# Patient Record
Sex: Female | Born: 1935 | Race: White | Hispanic: No | Marital: Married | Smoking: Former smoker
Health system: Southern US, Community
[De-identification: ages and names within clinical notes are randomized; demographics above are authoritative.]

## PROBLEM LIST (undated history)

## (undated) DIAGNOSIS — J449 Chronic obstructive pulmonary disease, unspecified: Secondary | ICD-10-CM

## (undated) DIAGNOSIS — R079 Chest pain, unspecified: Secondary | ICD-10-CM

## (undated) DIAGNOSIS — Z9889 Other specified postprocedural states: Secondary | ICD-10-CM

## (undated) DIAGNOSIS — I10 Essential (primary) hypertension: Secondary | ICD-10-CM

## (undated) DIAGNOSIS — I4892 Unspecified atrial flutter: Secondary | ICD-10-CM

## (undated) DIAGNOSIS — G250 Essential tremor: Secondary | ICD-10-CM

## (undated) DIAGNOSIS — J189 Pneumonia, unspecified organism: Secondary | ICD-10-CM

## (undated) DIAGNOSIS — E785 Hyperlipidemia, unspecified: Secondary | ICD-10-CM

## (undated) DIAGNOSIS — R9389 Abnormal findings on diagnostic imaging of other specified body structures: Secondary | ICD-10-CM

## (undated) DIAGNOSIS — R109 Unspecified abdominal pain: Secondary | ICD-10-CM

## (undated) DIAGNOSIS — R112 Nausea with vomiting, unspecified: Secondary | ICD-10-CM

## (undated) HISTORY — DX: Hyperlipidemia, unspecified: E78.5

## (undated) HISTORY — DX: Chronic obstructive pulmonary disease, unspecified: J44.9

## (undated) HISTORY — DX: Unspecified abdominal pain: R10.9

## (undated) HISTORY — PX: TUBAL LIGATION: SHX77

## (undated) HISTORY — DX: Abnormal findings on diagnostic imaging of other specified body structures: R93.89

## (undated) HISTORY — DX: Essential tremor: G25.0

## (undated) HISTORY — PX: EYE SURGERY: SHX253

## (undated) HISTORY — PX: TONSILLECTOMY: SUR1361

## (undated) HISTORY — PX: CATARACT EXTRACTION: SUR2

## (undated) HISTORY — DX: Chest pain, unspecified: R07.9

## (undated) HISTORY — PX: TONSILLECTOMY: SHX5217

## (undated) HISTORY — DX: Unspecified atrial flutter: I48.92

## (undated) HISTORY — DX: Essential (primary) hypertension: I10

---

## 1998-01-18 ENCOUNTER — Ambulatory Visit (HOSPITAL_COMMUNITY): Admission: RE | Admit: 1998-01-18 | Discharge: 1998-01-18 | Payer: Self-pay | Admitting: Cardiology

## 2006-05-17 ENCOUNTER — Ambulatory Visit: Payer: Self-pay | Admitting: Internal Medicine

## 2006-05-21 ENCOUNTER — Ambulatory Visit: Payer: Self-pay | Admitting: Internal Medicine

## 2006-05-23 ENCOUNTER — Ambulatory Visit: Payer: Self-pay | Admitting: Cardiology

## 2006-06-13 ENCOUNTER — Ambulatory Visit: Payer: Self-pay | Admitting: Internal Medicine

## 2006-07-27 ENCOUNTER — Other Ambulatory Visit: Admission: RE | Admit: 2006-07-27 | Discharge: 2006-07-27 | Payer: Self-pay | Admitting: Diagnostic Radiology

## 2006-10-11 ENCOUNTER — Ambulatory Visit: Payer: Self-pay | Admitting: Internal Medicine

## 2007-02-08 ENCOUNTER — Ambulatory Visit: Payer: Self-pay | Admitting: Internal Medicine

## 2007-05-24 DIAGNOSIS — I4892 Unspecified atrial flutter: Secondary | ICD-10-CM

## 2007-05-24 DIAGNOSIS — R930 Abnormal findings on diagnostic imaging of skull and head, not elsewhere classified: Secondary | ICD-10-CM | POA: Insufficient documentation

## 2007-05-24 DIAGNOSIS — J45909 Unspecified asthma, uncomplicated: Secondary | ICD-10-CM | POA: Insufficient documentation

## 2007-05-24 DIAGNOSIS — J449 Chronic obstructive pulmonary disease, unspecified: Secondary | ICD-10-CM

## 2007-07-26 ENCOUNTER — Ambulatory Visit: Payer: Self-pay | Admitting: Internal Medicine

## 2008-01-24 ENCOUNTER — Ambulatory Visit: Payer: Self-pay | Admitting: Internal Medicine

## 2008-07-22 ENCOUNTER — Ambulatory Visit: Payer: Self-pay | Admitting: Internal Medicine

## 2008-10-16 ENCOUNTER — Encounter: Payer: Self-pay | Admitting: Internal Medicine

## 2009-01-08 ENCOUNTER — Emergency Department (HOSPITAL_COMMUNITY): Admission: EM | Admit: 2009-01-08 | Discharge: 2009-01-08 | Payer: Self-pay | Admitting: Emergency Medicine

## 2009-01-17 ENCOUNTER — Ambulatory Visit: Payer: Self-pay | Admitting: Diagnostic Radiology

## 2009-01-17 ENCOUNTER — Encounter: Payer: Self-pay | Admitting: Internal Medicine

## 2009-01-18 ENCOUNTER — Inpatient Hospital Stay (HOSPITAL_COMMUNITY): Admission: EM | Admit: 2009-01-18 | Discharge: 2009-01-21 | Payer: Self-pay | Admitting: Cardiovascular Disease

## 2009-01-19 ENCOUNTER — Encounter (INDEPENDENT_AMBULATORY_CARE_PROVIDER_SITE_OTHER): Payer: Self-pay | Admitting: Cardiology

## 2009-01-19 DIAGNOSIS — I4892 Unspecified atrial flutter: Secondary | ICD-10-CM

## 2009-01-19 DIAGNOSIS — R079 Chest pain, unspecified: Secondary | ICD-10-CM

## 2009-01-19 HISTORY — DX: Chest pain, unspecified: R07.9

## 2009-01-19 HISTORY — DX: Unspecified atrial flutter: I48.92

## 2009-01-26 ENCOUNTER — Ambulatory Visit: Payer: Self-pay | Admitting: Internal Medicine

## 2009-05-12 ENCOUNTER — Telehealth: Payer: Self-pay | Admitting: Internal Medicine

## 2009-05-12 ENCOUNTER — Ambulatory Visit: Payer: Self-pay | Admitting: Internal Medicine

## 2009-05-26 ENCOUNTER — Ambulatory Visit: Payer: Self-pay | Admitting: Internal Medicine

## 2009-09-02 ENCOUNTER — Encounter: Payer: Self-pay | Admitting: Internal Medicine

## 2009-11-22 ENCOUNTER — Ambulatory Visit: Payer: Self-pay | Admitting: Internal Medicine

## 2010-04-18 ENCOUNTER — Emergency Department (HOSPITAL_COMMUNITY): Admission: EM | Admit: 2010-04-18 | Discharge: 2010-04-18 | Payer: Self-pay | Admitting: Emergency Medicine

## 2010-05-20 ENCOUNTER — Encounter: Payer: Self-pay | Admitting: Internal Medicine

## 2010-05-23 ENCOUNTER — Ambulatory Visit: Payer: Self-pay | Admitting: Internal Medicine

## 2010-05-23 DIAGNOSIS — J310 Chronic rhinitis: Secondary | ICD-10-CM

## 2010-05-30 ENCOUNTER — Ambulatory Visit: Payer: Self-pay | Admitting: Internal Medicine

## 2010-05-30 DIAGNOSIS — R0602 Shortness of breath: Secondary | ICD-10-CM

## 2010-07-04 ENCOUNTER — Ambulatory Visit: Payer: Self-pay | Admitting: Internal Medicine

## 2010-09-13 NOTE — Letter (Signed)
Summary: [facility]  [facility]   Imported By: Sherian Rein 05/30/2010 14:56:09  _____________________________________________________________________  External Attachment:    Type:   Image     Comment:   External Document

## 2010-09-13 NOTE — Assessment & Plan Note (Signed)
Summary: 6 months/apc   Primary Provider/Referring Provider:  [facility]  CC:  6 month follow up visit -COPD;SOB with activity..  History of Present Illness:  May 26, 2009- Asthma/ COPD, Hx AF Seeing Dr Caprice Kluver for AF, seeing Dr Thad Ranger neuro for tremor. We discussed lung meds- only her Proair might cause tremor, but she uses it infrequently. Denies cough, wheeze, wheeze, chest pain or sinus congestion. Little palpitation on meds. Dyspnea with housework. It helps her to use a buggy at grocery store. Had flu vax.  November 22, 2009- Asthma/ COPD, Hx AF Got through the winter wel with no Flu this year. She couldn't tolerate amiodarone, diltiazem or Multaq for PAF. She has lopressor to take as needed- not recently needed. Goes weeks without needing rescue albuterol, then may need it a couple of days in a row without relation to weather. Dyspnea isn't limiting as she paces herself. Little dry cough. Handicapped parking permanent.  May 23, 2010- Asthma/ COPD, Hx AF cc:6 month follow up visit -COPD;SOB with activity. Nose runs more with cooler weather. Denies change in persistent dyspnea with exertion , cleaning house. No different over past 6 months. Discussed pneumovax- had once 3 years ago. I suggested one booster at 5 years. Had to go to ER once Sept 1, 2011 for palpitations w/ her AF. They let her go w/out change and she has followed up with Dr little since. Had CXR at ER "fine". Had flu vax.   Asthma History    Initial Asthma Severity Rating:    Age range: 12+ years    Symptoms: 0-2 days/week    Nighttime Awakenings: 0-2/month    Interferes w/ normal activity: some limitations    SABA use (not for EIB): 0-2 days/week    Asthma Severity Assessment: Moderate Persistent   Preventive Screening-Counseling & Management  Alcohol-Tobacco     Smoking Status: quit     Year Quit: 1989     Pack years: 30  Current Medications (verified): 1)  Lopressor 50 Mg  Tabs  (Metoprolol Tartrate) .... Take 1/2  By Mouth Once Daily 2)  Adult Aspirin Low Strength 81 Mg  Tbdp (Aspirin) .... Take 1 Tablet By Mouth Once A Day 3)  Cozaar 100 Mg Tabs (Losartan Potassium) .... Take 1/2  Tablet By Mouth Once A Day 4)  Spiriva Handihaler 18 Mcg  Caps (Tiotropium Bromide Monohydrate) .... Inhale Contents of 1 Capsule Once A Day 5)  Proair Hfa 108 (90 Base) Mcg/act  Aers (Albuterol Sulfate) .Marland Kitchen.. 1-2 Puffs Every 4-6 Hours As Needed 6)  Warfarin Sodium 1 Mg Tabs (Warfarin Sodium) .... Take As Directed 7)  Clonazepam 0.5 Mg Tabs (Clonazepam) .... Take 1/2 To 1 Two Times A Day As Needed 8)  Metoprolol Tartrate 50 Mg Tabs (Metoprolol Tartrate) .... Take 1/2 By Mouth  Every Morning 9)  Alprazolam 0.25 Mg Tabs (Alprazolam) .... Take 1 By Mouth Two Times A Day For Tremors  Allergies (verified): 1)  ! Augmentin 2)  ! Codeine 3)  ! Biaxin 4)  ! Macrobid 5)  ! Crestor (Rosuvastatin Calcium) 6)  ! Amiodarone Hcl 7)  ! * Multaq 8)  ! Diltiazem Hcl Er Beads 9)  ! * Clonazepam  Past History:  Past Medical History: Last updated: 01/26/2009  ATRIAL Fib  FLUTTER, PAROXYSMAL (ICD-427.32) ABNORMAL CHEST XRAY (ICD-793.1) ASTHMA (ICD-493.90) COPD (ICD-496)  Past Surgical History: Last updated: 25-Jan-2008 Tonsillectomy cataracts tubal ligation  Family History: Last updated: 01-25-2008 Mother-deceased age 70; bone cancer Father-deceased  age 32; bone and lung caner Sister-living age 27; hypertension; high chol Sister- living age 90; breast cancer  Social History: Last updated: 01/24/2008 Retired Patient states former smoker.  Quit in 1989 Married with 3 children.  Risk Factors: Smoking Status: quit (05/23/2010)  Review of Systems      See HPI       The patient complains of shortness of breath with activity, irregular heartbeats, nasal congestion/difficulty breathing through nose, and sneezing.  The patient denies shortness of breath at rest, productive cough,  non-productive cough, coughing up blood, chest pain, acid heartburn, indigestion, loss of appetite, weight change, abdominal pain, difficulty swallowing, sore throat, tooth/dental problems, headaches, itching, ear ache, rash, and change in color of mucus.    Vital Signs:  Patient profile:   77 year old female Height:      62 inches Weight:      155.25 pounds BMI:     28.50 O2 Sat:      97 % on Room air Pulse rate:   76 / minute BP sitting:   126 / 80  (left arm) Cuff size:   regular  Vitals Entered By: Reynaldo Minium CMA (May 23, 2010 9:25 AM)  O2 Flow:  Room air CC: 6 month follow up visit -COPD;SOB with activity.   Physical Exam  Additional Exam:  General: A/Ox3; pleasant and cooperative, NAD, SKIN: no rash, lesions NODES: no lymphadenopathy HEENT: /AT, EOM- WNL, Conjuctivae- clear, PERRLA, TM-WNL, Nose- clear- external deviation to R, since childhood, Throat- clear and wnl, dentures, Mallampati II NECK: Supple w/ fair ROM, JVD- none, normal carotid impulses w/o bruits Thyroid- normal to palpation CHEST: Clear to P&A, decreased but quiet, no accessory muscles or increased effort. HEART: RRR, no m/g/r heard, pulse is regular again today ABDOMEN: Soft and nl; ZOX:WRUE, nl pulses, no edema  NEURO: little tremor today       Impression & Recommendations:  Problem # 1:  RHINITIS (ICD-472.0)  Mild rhinorhea may be seasonal allergy or vasomotor from temprature change. Will use antihistamines. External deviation from childhood injury- noted for observational comment only.  Problem # 2:  COPD (ICD-496) COPD with asthma under good control. O2 sat is 97% at rest. Dyspnea is from COPD and she is encouraged to maintain stamina through regular exercise. We will order PFT. Contrasted pulmonary and cardiac dyspnea.   Problem # 3:  ATRIAL FLUTTER, PAROXYSMAL (ICD-427.32) Sinus rhythm by exam today . We discussed cardiac vs pulmonary dyspnea clues. Her updated medication list for  this problem includes:    Lopressor 50 Mg Tabs (Metoprolol tartrate) .Marland Kitchen... Take 1/2  by mouth once daily    Adult Aspirin Low Strength 81 Mg Tbdp (Aspirin) .Marland Kitchen... Take 1 tablet by mouth once a day    Warfarin Sodium 1 Mg Tabs (Warfarin sodium) .Marland Kitchen... Take as directed    Metoprolol Tartrate 50 Mg Tabs (Metoprolol tartrate) .Marland Kitchen... Take 1/2 by mouth  every morning  Medications Added to Medication List This Visit: 1)  Warfarin Sodium 1 Mg Tabs (Warfarin sodium) .... Take as directed 2)  Metoprolol Tartrate 50 Mg Tabs (Metoprolol tartrate) .... Take 1/2 by mouth  every morning 3)  Alprazolam 0.25 Mg Tabs (Alprazolam) .... Take 1 by mouth two times a day for tremors  Other Orders: Est. Patient Level IV (45409) Misc. Referral (Misc. Ref)  Patient Instructions: 1)  Please schedule a follow-up appointment in 1 month. 2)  See [facility] to schedule PFT and 6 MWT 3)  For runny nose, try  otc antihistamine Claritin/ loratadine 4)  Med refill 5)  cc Dr Clarene Duke Prescriptions: PROAIR HFA 108 (90 BASE) MCG/ACT  AERS (ALBUTEROL SULFATE) 1-2 puffs every 4-6 hours as needed  #8.5 Gram x prn   Entered and Authorized by:   Waymon Budge MD   Signed by:   Waymon Budge MD on 05/23/2010   Method used:   Print then Give to Patient   RxID:   [id number] SPIRIVA HANDIHALER 18 MCG  CAPS (TIOTROPIUM BROMIDE MONOHYDRATE) Inhale contents of 1 capsule once a day  #30 Capsule x prn   Entered and Authorized by:   Waymon Budge MD   Signed by:   Waymon Budge MD on 05/23/2010   Method used:   Print then Give to Patient   RxID:   [id number]

## 2010-09-13 NOTE — Assessment & Plan Note (Signed)
Summary: SIX MIN WALK-PULM STRESS TEST  Nurse Visit   Vital Signs:  Patient profile:   77 year old female Pulse rate:   79 / minute BP sitting:   134 / 80  Medications Prior to Update: 1)  Lopressor 50 Mg  Tabs (Metoprolol Tartrate) .... Take 1/2  By Mouth Once Daily 2)  Adult Aspirin Low Strength 81 Mg  Tbdp (Aspirin) .... Take 1 Tablet By Mouth Once A Day 3)  Cozaar 100 Mg Tabs (Losartan Potassium) .... Take 1/2  Tablet By Mouth Once A Day 4)  Spiriva Handihaler 18 Mcg  Caps (Tiotropium Bromide Monohydrate) .... Inhale Contents of 1 Capsule Once A Day 5)  Proair Hfa 108 (90 Base) Mcg/act  Aers (Albuterol Sulfate) .Marland Kitchen.. 1-2 Puffs Every 4-6 Hours As Needed 6)  Warfarin Sodium 1 Mg Tabs (Warfarin Sodium) .... Take As Directed 7)  Clonazepam 0.5 Mg Tabs (Clonazepam) .... Take 1/2 To 1 Two Times A Day As Needed 8)  Metoprolol Tartrate 50 Mg Tabs (Metoprolol Tartrate) .... Take 1/2 By Mouth  Every Morning 9)  Alprazolam 0.25 Mg Tabs (Alprazolam) .... Take 1 By Mouth Two Times A Day For Tremors  Allergies: 1)  ! Augmentin 2)  ! Codeine 3)  ! Biaxin 4)  ! Macrobid 5)  ! Crestor (Rosuvastatin Calcium) 6)  ! Amiodarone Hcl 7)  ! * Multaq 8)  ! Diltiazem Hcl Er Beads 9)  ! * Clonazepam  Orders Added: 1)  Pulmonary Stress (6 min walk) [94620]   Six Minute Walk Test Medications taken before test(dose and time): 1)  ! Augmentin 2)  ! Codeine 3)  ! Biaxin 4)  ! Macrobid 5)  ! Crestor (Rosuvastatin Calcium) 6)  ! Amiodarone Hcl 7)  ! * Multaq 8)  ! Diltiazem Hcl Er Beads 9)  ! * Clonazepam This med list doesn't look complete- However, pt states that she took all am meds at 7:30 today- nurse will update list later.  Supplemental oxygen during the test: No  Lap counter(place a tick mark inside a square for each lap completed) lap 1 complete  lap 2 complete   lap 3 complete   lap 4 complete  lap 5 complete  lap 6 complete  lap 7 complete    Baseline  BP sitting: 134/  80 Heart rate: 79 Dyspnea ( Borg scale) 2 Fatigue (Borg scale) 0 SPO2 98  End Of Test  BP sitting: 140/ 84 Heart rate: 118 Dyspnea ( Borg scale) 4 Fatigue (Borg scale) 0 SPO2 90  2 Minutes post  BP sitting: 136/ 82 Heart rate: 85 SPO2 98  Stopped or paused before six minutes? No Other symptoms at end of exercise: Hip pain  Interpretation: Number of laps  7 X 48 meters =   336 meters =    336 meters   Total distance walked in six minutes: 336 meters  Tech ID: Tivis Ringer, CNA (May 30, 2010 12:36 PM) Tech Comments Pt completed test w/ 0 rest breaks and 1 complain: hip pain that pt states is "normal" when walking.     Appended Document: SIX MIN WALK-PULM STRESS TEST MED LIST IS COMPLETE. {USER.REALNAME}  {DATETIMESTAMP()}

## 2010-09-13 NOTE — Assessment & Plan Note (Signed)
Summary: 6 months/apc   Primary Provider/Referring Provider:  [facility]  CC:  6 month follow up visit-doing good. Marland Kitchen  History of Present Illness: 01/26/09- Asthma/ COPD Went to [facility] ER 01/08/09  for dyspnea with sat of 80%, responding to nebs. She blames Macrobid for that and off the med she has been fine. She went to [facility] ER in [place] 6/7 with "indigestion" dx'd as new onset atrial fibrillation now converted and covered with Warfarin, also Multaq and Crestor. Prior hx AFlutter without hx MI or CHF.  May 26, 2009- Asthma/ COPD, Hx AF Seeing Dr Caprice Kluver for AF, seeing Dr Thad Ranger neuro for tremor. We discussed lung meds- only her Proair might cause tremor, but she uses it infrequently. Denies cough, wheeze, wheeze, chest pain or sinus congestion. Little palpitation on meds. Dyspnea with housework. It helps her to use a buggy at grocery store. Had flu vax.  November 22, 2009- Asthma/ COPD, Hx AF Got through the winter wel with no Flu this year. She couldn't tolerate amiodarone, diltiazem or Multaq for PAF. She has lopressor to take as needed- not recently needed. Goes weeks without needing rescue albuterol, then may need it a couple of days in a row without relation to weather. Dyspnea isn't limiting as she paces herself. Little dry cough. Handicapped parking permanent.   Current Medications (verified): 1)  Paxil 20 Mg  Tabs (Paroxetine Hcl) .... Take 1 Tablet By Mouth Once A Day 2)  Lopressor 50 Mg  Tabs (Metoprolol Tartrate) .... Take 1/2  By Mouth Once Daily 3)  Adult Aspirin Low Strength 81 Mg  Tbdp (Aspirin) .... Take 1 Tablet By Mouth Once A Day 4)  Cozaar 100 Mg Tabs (Losartan Potassium) .... Take 1/2  Tablet By Mouth Once A Day 5)  Spiriva Handihaler 18 Mcg  Caps (Tiotropium Bromide Monohydrate) .... Inhale Contents of 1 Capsule Once A Day 6)  Proair Hfa 108 (90 Base) Mcg/act  Aers (Albuterol Sulfate) .Marland Kitchen.. 1-2 Puffs Every 4-6 Hours As Needed 7)  Prilosec 20  Mg Cpdr (Omeprazole) .... Take 1 By Mouth Once Daily 8)  Warfarin Sodium 1 Mg Tabs (Warfarin Sodium) .... Take 1 By Mouth Once Daily Except On Mon and Fri Take 2 9)  Clonazepam 0.5 Mg Tabs (Clonazepam) .... Take 1/2 To 1 Two Times A Day As Needed  Allergies: 1)  ! Augmentin 2)  ! Codeine 3)  ! Biaxin 4)  ! Macrobid 5)  ! Crestor (Rosuvastatin Calcium) 6)  ! Amiodarone Hcl 7)  ! * Multaq 8)  ! Diltiazem Hcl Er Beads  Past History:  Past Medical History: Last updated: 01/26/2009  ATRIAL Fib  FLUTTER, PAROXYSMAL (ICD-427.32) ABNORMAL CHEST XRAY (ICD-793.1) ASTHMA (ICD-493.90) COPD (ICD-496)  Past Surgical History: Last updated: 02-02-2008 Tonsillectomy cataracts tubal ligation  Family History: Last updated: 2008/02/02 Mother-deceased age 35; bone cancer Father-deceased age 35; bone and lung caner Sister-living age 62; hypertension; high chol Sister- living age 32; breast cancer  Social History: Last updated: February 02, 2008 Retired Patient states former smoker.  Quit in 1989 Married with 3 children.  Risk Factors: Smoking Status: quit (02-02-08)  Review of Systems      See HPI       The patient complains of dyspnea on exertion.  The patient denies anorexia, fever, weight loss, weight gain, vision loss, decreased hearing, hoarseness, chest pain, syncope, peripheral edema, prolonged cough, headaches, hemoptysis, abdominal pain, and severe indigestion/heartburn.    Vital Signs:  Patient profile:   77  year old female Height:      62 inches Weight:      160 pounds BMI:     29.37 O2 Sat:      96 % on Room air Pulse rate:   70 / minute BP sitting:   146 / 84  (left arm) Cuff size:   regular  Vitals Entered By: Reynaldo Minium CMA (November 22, 2009 9:24 AM)  O2 Flow:  Room air  Physical Exam  Additional Exam:  General: A/Ox3; pleasant and cooperative, NAD, SKIN: no rash, lesions NODES: no lymphadenopathy HEENT: [place]/AT, EOM- WNL, Conjuctivae- clear, PERRLA, TM-WNL,  Nose- clear, Throat- clear and wnl, dentures, Mallampati II NECK: Supple w/ fair ROM, JVD- none, normal carotid impulses w/o bruits Thyroid- normal to palpation CHEST: Clear to P&A, decreased but quiet, no accessory muscles or increased effort. HEART: RRR, no m/g/r heard, pulse is regular today ABDOMEN: Soft and nl; ZOX:WRUE, nl pulses, no edema  NEURO: little tremor today      Impression & Recommendations:  Problem # 1:  COPD (ICD-496) Good control with no acute issues this spring so far. Pollen isn't bothering her.  Problem # 2:  ABNORMAL CHEST XRAY (ICD-793.1)  CXR 01/17/09 had shown probable scaring in RUL, NAD. We agreed to wait and repeat at next visit.  Orders: Est. Patient Level III (45409)  Medications Added to Medication List This Visit: 1)  Lopressor 50 Mg Tabs (Metoprolol tartrate) .... Take 1/2  by mouth once daily  Patient Instructions: 1)  Please schedule a follow-up appointment in 6 months. 2)  Please call earlier if needed. 3)  Handicapped parking

## 2010-09-13 NOTE — Miscellaneous (Signed)
Summary: Orders Update pft charges  Clinical Lists Changes  Orders: Added new Service order of Carbon Monoxide diffusing w/capacity (94720) - Signed Added new Service order of Lung Volumes (94240) - Signed Added new Service order of Spirometry (Pre & Post) (94060) - Signed 

## 2010-09-13 NOTE — Letter (Signed)
Summary: [facility]  [facility]   Imported By: Sherian Rein 09/13/2009 08:17:42  _____________________________________________________________________  External Attachment:    Type:   Image     Comment:   External Document

## 2010-09-13 NOTE — Assessment & Plan Note (Signed)
Summary: 1 MONTH/ MBW   Primary Provider/Referring Provider:  [facility]  CC:  1 month follow up visit-COPD; review PFT and results..  History of Present Illness:  November 22, 2009- Asthma/ COPD, Hx AF Got through the winter wel with no Flu this year. She couldn't tolerate amiodarone, diltiazem or Multaq for PAF. She has lopressor to take as needed- not recently needed. Goes weeks without needing rescue albuterol, then may need it a couple of days in a row without relation to weather. Dyspnea isn't limiting as she paces herself. Little dry cough. Handicapped parking permanent.  May 23, 2010- Asthma/ COPD, Hx AF cc:6 month follow up visit -COPD;SOB with activity. Nose runs more with cooler weather. Denies change in persistent dyspnea with exertion , cleaning house. No different over past 6 months. Discussed pneumovax- had once 3 years ago. I suggested one booster at 5 years. Had to go to ER once Sept 1, 2011 for palpitations w/ her AF. They let her go w/out change and she has followed up with Dr little since. Had CXR at ER "fine". Had flu vax.  July 04, 2010-  Asthma/ COPD, Hx AF Nurse-CC: 1 month follow up visit-COPD; review PFT and results. Had flu vax. Deneies acute events or changes since last here.  PFT- Moderate obstruction, air trapping, severley reduced DLCO, slight response to dilator. FEV1 1.00/ 0.57; FVC 1.89/ 0.76; RV 135%; DLCO 42% 6 MWT- 98%, 90%, 98% 336 m  Last CXR showed scarring but no interstitial disease to reduce diffusion.    Preventive Screening-Counseling & Management  Alcohol-Tobacco     Smoking Status: quit     Year Quit: 1989     Pack years: 30  Current Medications (verified): 1)  Lopressor 50 Mg  Tabs (Metoprolol Tartrate) .... Take 1/2  By Mouth Once Daily 2)  Adult Aspirin Low Strength 81 Mg  Tbdp (Aspirin) .... Take 1 Tablet By Mouth Once A Day 3)  Cozaar 100 Mg Tabs (Losartan Potassium) .... Take 1/2  Tablet By Mouth Once  A Day 4)  Spiriva Handihaler 18 Mcg  Caps (Tiotropium Bromide Monohydrate) .... Inhale Contents of 1 Capsule Once A Day 5)  Proair Hfa 108 (90 Base) Mcg/act  Aers (Albuterol Sulfate) .Marland Kitchen.. 1-2 Puffs Every 4-6 Hours As Needed 6)  Warfarin Sodium 1 Mg Tabs (Warfarin Sodium) .... Take As Directed 7)  Metoprolol Tartrate 50 Mg Tabs (Metoprolol Tartrate) .... Take 1/2 By Mouth  Every Morning 8)  Alprazolam 0.25 Mg Tabs (Alprazolam) .... Take 1 By Mouth Two Times A Day For Tremors  Allergies (verified): 1)  ! Augmentin 2)  ! Codeine 3)  ! Biaxin 4)  ! Macrobid 5)  ! Crestor (Rosuvastatin Calcium) 6)  ! Amiodarone Hcl 7)  ! * Multaq 8)  ! Diltiazem Hcl Er Beads 9)  ! * Clonazepam  Past History:  Past Surgical History: Last updated: January 28, 2008 Tonsillectomy cataracts tubal ligation  Family History: Last updated: 2008/01/28 Mother-deceased age 70; bone cancer Father-deceased age 35; bone and lung caner Sister-living age 55; hypertension; high chol Sister- living age 60; breast cancer  Social History: Last updated: 01/28/08 Retired Patient states former smoker.  Quit in 1989 Married with 3 children.  Risk Factors: Smoking Status: quit (07/04/2010)  Past Medical History:  ATRIAL Fib  FLUTTER, PAROXYSMAL (ICD-427.32) ABNORMAL CHEST XRAY (ICD-793.1) COPD (ICD-496) PFT 05/30/10- FEV1 1.00/ 0.57; FVC 1.89/ 0.76; R 0.53; RV 135%; DLCO 42%  Review of Systems  See HPI       The patient complains of shortness of breath at rest.  The patient denies shortness of breath with activity, productive cough, non-productive cough, coughing up blood, chest pain, irregular heartbeats, acid heartburn, indigestion, loss of appetite, weight change, abdominal pain, difficulty swallowing, sore throat, tooth/dental problems, headaches, nasal congestion/difficulty breathing through nose, sneezing, itching, ear ache, hand/feet swelling, rash, and change in color of mucus.    Vital  Signs:  Patient profile:   77 year old female Height:      62 inches Weight:      158.38 pounds BMI:     29.07 O2 Sat:      93 % on Room air Pulse rate:   104 / minute BP sitting:   180 / 88  (left arm) Cuff size:   regular  Vitals Entered By: Reynaldo Minium CMA (July 04, 2010 9:33 AM)  O2 Flow:  Room air CC: 1 month follow up visit-COPD; review PFT and results.   Physical Exam  Additional Exam:  General: A/Ox3; pleasant and cooperative, NAD, SKIN: no rash, lesions NODES: no lymphadenopathy HEENT: [place]/AT, EOM- WNL, Conjuctivae- clear, PERRLA, TM-WNL, Nose- clear- external deviation to R, since childhood, Throat- clear and wnl, dentures, Mallampati II NECK: Supple w/ fair ROM, JVD- none, normal carotid impulses w/o bruits Thyroid- normal to palpation CHEST: Clear to P&A, decreased but quiet, no accessory muscles or increased effort. HEART: RRR, no m/g/r heard, pulse is regular again today ABDOMEN: Soft and nl; YWV:PXTG, nl pulses, no edema  NEURO: little tremor today       Impression & Recommendations:  Problem # 1:  COPD (ICD-496) Moderately severe COPD, mostly emphysema pattern. Minimal reversibility . Already on Spiriva. We can encourage walking. She has had flu vax. Has had the pneumovax in past. She expects to pace hersef. Eventually she may want to consider portable oxygen, but not needed yet.  I've encouraged regular walking as tolerated.  We went over the PFT numbers together.   Problem # 2:  ATRIAL FLUTTER, PAROXYSMAL (ICD-427.32)  She has been intolerant to some meds tried by Dr Clarene Duke. We discussed potential effect of her cardiac meds on breathing, but I don't see problems now.  Her updated medication list for this problem includes:    Lopressor 50 Mg Tabs (Metoprolol tartrate) .Marland Kitchen... Take 1/2  by mouth once daily    Adult Aspirin Low Strength 81 Mg Tbdp (Aspirin) .Marland Kitchen... Take 1 tablet by mouth once a day    Warfarin Sodium 1 Mg Tabs (Warfarin sodium) .Marland Kitchen...  Take as directed    Metoprolol Tartrate 50 Mg Tabs (Metoprolol tartrate) .Marland Kitchen... Take 1/2 by mouth  every morning  Orders: Est. Patient Level IV (62694)  Patient Instructions: 1)  Please schedule a follow-up appointment in 6 months. 2)  Continue Spiriva

## 2010-09-18 ENCOUNTER — Emergency Department (HOSPITAL_COMMUNITY)
Admission: EM | Admit: 2010-09-18 | Discharge: 2010-09-18 | Disposition: A | Discharge status: Home / Self Care | Payer: Medicare Other | Attending: Emergency Medicine | Admitting: Emergency Medicine

## 2010-09-18 ENCOUNTER — Encounter (HOSPITAL_COMMUNITY): Payer: Self-pay | Admitting: Radiology

## 2010-09-18 ENCOUNTER — Emergency Department (HOSPITAL_COMMUNITY): Payer: Medicare Other

## 2010-09-18 DIAGNOSIS — R000 Tachycardia, unspecified: Secondary | ICD-10-CM | POA: Insufficient documentation

## 2010-09-18 DIAGNOSIS — E7800 Pure hypercholesterolemia, unspecified: Secondary | ICD-10-CM | POA: Insufficient documentation

## 2010-09-18 DIAGNOSIS — I10 Essential (primary) hypertension: Secondary | ICD-10-CM | POA: Insufficient documentation

## 2010-09-18 DIAGNOSIS — J449 Chronic obstructive pulmonary disease, unspecified: Secondary | ICD-10-CM | POA: Insufficient documentation

## 2010-09-18 DIAGNOSIS — Z79899 Other long term (current) drug therapy: Secondary | ICD-10-CM | POA: Insufficient documentation

## 2010-09-18 DIAGNOSIS — I4891 Unspecified atrial fibrillation: Secondary | ICD-10-CM | POA: Insufficient documentation

## 2010-09-18 DIAGNOSIS — Z7982 Long term (current) use of aspirin: Secondary | ICD-10-CM | POA: Insufficient documentation

## 2010-09-18 DIAGNOSIS — J4489 Other specified chronic obstructive pulmonary disease: Secondary | ICD-10-CM | POA: Insufficient documentation

## 2010-09-18 DIAGNOSIS — R002 Palpitations: Secondary | ICD-10-CM | POA: Insufficient documentation

## 2010-09-18 LAB — DIFFERENTIAL
Basophils Absolute: 0.1 10*3/uL (ref 0.0–0.1)
Basophils Relative: 1 % (ref 0–1)
Eosinophils Absolute: 0.1 10*3/uL (ref 0.0–0.7)
Monocytes Relative: 8 % (ref 3–12)
Neutro Abs: 4.3 10*3/uL (ref 1.7–7.7)
Neutrophils Relative %: 52 % (ref 43–77)

## 2010-09-18 LAB — BASIC METABOLIC PANEL
CO2: 25 mEq/L (ref 19–32)
Chloride: 104 mEq/L (ref 96–112)
GFR calc Af Amer: 51 mL/min — ABNORMAL LOW (ref >60)
Potassium: 3.7 mEq/L (ref 3.5–5.1)
Sodium: 140 mEq/L (ref 135–145)

## 2010-09-18 LAB — CBC
Hemoglobin: 13.9 g/dL (ref 12.0–15.0)
Platelets: 196 10*3/uL (ref 150–400)
RBC: 5.04 MIL/uL (ref 3.87–5.11)
WBC: 8.3 10*3/uL (ref 4.0–10.5)

## 2010-09-18 LAB — MAGNESIUM: Magnesium: 2.3 mg/dL (ref 1.5–2.5)

## 2010-09-18 LAB — POCT CARDIAC MARKERS
Myoglobin, poc: 80.8 ng/mL (ref 12–200)
Troponin i, poc: <0.05 ng/mL (ref 0.00–0.09)
Troponin i, poc: <0.05 ng/mL (ref 0.00–0.09)

## 2010-09-18 LAB — CK TOTAL AND CKMB (NOT AT ARMC)
Relative Index: INVALID (ref 0.0–2.5)
Total CK: 82 U/L (ref 7–177)

## 2010-09-18 LAB — TROPONIN I: Troponin I: 0.03 ng/mL (ref 0.00–0.06)

## 2010-09-18 LAB — PROTIME-INR: Prothrombin Time: 22.5 seconds — ABNORMAL HIGH (ref 11.6–15.2)

## 2010-10-27 LAB — POCT CARDIAC MARKERS
CKMB, poc: <1 ng/mL — ABNORMAL LOW (ref 1.0–8.0)
CKMB, poc: <1 ng/mL — ABNORMAL LOW (ref 1.0–8.0)
Myoglobin, poc: 58.8 ng/mL (ref 12–200)
Myoglobin, poc: 70.3 ng/mL (ref 12–200)
Troponin i, poc: <0.05 ng/mL (ref 0.00–0.09)
Troponin i, poc: <0.05 ng/mL (ref 0.00–0.09)

## 2010-10-27 LAB — CBC
HCT: 41.7 % (ref 36.0–46.0)
Hemoglobin: 13.8 g/dL (ref 12.0–15.0)
MCH: 28 pg (ref 26.0–34.0)
MCHC: 33.1 g/dL (ref 30.0–36.0)
MCV: 84.6 fL (ref 78.0–100.0)
Platelets: 161 10*3/uL (ref 150–400)
RBC: 4.93 MIL/uL (ref 3.87–5.11)
RDW: 14.1 % (ref 11.5–15.5)
WBC: 5.8 10*3/uL (ref 4.0–10.5)

## 2010-10-27 LAB — BASIC METABOLIC PANEL
CO2: 30 mEq/L (ref 19–32)
Chloride: 104 mEq/L (ref 96–112)
GFR calc Af Amer: 51 mL/min — ABNORMAL LOW (ref >60)
Glucose, Bld: 114 mg/dL — ABNORMAL HIGH (ref 70–99)
Sodium: 141 mEq/L (ref 135–145)

## 2010-10-27 LAB — BASIC METABOLIC PANEL WITH GFR
BUN: 11 mg/dL (ref 6–23)
Calcium: 9.4 mg/dL (ref 8.4–10.5)
Creatinine, Ser: 1.24 mg/dL — ABNORMAL HIGH (ref 0.4–1.2)
GFR calc non Af Amer: 42 mL/min — ABNORMAL LOW (ref >60)
Potassium: 4.1 meq/L (ref 3.5–5.1)

## 2010-10-27 LAB — DIFFERENTIAL
Basophils Absolute: 0.1 10*3/uL (ref 0.0–0.1)
Basophils Relative: 1 % (ref 0–1)
Eosinophils Absolute: 0.1 K/uL (ref 0.0–0.7)
Eosinophils Relative: 2 % (ref 0–5)
Lymphocytes Relative: 29 % (ref 12–46)
Lymphs Abs: 1.7 K/uL (ref 0.7–4.0)
Monocytes Absolute: 0.4 10*3/uL (ref 0.1–1.0)
Monocytes Relative: 7 % (ref 3–12)
Neutro Abs: 3.6 10*3/uL (ref 1.7–7.7)
Neutrophils Relative %: 61 % (ref 43–77)

## 2010-10-27 LAB — PROTIME-INR
INR: 2.26 — ABNORMAL HIGH (ref 0.00–1.49)
Prothrombin Time: 25.1 seconds — ABNORMAL HIGH (ref 11.6–15.2)

## 2010-11-21 LAB — POCT CARDIAC MARKERS
CKMB, poc: <1 ng/mL — ABNORMAL LOW (ref 1.0–8.0)
CKMB, poc: <1 ng/mL — ABNORMAL LOW (ref 1.0–8.0)
Myoglobin, poc: 46.6 ng/mL (ref 12–200)
Troponin i, poc: <0.05 ng/mL (ref 0.00–0.09)

## 2010-11-21 LAB — URINE CULTURE: Colony Count: 35000

## 2010-11-21 LAB — BASIC METABOLIC PANEL
BUN: 16 mg/dL (ref 6–23)
CO2: 27 mEq/L (ref 19–32)
Calcium: 9 mg/dL (ref 8.4–10.5)
Chloride: 109 mEq/L (ref 96–112)
GFR calc Af Amer: 45 mL/min — ABNORMAL LOW (ref >60)
GFR calc non Af Amer: 37 mL/min — ABNORMAL LOW (ref >60)
GFR calc non Af Amer: 41 mL/min — ABNORMAL LOW (ref >60)
Glucose, Bld: 103 mg/dL — ABNORMAL HIGH (ref 70–99)
Glucose, Bld: 96 mg/dL (ref 70–99)
Potassium: 4.7 mEq/L (ref 3.5–5.1)
Potassium: 5.4 mEq/L — ABNORMAL HIGH (ref 3.5–5.1)
Sodium: 140 mEq/L (ref 135–145)
Sodium: 142 mEq/L (ref 135–145)

## 2010-11-21 LAB — CARDIAC PANEL(CRET KIN+CKTOT+MB+TROPI)
CK, MB: 1.5 ng/mL (ref 0.3–4.0)
CK, MB: 1.9 ng/mL (ref 0.3–4.0)
Relative Index: INVALID (ref 0.0–2.5)
Relative Index: INVALID (ref 0.0–2.5)
Total CK: 58 U/L (ref 7–177)
Total CK: 61 U/L (ref 7–177)
Troponin I: 0.01 ng/mL (ref 0.00–0.06)
Troponin I: 0.01 ng/mL (ref 0.00–0.06)
Troponin I: 0.02 ng/mL (ref 0.00–0.06)

## 2010-11-21 LAB — CBC
HCT: 35.3 % — ABNORMAL LOW (ref 36.0–46.0)
HCT: 35.4 % — ABNORMAL LOW (ref 36.0–46.0)
HCT: 35.6 % — ABNORMAL LOW (ref 36.0–46.0)
Hemoglobin: 11.8 g/dL — ABNORMAL LOW (ref 12.0–15.0)
Hemoglobin: 11.9 g/dL — ABNORMAL LOW (ref 12.0–15.0)
MCHC: 33.5 g/dL (ref 30.0–36.0)
MCV: 84.4 fL (ref 78.0–100.0)
Platelets: 143 10*3/uL — ABNORMAL LOW (ref 150–400)
Platelets: 145 10*3/uL — ABNORMAL LOW (ref 150–400)
Platelets: 182 10*3/uL (ref 150–400)
RBC: 4.17 MIL/uL (ref 3.87–5.11)
RDW: 14.8 % (ref 11.5–15.5)
RDW: 15.3 % (ref 11.5–15.5)
RDW: 15.4 % (ref 11.5–15.5)
WBC: 6.6 10*3/uL (ref 4.0–10.5)
WBC: 6.8 10*3/uL (ref 4.0–10.5)
WBC: 9.4 10*3/uL (ref 4.0–10.5)

## 2010-11-21 LAB — COMPREHENSIVE METABOLIC PANEL
AST: 21 U/L (ref 0–37)
Albumin: 3 g/dL — ABNORMAL LOW (ref 3.5–5.2)
Albumin: 3.4 g/dL — ABNORMAL LOW (ref 3.5–5.2)
Alkaline Phosphatase: 95 U/L (ref 39–117)
BUN: 16 mg/dL (ref 6–23)
Calcium: 8.7 mg/dL (ref 8.4–10.5)
Chloride: 108 mEq/L (ref 96–112)
Chloride: 105 mEq/L (ref 96–112)
Creatinine, Ser: 1.2 mg/dL (ref 0.4–1.2)
GFR calc Af Amer: 53 mL/min — ABNORMAL LOW (ref >60)
GFR calc Af Amer: 53 mL/min — ABNORMAL LOW (ref >60)
Potassium: 4.1 mEq/L (ref 3.5–5.1)
Sodium: 139 mEq/L (ref 135–145)
Total Bilirubin: 0.4 mg/dL (ref 0.3–1.2)
Total Bilirubin: 0.4 mg/dL (ref 0.3–1.2)

## 2010-11-21 LAB — DIFFERENTIAL
Basophils Absolute: 0.3 10*3/uL — ABNORMAL HIGH (ref 0.0–0.1)
Basophils Relative: 3 % — ABNORMAL HIGH (ref 0–1)
Eosinophils Relative: 2 % (ref 0–5)
Monocytes Absolute: 0.8 10*3/uL (ref 0.1–1.0)
Monocytes Relative: 9 % (ref 3–12)

## 2010-11-21 LAB — URINE MICROSCOPIC-ADD ON

## 2010-11-21 LAB — PROTIME-INR
INR: 1 (ref 0.00–1.49)
Prothrombin Time: 13.9 seconds (ref 11.6–15.2)

## 2010-11-21 LAB — HEMOGLOBIN A1C: Mean Plasma Glucose: 103 mg/dL

## 2010-11-21 LAB — URINALYSIS, ROUTINE W REFLEX MICROSCOPIC
Glucose, UA: NEGATIVE mg/dL
Ketones, ur: NEGATIVE mg/dL
Specific Gravity, Urine: 1.017 (ref 1.005–1.030)
pH: 5.5 (ref 5.0–8.0)

## 2010-11-21 LAB — HEMOCCULT GUIAC POC 1CARD (OFFICE): Fecal Occult Bld: NEGATIVE

## 2010-11-21 LAB — LIPASE, BLOOD: Lipase: 17 U/L (ref 11–59)

## 2010-11-21 LAB — HEPARIN LEVEL (UNFRACTIONATED): Heparin Unfractionated: 1.62 IU/mL — ABNORMAL HIGH (ref 0.30–0.70)

## 2010-11-21 LAB — AMYLASE: Amylase: 62 U/L (ref 27–131)

## 2010-11-22 LAB — DIFFERENTIAL
Basophils Absolute: 0 10*3/uL (ref 0.0–0.1)
Eosinophils Relative: 2 % (ref 0–5)
Lymphocytes Relative: 7 % — ABNORMAL LOW (ref 12–46)
Lymphs Abs: 0.7 10*3/uL (ref 0.7–4.0)
Monocytes Absolute: 0.6 10*3/uL (ref 0.1–1.0)
Monocytes Relative: 6 % (ref 3–12)
Neutro Abs: 8.6 10*3/uL — ABNORMAL HIGH (ref 1.7–7.7)

## 2010-11-22 LAB — BASIC METABOLIC PANEL
Calcium: 9.5 mg/dL (ref 8.4–10.5)
GFR calc Af Amer: 54 mL/min — ABNORMAL LOW (ref >60)
GFR calc non Af Amer: 44 mL/min — ABNORMAL LOW (ref >60)
Glucose, Bld: 131 mg/dL — ABNORMAL HIGH (ref 70–99)
Potassium: 5.3 mEq/L — ABNORMAL HIGH (ref 3.5–5.1)
Sodium: 137 mEq/L (ref 135–145)

## 2010-11-22 LAB — CBC
HCT: 41 % (ref 36.0–46.0)
Hemoglobin: 14 g/dL (ref 12.0–15.0)
RBC: 4.85 MIL/uL (ref 3.87–5.11)
WBC: 10.2 10*3/uL (ref 4.0–10.5)

## 2010-11-22 LAB — POCT CARDIAC MARKERS

## 2010-12-23 ENCOUNTER — Encounter: Payer: Self-pay | Admitting: Internal Medicine

## 2010-12-26 ENCOUNTER — Ambulatory Visit (INDEPENDENT_AMBULATORY_CARE_PROVIDER_SITE_OTHER): Payer: Medicare Other | Admitting: Internal Medicine

## 2010-12-26 ENCOUNTER — Encounter: Payer: Self-pay | Admitting: Internal Medicine

## 2010-12-26 VITALS — BP 126/82 | HR 55 | Ht 62.0 in | Wt 146.8 lb

## 2010-12-26 DIAGNOSIS — J4489 Other specified chronic obstructive pulmonary disease: Secondary | ICD-10-CM

## 2010-12-26 DIAGNOSIS — R930 Abnormal findings on diagnostic imaging of skull and head, not elsewhere classified: Secondary | ICD-10-CM

## 2010-12-26 DIAGNOSIS — J449 Chronic obstructive pulmonary disease, unspecified: Secondary | ICD-10-CM

## 2010-12-26 NOTE — Patient Instructions (Signed)
Sample Spiriva- continue as before  Call for refills as needed.

## 2010-12-26 NOTE — Progress Notes (Signed)
Subjective:     Patient ID: Claudia [name], female   DOB: 20-Feb-1933, 77 y.o.   MRN: [id number]  HPI 12/26/10- 77 yoF follwed for COPD/ asthma, rhinitis,  complicated by atrial flutter. Last here July 04, 2010 when we reviewed PFT with moderate obstruction, normal .  Last CXR was at hospital in Feb for atrial fib, showing CE, COPD, NAD.  Breathing was "rough" with increased shortness of breath w/o wheeze in March. Nose was running. She doesn't know if it was pollen or something about a heart med, but it got better. Throat feels full, lying in bed at night. Admits heartburn.Short of breath with steady walking and stairs. Spiriva helps.   Review of Systems Constitutional:   No weight loss, night sweats,  Fevers, chills, fatigue, lassitude. HEENT:   No headaches,  Difficulty swallowing,  Tooth/dental problems,  Sore throat,                No sneezing, itching, ear ache, nasal congestion, post nasal drip,   CV:  No chest pain,  Orthopnea, PND, swelling in lower extremities, anasarca, dizziness, palpitations  GI  No abdominal pain, nausea, vomiting, diarrhea, change in bowel habits, loss of appetite  Resp:No excess mucus, no productive cough,  No non-productive cough,  No coughing up of blood.  No change in color of mucus.  No wheezing.   Skin: no rash or lesions.  GU: no dysuria, change in color of urine, no urgency or frequency.  No flank pain.  MS:  No joint pain or swelling.  No decreased range of motion.  No back pain.  Psych:  No change in mood or affect. No depression or anxiety.  No memory loss.      Objective:   Physical Exam General- Alert, Oriented, Affect-appropriate, Distress- none acute  Skin- rash-none, lesions- none, excoriation- none  Lymphadenopathy- none  Head- atraumatic  Eyes- Gross vision intact, PERRLA, conjunctivae clear, secretions  Ears- Hearing, canals, - normal,  Nose- Clear,  No- eptal dev, mucus, polyps, erosion, perforation   Throat-  Mallampati II , mucosa clear , drainage- none, tonsils- atrophic  Neck- flexible , trachea midline, no stridor , thyroid nl, carotid no bruit  Chest - symmetrical excursion , unlabored     Heart/CV- RRR , no murmur , no gallop  , no rub, nl s1 s2                     - JVD- none , edema- none, stasis changes- none, varices- none     Lung- clear to P&A- distant,     wheeze- none, cough- none , dullness-none, rub- none     Chest wall-  Abd- tender-no, distended-no, bowel sounds-present, HSM- no  Br/ Gen/ Rectal- Not done, not indicated  Extrem- cyanosis- none, clubbing, none, atrophy- none, strength- nl  Neuro- grossly intact to observation   Resting tremor      Assessment:         Plan:

## 2010-12-26 NOTE — Assessment & Plan Note (Signed)
Stable. She sees the benefit of Spiriva but cost is a factor. With the problem of heart rhythm, she needs to stay away from albuterol products.

## 2010-12-26 NOTE — Assessment & Plan Note (Signed)
No active issue of concern. I will resolve this problem

## 2010-12-27 NOTE — Assessment & Plan Note (Signed)
[facility]                             PULMONARY OFFICE NOTE   NAME:Claudia [name], Claudia [name]                       MRN:          [id number]  DATE:02/08/2007                            DOB:          12-Jan-1933    PROBLEMS:  1. Asthma/chronic obstructive pulmonary disease.  2. Abnormal chest x-ray.  3. Paroxysmal atrial flutter.   HISTORY:  Scheduled follow up, doing fine. She could not tolerate  Asmanex blamed for dizziness and nausea. She had follow up of thyroid  and mammogram abnormalities and understands that everything checked out  well. Medication list is reviewed, unchanged. She is off of Lisinopril,  using Cozaar 50 mg. She is drug intolerant of CODEINE and AUGMENTIN.   OBJECTIVE:  Weight 161 pounds, blood pressure 154/84, pulse 62, room air  saturation 95%. Breath sounds are diminished and quiet. Heart sounds are  regular. There is no edema or adenopathy.   IMPRESSION:  Asthma/chronic obstructive pulmonary disease currently  stable.   PLAN:  She is off of Asmanex and we will leave her off of an inhaled  steroid for now. Continue Spiriva. Chest x-ray. Schedule return 6  months, earlier p.r.n.     Clinton D. Maple Hudson, MD, Tonny Bollman, FACP  Electronically Signed    CDY/MedQ  DD: 02/10/2007  DT: 02/11/2007  Job #: [id number]   cc:   Gabriel Earing, M.D.  Thereasa Solo. Little, M.D.

## 2010-12-27 NOTE — Discharge Summary (Signed)
NAMEMADHURI, VACCA                ACCOUNT NO.:  [id number]   MEDICAL RECORD NO.:  [id number]          PATIENT TYPE:  INP   LOCATION:  4738                         FACILITY:  [facility]   PHYSICIAN:  Thereasa Solo. Little, M.D. DATE OF BIRTH:  25-Jan-1933   DATE OF ADMISSION:  01/18/2009  DATE OF DISCHARGE:  01/21/2009                               DISCHARGE SUMMARY   DISCHARGE DIAGNOSES:  1. Chest pain, myocardial infarction ruled out, a low-risk Myoview      this admission.  2. Paroxysmal atrial fibrillation this admission, initially put on      amiodarone but changed to Multaq at discharge because of chronic      obstructive pulmonary disease.  3. Chronic obstructive pulmonary disease and asthma.  4. Treated dyslipidemia, Zocor was changed to Crestor this admission.  5. Hypertension.  6. History of tremor.   HOSPITAL COURSE:  The patient is a pleasant 77 year old female who had  past history of PAF.  She had a Myoview a couple of years ago that was  negative for ischemia.  She says Dr. Clarene Duke put her on Paxil and Xanax  when she had some irregular heart rhythm in the past and this settled  things down.  She was admitted on January 18, 2009, with indigestion-type  chest pain.  Symptoms were worrisome for unstable angina.  She was  admitted to telemetry.  CK-MB and troponins were negative.  She was put  on IV heparin, aspirin, and continued on her beta-blocker.  She did go  into atrial fibrillation after admission with rapid ventricular  response.  She converted with IV amiodarone.  TSH was within normal  limits.  She was set up for a [employer] on January 19, 2009.  This  was low risk.  Echocardiogram showed preserved LV function.  Abdominal  ultrasound showed no significant findings.  We changed her IV amiodarone  to Multaq before discharge.  Her QTc is 418.  We feel she can be  discharged on January 21, 2009.  She will follow up with Dr. Clarene Duke.  She  will need a protime in the office on  Monday.   LABORATORY DATA:  Urine culture shows 35,000 Staph, she was put on  Septra for a couple of days, but this was discontinued because of  nausea.  Troponins are negative.  TSH is 1.25.  H. pylori is negative at  less than 0.4.  Liver functions were normal.  Amylase 62, lipase 17, and  hemoglobin A1c 5.2, all within normal limits.  Sodium is 142, potassium  4.7, BUN 14, and creatinine 1.39.  White count 8.3, hemoglobin 11.8,  hematocrit 35.4, and platelets 149.  Myoview showed no reversible  ischemia or infarct with normal LV function.  Abdominal ultrasound was  negative.  LV function was normal with an EF of 60%.  Telemetry shows  sinus rhythm.  EKG shows sinus rhythm without acute changes.   DISCHARGE MEDICATIONS:  1. Paxil 20 mg a day.  2. Aspirin 81 mg a day.  3. Cozaar has been cut back to 50 mg a day.  4.  Xanax 0.25 twice a day.  5. Toprol 50 mg a day.  6. Spiriva 18 mcg a day.  7. Prilosec 20 mg a day.  8. Coumadin 5 mg a day or as directed.  9. Crestor 10 mg a day.  10.Multaq 400 mg b.i.d.  11.ProAir inhaler 90 mcg once a day p.r.n.   DISPOSITION:  The patient is discharged in stable condition.  She will  have a protime on Monday.  She will see Dr. Clarene Duke in a week or so.      Abelino Derrick, P.A.    ______________________________  Thereasa Solo. Little, M.D.    Lenard Lance  D:  01/21/2009  T:  01/22/2009  Job:  [id number]

## 2010-12-30 NOTE — Assessment & Plan Note (Signed)
[facility]                               PULMONARY OFFICE NOTE   NAME:Claudia [name], Claudia [name]                       MRN:          [id number]  DATE:05/17/2006                            DOB:          12/03/1932    PROBLEM:  Pulmonary consultation at the kind request of Dr. Caprice Kluver for  this 77 year old former smoker with shortness of breath.  She had been  evaluated by Dr. Clarene Duke for atrial flutter, and has a past history of  asthma.  She has taken cortisone tapers which help her shortness of breath,  but feels that she cannot take a comfortable deep breath.  This has been  coming on gradually over the last several years, with no sudden event.  Occasional wheeze, little cough, no phlegm.  Her breathing does not wake her  at night.  There has been no blood and no chest pain.  There is little  change from one day to the next.  The summer heat bothered her, as expected,  and she stayed in.  In the winter she expects to get colds with bronchitis,  and her nose runs.  Exercise tolerance is limited to less than one block by  dyspnea.  She has an albuterol inhaler, but avoids using it because it  causes nervousness and has increased her palpitation.   MEDICATIONS:  1. Lipitor 10 mg.  2. Paxil 20 mg.  3. Toprol XL 50 mg.  4. Aspirin 81 mg.   On a p.r.n. basis she uses:  1. Lasix 20 mg.  2. Alprazolam 0.25 mg.  3. An albuterol rescue inhaler.   ALLERGIES:  Drug intolerant to CODEINE which makes her nauseated, and  AUGMENTIN said to cause difficulty breathing, with no recognized problems  from latex, contrast dye or aspirin.   REVIEW OF SYSTEMS:  Weight has been stable.  She denies exertional or  pleuritic chest pain, abnormal bleeding or bruising, fever or sweats,  purulent discharge.  Occasional nasal congestion.  No reflux or change in  bowel or bladder habit, no joint pain or ankle edema of significance.  Occasional anxiety and depression.   PAST  HISTORY:  1. Atrial flutter, paroxysmal.  2. Hypertension.  3. Asthma.  4. Elevated cholesterol.  5. Seasonal allergy with nasal congestion.  6. Pneumonia.  7. Pneumococcal vaccine once 4 years ago.  8. She gets flu shots.  9. Tonsillectomy.  10.Tubal ligation.  11.No tuberculosis exposure and TB skin test has been negative.  12.Her last prednisone was in June of 2007.   SOCIAL HISTORY:  She quit smoking 2 packs per day in 1989.  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   FAMILY HISTORY:  Father died with lung cancer, mother died with bone cancer.   OBJECTIVE:  Weight 158 pounds, BP 174/106, pulse regular 52, room air  saturation 94%.  Medium build, no distress.  SKIN:  Without rash.  ADENOPATHY:  None found at the neck, shoulders or axillae.  HEENT:  Dentures, oropharynx clear, no neck vein distention or stridor,  voice quality normal.  CHEST:  Breath  sounds are somewhat shallow, quiet, distant and unlabored,  with no rales, wheeze, pleural rub or dullness.  CARDIAC:  Heart sounds are regular without murmur or gallop.  I could feel no enlargement of liver or spleen.  She denied breast problems.  EXTREMITIES:  No clubbing, cyanosis or edema.   CHEST X-RAY:  I reviewed today's chest film, which shows compensatory  emphysema, some apparent deviation of the trachea to the right and  atelectasis in the right upper lobe, suggestive of an obstructing lesion in  the right upper lobe bronchus.  Upon review of that film, I called her and  reached her at home tonight.  She agrees to a CT scan of the chest, as  discussed.   IMPRESSION:  1. Dyspnea.  2. Abnormal chest x-ray, probable right upper lobe mass.  3. Chronic obstructive pulmonary disease.  4. Paroxysmal atrial flutter.   PLAN:  1. Schedule pulmonary function test.  2. CT scan of chest with contrast and comprehensive metabolic panel.  3. Sample Spiriva with instruction, once daily.  4. Schedule return in 3  weeks, earlier p.r.n.       Clinton D. Maple Hudson, MD, FCCP, FACP      CDY/MedQ  DD:  05/17/2006  DT:  05/19/2006  Job #:  [id number]   cc:   Thereasa Solo. Little, M.D.  Gabriel Earing, M.D.

## 2010-12-30 NOTE — Assessment & Plan Note (Signed)
[facility]                             PULMONARY OFFICE NOTE   NAME:Claudia [name], MARLETA LAPIERRE                       MRN:          [id number]  DATE:10/11/2006                            DOB:          May 21, 1933    PROBLEM:  1. Asthma/COPD.  2. Abnormal chest x-ray.  3. Paroxysmal atrial flutter.   HISTORY:  She says she is doing much better and that Cipro has been a  real help.  She could not tell about Asmanex but had only used it for a  few days before she developed what sounds like a viral GI illness and  stopped it.  I have reassured it would be okay to try it again.  She has  just started lisinopril.  I instructed her to watch for persistent dry  cough, but so far she is having no problems.  We reviewed again a CT  scan that she had had in October showing, showing bilateral upper lobe  atelectasis with no mass, some airway narrowing attributed to  bronchomalacia, cardiomegaly, a breast density which turned out on  mammogram to be a cyst, and a left thyroid cyst for which followup was  directed to Dr. Andi Devon.  She feels those issues have been addressed.   MEDICATIONS:  1. Lipitor 10 mg.  2. Paxil 20 mg.  3. Toprol XL 50 mg.  4. Aspirin 81 mg.  5. Spiriva.  6. Lisinopril 10 mg.  7. P.r.n. use of Lasix 20.  8. Alprazolam 0.25 mg.  9. Rescue albuterol inhaler.   ALLERGIES:  DRUG INTOLERANT TO CODEINE AND AUGMENTIN.   OBJECTIVE:  VITAL SIGNS:  Weight 165 pounds, BP 188/90, room air  saturation 91%, mildly wheezy cough, not labored and quiet breathing.  Her chest is almost quiet and clear.  Pulse is regular.  I do not hear a  murmur.  There is no edema.   IMPRESSION:  Asthma with chronic obstructive pulmonary disease.   PLAN:  1. Continue Spiriva.  2. Re-try her Asmanex.  3. Walk for endurance.  4. Schedule return 4 months, and I anticipate a follow-up chest x-ray      at that point.     Clinton D. Maple Hudson, MD, Tonny Bollman, FACP  Electronically  Signed    CDY/MedQ  DD: 10/11/2006  DT: 10/11/2006  Job #: [id number]   cc:   Gabriel Earing, M.D.  Thereasa Solo. Little, M.D.

## 2010-12-30 NOTE — Letter (Signed)
April 14, 2007    Trial Court Administrator  ATTENTION:  Payton Mccallum excusal request  [address]  [place], [place] [address]   REFERENCE:        MOSETTA, FERDINAND  MR#               [id number]  DOB:              1932-12-05  JUROR#            [id number]  PANEL#            [id number]   Dear Trial Court Administrator:   RE:  ZNIYAH, MIDKIFF  MRN:  [id number]  /  DOB:  [birth year]   I am the pulmonary physician caring for Claudia [name], who has a jury  summons for [employer] on Monday, May 13, 2007 at 8:30  a.m. She is treated for severe chronic obstructive pulmonary disease,  which is a chronic medical problem, complicated also by intermittent  heart rhythm disturbance. Her condition requires periodic therapy with  bronchodilator medications. It is not medically realistic to expect this  woman to be able to participate in the activities and responsibilities  of a juror. I doubt that this condition will substantially improve.   Respectfully,    Sincerely,      Clinton D. Maple Hudson, MD, Tonny Bollman, FACP  Electronically Signed    CDY/MedQ  DD: 04/14/2007  DT: 04/14/2007  Job #: [id number]

## 2010-12-30 NOTE — Assessment & Plan Note (Signed)
[facility]                               PULMONARY OFFICE NOTE   NAME:Claudia [name], Claudia [name]                       MRN:          [id number]  DATE:06/13/2006                            DOB:          [birth year]    PROBLEM LIST:  1. Dyspnea.  2. Abnormal chest x-ray.  3. Chronic obstructive pulmonary disease.  4. Paroxysmal atrial flutter.   HISTORY OF PRESENT ILLNESS:  Returns having had flu shot and saying she  feels fine. I had read a rotated chest x-ray as having atelectasis in the  right upper lobe. The radiologist disagreed, and fortunately on CT scan  there were no acutely significant pulmonary findings. There was bilateral  upper lobe linear atelectasis with no solid light lesion. There was some  narrowing of the airways consistent with bronchomalacia but no endobronchial  lesion. Cardiomegaly was noted as well as asymmetric density in the left  breast which she is aware of. She gets regular mammograms. There was also a  hypodense area in the left thyroid. The radiologist recommended thyroid  ultrasound. I have asked her to speak with  Dr. Andi Devon about this when she sees him as scheduled soon. We will send him  a copy of the CT report. She has had flu shot.   MEDICATIONS:  1. Lipitor 10 mg.  2. Paxil 20 mg.  3. Toprol XL 50 mg.  4. Aspirin 81 mg.  5. Spiriva once daily.  6. Lasix 20 mg as used p.r.n. along with albuterol as a rescue inhaler      with occasional alprazolam.   ALLERGIES:  Drug intolerant to CODEINE and AUGMENTIN.   OBJECTIVE:  VITAL SIGNS:  Weight 163 pounds, BP 154/80, pulse regular now at  56, room air saturation 97%.  HEART:  Heart sounds are regular without murmur or gallop.  LYMPHADENOPATHY:  I do not find adenopathy or edema.  LUNGS:  Breath sounds are diminished but without rales or wheeze or rhonchi.  Pulmonary function testing showed moderate obstructive airways disease with  slight response to bronchodilator,  normal lung volumes, and severely reduced  diffusion capacity which would be consistent with emphysema. Her FEV1 was  1.09 (60%), FVC 2.19 (85%), ratio 0.50. Diffusion capacity was only 37% of  predicted. On a 6 minute walk test she went 339 meters without stopping or  complaint of pain. Blood pressure during that exercise rose from 154/84 to  180/90 and settled back 2 minutes later to 176/86. Heart rate rose from 56  to 111 and settled back to 91. Oxygen saturation began the exercise at 97%  on room air. Fell to 81% by the end of the test and 2 minutes later had come  back up to 86% indicating a significant pulmonary basis for her exertional  dyspnea. We discussed home oxygen for exercise, but I have asked her to walk  and work on weight loss first. I would also like to see if we can stabilize  her airways with an inhaled cortisone, and we will reassess her oxygen  status on return.   PLAN:  1. Walk for endurance and weight loss.  2. Keep followup appointment for primary medical care with Dr. Andi Devon to      follow up on the thyroid abnormalities seen on chest CT and also to      make sure that the breast density is followed up.  3. Asmanex 2 puffs daily with discussion.  4. Schedule return in 2-3 months or earlier p.r.n.     Clinton D. Maple Hudson, MD, Tonny Bollman, FACP  Electronically Signed    CDY/MedQ  DD: 06/14/2006  DT: 06/15/2006  Job #: [id number]   cc:   Thereasa Solo. Little, M.D.  Gabriel Earing, M.D.

## 2011-01-02 ENCOUNTER — Ambulatory Visit: Payer: Self-pay | Admitting: Internal Medicine

## 2011-06-08 ENCOUNTER — Other Ambulatory Visit: Payer: Self-pay | Admitting: Internal Medicine

## 2011-06-28 ENCOUNTER — Ambulatory Visit (INDEPENDENT_AMBULATORY_CARE_PROVIDER_SITE_OTHER): Payer: Medicare Other | Admitting: Internal Medicine

## 2011-06-28 ENCOUNTER — Encounter: Payer: Self-pay | Admitting: Internal Medicine

## 2011-06-28 VITALS — BP 142/62 | HR 61 | Ht 62.0 in | Wt 144.6 lb

## 2011-06-28 DIAGNOSIS — I4892 Unspecified atrial flutter: Secondary | ICD-10-CM

## 2011-06-28 DIAGNOSIS — J4489 Other specified chronic obstructive pulmonary disease: Secondary | ICD-10-CM

## 2011-06-28 DIAGNOSIS — J449 Chronic obstructive pulmonary disease, unspecified: Secondary | ICD-10-CM

## 2011-06-28 MED ORDER — ALBUTEROL SULFATE HFA 108 (90 BASE) MCG/ACT IN AERS: 2.0000 | INHALATION_SPRAY | Freq: Four times a day (QID) | RESPIRATORY_TRACT | Status: DC | PRN

## 2011-06-28 MED ORDER — TIOTROPIUM BROMIDE MONOHYDRATE 18 MCG IN CAPS: 18.0000 ug | ORAL_CAPSULE | Freq: Every day | RESPIRATORY_TRACT | Status: DC

## 2011-06-28 NOTE — Progress Notes (Signed)
Patient ID: Claudia [name], female   DOB: 12-Jun-1933, 77 y.o.   MRN: [id number]  HPI 12/26/10- 77 yoF follwed for COPD/ asthma, rhinitis,  complicated by atrial flutter. Last here July 04, 2010 when we reviewed PFT with moderate obstruction, normal .  Last CXR was at hospital in Feb for atrial fib, showing CE, COPD, NAD.  Breathing was "rough" with increased shortness of breath w/o wheeze in March. Nose was running. She doesn't know if it was pollen or something about a heart med, but it got better. Throat feels full, lying in bed at night. Admits heartburn.Short of breath with steady walking and stairs. Spiriva helps.   06/28/11- 77 yoF follwed for COPD/ asthma, rhinitis,  complicated by atrial flutter. Has had flu vax. Feels comfortable, accepting unchanged dyspnea on exertion with house cleaning or brisk walks. Paces herself ok. Afib stays controlled/ Dr Clarene Duke. Accepts cost of Spiriva and finds she needs it almost every day. She wanted to take it every other day to cut costs, but can't get away with that without a decline in her breathing. Med refills discussed.    Review of Systems-see HPI Constitutional:   No-   weight loss, night sweats, fevers, chills, fatigue, lassitude. HEENT:   No-  headaches, difficulty swallowing, tooth/dental problems, sore throat,       No-  sneezing, itching, ear ache, nasal congestion, post nasal drip,  CV:  No-   chest pain, orthopnea, PND, swelling in lower extremities, anasarca,                                  dizziness, palpitations Resp: No-   shortness of breath with exertion or at rest.              No-   productive cough,  No non-productive cough,  No- coughing up of blood.   .  Skin: No-   rash or lesions. GI:  No-   heartburn, indigestion, abdominal pain, nausea, vomiting, diarrhea,                 change in bowel habits, loss of appetite GU: No-   dysuria, change in color of urine, no urgency or frequency.  No- flank pain. MS:  No-    joint pain or swelling.  No- decreased range of motion.  No- back pain. Neuro-     nothing unusual Psych:  No- change in mood or affect. No depression or anxiety.  No memory loss.       Objective:   Physical Exam General- Alert, Oriented, Affect-appropriate, Distress- none acute Skin- rash-none, lesions- none, excoriation- none Lymphadenopathy- none Head- atraumatic            Eyes- Gross vision intact, PERRLA, conjunctivae clear secretions            Ears- Hearing, canals-normal            Nose- Clear, no-Septal dev, mucus, polyps, erosion, perforation             Throat- Mallampati II , mucosa clear , drainage- none, tonsils- atrophic Neck- flexible , trachea midline, no stridor , thyroid nl, carotid no bruit Chest - symmetrical excursion , unlabored           Heart/CV- RRR , no murmur , no gallop  , no rub, nl s1 s2                           -  JVD- none , edema- none, stasis changes- none, varices- none           Lung- clear to P&A, wheeze- none, cough- none , dullness-none, rub- none           Chest wall-  Abd- tender-no, distended-no, bowel sounds-present, HSM- no Br/ Gen/ Rectal- Not done, not indicated Extrem- cyanosis- none, clubbing, none, atrophy- none, strength- nl Neuro- grossly intact to observation

## 2011-06-28 NOTE — Patient Instructions (Signed)
Scripts sent for [employer] and Spiriva      Please call as needed

## 2011-07-02 NOTE — Assessment & Plan Note (Signed)
She is willing to continue Spiriva and accept the cost.

## 2011-07-02 NOTE — Assessment & Plan Note (Signed)
Either ventricular response is very well controlled or she is in sinus rhythm today by palpation. We have avoided much use of active bronchodilator stimulation because of this heart rhythm.

## 2011-07-18 ENCOUNTER — Other Ambulatory Visit: Payer: Self-pay | Admitting: Internal Medicine

## 2011-07-18 DIAGNOSIS — J449 Chronic obstructive pulmonary disease, unspecified: Secondary | ICD-10-CM

## 2011-07-24 ENCOUNTER — Telehealth: Payer: Self-pay | Admitting: Internal Medicine

## 2011-07-24 DIAGNOSIS — J449 Chronic obstructive pulmonary disease, unspecified: Secondary | ICD-10-CM

## 2011-07-24 NOTE — Telephone Encounter (Signed)
[facility] lab service reports ONOX 07/20/11 with 3 hours less than or equal to 88% sat on room air.  We will authorize home O2 during sleep.

## 2011-07-25 ENCOUNTER — Telehealth: Payer: Self-pay | Admitting: Internal Medicine

## 2011-07-25 NOTE — Telephone Encounter (Signed)
Pt states she had not heard from our office but did hear from [facility] about receiving concentrator. I informed pt per CDY findings on her ONO is why he ordered her to use 2 liters at bedtime.

## 2011-07-26 ENCOUNTER — Encounter: Payer: Self-pay | Admitting: Internal Medicine

## 2011-10-16 DIAGNOSIS — H6980 Other specified disorders of Eustachian tube, unspecified ear: Secondary | ICD-10-CM | POA: Insufficient documentation

## 2011-10-31 ENCOUNTER — Telehealth: Payer: Self-pay | Admitting: Internal Medicine

## 2011-10-31 NOTE — Telephone Encounter (Signed)
I spoke with pt and she thinks she may have bronchitis and is requesting to see cdy tomorrow. He had an opening at 9 tomorrow and so i scheduled pt to come in at that time. Nothing further was needed

## 2011-11-01 ENCOUNTER — Ambulatory Visit (INDEPENDENT_AMBULATORY_CARE_PROVIDER_SITE_OTHER): Payer: Medicare Other | Admitting: Internal Medicine

## 2011-11-01 ENCOUNTER — Encounter: Payer: Self-pay | Admitting: Internal Medicine

## 2011-11-01 VITALS — BP 160/78 | HR 63 | Ht 62.0 in | Wt 145.8 lb

## 2011-11-01 DIAGNOSIS — J449 Chronic obstructive pulmonary disease, unspecified: Secondary | ICD-10-CM

## 2011-11-01 DIAGNOSIS — J310 Chronic rhinitis: Secondary | ICD-10-CM

## 2011-11-01 DIAGNOSIS — J45909 Unspecified asthma, uncomplicated: Secondary | ICD-10-CM

## 2011-11-01 MED ORDER — METHYLPREDNISOLONE ACETATE 80 MG/ML IJ SUSP
80.0000 mg | Freq: Once | INTRAMUSCULAR | Status: AC
  Administered 2011-11-01: 80 mg via INTRAMUSCULAR

## 2011-11-01 MED ORDER — LEVALBUTEROL HCL 0.63 MG/3ML IN NEBU
0.6300 mg | INHALATION_SOLUTION | Freq: Once | RESPIRATORY_TRACT | Status: AC
  Administered 2011-11-01: 0.63 mg via RESPIRATORY_TRACT

## 2011-11-01 MED ORDER — DOXYCYCLINE HYCLATE 100 MG PO TABS: ORAL_TABLET | ORAL | Status: DC

## 2011-11-01 NOTE — Patient Instructions (Signed)
Script for doxycycline antibiotic sent. This will interact some with your coumadin temporarily  Neb xop 0.63  Depo 80  A mucus thinner like Mucinex may help some.

## 2011-11-01 NOTE — Progress Notes (Signed)
Patient ID: Claudia [name], female   DOB: 1933/06/01, 78 y.o.   MRN: [id number]  HPI 12/26/10- 77 yoF follwed for COPD/ asthma, rhinitis,  complicated by atrial flutter. Last here July 04, 2010 when we reviewed PFT with moderate obstruction, normal .  Last CXR was at hospital in Feb for atrial fib, showing CE, COPD, NAD.  Breathing was "rough" with increased shortness of breath w/o wheeze in March. Nose was running. She doesn't know if it was pollen or something about a heart med, but it got better. Throat feels full, lying in bed at night. Admits heartburn.Short of breath with steady walking and stairs. Spiriva helps.   06/28/11- 77 yoF follwed for COPD/ asthma, rhinitis,  complicated by atrial flutter. Has had flu vax. Feels comfortable, accepting unchanged dyspnea on exertion with house cleaning or brisk walks. Paces herself ok. Afib stays controlled/ Dr Clarene Duke. Accepts cost of Spiriva and finds she needs it almost every day. She wanted to take it every other day to cut costs, but can't get away with that without a decline in her breathing. Med refills discussed.   11/01/11- 77 yoF follwed for COPD/ asthma, rhinitis,  complicated by atrial flutter. 2 weeks ago went to urgent care for head congestion. Given antihistamine and steroid nasal sprays which did not help. Still feels stopped up with no headache. Now for 2 days has been coughing productive yellow. Denies fever, sneeze, itch, sore throat. Slight wheeze. She smoked 2 packs per day until 1989. Using home oxygen 2 L for sleep. Doesn't know name of her DME company.  Review of Systems-see HPI Constitutional:   No-   weight loss, night sweats, fevers, chills, fatigue, lassitude. HEENT:   No-  headaches, difficulty swallowing, tooth/dental problems, sore throat,       No-  sneezing, itching, ear ache, + nasal congestion, post nasal drip,  CV:  No-   chest pain, orthopnea, PND, swelling in lower extremities, anasarca,   dizziness,  palpitations Resp: No-   shortness of breath with exertion or at rest.             +productive cough,  No non-productive cough,  No- coughing up of blood.   .  Skin: No-   rash or lesions. GI:  No-   heartburn, indigestion, abdominal pain, nausea, vomiting,  GU: MS:  No-   joint pain or swelling. Neuro-     nothing unusual Psych:  No- change in mood or affect. No depression or anxiety.  No memory loss.     Objective:   Physical Exam General- Alert, Oriented, Affect-appropriate, Distress- none acute Skin- rash-none, lesions- none, excoriation- none Lymphadenopathy- none Head- atraumatic            Eyes- Gross vision intact, PERRLA, conjunctivae clear secretions            Ears- Hearing, canals-normal            Nose- Clear, +Septal dev/ external deviation to right, No- mucus, polyps, erosion, perforation             Throat- Mallampati II , mucosa clear , drainage- none, tonsils- atrophic, dentures Neck- flexible , trachea midline, no stridor , thyroid nl, carotid no bruit Chest - symmetrical excursion , unlabored           Heart/CV- RRR , no murmur , no gallop  , no rub, nl s1 s2                           -  JVD- none , edema- none, stasis changes- none, varices- none           Lung- clear to P&A, + wheezy cough , dullness-none, rub- none           Chest wall- + kyphosis Abd-  Br/ Gen/ Rectal- Not done, not indicated Extrem- cyanosis- none, clubbing, none, atrophy- none, strength- nl Neuro- grossly intact to observation

## 2011-11-04 NOTE — Assessment & Plan Note (Signed)
Acute bronchitis/tracheobronchitis. Plan-nebulized Xopenex, Depo-Medrol, doxycycline.

## 2011-11-04 NOTE — Assessment & Plan Note (Signed)
Eustachian dysfunction Plan Depo-Medrol

## 2011-11-07 ENCOUNTER — Telehealth: Payer: Self-pay | Admitting: Internal Medicine

## 2011-11-07 MED ORDER — FLUCONAZOLE 150 MG PO TABS: 150.0000 mg | ORAL_TABLET | Freq: Every day | ORAL | Status: AC

## 2011-11-07 NOTE — Telephone Encounter (Signed)
Per CY-okay to give Diflucan 150 mg # 3 take 1 qd x 3 days no refills.

## 2011-11-07 NOTE — Telephone Encounter (Signed)
Pt c/o white patches on her cheeks and back of the tongue since yesterday. She is on doxycycline and thinsk this is the cause. Pt wants something called into her pharmacy for this. Pls advise. Allergies  Allergen Reactions  . Amiodarone Hcl   . Clarithromycin   . Clonazepam   . Codeine   . Diltiazem Hcl   . ZOX:[id number] Acid+Aspartame   . Nitrofurantoin   . Rosuvastatin

## 2011-11-07 NOTE — Telephone Encounter (Signed)
Pt aware of prescription for Diflucan and verbalized instructions for use.

## 2011-12-27 ENCOUNTER — Ambulatory Visit (INDEPENDENT_AMBULATORY_CARE_PROVIDER_SITE_OTHER): Payer: Medicare Other | Admitting: Internal Medicine

## 2011-12-27 ENCOUNTER — Encounter: Payer: Self-pay | Admitting: Internal Medicine

## 2011-12-27 ENCOUNTER — Ambulatory Visit (INDEPENDENT_AMBULATORY_CARE_PROVIDER_SITE_OTHER)
Admission: RE | Admit: 2011-12-27 | Discharge: 2011-12-27 | Disposition: A | Discharge status: Home / Self Care | Payer: Medicare Other | Source: Ambulatory Visit | Attending: Internal Medicine | Admitting: Internal Medicine

## 2011-12-27 VITALS — BP 118/64 | HR 53 | Ht 62.0 in | Wt 142.2 lb

## 2011-12-27 DIAGNOSIS — J310 Chronic rhinitis: Secondary | ICD-10-CM

## 2011-12-27 DIAGNOSIS — J4489 Other specified chronic obstructive pulmonary disease: Secondary | ICD-10-CM

## 2011-12-27 DIAGNOSIS — J449 Chronic obstructive pulmonary disease, unspecified: Secondary | ICD-10-CM

## 2011-12-27 NOTE — Patient Instructions (Addendum)
Please call for refills and as needed.  Order- CXR   Dx COPD

## 2011-12-27 NOTE — Progress Notes (Signed)
Patient ID: Claudia [name], female   DOB: 1933-07-02, 78 y.o.   MRN: [id number]  HPI 12/26/10- 77 yoF follwed for COPD/ asthma, rhinitis,  complicated by atrial flutter. Last here July 04, 2010 when we reviewed PFT with moderate obstruction, normal .  Last CXR was at hospital in Feb for atrial fib, showing CE, COPD, NAD.  Breathing was "rough" with increased shortness of breath w/o wheeze in March. Nose was running. She doesn't know if it was pollen or something about a heart med, but it got better. Throat feels full, lying in bed at night. Admits heartburn.Short of breath with steady walking and stairs. Spiriva helps.   06/28/11- 77 yoF follwed for COPD/ asthma, rhinitis,  complicated by atrial flutter. Has had flu vax. Feels comfortable, accepting unchanged dyspnea on exertion with house cleaning or brisk walks. Paces herself ok. Afib stays controlled/ Dr Clarene Duke. Accepts cost of Spiriva and finds she needs it almost every day. She wanted to take it every other day to cut costs, but can't get away with that without a decline in her breathing. Med refills discussed.   11/01/11- 77 yoF follwed for COPD/ asthma, rhinitis,  complicated by atrial flutter. 2 weeks ago went to urgent care for head congestion. Given antihistamine and steroid nasal sprays which did not help. Still feels stopped up with no headache. Now for 2 days has been coughing productive yellow. Denies fever, sneeze, itch, sore throat. Slight wheeze. She smoked 2 packs per day until 1989. Using home oxygen 2 L for sleep. Doesn't know name of her DME company.  12/27/11- 78 yoF follwed for COPD/ asthma, rhinitis,  complicated by atrial flutter. Slight wheezing, cough, congestion, and SOB at times-depends on what patient is doing Resolved bronchitis after last visit. She had another episode of acute bronchitis with her husband in early April. Now feels well. Oxygen 2 L for sleep. Rare need for rescue inhaler. Continues  Spiriva.  Review of Systems-see HPI Constitutional:   No-   weight loss, night sweats, fevers, chills, fatigue, lassitude. HEENT:   No-  headaches, difficulty swallowing, tooth/dental problems, sore throat,       No-  sneezing, itching, ear ache, + nasal congestion, post nasal drip,  CV:  No-   chest pain, orthopnea, PND, swelling in lower extremities, anasarca,   dizziness, palpitations Resp: No-   shortness of breath with exertion or at rest.             No-productive cough,  No non-productive cough,  No- coughing up of blood.   .  Skin: No-   rash or lesions. GI:  No-   heartburn, indigestion, abdominal pain, nausea, vomiting,  GU: MS:  No-   joint pain or swelling. Neuro-     nothing unusual Psych:  No- change in mood or affect. No depression or anxiety.  No memory loss.     Objective:   Physical Exam General- Alert, Oriented, Affect-appropriate, Distress- none acute Skin- rash-none, lesions- none, excoriation- none Lymphadenopathy- none Head- atraumatic            Eyes- Gross vision intact, PERRLA, conjunctivae clear secretions            Ears- Hearing, canals-normal            Nose- Clear, +Septal dev/ external deviation to right, No- mucus, polyps, erosion, perforation             Throat- Mallampati II , mucosa clear , drainage- none, tonsils- atrophic, dentures  Neck- flexible , trachea midline, no stridor , thyroid nl, carotid no bruit Chest - symmetrical excursion , unlabored           Heart/CV- RRR , no murmur , no gallop  , no rub, nl s1 s2                           - JVD- none , edema- none, stasis changes- none, varices- none           Lung- clear to P&A, minor cough , dullness-none, rub- none           Chest wall- + kyphosis Abd-  Br/ Gen/ Rectal- Not done, not indicated Extrem- cyanosis- none, clubbing, none, atrophy- none, strength- nl Neuro- grossly intact to observation

## 2011-12-28 NOTE — Progress Notes (Signed)
Quick Note:  Pt aware of results. ______ 

## 2012-01-01 NOTE — Assessment & Plan Note (Signed)
Controlled seasonal rhinitis

## 2012-01-01 NOTE — Assessment & Plan Note (Signed)
Controlled now after 2 episodes of acute bronchitis consistent with viral infections earlier this year. Plan-chest x-ray

## 2012-06-24 ENCOUNTER — Telehealth: Payer: Self-pay | Admitting: Internal Medicine

## 2012-06-24 NOTE — Telephone Encounter (Signed)
Spoke with the pt to verify msg She states that she spoke with Christoper Allegra again, and advised that we did not have anything before 12/10 and so they gave her an extension until the end of yr. OV with CDY scheduled for 08/13/12 at 9 am Pt states nothing further needed.

## 2012-07-02 ENCOUNTER — Encounter: Payer: Self-pay | Admitting: Internal Medicine

## 2012-07-02 ENCOUNTER — Ambulatory Visit (INDEPENDENT_AMBULATORY_CARE_PROVIDER_SITE_OTHER): Payer: Medicare Other | Admitting: Internal Medicine

## 2012-07-02 VITALS — BP 138/68 | HR 63 | Ht 62.0 in | Wt 145.6 lb

## 2012-07-02 DIAGNOSIS — J449 Chronic obstructive pulmonary disease, unspecified: Secondary | ICD-10-CM

## 2012-07-02 MED ORDER — TIOTROPIUM BROMIDE MONOHYDRATE 18 MCG IN CAPS: 18.0000 ug | ORAL_CAPSULE | Freq: Every day | RESPIRATORY_TRACT | Status: DC

## 2012-07-02 MED ORDER — ALBUTEROL SULFATE HFA 108 (90 BASE) MCG/ACT IN AERS: 2.0000 | INHALATION_SPRAY | Freq: Four times a day (QID) | RESPIRATORY_TRACT | Status: DC | PRN

## 2012-07-02 NOTE — Progress Notes (Signed)
Patient ID: Claudia [name], female   DOB: 16-Oct-1932, 78 y.o.   MRN: [id number]  HPI 12/26/10- 77 yoF follwed for COPD/ asthma, rhinitis,  complicated by atrial flutter. Last here July 04, 2010 when we reviewed PFT with moderate obstruction, normal .  Last CXR was at hospital in Feb for atrial fib, showing CE, COPD, NAD.  Breathing was "rough" with increased shortness of breath w/o wheeze in March. Nose was running. She doesn't know if it was pollen or something about a heart med, but it got better. Throat feels full, lying in bed at night. Admits heartburn.Short of breath with steady walking and stairs. Spiriva helps.   06/28/11- 77 yoF follwed for COPD/ asthma, rhinitis,  complicated by atrial flutter. Has had flu vax. Feels comfortable, accepting unchanged dyspnea on exertion with house cleaning or brisk walks. Paces herself ok. Afib stays controlled/ Dr Clarene Duke. Accepts cost of Spiriva and finds she needs it almost every day. She wanted to take it every other day to cut costs, but can't get away with that without a decline in her breathing. Med refills discussed.   11/01/11- 77 yoF follwed for COPD/ asthma, rhinitis,  complicated by atrial flutter. 2 weeks ago went to urgent care for head congestion. Given antihistamine and steroid nasal sprays which did not help. Still feels stopped up with no headache. Now for 2 days has been coughing productive yellow. Denies fever, sneeze, itch, sore throat. Slight wheeze. She smoked 2 packs per day until 1989. Using home oxygen 2 L for sleep. Doesn't know name of her DME company.  12/27/11- 78 yoF follwed for COPD/ asthma, rhinitis,  complicated by atrial flutter. Slight wheezing, cough, congestion, and SOB at times-depends on what patient is doing Resolved bronchitis after last visit. She had another episode of acute bronchitis with her husband in early April. Now feels well. Oxygen 2 L for sleep. Rare need for rescue inhaler. Continues  Spiriva.  07/02/12- 79 yoF followed for COPD/ asthma, rhinitis,  complicated by atrial flutter. FOLLOWS FOR: breathing doing good overall unless active-walking or cleaning house; uses [employer] for QHS O2-needs to have yearly check for insurance purposes to pay for O2 2L/ sleep/ [employer]. Has had pneumonia vaccine after age 58. Had flu vaccine. She feels well today. Discussed cost of Spiriva. Dyspnea on exertion climbing stairs, cleaning house or doing laundry. CXR 12/28/11 IMPRESSION:  Emphysema right upper lobe scar. No acute finding. Stable  compared to prior exam.  Original Report Authenticated By: Bernadene Bell. Maricela Curet, M.D.   Review of Systems-see HPI Constitutional:   No-   weight loss, night sweats, fevers, chills, fatigue, lassitude. HEENT:   No-  headaches, difficulty swallowing, tooth/dental problems, sore throat,       No-  sneezing, itching, ear ache, + nasal congestion, post nasal drip,  CV:  No-   chest pain, orthopnea, PND, swelling in lower extremities, anasarca,   dizziness, palpitations Resp: + shortness of breath with exertion or at rest.             No-productive cough,  No non-productive cough,  No- coughing up of blood.   .  Skin: No-   rash or lesions. GI:  No-   heartburn, indigestion, abdominal pain, nausea, vomiting,  GU: MS:  No-   joint pain or swelling. Neuro-     nothing unusual Psych:  No- change in mood or affect. No depression or anxiety.  No memory loss.    Objective:   Physical Exam General-  Alert, Oriented, Affect-appropriate, Distress- none acute Skin- rash-none, lesions- none, excoriation- none Lymphadenopathy- none Head- atraumatic            Eyes- Gross vision intact, PERRLA, conjunctivae clear secretions            Ears- Hearing, canals-normal            Nose- Clear, +Septal dev/ external deviation to right, No- mucus, polyps, erosion, perforation             Throat- Mallampati II , mucosa clear , drainage- none, tonsils- atrophic,  dentures Neck- flexible , trachea midline, no stridor , thyroid nl, carotid no bruit Chest - symmetrical excursion , unlabored           Heart/CV- RRR , no murmur , no gallop  , no rub, nl s1 s2                           - JVD- none , edema- none, stasis changes- none, varices- none           Lung- + diminished but clear to P&A, no- cough , dullness-none, rub- none           Chest wall- + kyphosis Abd-  Br/ Gen/ Rectal- Not done, not indicated Extrem- cyanosis- none, clubbing, none, atrophy- none, strength- nl Neuro- grossly intact to observation

## 2012-07-02 NOTE — Patient Instructions (Addendum)
Sample and script Spiriva   Script for albuterol rescue inhaler  Order- ONOX on room air  [facility]   Dx COPD

## 2012-07-12 NOTE — Assessment & Plan Note (Signed)
Dyspnea with housecleaning level of physical activity but without bronchitis symptoms or recent infection. Plan-overnight oximetry on room air to document ongoing need for oxygen during sleep. Refill for Spiriva and pro air with discussion.

## 2012-08-13 ENCOUNTER — Ambulatory Visit: Payer: Medicare Other | Admitting: Internal Medicine

## 2012-09-10 ENCOUNTER — Other Ambulatory Visit: Payer: Self-pay | Admitting: Radiology

## 2012-09-24 ENCOUNTER — Encounter (INDEPENDENT_AMBULATORY_CARE_PROVIDER_SITE_OTHER): Payer: Self-pay | Admitting: Surgery

## 2012-09-24 ENCOUNTER — Ambulatory Visit (INDEPENDENT_AMBULATORY_CARE_PROVIDER_SITE_OTHER): Payer: Medicare Other | Admitting: Surgery

## 2012-09-24 VITALS — BP 128/82 | HR 78 | Temp 98.0°F | Resp 18 | Ht 63.0 in | Wt 139.0 lb

## 2012-09-24 DIAGNOSIS — N6089 Other benign mammary dysplasias of unspecified breast: Secondary | ICD-10-CM

## 2012-09-24 DIAGNOSIS — N6099 Unspecified benign mammary dysplasia of unspecified breast: Secondary | ICD-10-CM | POA: Insufficient documentation

## 2012-09-24 NOTE — Progress Notes (Signed)
Patient ID: Claudia [name], female   DOB: 1933/02/06, 79 y.o.   MRN: [id number]  Chief Complaint  Patient presents with  . New Evaluation    Rt breast abnormal cells    HPI Claudia [name] is a 79 y.o. female.   HPI She is referred here by Dr. Yolanda [name] after recent mammograms demonstrated an abnormality in the right breast. She has since had a stereotactic biopsy of the right breast confirming a complex sclerosing lesion with ductal hyperplasia. She denies discharge from the nipples.  She has had no previous problems with her breast. She does have a sister who has been diagnosed with stage IV breast cancer. She is otherwise without complaints today Past Medical History  Diagnosis Date  . Paroxysmal atrial flutter   . Abnormal chest x-ray   . COPD (chronic obstructive pulmonary disease)     Past Surgical History  Procedure Laterality Date  . Tonsillectomy    . Cataract extraction    . Tubal ligation      Family History  Problem Relation Age of Onset  . Bone cancer Mother   . Cancer Mother     bone  . Bone cancer Father   . Lung cancer Father   . Cancer Father     Lung  . Hyperlipidemia Father   . Hypertension Father   . Hypertension Sister   . Cancer Sister     breast  . Hyperlipidemia Sister   . Hyperlipidemia Sister   . Alzheimer's disease Sister   . Hypertension Sister   . Breast cancer Sister   . Hyperlipidemia Brother   . Hypertension Brother     Social History History  Substance Use Topics  . Smoking status: Former Smoker -- 2.00 packs/day for 25 years    Types: Cigarettes    Quit date: 08/15/1987  . Smokeless tobacco: Not on file  . Alcohol Use: Not on file    Allergies  Allergen Reactions  . Augmentin (Amoxicillin-Pot Clavulanate) Shortness Of Breath  . Doxycycline Shortness Of Breath  . Amiodarone Hcl   . Amoxicillin-Pot Clavulanate   . Clarithromycin   . Clonazepam   . Codeine   . Diltiazem Hcl   . Flecainide   . Nitrofurantoin   .  Rosuvastatin   . Zetia (Ezetimibe)     Current Outpatient Prescriptions  Medication Sig Dispense Refill  . albuterol (PROAIR HFA) 108 (90 BASE) MCG/ACT inhaler Inhale 2 puffs into the lungs every 6 (six) hours as needed for wheezing or shortness of breath.  1 Inhaler  prn  . ALPRAZolam (XANAX) 0.25 MG tablet Take 0.25 mg by mouth 3 (three) times daily as needed.       Marland Kitchen aspirin 81 MG tablet Take 81 mg by mouth daily.        Marland Kitchen losartan (COZAAR) 100 MG tablet Take 100 mg by mouth daily.       . sotalol (BETAPACE) 80 MG tablet Take 80 mg by mouth 2 (two) times daily.        Marland Kitchen tiotropium (SPIRIVA) 18 MCG inhalation capsule Place 1 capsule (18 mcg total) into inhaler and inhale daily.  30 capsule  prn  . warfarin (COUMADIN) 1 MG tablet Take by mouth as directed.        Marland Kitchen amLODipine (NORVASC) 5 MG tablet Take 5 mg by mouth daily.         No current facility-administered medications for this visit.    Review of Systems Review of Systems  Constitutional: Negative for fever, chills and unexpected weight change.  HENT: Negative for hearing loss, congestion, sore throat, trouble swallowing and voice change.   Eyes: Negative for visual disturbance.  Respiratory: Positive for shortness of breath. Negative for cough and wheezing.   Cardiovascular: Negative for chest pain, palpitations and leg swelling.  Gastrointestinal: Negative for nausea, vomiting, abdominal pain, diarrhea, constipation, blood in stool, abdominal distention and anal bleeding.  Genitourinary: Negative for hematuria, vaginal bleeding and difficulty urinating.  Musculoskeletal: Positive for arthralgias.  Skin: Negative for rash and wound.  Neurological: Negative for seizures, syncope and headaches.  Hematological: Negative for adenopathy. Does not bruise/bleed easily.  Psychiatric/Behavioral: Negative for confusion.    Blood pressure 128/82, pulse 78, temperature 98 F (36.7 C), resp. rate 18, height 5\' 3"  (1.6 m), weight 139 lb  (63.05 kg).  Physical Exam Physical Exam  Constitutional: She is oriented to person, place, and time. She appears well-developed and well-nourished. No distress.  HENT:  Head: Normocephalic and atraumatic.  Right Ear: External ear normal.  Left Ear: External ear normal.  Nose: Nose normal.  Mouth/Throat: Oropharynx is clear and moist.  Eyes: Conjunctivae are normal. Pupils are equal, round, and reactive to light. Right eye exhibits no discharge. Left eye exhibits no discharge. No scleral icterus.  Neck: Normal range of motion. Neck supple. No tracheal deviation present.  Cardiovascular: Normal rate, regular rhythm, normal heart sounds and intact distal pulses.   No murmur heard. Pulmonary/Chest: Effort normal and breath sounds normal. No respiratory distress. She has no wheezes.  Abdominal: Soft. Bowel sounds are normal.  Musculoskeletal: Normal range of motion. She exhibits no edema and no tenderness.  Lymphadenopathy:    She has no cervical adenopathy.  Neurological: She is alert and oriented to person, place, and time.  Skin: Skin is warm and dry. No rash noted. She is not diaphoretic. No erythema.  Psychiatric: Her behavior is normal. Judgment normal.  Breasts: Her right breast shows mild ecchymosis from her biopsy. There is no palpable mass. 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 I've discussed this with her and her husband in detail. I discussed the risks of surgery which includes but is not limited to bleeding, infection, need for further surgery should malignancy be present, etc. They understand and wish to proceed. Her Coumadin and aspirin will be held 5 days preoperatively. Likelihood of success is good         Claudia [name] A 09/24/2012, 10:59 AM

## 2012-09-25 ENCOUNTER — Encounter (HOSPITAL_COMMUNITY): Payer: Self-pay

## 2012-09-26 ENCOUNTER — Encounter: Payer: Self-pay | Admitting: Pharmacist Clinician (PhC)/ Clinical Pharmacy Specialist

## 2012-09-30 ENCOUNTER — Encounter (HOSPITAL_COMMUNITY): Payer: Self-pay

## 2012-09-30 ENCOUNTER — Encounter (HOSPITAL_COMMUNITY)
Admission: RE | Admit: 2012-09-30 | Discharge: 2012-09-30 | Disposition: A | Discharge status: Home / Self Care | Payer: Medicare Other | Source: Ambulatory Visit | Attending: Surgery | Admitting: Surgery

## 2012-09-30 ENCOUNTER — Ambulatory Visit (HOSPITAL_COMMUNITY)
Admission: RE | Admit: 2012-09-30 | Discharge: 2012-09-30 | Disposition: A | Discharge status: Home / Self Care | Payer: Medicare Other | Source: Ambulatory Visit | Attending: Anesthesiology | Admitting: Anesthesiology

## 2012-09-30 VITALS — BP 137/81 | HR 68 | Temp 98.0°F | Resp 20 | Ht 62.0 in | Wt 139.9 lb

## 2012-09-30 DIAGNOSIS — I491 Atrial premature depolarization: Secondary | ICD-10-CM | POA: Insufficient documentation

## 2012-09-30 DIAGNOSIS — J4489 Other specified chronic obstructive pulmonary disease: Secondary | ICD-10-CM | POA: Insufficient documentation

## 2012-09-30 DIAGNOSIS — Z01812 Encounter for preprocedural laboratory examination: Secondary | ICD-10-CM | POA: Insufficient documentation

## 2012-09-30 DIAGNOSIS — Z01818 Encounter for other preprocedural examination: Secondary | ICD-10-CM | POA: Insufficient documentation

## 2012-09-30 DIAGNOSIS — Z01810 Encounter for preprocedural cardiovascular examination: Secondary | ICD-10-CM | POA: Insufficient documentation

## 2012-09-30 DIAGNOSIS — J984 Other disorders of lung: Secondary | ICD-10-CM | POA: Insufficient documentation

## 2012-09-30 DIAGNOSIS — R0602 Shortness of breath: Secondary | ICD-10-CM | POA: Insufficient documentation

## 2012-09-30 DIAGNOSIS — J449 Chronic obstructive pulmonary disease, unspecified: Secondary | ICD-10-CM | POA: Insufficient documentation

## 2012-09-30 DIAGNOSIS — N6099 Unspecified benign mammary dysplasia of unspecified breast: Secondary | ICD-10-CM

## 2012-09-30 HISTORY — DX: Pneumonia, unspecified organism: J18.9

## 2012-09-30 HISTORY — DX: Other specified postprocedural states: Z98.890

## 2012-09-30 HISTORY — DX: Nausea with vomiting, unspecified: R11.2

## 2012-09-30 LAB — BASIC METABOLIC PANEL
BUN: 14 mg/dL (ref 6–23)
CO2: 30 mEq/L (ref 19–32)
Calcium: 9.9 mg/dL (ref 8.4–10.5)
Chloride: 102 mEq/L (ref 96–112)
Creatinine, Ser: 0.95 mg/dL (ref 0.50–1.10)
GFR calc Af Amer: 65 mL/min — ABNORMAL LOW (ref >90)
GFR calc non Af Amer: 56 mL/min — ABNORMAL LOW (ref >90)
Glucose, Bld: 99 mg/dL (ref 70–99)
Potassium: 4.6 mEq/L (ref 3.5–5.1)
Sodium: 142 mEq/L (ref 135–145)

## 2012-09-30 LAB — CBC
HCT: 42.4 % (ref 36.0–46.0)
MCHC: 32.1 g/dL (ref 30.0–36.0)
RDW: 14.2 % (ref 11.5–15.5)

## 2012-09-30 LAB — SURGICAL PCR SCREEN: MRSA, PCR: NEGATIVE

## 2012-09-30 LAB — PROTIME-INR
INR: 1.21 (ref 0.00–1.49)
Prothrombin Time: 15.1 seconds (ref 11.6–15.2)

## 2012-09-30 NOTE — Pre-Procedure Instructions (Addendum)
Cheyla Duchemin Danielson  09/30/2012   Your procedure is scheduled on:  10/02/12  Report to [facility] at when finish at [facility] AM.  Call this number if you have problems the morning of surgery: [phone]   Remember:   Do not eat food or drink liquids after midnight.   Take these medicines the morning of surgery with A SIP OF WATER: spiriva, xanax, amlodipine, solotal STOP aspirin, coumadin per dr   Drucilla Schmidt not wear jewelry, make-up or nail polish.  Do not wear lotions, powders, or perfumes. You may wear deodorant.  Do not shave 48 hours prior to surgery. Men may shave face and neck.  Do not bring valuables to the hospital.  Contacts, dentures or bridgework may not be worn into surgery.  Leave suitcase in the car. After surgery it may be brought to your room.  For patients admitted to the hospital, checkout time is 11:00 AM the day of  discharge.   Patients discharged the day of surgery will not be allowed to drive  home.  Name and phone number of your driver:  Special Instructions: Shower using CHG 2 nights before surgery and the night before surgery.  If you shower the day of surgery use CHG.  Use special wash - you have one bottle of CHG for all showers.  You should use approximately 1/3 of the bottle for each shower.   Please read over the following fact sheets that you were given: Pain Booklet, Coughing and Deep Breathing, MRSA Information and Surgical Site Infection Prevention

## 2012-09-30 NOTE — Progress Notes (Signed)
Patient to go to [facility] at 8am and come to short stay when finished .  req' notes, ekg, tests fro dr c at [facility] from 08/06/12

## 2012-10-01 MED ORDER — CIPROFLOXACIN IN D5W 400 MG/200ML IV SOLN
400.0000 mg | INTRAVENOUS | Status: AC
  Administered 2012-10-02: 400 mg via INTRAVENOUS
  Filled 2012-10-01: qty 200

## 2012-10-02 ENCOUNTER — Encounter (HOSPITAL_COMMUNITY): Payer: Self-pay | Admitting: Vascular Surgery

## 2012-10-02 ENCOUNTER — Encounter (HOSPITAL_COMMUNITY)
Admission: RE | Disposition: A | Discharge status: Home / Self Care | Payer: Self-pay | Source: Ambulatory Visit | Attending: Surgery

## 2012-10-02 ENCOUNTER — Encounter (HOSPITAL_COMMUNITY): Payer: Self-pay | Admitting: Surgery

## 2012-10-02 ENCOUNTER — Other Ambulatory Visit (INDEPENDENT_AMBULATORY_CARE_PROVIDER_SITE_OTHER): Payer: Self-pay | Admitting: Surgery

## 2012-10-02 ENCOUNTER — Ambulatory Visit (HOSPITAL_COMMUNITY): Payer: Medicare Other | Admitting: Anesthesiology

## 2012-10-02 ENCOUNTER — Ambulatory Visit (HOSPITAL_COMMUNITY)
Admission: RE | Admit: 2012-10-02 | Discharge: 2012-10-02 | Disposition: A | Discharge status: Home / Self Care | Payer: Medicare Other | Source: Ambulatory Visit | Attending: Surgery | Admitting: Surgery

## 2012-10-02 DIAGNOSIS — N6029 Fibroadenosis of unspecified breast: Secondary | ICD-10-CM | POA: Insufficient documentation

## 2012-10-02 DIAGNOSIS — N62 Hypertrophy of breast: Secondary | ICD-10-CM | POA: Insufficient documentation

## 2012-10-02 DIAGNOSIS — I10 Essential (primary) hypertension: Secondary | ICD-10-CM | POA: Insufficient documentation

## 2012-10-02 DIAGNOSIS — N6099 Unspecified benign mammary dysplasia of unspecified breast: Secondary | ICD-10-CM

## 2012-10-02 DIAGNOSIS — Z803 Family history of malignant neoplasm of breast: Secondary | ICD-10-CM | POA: Insufficient documentation

## 2012-10-02 DIAGNOSIS — D249 Benign neoplasm of unspecified breast: Secondary | ICD-10-CM

## 2012-10-02 HISTORY — PX: BREAST LUMPECTOMY WITH NEEDLE LOCALIZATION: SHX5759

## 2012-10-02 SURGERY — BREAST LUMPECTOMY WITH NEEDLE LOCALIZATION
Anesthesia: General | Site: Breast | Laterality: Right | Wound class: Clean

## 2012-10-02 MED ORDER — LACTATED RINGERS IV SOLN
INTRAVENOUS | Status: DC
  Administered 2012-10-02: 10:00:00 via INTRAVENOUS

## 2012-10-02 MED ORDER — PROPOFOL 10 MG/ML IV BOLUS
INTRAVENOUS | Status: DC | PRN
  Administered 2012-10-02: 120 mg via INTRAVENOUS

## 2012-10-02 MED ORDER — FENTANYL CITRATE 0.05 MG/ML IJ SOLN: 25.0000 ug | INTRAMUSCULAR | Status: DC | PRN

## 2012-10-02 MED ORDER — 0.9 % SODIUM CHLORIDE (POUR BTL) OPTIME
TOPICAL | Status: DC | PRN
  Administered 2012-10-02: 1000 mL

## 2012-10-02 MED ORDER — EPHEDRINE SULFATE 50 MG/ML IJ SOLN
INTRAMUSCULAR | Status: DC | PRN
  Administered 2012-10-02: 5 mg via INTRAVENOUS
  Administered 2012-10-02 (×2): 10 mg via INTRAVENOUS

## 2012-10-02 MED ORDER — LACTATED RINGERS IV SOLN
INTRAVENOUS | Status: DC | PRN
  Administered 2012-10-02: 10:00:00 via INTRAVENOUS

## 2012-10-02 MED ORDER — ONDANSETRON HCL 4 MG/2ML IJ SOLN
INTRAMUSCULAR | Status: DC | PRN
  Administered 2012-10-02: 4 mg via INTRAVENOUS

## 2012-10-02 MED ORDER — BUPIVACAINE-EPINEPHRINE 0.25% -1:200000 IJ SOLN
INTRAMUSCULAR | Status: AC
  Filled 2012-10-02: qty 1

## 2012-10-02 MED ORDER — SCOPOLAMINE 1 MG/3DAYS TD PT72
MEDICATED_PATCH | TRANSDERMAL | Status: AC
  Administered 2012-10-02: 1 via TRANSDERMAL
  Filled 2012-10-02: qty 1

## 2012-10-02 MED ORDER — FENTANYL CITRATE 0.05 MG/ML IJ SOLN
INTRAMUSCULAR | Status: DC | PRN
  Administered 2012-10-02 (×2): 25 ug via INTRAVENOUS
  Administered 2012-10-02: 50 ug via INTRAVENOUS

## 2012-10-02 MED ORDER — GLYCOPYRROLATE 0.2 MG/ML IJ SOLN
INTRAMUSCULAR | Status: DC | PRN
  Administered 2012-10-02: 0.2 mg via INTRAVENOUS

## 2012-10-02 MED ORDER — BUPIVACAINE-EPINEPHRINE 0.25% -1:200000 IJ SOLN
INTRAMUSCULAR | Status: DC | PRN
  Administered 2012-10-02: 20 mL

## 2012-10-02 MED ORDER — ONDANSETRON HCL 4 MG/2ML IJ SOLN
INTRAMUSCULAR | Status: AC
  Administered 2012-10-02: 4 mg
  Filled 2012-10-02: qty 2

## 2012-10-02 MED ORDER — TRAMADOL HCL 50 MG PO TABS: 50.0000 mg | ORAL_TABLET | Freq: Four times a day (QID) | ORAL | Status: DC | PRN

## 2012-10-02 MED ORDER — ONDANSETRON HCL 4 MG/2ML IJ SOLN: 4.0000 mg | Freq: Four times a day (QID) | INTRAMUSCULAR | Status: DC | PRN

## 2012-10-02 MED ORDER — LIDOCAINE HCL (CARDIAC) 20 MG/ML IV SOLN
INTRAVENOUS | Status: DC | PRN
  Administered 2012-10-02: 80 mg via INTRAVENOUS

## 2012-10-02 SURGICAL SUPPLY — 41 items
BENZOIN TINCTURE PRP APPL 2/3 (GAUZE/BANDAGES/DRESSINGS) ×2 IMPLANT
BLADE SURG 10 STRL SS (BLADE) IMPLANT
BLADE SURG 15 STRL LF DISP TIS (BLADE) ×1 IMPLANT
BLADE SURG 15 STRL SS (BLADE) ×1
CANISTER SUCTION 2500CC (MISCELLANEOUS) ×2 IMPLANT
CHLORAPREP W/TINT 26ML (MISCELLANEOUS) ×2 IMPLANT
CLOTH BEACON ORANGE TIMEOUT ST (SAFETY) ×2 IMPLANT
COVER SURGICAL LIGHT HANDLE (MISCELLANEOUS) ×2 IMPLANT
DEVICE DUBIN SPECIMEN MAMMOGRA (MISCELLANEOUS) ×2 IMPLANT
DRAPE CHEST BREAST 15X10 FENES (DRAPES) ×2 IMPLANT
DRSG TEGADERM 4X4.75 (GAUZE/BANDAGES/DRESSINGS) ×2 IMPLANT
ELECT CAUTERY BLADE 6.4 (BLADE) ×2 IMPLANT
ELECT REM PT RETURN 9FT ADLT (ELECTROSURGICAL) ×2
ELECTRODE REM PT RTRN 9FT ADLT (ELECTROSURGICAL) ×1 IMPLANT
GLOVE BIOGEL PI IND STRL 7.0 (GLOVE) ×1 IMPLANT
GLOVE BIOGEL PI INDICATOR 7.0 (GLOVE) ×1
GLOVE SURG SIGNA 7.5 PF LTX (GLOVE) ×2 IMPLANT
GLOVE SURG SS PI 7.0 STRL IVOR (GLOVE) ×2 IMPLANT
GOWN PREVENTION PLUS XLARGE (GOWN DISPOSABLE) ×2 IMPLANT
GOWN STRL NON-REIN LRG LVL3 (GOWN DISPOSABLE) ×2 IMPLANT
KIT BASIN OR (CUSTOM PROCEDURE TRAY) ×2 IMPLANT
KIT MARKER MARGIN INK (KITS) ×2 IMPLANT
KIT ROOM TURNOVER OR (KITS) ×2 IMPLANT
NEEDLE HYPO 25GX1X1/2 BEV (NEEDLE) ×2 IMPLANT
NS IRRIG 1000ML POUR BTL (IV SOLUTION) ×2 IMPLANT
PACK SURGICAL SETUP 50X90 (CUSTOM PROCEDURE TRAY) ×2 IMPLANT
PAD ARMBOARD 7.5X6 YLW CONV (MISCELLANEOUS) ×2 IMPLANT
PENCIL BUTTON HOLSTER BLD 10FT (ELECTRODE) ×2 IMPLANT
SPONGE GAUZE 4X4 12PLY (GAUZE/BANDAGES/DRESSINGS) ×2 IMPLANT
SPONGE LAP 4X18 X RAY DECT (DISPOSABLE) ×2 IMPLANT
STRIP CLOSURE SKIN 1/2X4 (GAUZE/BANDAGES/DRESSINGS) ×2 IMPLANT
SUT MON AB 4-0 PC3 18 (SUTURE) ×2 IMPLANT
SUT SILK 2 0 SH (SUTURE) IMPLANT
SUT VIC AB 3-0 SH 27 (SUTURE) ×1
SUT VIC AB 3-0 SH 27XBRD (SUTURE) ×1 IMPLANT
SYR BULB 3OZ (MISCELLANEOUS) ×2 IMPLANT
SYR CONTROL 10ML LL (SYRINGE) ×2 IMPLANT
TOWEL OR 17X24 6PK STRL BLUE (TOWEL DISPOSABLE) IMPLANT
TOWEL OR 17X26 10 PK STRL BLUE (TOWEL DISPOSABLE) ×2 IMPLANT
TUBE CONNECTING 12X1/4 (SUCTIONS) ×2 IMPLANT
YANKAUER SUCT BULB TIP NO VENT (SUCTIONS) ×2 IMPLANT

## 2012-10-02 NOTE — Transfer of Care (Signed)
Immediate Anesthesia Transfer of Care Note  Patient: Claudia [name]  Procedure(s) Performed: Procedure(s): BREAST LUMPECTOMY WITH NEEDLE LOCALIZATION (Right)  Patient Location: PACU  Anesthesia TypeGA LMA Level of Consciousness: awake, alert  and oriented  Airway & Oxygen Therapy: Patient Spontanous Breathing and Patient connected to nasal cannula oxygen  Post-op Assessment: Report given to PACU RN and Post -op Vital signs reviewed and stable  Post vital signs: Reviewed and stable  Complications: No apparent anesthesia complications

## 2012-10-02 NOTE — Interval H&P Note (Signed)
History and Physical Interval Note:  10/02/2012 9:20 AM  Claudia [name]  has presented today for surgery, with the diagnosis of abnormal lesion right breast   The various methods of treatment have been discussed with the patient and family. After consideration of risks, benefits and other options for treatment, the patient has consented to  Procedure(s) with comments: BREAST LUMPECTOMY WITH NEEDLE LOCALIZATION (Right) - [facility] NEEDLE LOCALIZATION 8:00 AM  as a surgical intervention .  The patient's history has been reviewed, patient examined, no change in status, stable for surgery.  I have reviewed the patient's chart and labs.  Questions were answered to the patient's satisfaction.     Weyman Bogdon A

## 2012-10-02 NOTE — Anesthesia Preprocedure Evaluation (Signed)
Anesthesia Evaluation  Patient identified by MRN, date of birth, ID band Patient awake    Reviewed: Allergy & Precautions, H&P , NPO status , Patient's Chart, lab work & pertinent test results  History of Anesthesia Complications (+) PONV  Airway Mallampati: II  Neck ROM: full    Dental   Pulmonary shortness of breath, asthma , pneumonia -, COPDformer smoker,          Cardiovascular hypertension, + dysrhythmias Atrial Fibrillation     Neuro/Psych    GI/Hepatic   Endo/Other    Renal/GU      Musculoskeletal   Abdominal   Peds  Hematology   Anesthesia Other Findings   Reproductive/Obstetrics                           Anesthesia Physical Anesthesia Plan  ASA: III  Anesthesia Plan: General   Post-op Pain Management:    Induction: Intravenous  Airway Management Planned: LMA  Additional Equipment:   Intra-op Plan:   Post-operative Plan:   Informed Consent: I have reviewed the patients History and Physical, chart, labs and discussed the procedure including the risks, benefits and alternatives for the proposed anesthesia with the patient or authorized representative who has indicated his/her understanding and acceptance.     Plan Discussed with: CRNA and Surgeon  Anesthesia Plan Comments:         Anesthesia Quick Evaluation

## 2012-10-02 NOTE — Op Note (Signed)
BREAST LUMPECTOMY WITH NEEDLE LOCALIZATION  Procedure Note  Claudia [name] 10/02/2012   Pre-op Diagnosis: ABNORMAL LESION RIGHT BREAST     Post-op Diagnosis: same  Procedure(s): RIGHT BREAST LUMPECTOMY WITH NEEDLE LOCALIZATION  Surgeon(s): Shelly Rubenstein, MD  Anesthesia: General  Staff:  Circulator: Pauletta Browns, RN Scrub Person: Leighton Parody  Estimated Blood Loss: Minimal               Specimens: sent to path          [facility]   Date: 10/02/2012  Time: 10:43 AM

## 2012-10-02 NOTE — H&P (Signed)
Patient ID: Claudia [name], female DOB: 1932-11-23, 79 y.o. MRN: [id number]  Chief Complaint   Patient presents with   .  New Evaluation     Rt breast abnormal cells   HPI  Claudia [name] is a 79 y.o. female.  HPI  She is referred here by Dr. Yolanda Bonine after recent mammograms demonstrated an abnormality in the right breast. She has since had a stereotactic biopsy of the right breast confirming a complex sclerosing lesion with ductal hyperplasia. She denies discharge from the nipples. She has had no previous problems with her breast. She does have a sister who has been diagnosed with stage IV breast cancer. She is otherwise without complaints today  Past Medical History   Diagnosis  Date   .  Paroxysmal atrial flutter    .  Abnormal chest x-ray    .  COPD (chronic obstructive pulmonary disease)     Past Surgical History   Procedure  Laterality  Date   .  Tonsillectomy     .  Cataract extraction     .  Tubal ligation      Family History   Problem  Relation  Age of Onset   .  Bone cancer  Mother    .  Cancer  Mother      bone   .  Bone cancer  Father    .  Lung cancer  Father    .  Cancer  Father      Lung   .  Hyperlipidemia  Father    .  Hypertension  Father    .  Hypertension  Sister    .  Cancer  Sister      breast   .  Hyperlipidemia  Sister    .  Hyperlipidemia  Sister    .  Alzheimer's disease  Sister    .  Hypertension  Sister    .  Breast cancer  Sister    .  Hyperlipidemia  Brother    .  Hypertension  Brother    Social History  History   Substance Use Topics   .  Smoking status:  Former Smoker -- 2.00 packs/day for 25 years     Types:  Cigarettes     Quit date:  08/15/1987   .  Smokeless tobacco:  Not on file   .  Alcohol Use:  Not on file    Allergies   Allergen  Reactions   .  Augmentin (Amoxicillin-Pot Clavulanate)  Shortness Of Breath   .  Doxycycline  Shortness Of Breath   .  Amiodarone Hcl    .  Amoxicillin-Pot Clavulanate    .  Clarithromycin     .  Clonazepam    .  Codeine    .  Diltiazem Hcl    .  Flecainide    .  Nitrofurantoin    .  Rosuvastatin    .  Zetia (Ezetimibe)     Current Outpatient Prescriptions   Medication  Sig  Dispense  Refill   .  albuterol (PROAIR HFA) 108 (90 BASE) MCG/ACT inhaler  Inhale 2 puffs into the lungs every 6 (six) hours as needed for wheezing or shortness of breath.  1 Inhaler  prn   .  ALPRAZolam (XANAX) 0.25 MG tablet  Take 0.25 mg by mouth 3 (three) times daily as needed.     Marland Kitchen  aspirin 81 MG tablet  Take 81 mg by mouth daily.     Marland Kitchen  losartan (COZAAR) 100 MG tablet  Take 100 mg by mouth daily.     .  sotalol (BETAPACE) 80 MG tablet  Take 80 mg by mouth 2 (two) times daily.     Marland Kitchen  tiotropium (SPIRIVA) 18 MCG inhalation capsule  Place 1 capsule (18 mcg total) into inhaler and inhale daily.  30 capsule  prn   .  warfarin (COUMADIN) 1 MG tablet  Take by mouth as directed.     Marland Kitchen  amLODipine (NORVASC) 5 MG tablet  Take 5 mg by mouth daily.      No current facility-administered medications for this visit.   Review of Systems  Review of Systems  Constitutional: Negative for fever, chills and unexpected weight change.  HENT: Negative for hearing loss, congestion, sore throat, trouble swallowing and voice change.  Eyes: Negative for visual disturbance.  Respiratory: Positive for shortness of breath. Negative for cough and wheezing.  Cardiovascular: Negative for chest pain, palpitations and leg swelling.  Gastrointestinal: Negative for nausea, vomiting, abdominal pain, diarrhea, constipation, blood in stool, abdominal distention and anal bleeding.  Genitourinary: Negative for hematuria, vaginal bleeding and difficulty urinating.  Musculoskeletal: Positive for arthralgias.  Skin: Negative for rash and wound.  Neurological: Negative for seizures, syncope and headaches.  Hematological: Negative for adenopathy. Does not bruise/bleed easily.  Psychiatric/Behavioral: Negative for confusion.  Blood  pressure 128/82, pulse 78, temperature 98 F (36.7 C), resp. rate 18, height 5\' 3"  (1.6 m), weight 139 lb (63.05 kg).  Physical Exam  Physical Exam  Constitutional: She is oriented to person, place, and time. She appears well-developed and well-nourished. No distress.  HENT:  Head: Normocephalic and atraumatic.  Right Ear: External ear normal.  Left Ear: External ear normal.  Nose: Nose normal.  Mouth/Throat: Oropharynx is clear and moist.  Eyes: Conjunctivae are normal. Pupils are equal, round, and reactive to light. Right eye exhibits no discharge. Left eye exhibits no discharge. No scleral icterus.  Neck: Normal range of motion. Neck supple. No tracheal deviation present.  Cardiovascular: Normal rate, regular rhythm, normal heart sounds and intact distal pulses.  No murmur heard.  Pulmonary/Chest: Effort normal and breath sounds normal. No respiratory distress. She has no wheezes.  Abdominal: Soft. Bowel sounds are normal.  Musculoskeletal: Normal range of motion. She exhibits no edema and no tenderness.  Lymphadenopathy:  She has no cervical adenopathy.  Neurological: She is alert and oriented to person, place, and time.  Skin: Skin is warm and dry. No rash noted. She is not diaphoretic. No erythema.  Psychiatric: Her behavior is normal. Judgment normal.  Breasts: Her right breast shows mild ecchymosis from her biopsy. There is no palpable mass. There is no axillary adenopathy  Data Reviewed  I reviewed her mammograms and pathology report  Assessment  Complex sclerosing lesion with ductal hyperplasia of the right breast in the upper outer quadrant  Plan  Needle localized lumpectomy is recommended to rule out malignancy. I've discussed this with her and her husband in detail. I discussed the risks of surgery which includes but is not limited to bleeding, infection, need for further surgery should malignancy be present, etc. They understand and wish to proceed. Her Coumadin and aspirin  will be held 5 days preoperatively. Likelihood of success is good

## 2012-10-02 NOTE — Anesthesia Postprocedure Evaluation (Signed)
Anesthesia Post Note  Patient: Claudia [name]  Procedure(s) Performed: Procedure(s) (LRB): BREAST LUMPECTOMY WITH NEEDLE LOCALIZATION (Right)  Anesthesia type: General  Patient location: PACU  Post pain: Pain level controlled and Adequate analgesia  Post assessment: Post-op Vital signs reviewed, Patient's Cardiovascular Status Stable, Respiratory Function Stable, Patent Airway and Pain level controlled  Last Vitals:  Filed Vitals:   10/02/12 1130  BP:   Pulse: 66  Temp:   Resp: 19    Post vital signs: Reviewed and stable  Level of consciousness: awake, alert  and oriented  Complications: No apparent anesthesia complications

## 2012-10-02 NOTE — Preoperative (Signed)
Beta Blockers   Reason not to administer Beta Blockers:Not Applicable 

## 2012-10-03 NOTE — Op Note (Signed)
Claudia [name], Claudia [name]                ACCOUNT NO.:  [id number]  MEDICAL RECORD NO.:  [id number]  LOCATION:                                 FACILITY:  PHYSICIAN:  Abigail Miyamoto, M.D. DATE OF BIRTH:  Apr 02, 1933  DATE OF PROCEDURE:  10/02/2012 DATE OF DISCHARGE:                              OPERATIVE REPORT   PREOPERATIVE DIAGNOSIS:  Abnormal hyperplasia with sclerosing lesion, right breast.  POSTOPERATIVE DIAGNOSIS:  Abnormal hyperplasia with sclerosing lesion, right breast.  PROCEDURE:  Needle localized right breast lumpectomy.  SURGEON:  Abigail Miyamoto, MD  ANESTHESIA:  General 0.25% Marcaine.  ESTIMATED BLOOD LOSS:  Minimal.  INDICATIONS:  This is a 79 year old female who was found to have an abnormal area on screening mammography of the right breast. Stereotactic biopsy showed a sclerosing lesion with hyperplasia, decision was made to proceed with needle localized lumpectomy to rule out malignancy.  PROCEDURE IN DETAIL:  The patient was brought to the operating room, identified as [name].  She had already had a localization wire placed in the right breast at [facility].  She was placed supine on the operating room table and general anesthesia was induced.  The localization wire was slightly shortened.  Her right breast was then prepped and draped in usual sterile fashion.  I anesthetized the skin at the entrance site in the upper outer quadrant with Marcaine.  I then made elliptical incision around the wire.  I took this down to the breast tissue with electrocautery.  I then performed a wide lumpectomy, achieving wide adequate margins around the abnormal area with the electrocautery.  I took this all the way down to the chest wall.  The complete lumpectomy was then performed with the electrocautery.  Again wide margins appeared to be achieved.  X-ray was then performed and confirmed the suspicious area was indeed in the biopsy specimen.  The specimen was then sent  to Pathology for evaluation.  I anesthetized the wound further with Marcaine.  I irrigated with saline.  Hemostasis was achieved with the cautery.  I closed the subcutaneous tissue with interrupted 3-0 Vicryl sutures and closed the skin with a running 4-0 Monocryl.  Steri-Strips, gauze, and Tegaderm were then applied.  The patient tolerated the procedure well.  All the counts were correct at the end of the procedure.  The patient was then extubated in the operating room and taken in a stable condition to the recovery room.     Abigail Miyamoto, M.D.     DB/MEDQ  D:  10/02/2012  T:  10/02/2012  Job:  [id number]

## 2012-10-04 ENCOUNTER — Encounter (HOSPITAL_COMMUNITY): Payer: Self-pay | Admitting: Surgery

## 2012-10-08 ENCOUNTER — Encounter (INDEPENDENT_AMBULATORY_CARE_PROVIDER_SITE_OTHER): Payer: Self-pay

## 2012-10-15 ENCOUNTER — Ambulatory Visit (INDEPENDENT_AMBULATORY_CARE_PROVIDER_SITE_OTHER): Payer: Medicare Other | Admitting: Surgery

## 2012-10-15 ENCOUNTER — Encounter (INDEPENDENT_AMBULATORY_CARE_PROVIDER_SITE_OTHER): Payer: Self-pay | Admitting: Surgery

## 2012-10-15 VITALS — BP 138/66 | HR 64 | Temp 97.2°F | Resp 20 | Ht 62.0 in | Wt 139.0 lb

## 2012-10-15 DIAGNOSIS — Z09 Encounter for follow-up examination after completed treatment for conditions other than malignant neoplasm: Secondary | ICD-10-CM

## 2012-10-15 NOTE — Progress Notes (Signed)
Subjective:     Patient ID: Claudia [name], female   DOB: Oct 30, 1932, 79 y.o.   MRN: [id number]  HPI She is here for her first postop visit status post right breast lobectomy. She is doing well and has no complaints.  Review of Systems     Objective:   Physical Exam On exam, her incision is healing well. The final pathology showed a lobular neoplasia with atypical hyperplasia but no evidence of malignancy. Margins were negative    Assessment:     Patient stable postop     Plan:     She may resume normal activity. She will continue her yearly mammograms. I will see her back as needed

## 2012-10-30 ENCOUNTER — Ambulatory Visit: Payer: Self-pay | Admitting: Cardiovascular Disease

## 2012-10-30 DIAGNOSIS — I4892 Unspecified atrial flutter: Secondary | ICD-10-CM

## 2012-10-30 DIAGNOSIS — Z7901 Long term (current) use of anticoagulants: Secondary | ICD-10-CM | POA: Insufficient documentation

## 2012-12-03 ENCOUNTER — Encounter: Payer: Self-pay | Admitting: Cardiovascular Disease

## 2012-12-13 ENCOUNTER — Encounter (INDEPENDENT_AMBULATORY_CARE_PROVIDER_SITE_OTHER): Payer: Self-pay

## 2012-12-19 ENCOUNTER — Other Ambulatory Visit: Payer: Self-pay | Admitting: Neurology

## 2012-12-31 ENCOUNTER — Encounter: Payer: Self-pay | Admitting: Internal Medicine

## 2012-12-31 ENCOUNTER — Ambulatory Visit (INDEPENDENT_AMBULATORY_CARE_PROVIDER_SITE_OTHER): Payer: Medicare Other | Admitting: Internal Medicine

## 2012-12-31 VITALS — BP 130/68 | HR 64 | Ht 61.0 in | Wt 139.8 lb

## 2012-12-31 DIAGNOSIS — J449 Chronic obstructive pulmonary disease, unspecified: Secondary | ICD-10-CM

## 2012-12-31 NOTE — Progress Notes (Signed)
Patient ID: Claudia [name], female   DOB: [birth year], 79 y.o.   MRN: [id number]  HPI 12/26/10- 77 yoF follwed for COPD/ asthma, rhinitis,  complicated by atrial flutter. Last here July 04, 2010 when we reviewed PFT with moderate obstruction, normal .  Last CXR was at hospital in Feb for atrial fib, showing CE, COPD, NAD.  Breathing was "rough" with increased shortness of breath w/o wheeze in March. Nose was running. She doesn't know if it was pollen or something about a heart med, but it got better. Throat feels full, lying in bed at night. Admits heartburn.Short of breath with steady walking and stairs. Spiriva helps.   06/28/11- 77 yoF follwed for COPD/ asthma, rhinitis,  complicated by atrial flutter. Has had flu vax. Feels comfortable, accepting unchanged dyspnea on exertion with house cleaning or brisk walks. Paces herself ok. Afib stays controlled/ Dr Clarene Duke. Accepts cost of Spiriva and finds she needs it almost every day. She wanted to take it every other day to cut costs, but can't get away with that without a decline in her breathing. Med refills discussed.   11/01/11- 77 yoF follwed for COPD/ asthma, rhinitis,  complicated by atrial flutter. 2 weeks ago went to urgent care for head congestion. Given antihistamine and steroid nasal sprays which did not help. Still feels stopped up with no headache. Now for 2 days has been coughing productive yellow. Denies fever, sneeze, itch, sore throat. Slight wheeze. She smoked 2 packs per day until 1989. Using home oxygen 2 L for sleep. Doesn't know name of her DME company.  12/27/11- 78 yoF follwed for COPD/ asthma, rhinitis,  complicated by atrial flutter. Slight wheezing, cough, congestion, and SOB at times-depends on what patient is doing Resolved bronchitis after last visit. She had another episode of acute bronchitis with her husband in early April. Now feels well. Oxygen 2 L for sleep. Rare need for rescue inhaler. Continues  Spiriva.  07/02/12- 79 yoF followed for COPD/ asthma, rhinitis,  complicated by atrial flutter. FOLLOWS FOR: breathing doing good overall unless active-walking or cleaning house; uses [employer] for [employer] O2-needs to have yearly check for insurance purposes to pay for O2 2L/ sleep/ [employer]. Has had pneumonia vaccine after age 77. Had flu vaccine. She feels well today. Discussed cost of Spiriva. Dyspnea on exertion climbing stairs, cleaning house or doing laundry. CXR 12/28/11 IMPRESSION:  Emphysema right upper lobe scar. No acute finding. Stable  compared to prior exam.  Original Report Authenticated By: Bernadene Bell. D'ALESSIO, M.D.    12/31/12- 12 yoF followed for COPD/ asthma, rhinitis,  complicated by atrial flutter. FOLLOWS FOR: SOB with activity, otherwise doing well. Pt did not have ONO that was ordered at last visit 07-02-12. O2 2L/ [employer]. No change in her breathing. Seldom dry cough. Occasional rescue inhaler. Dyspnea on exertion. No chest pain or swelling. Had benign lumpectomy CXR 09/30/12 IMPRESSION:  Stable chronic lung disease. No acute cardiopulmonary abnormality.  Original Report Authenticated By: Erskine Speed, M.D.  Review of Systems-see HPI Constitutional:   No-   weight loss, night sweats, fevers, chills, fatigue, lassitude. HEENT:   No-  headaches, difficulty swallowing, tooth/dental problems, sore throat,       No-  sneezing, itching, ear ache, + nasal congestion, post nasal drip,  CV:  No-   chest pain, orthopnea, PND, swelling in lower extremities, anasarca,   dizziness, palpitations Resp: + shortness of breath with exertion or at rest.  No-productive cough,  No non-productive cough,  No- coughing up of blood.   .  Skin: No-   rash or lesions. GI:  No-   heartburn, indigestion, abdominal pain, nausea, vomiting,  GU: MS:  No-   joint pain or swelling. Neuro-     nothing unusual Psych:  No- change in mood or affect. No depression or anxiety.  No memory loss.     Objective:   Physical Exam General- Alert, Oriented, Affect-appropriate, Distress- none acute Skin- rash-none, lesions- none, excoriation- none Lymphadenopathy- none Head- atraumatic            Eyes- Gross vision intact, PERRLA, conjunctivae clear secretions            Ears- Hearing, canals-normal            Nose- Clear, +Septal dev/ external deviation to right, No- mucus, polyps, erosion, perforation             Throat- Mallampati II , mucosa clear , drainage- none, tonsils- atrophic, dentures Neck- flexible , trachea midline, no stridor , thyroid nl, carotid no bruit Chest - symmetrical excursion , unlabored           Heart/CV- RRR( no pacemaker) , no murmur , no gallop  , no rub, nl s1 s2                           - JVD- none , edema- none, stasis changes- none, varices- none           Lung- + diminished but clear to P&A, no- cough , dullness-none, rub- none           Chest wall- + kyphosis Abd-  Br/ Gen/ Rectal- Not done, not indicated Extrem- cyanosis- none, clubbing, none, atrophy- none, strength- nl Neuro- grossly intact to observation

## 2012-12-31 NOTE — Patient Instructions (Addendum)
Please call as needed 

## 2013-01-03 ENCOUNTER — Other Ambulatory Visit: Payer: Self-pay

## 2013-01-03 MED ORDER — LOSARTAN POTASSIUM 100 MG PO TABS: 100.0000 mg | ORAL_TABLET | Freq: Every day | ORAL | Status: DC

## 2013-01-03 NOTE — Telephone Encounter (Signed)
Rx was sent to pharmacy electronically. 

## 2013-01-07 ENCOUNTER — Other Ambulatory Visit: Payer: Self-pay | Admitting: *Deleted

## 2013-01-07 MED ORDER — LOSARTAN POTASSIUM 100 MG PO TABS: 50.0000 mg | ORAL_TABLET | Freq: Every day | ORAL | Status: DC

## 2013-01-12 NOTE — Assessment & Plan Note (Signed)
Controlled. Does need her rescue inhaler and continues to use oxygen for sleep.

## 2013-01-30 ENCOUNTER — Ambulatory Visit (INDEPENDENT_AMBULATORY_CARE_PROVIDER_SITE_OTHER): Payer: Medicare Other | Admitting: Cardiovascular Disease

## 2013-01-30 ENCOUNTER — Encounter: Payer: Self-pay | Admitting: Cardiovascular Disease

## 2013-01-30 ENCOUNTER — Ambulatory Visit (INDEPENDENT_AMBULATORY_CARE_PROVIDER_SITE_OTHER): Payer: Medicare Other | Admitting: Pharmacist Clinician (PhC)/ Clinical Pharmacy Specialist

## 2013-01-30 VITALS — BP 143/67 | HR 60 | Resp 20 | Ht 62.0 in | Wt 136.9 lb

## 2013-01-30 DIAGNOSIS — I10 Essential (primary) hypertension: Secondary | ICD-10-CM

## 2013-01-30 DIAGNOSIS — E785 Hyperlipidemia, unspecified: Secondary | ICD-10-CM

## 2013-01-30 DIAGNOSIS — I4892 Unspecified atrial flutter: Secondary | ICD-10-CM

## 2013-01-30 DIAGNOSIS — I480 Paroxysmal atrial fibrillation: Secondary | ICD-10-CM

## 2013-01-30 DIAGNOSIS — J4489 Other specified chronic obstructive pulmonary disease: Secondary | ICD-10-CM

## 2013-01-30 DIAGNOSIS — Z7901 Long term (current) use of anticoagulants: Secondary | ICD-10-CM

## 2013-01-30 DIAGNOSIS — I4891 Unspecified atrial fibrillation: Secondary | ICD-10-CM

## 2013-01-30 DIAGNOSIS — J449 Chronic obstructive pulmonary disease, unspecified: Secondary | ICD-10-CM

## 2013-01-30 NOTE — Patient Instructions (Addendum)
Please be aware of the potential for drug interactions with warfarin or sotalol when you receive new prescriptions. Please record your blood pressure and heart rate when he had an episode of fatigue and send [place] these recordings. Your physician recommends that you schedule a follow-up appointment in: 1 year

## 2013-02-01 ENCOUNTER — Encounter: Payer: Self-pay | Admitting: Cardiovascular Disease

## 2013-02-01 DIAGNOSIS — E785 Hyperlipidemia, unspecified: Secondary | ICD-10-CM | POA: Insufficient documentation

## 2013-02-01 DIAGNOSIS — I480 Paroxysmal atrial fibrillation: Secondary | ICD-10-CM | POA: Insufficient documentation

## 2013-02-01 DIAGNOSIS — I10 Essential (primary) hypertension: Secondary | ICD-10-CM | POA: Insufficient documentation

## 2013-02-01 NOTE — Assessment & Plan Note (Signed)
Is as mild and she does not have vascular disease. She is intolerant to statins.

## 2013-02-01 NOTE — Assessment & Plan Note (Signed)
Uses oxygen especially at night. Her symptoms have not progressed as far as I can tell.

## 2013-02-01 NOTE — Assessment & Plan Note (Signed)
Although her blood pressure is slightly high today she brought me a very detailed recording of her blood pressure over the last several weeks and the average blood pressure at home is in the 115/55 mmHg range. One can also see from her heart rates that she usually stays in the 50s. A couple of occasions her heart rate was in the 90s and I wonder whether these were spells of asymptomatic atrial fibrillation.

## 2013-02-01 NOTE — Progress Notes (Signed)
Patient ID: [name], female   DOB: 1932/12/03, 79 y.o.   MRN: [id number]      Reason for office visit Atrial fibrillation, hypertension  Mrs. Cogburn has long-standing problems with paroxysmal atrial flutter ablation but this is infrequent and minimally symptomatic. The underlying cause may be COPD. She has never had a stroke or TIA. She is on appropriate anticoagulation therapy. She often has elevated blood pressure when she comes to the office at home her blood pressures consistently low to normal.  No new significant health events have occurred since her last appointment  Allergies  Allergen Reactions  . Augmentin (Amoxicillin-Pot Clavulanate) Shortness Of Breath  . Doxycycline Shortness Of Breath  . Nitrofurantoin Shortness Of Breath  . Amiodarone Hcl Nausea And Vomiting  . Clarithromycin Other (See Comments)    Crazy feeling, confusion  . Clonazepam Other (See Comments)    Doesn't remember  . Codeine Nausea And Vomiting  . Diltiazem Hcl Nausea And Vomiting  . Fish Oil Nausea And Vomiting  . Flecainide Nausea Only  . Multaq (Dronedarone) Nausea Only  . Rosuvastatin Other (See Comments)    Leg cramps   . Zetia (Ezetimibe) Other (See Comments)    Leg cramps    Current Outpatient Prescriptions  Medication Sig Dispense Refill  . acetaminophen (TYLENOL) 325 MG tablet Take 650 mg by mouth every 6 (six) hours as needed for pain.      Marland Kitchen albuterol (PROAIR HFA) 108 (90 BASE) MCG/ACT inhaler Inhale 2 puffs into the lungs every 6 (six) hours as needed for wheezing or shortness of breath.  1 Inhaler  prn  . ALPRAZolam (XANAX) 0.25 MG tablet Take 1 tablet (0.25 mg total) by mouth 3 (three) times daily as needed.  90 tablet  5  . amLODipine (NORVASC) 5 MG tablet Take 5 mg by mouth daily.        Marland Kitchen aspirin 81 MG tablet Take 81 mg by mouth daily.        Marland Kitchen losartan (COZAAR) 100 MG tablet Take 0.5 tablets (50 mg total) by mouth daily.  30 tablet  11  . Polyvinyl Alcohol-Povidone  (REFRESH OP) Apply 1 drop to eye 4 (four) times daily as needed (dry eyes).      . sotalol (BETAPACE) 80 MG tablet Take 80 mg by mouth 2 (two) times daily.        Marland Kitchen tiotropium (SPIRIVA) 18 MCG inhalation capsule Place 1 capsule (18 mcg total) into inhaler and inhale daily.  30 capsule  prn  . warfarin (COUMADIN) 1 MG tablet Take 1-2 mg by mouth daily. 1mg  Monday, Tuesday, Saturday. 2mg  Wednesday, Thursday, Friday, and Sunday.       No current facility-administered medications for this visit.    Past Medical History  Diagnosis Date  . Paroxysmal atrial flutter 01/19/2009    echo - EF 55-60%; grade 1 diastolic dysfunction; mitral valve- calcified annlus, mildly dilated R ventricle and atrium  . Abnormal chest x-ray   . COPD (chronic obstructive pulmonary disease)   . Chest pain 01/19/2009    myoview stress - LVEF >75% no reversible ischemia or infarction  . Abdominal  pain, other specified site     [place] - negative abdominal ultrasound  . Hypertension   . Dyslipidemia   . PONV (postoperative nausea and vomiting)   . Pneumonia     hx    Past Surgical History  Procedure Laterality Date  . Tonsillectomy    . Cataract extraction    . Tubal  ligation    . Tonsillectomy    . Eye surgery    . Breast lumpectomy with needle localization Right 10/02/2012    Procedure: BREAST LUMPECTOMY WITH NEEDLE LOCALIZATION;  Surgeon: Shelly Rubenstein, MD;  Location: [facility] OR;  Service: General;  Laterality: Right;    Family History  Problem Relation Age of Onset  . Bone cancer Mother   . Cancer Mother     bone  . Bone cancer Father   . Lung cancer Father   . Cancer Father     Lung  . Hyperlipidemia Father   . Hypertension Father   . Hypertension Sister   . Cancer Sister     breast  . Hyperlipidemia Sister   . Hyperlipidemia Sister   . Alzheimer's disease Sister   . Hypertension Sister   . Breast cancer Sister   . Hyperlipidemia Brother   . Hypertension Brother     History   Social History    . Marital Status: Married    Spouse Name: N/A    Number of Children: 3  . Years of Education: N/A   Occupational History  . retired    Social History Main Topics  . Smoking status: Former Smoker -- 2.00 packs/day for 25 years    Types: Cigarettes    Quit date: 08/15/1987  . Smokeless tobacco: Not on file  . Alcohol Use: No  . Drug Use: No  . Sexually Active: Not on file   Other Topics Concern  . Not on file   Social History Narrative  . No narrative on file    Review of systems: She has dyspnea like exertion, cough and wheezing frequently The patient specifically denies any chest pain at rest or with exertion, dyspnea at rest, orthopnea, paroxysmal nocturnal dyspnea, syncope, palpitations, focal neurological deficits, intermittent claudication, lower extremity edema, unexplained weight gain.  The patient also denies abdominal pain, nausea, vomiting, dysphagia, diarrhea, constipation, polyuria, polydipsia, dysuria, hematuria, frequency, urgency, abnormal bleeding or bruising, fever, chills, unexpected weight changes, mood swings, change in skin or hair texture, change in voice quality, auditory or visual problems, allergic reactions or rashes, new musculoskeletal complaints other than usual "aches and pains".   PHYSICAL EXAM BP 143/67  Pulse 60  Resp 20  Ht 5\' 2"  (1.575 m)  Wt 136 lb 14.4 oz (62.097 kg)  BMI 25.03 kg/m2  General: Alert, oriented x3, no distress Head: no evidence of trauma, PERRL, EOMI, no exophtalmos or lid lag, no myxedema, no xanthelasma; normal ears, nose and oropharynx Neck: normal jugular venous pulsations and no hepatojugular reflux; brisk carotid pulses without delay and no carotid bruits Chest: Initial breath sounds throughout but otherwise clear to auscultation, no signs of consolidation by percussion or palpation, normal fremitus, symmetrical and full respiratory excursions Cardiovascular: normal position and quality of the apical impulse,  regular rhythm, normal first and second heart sounds, no murmurs, rubs or gallops Abdomen: no tenderness or distention, no masses by palpation, no abnormal pulsatility or arterial bruits, normal bowel sounds, no hepatosplenomegaly Extremities: no clubbing, cyanosis or edema; 2+ radial, ulnar and brachial pulses bilaterally; 2+ right femoral, posterior tibial and dorsalis pedis pulses; 2+ left femoral, posterior tibial and dorsalis pedis pulses; no subclavian or femoral bruits Neurological: grossly nonfocal   EKG:  normal sinus rhythm, QTC 444 ms  Lipid Panel  No results found for this basename: chol, trig, hdl, cholhdl, vldl, ldlcalc    BMET    Component Value Date/Time   NA 142 09/30/2012 1422  K 4.6 09/30/2012 1422   CL 102 09/30/2012 1422   CO2 30 09/30/2012 1422   GLUCOSE 99 09/30/2012 1422   BUN 14 09/30/2012 1422   CREATININE 0.95 09/30/2012 1422   CALCIUM 9.9 09/30/2012 1422   GFRNONAA 56* 09/30/2012 1422   GFRAA 65* 09/30/2012 1422     ASSESSMENT AND PLAN Paroxysmal atrial fibrillation No recent clinical occurrences. Notes intolerance to amiodarone multaq and flecainide. Currently well controlled on sotalol area to QTc interval is 444 ms today. She is reminded about the risk of drug interactions with other medications that may prolong the QT interval is asked to ask any prescribing physician or her pharmacist about such possible interactions whenever she receives any medication. She is on Procrit anticoagulate without any bleeding problems.  HTN (hypertension) Although her blood pressure is slightly high today she brought me a very detailed recording of her blood pressure over the last several weeks and the average blood pressure at home is in the 115/55 mmHg range. One can also see from her heart rates that she usually stays in the 50s. A couple of occasions her heart rate was in the 90s and I wonder whether these were spells of asymptomatic atrial fibrillation.  COPD with  asthma Uses oxygen especially at night. Her symptoms have not progressed as far as I can tell.  Hyperlipidemia Is as mild and she does not have vascular disease. She is intolerant to statins.  Orders Placed This Encounter  Procedures  . EKG 12-Lead   No orders of the defined types were placed in this encounter.    Junious Silk, MD, [facility] [phone] office [phone] pager

## 2013-02-01 NOTE — Assessment & Plan Note (Signed)
No recent clinical occurrences. Notes intolerance to amiodarone multaq and flecainide. Currently well controlled on sotalol area to QTc interval is 444 ms today. She is reminded about the risk of drug interactions with other medications that may prolong the QT interval is asked to ask any prescribing physician or her pharmacist about such possible interactions whenever she receives any medication. She is on Procrit anticoagulate without any bleeding problems.

## 2013-03-06 ENCOUNTER — Other Ambulatory Visit: Payer: Self-pay | Admitting: *Deleted

## 2013-03-06 ENCOUNTER — Ambulatory Visit (INDEPENDENT_AMBULATORY_CARE_PROVIDER_SITE_OTHER): Payer: Medicare Other | Admitting: Pharmacist Clinician (PhC)/ Clinical Pharmacy Specialist

## 2013-03-06 VITALS — BP 160/76 | HR 56

## 2013-03-06 DIAGNOSIS — I4892 Unspecified atrial flutter: Secondary | ICD-10-CM

## 2013-03-06 DIAGNOSIS — Z7901 Long term (current) use of anticoagulants: Secondary | ICD-10-CM

## 2013-03-06 MED ORDER — SOTALOL HCL 80 MG PO TABS: 80.0000 mg | ORAL_TABLET | Freq: Two times a day (BID) | ORAL | Status: DC

## 2013-03-06 NOTE — Telephone Encounter (Signed)
Rx was sent to pharmacy electronically. 

## 2013-04-17 ENCOUNTER — Ambulatory Visit (INDEPENDENT_AMBULATORY_CARE_PROVIDER_SITE_OTHER): Payer: Medicare Other | Admitting: Pharmacist Clinician (PhC)/ Clinical Pharmacy Specialist

## 2013-04-17 VITALS — BP 124/68 | HR 60

## 2013-04-17 DIAGNOSIS — Z7901 Long term (current) use of anticoagulants: Secondary | ICD-10-CM

## 2013-04-17 DIAGNOSIS — I4892 Unspecified atrial flutter: Secondary | ICD-10-CM

## 2013-05-29 ENCOUNTER — Ambulatory Visit (INDEPENDENT_AMBULATORY_CARE_PROVIDER_SITE_OTHER): Payer: Medicare Other | Admitting: Pharmacist Clinician (PhC)/ Clinical Pharmacy Specialist

## 2013-05-29 VITALS — BP 110/76 | HR 64

## 2013-05-29 DIAGNOSIS — Z7901 Long term (current) use of anticoagulants: Secondary | ICD-10-CM

## 2013-05-29 DIAGNOSIS — I4892 Unspecified atrial flutter: Secondary | ICD-10-CM

## 2013-06-17 ENCOUNTER — Encounter: Payer: Self-pay | Admitting: Nurse Practitioner

## 2013-06-17 ENCOUNTER — Ambulatory Visit (INDEPENDENT_AMBULATORY_CARE_PROVIDER_SITE_OTHER): Payer: Medicare Other | Admitting: Nurse Practitioner

## 2013-06-17 VITALS — BP 130/80 | HR 58 | Ht 62.0 in | Wt 138.0 lb

## 2013-06-17 DIAGNOSIS — R209 Unspecified disturbances of skin sensation: Secondary | ICD-10-CM

## 2013-06-17 DIAGNOSIS — G250 Essential tremor: Secondary | ICD-10-CM

## 2013-06-17 DIAGNOSIS — G252 Other specified forms of tremor: Secondary | ICD-10-CM | POA: Insufficient documentation

## 2013-06-17 MED ORDER — ALPRAZOLAM 0.25 MG PO TABS: 0.2500 mg | ORAL_TABLET | Freq: Three times a day (TID) | ORAL | Status: DC | PRN

## 2013-06-17 NOTE — Patient Instructions (Signed)
Will continue Alprazolam for tremor up to 3 times daily RX given to patient F/U yearly and prn

## 2013-06-17 NOTE — Progress Notes (Signed)
[facility]  PATIENT: Claudia [name] DOB: 01-29-1933   REASON FOR VISIT: Followup for essential tremor   HISTORY OF PRESENT ILLNESS: Claudia [name] is a 79 year old right-handed white female with a history of an essential tremor. The patient has done well since last seen, and she does not believe that her tremor has progressed much. The patient uses Xanax if needed, and she is satisfied with results with this medication. The patient has more issues with shortness of breath that limits her activities of daily living. The patient indicates that the tremors do not prevent her from doing anything. She indicates that she is tolerating the Xanax well without side effects. She uses oxygen at night for her COPD.     REVIEW OF SYSTEMS: Full 14 system review of systems performed and notable only for:  Constitutional: N/A  Cardiovascular: N/A  Ear/Nose/Throat: N/A  Skin: N/A  Eyes: N/A  Respiratory: Shortness of breath  Gastroitestinal: N/A  Hematology/Lymphatic: N/A  Endocrine: Feeling cold  Musculoskeletal:N/A  Allergy/Immunology: N/A  Neurological: tremor Psychiatric: N/A   ALLERGIES: Allergies  Allergen Reactions  . Augmentin [Amoxicillin-Pot Clavulanate] Shortness Of Breath  . Doxycycline Shortness Of Breath  . Nitrofurantoin Shortness Of Breath  . Amiodarone Hcl Nausea And Vomiting  . Clarithromycin Other (See Comments)    Crazy feeling, confusion  . Clonazepam Other (See Comments)    Doesn't remember  . Codeine Nausea And Vomiting  . Diltiazem Hcl Nausea And Vomiting  . Fish Oil Nausea And Vomiting  . Flecainide Nausea Only  . Multaq [Dronedarone] Nausea Only  . Rosuvastatin Other (See Comments)    Leg cramps   . Zetia [Ezetimibe] Other (See Comments)    Leg cramps    HOME MEDICATIONS: Outpatient Prescriptions Prior to Visit  Medication Sig Dispense Refill  . acetaminophen (TYLENOL) 325 MG tablet Take 650 mg by mouth every 6 (six) hours as needed  for pain.      Marland Kitchen albuterol (PROAIR HFA) 108 (90 BASE) MCG/ACT inhaler Inhale 2 puffs into the lungs every 6 (six) hours as needed for wheezing or shortness of breath.  1 Inhaler  prn  . ALPRAZolam (XANAX) 0.25 MG tablet Take 1 tablet (0.25 mg total) by mouth 3 (three) times daily as needed.  90 tablet  5  . amLODipine (NORVASC) 5 MG tablet Take 5 mg by mouth daily.        Marland Kitchen aspirin 81 MG tablet Take 81 mg by mouth daily.        Marland Kitchen losartan (COZAAR) 100 MG tablet Take 0.5 tablets (50 mg total) by mouth daily.  30 tablet  11  . Polyvinyl Alcohol-Povidone (REFRESH OP) Apply 1 drop to eye 4 (four) times daily as needed (dry eyes).      . sotalol (BETAPACE) 80 MG tablet Take 1 tablet (80 mg total) by mouth 2 (two) times daily.  60 tablet  11  . tiotropium (SPIRIVA) 18 MCG inhalation capsule Place 1 capsule (18 mcg total) into inhaler and inhale daily.  30 capsule  prn  . warfarin (COUMADIN) 1 MG tablet Take 1-2 mg by mouth daily. 1mg  Monday, Tuesday, Saturday. 2mg  Wednesday, Thursday, Friday, and Sunday.       No facility-administered medications prior to visit.    PAST MEDICAL HISTORY: Past Medical History  Diagnosis Date  . Paroxysmal atrial flutter 01/19/2009    echo - EF 55-60%; grade 1 diastolic dysfunction; mitral valve- calcified annlus, mildly dilated R ventricle and atrium  . Abnormal chest  x-ray   . COPD (chronic obstructive pulmonary disease)   . Chest pain 01/19/2009    myoview stress - LVEF >75% no reversible ischemia or infarction  . Abdominal  pain, other specified site     [place] - negative abdominal ultrasound  . Hypertension   . Dyslipidemia   . PONV (postoperative nausea and vomiting)   . Pneumonia     hx    PAST SURGICAL HISTORY: Past Surgical History  Procedure Laterality Date  . Tonsillectomy    . Cataract extraction    . Tubal ligation    . Tonsillectomy    . Eye surgery    . Breast lumpectomy with needle localization Right 10/02/2012    Procedure: BREAST LUMPECTOMY  WITH NEEDLE LOCALIZATION;  Surgeon: Shelly Rubenstein, MD;  Location: [facility] OR;  Service: General;  Laterality: Right;    FAMILY HISTORY: Family History  Problem Relation Age of Onset  . Bone cancer Mother   . Cancer Mother     bone  . Bone cancer Father   . Lung cancer Father   . Cancer Father     Lung  . Hyperlipidemia Father   . Hypertension Father   . Hypertension Sister   . Cancer Sister     breast  . Hyperlipidemia Sister   . Hyperlipidemia Sister   . Alzheimer's disease Sister   . Hypertension Sister   . Breast cancer Sister   . Hyperlipidemia Brother   . Hypertension Brother     SOCIAL HISTORY: History   Social History  . Marital Status: Married    Spouse Name: Onalee Hua    Number of Children: 3  . Years of Education: 12   Occupational History  . retired    Social History Main Topics  . Smoking status: Former Smoker -- 2.00 packs/day for 25 years    Types: Cigarettes    Quit date: 08/15/1987  . Smokeless tobacco: Never Used  . Alcohol Use: No  . Drug Use: No  . Sexual Activity: Not on file   Other Topics Concern  . Not on file   Social History Narrative   Patient lives with her husband Onalee Hua).   Patient has 3 children.   Patient is right-handed.   Patient drinks 1 10 oz of Coke daily.   Patient is retired.   Patient has a high school education.     PHYSICAL EXAM  Filed Vitals:   06/17/13 0912  BP: 143/62  Pulse: 58  Height: 5\' 2"  (1.575 m)  Weight: 138 lb (62.596 kg)   Body mass index is 25.23 kg/(m^2).  Generalized: Well developed, in no acute distress  Neurological examination   Mentation: Alert oriented to time, place, history taking. Follows all commands speech and language fluent  Cranial nerve II-XII: Pupils were equal round reactive to light extraocular movements were full, visual field were full on confrontational test. Facial sensation and strength were normal. hearing was intact to finger rubbing bilaterally. Uvula tongue  midline. head turning and shoulder shrug and were normal and symmetric.Tongue protrusion into cheek strength was normal. Motor: normal bulk and tone, full strength in the BUE, BLE, fine finger movements normal, no pronator drift. No focal weakness Coordination: finger-nose-finger, heel-to-shin bilaterally, no dysmetria. Minimal intention tremor with finger to nose. Reflexes: 1+ upper lower and symmetric  Gait and Station: Rising up from seated position without assistance, normal stance,  moderate stride, good arm swing, smooth turning, able to perform tiptoe, and heel walking without difficulty. Mildly unsteady  with tandem  DIAGNOSTIC DATA (LABS, IMAGING, TESTING) - I reviewed patient records, labs, notes, testing and imaging myself where available.  Lab Results  Component Value Date   WBC 7.8 09/30/2012   HGB 13.6 09/30/2012   HCT 42.4 09/30/2012   MCV 86.4 09/30/2012   PLT 235 09/30/2012      Component Value Date/Time   NA 142 09/30/2012 1422   K 4.6 09/30/2012 1422   CL 102 09/30/2012 1422   CO2 30 09/30/2012 1422   GLUCOSE 99 09/30/2012 1422   BUN 14 09/30/2012 1422   CREATININE 0.95 09/30/2012 1422   CALCIUM 9.9 09/30/2012 1422   PROT 5.0* 01/18/2009 0625   ALBUMIN 3.0* 01/18/2009 0625   AST 18 01/18/2009 0625   ALT 16 01/18/2009 0625   ALKPHOS 73 01/18/2009 0625   BILITOT 0.4 01/18/2009 0625   GFRNONAA 56* 09/30/2012 1422   GFRAA 65* 09/30/2012 1422     ASSESSMENT AND PLAN  79 y.o. year old female  has a past medical history of benign essential tremor currently on Xanax with good control of tremor  Will continue Alprazolam for tremor up to 3 times daily RX given to patient F/U yearly and prn  Nilda Riggs, [facility], [facility], APRN  [facility] [address] [place], [place] [address] [phone]

## 2013-06-17 NOTE — Progress Notes (Signed)
I have read the note, and I agree with the clinical assessment and plan.  WILLIS,CHARLES KEITH   

## 2013-07-01 ENCOUNTER — Encounter: Payer: Self-pay | Admitting: Internal Medicine

## 2013-07-01 ENCOUNTER — Ambulatory Visit (INDEPENDENT_AMBULATORY_CARE_PROVIDER_SITE_OTHER): Payer: Medicare Other | Admitting: Internal Medicine

## 2013-07-01 VITALS — BP 122/76 | HR 56 | Ht 61.0 in | Wt 139.2 lb

## 2013-07-01 DIAGNOSIS — J449 Chronic obstructive pulmonary disease, unspecified: Secondary | ICD-10-CM

## 2013-07-01 DIAGNOSIS — Z23 Encounter for immunization: Secondary | ICD-10-CM

## 2013-07-01 MED ORDER — TIOTROPIUM BROMIDE MONOHYDRATE 18 MCG IN CAPS: 18.0000 ug | ORAL_CAPSULE | Freq: Every day | RESPIRATORY_TRACT | Status: DC

## 2013-07-01 MED ORDER — ALBUTEROL SULFATE HFA 108 (90 BASE) MCG/ACT IN AERS: 2.0000 | INHALATION_SPRAY | Freq: Four times a day (QID) | RESPIRATORY_TRACT | Status: DC | PRN

## 2013-07-01 NOTE — Progress Notes (Signed)
Patient ID: Claudia [name], female   DOB: [birth year], 79 y.o.   MRN: [id number]  HPI 12/26/10- 77 yoF follwed for COPD/ asthma, rhinitis,  complicated by atrial flutter. Last here July 04, 2010 when we reviewed PFT with moderate obstruction, normal .  Last CXR was at hospital in Feb for atrial fib, showing CE, COPD, NAD.  Breathing was "rough" with increased shortness of breath w/o wheeze in March. Nose was running. She doesn't know if it was pollen or something about a heart med, but it got better. Throat feels full, lying in bed at night. Admits heartburn.Short of breath with steady walking and stairs. Spiriva helps.   06/28/11- 77 yoF follwed for COPD/ asthma, rhinitis,  complicated by atrial flutter. Has had flu vax. Feels comfortable, accepting unchanged dyspnea on exertion with house cleaning or brisk walks. Paces herself ok. Afib stays controlled/ Dr Clarene Duke. Accepts cost of Spiriva and finds she needs it almost every day. She wanted to take it every other day to cut costs, but can't get away with that without a decline in her breathing. Med refills discussed.   11/01/11- 77 yoF follwed for COPD/ asthma, rhinitis,  complicated by atrial flutter. 2 weeks ago went to urgent care for head congestion. Given antihistamine and steroid nasal sprays which did not help. Still feels stopped up with no headache. Now for 2 days has been coughing productive yellow. Denies fever, sneeze, itch, sore throat. Slight wheeze. She smoked 2 packs per day until 1989. Using home oxygen 2 L for sleep. Doesn't know name of her DME company.  12/27/11- 78 yoF follwed for COPD/ asthma, rhinitis,  complicated by atrial flutter. Slight wheezing, cough, congestion, and SOB at times-depends on what patient is doing Resolved bronchitis after last visit. She had another episode of acute bronchitis with her husband in early April. Now feels well. Oxygen 2 L for sleep. Rare need for rescue inhaler. Continues  Spiriva.  07/02/12- 79 yoF followed for COPD/ asthma, rhinitis,  complicated by atrial flutter. FOLLOWS FOR: breathing doing good overall unless active-walking or cleaning house; uses [employer] for QHS O2-needs to have yearly check for insurance purposes to pay for O2 2L/ sleep/ [employer]. Has had pneumonia vaccine after age 73. Had flu vaccine. She feels well today. Discussed cost of Spiriva. Dyspnea on exertion climbing stairs, cleaning house or doing laundry. CXR 12/28/11 IMPRESSION:  Emphysema right upper lobe scar. No acute finding. Stable  compared to prior exam.  Original Report Authenticated By: Bernadene Bell. D'ALESSIO, M.D.    12/31/12- 39 yoF followed for COPD/ asthma, rhinitis,  complicated by atrial flutter. FOLLOWS FOR: SOB with activity, otherwise doing well. Pt did not have ONO that was ordered at last visit 07-02-12. O2 2L/ [employer]. No change in her breathing. Seldom dry cough. Occasional rescue inhaler. Dyspnea on exertion. No chest pain or swelling. Had benign lumpectomy CXR 09/30/12 IMPRESSION:  Stable chronic lung disease. No acute cardiopulmonary abnormality.  Original Report Authenticated By: Erskine Speed, M.D.  07/01/13- 30 yoF former smoker followed for COPD/ asthma, rhinitis,  complicated by atrial flutter. FOLLOWS FOR: "Breathing about the same" denies any wheezing, cough, or congestion. "May have SOB when doing something" Update handicapped parking. She feels well controlled. No longer much wheeze and rare need for rescue inhaler. Not coughing. Continues oxygen 2 L/[employer] for sleep  Review of Systems-see HPI Constitutional:   No-   weight loss, night sweats, fevers, chills, fatigue, lassitude. HEENT:   No-  headaches, difficulty swallowing,  tooth/dental problems, sore throat,       No-  sneezing, itching, ear ache, + nasal congestion, post nasal drip,  CV:  No-   chest pain, orthopnea, PND, swelling in lower extremities, anasarca,   dizziness, palpitations Resp: +  shortness of breath with exertion or at rest.             No-productive cough,  No non-productive cough,  No- coughing up of blood.   .  Skin: No-   rash or lesions. GI:  No-   heartburn, indigestion, abdominal pain, nausea, vomiting,  GU: MS:  No-   joint pain or swelling. Neuro-     nothing unusual Psych:  No- change in mood or affect. No depression or anxiety.  No memory loss.    Objective:   Physical Exam General- Alert, Oriented, Affect-appropriate, Distress- none acute Skin- rash-none, lesions- none, excoriation- none Lymphadenopathy- none Head- atraumatic            Eyes- Gross vision intact, PERRLA, conjunctivae clear secretions            Ears- Hearing, canals-normal            Nose- Clear, +Septal dev/ external deviation to right, No- mucus, polyps, erosion, perforation             Throat- Mallampati II , mucosa clear , drainage- none, tonsils- atrophic, dentures Neck- flexible , trachea midline, no stridor , thyroid nl, carotid no bruit Chest - symmetrical excursion , unlabored           Heart/CV- RRR( no pacemaker) , no murmur , no gallop  , no rub, nl s1 s2                           - JVD- none , edema- none, stasis changes- none, varices- none           Lung- + diminished but clear to P&A, no- cough , dullness-none, rub- none           Chest wall- + kyphosis Abd-  Br/ Gen/ Rectal- Not done, not indicated Extrem- cyanosis- none, clubbing, none, atrophy- none, strength- nl Neuro- grossly intact to observation

## 2013-07-01 NOTE — Patient Instructions (Signed)
pneumococcal vaccine conjugate 13  Refill scripts for albuterol HFA rescue inhaler, Spiriva

## 2013-07-09 ENCOUNTER — Ambulatory Visit (INDEPENDENT_AMBULATORY_CARE_PROVIDER_SITE_OTHER): Payer: Medicare Other | Admitting: Pharmacist Clinician (PhC)/ Clinical Pharmacy Specialist

## 2013-07-09 VITALS — BP 130/66 | HR 60

## 2013-07-09 DIAGNOSIS — Z7901 Long term (current) use of anticoagulants: Secondary | ICD-10-CM

## 2013-07-09 DIAGNOSIS — I4892 Unspecified atrial flutter: Secondary | ICD-10-CM

## 2013-07-19 NOTE — Assessment & Plan Note (Signed)
Plan-continue oxygen for sleep. Refill her inhalers with discussion. Chest x-ray next visit. Pneumonia conjugate vaccine 13

## 2013-07-31 ENCOUNTER — Other Ambulatory Visit: Payer: Self-pay | Admitting: Pharmacist Clinician (PhC)/ Clinical Pharmacy Specialist

## 2013-07-31 MED ORDER — WARFARIN SODIUM 1 MG PO TABS: ORAL_TABLET | ORAL | Status: DC

## 2013-08-20 ENCOUNTER — Ambulatory Visit (INDEPENDENT_AMBULATORY_CARE_PROVIDER_SITE_OTHER): Payer: Medicare Other | Admitting: Pharmacist Clinician (PhC)/ Clinical Pharmacy Specialist

## 2013-08-20 VITALS — BP 162/74 | HR 76

## 2013-08-20 DIAGNOSIS — Z5181 Encounter for therapeutic drug level monitoring: Secondary | ICD-10-CM | POA: Diagnosis not present

## 2013-08-20 DIAGNOSIS — Z7901 Long term (current) use of anticoagulants: Secondary | ICD-10-CM | POA: Diagnosis not present

## 2013-08-20 DIAGNOSIS — I4892 Unspecified atrial flutter: Secondary | ICD-10-CM | POA: Diagnosis not present

## 2013-08-20 LAB — POCT INR: INR: 3.1

## 2013-09-02 ENCOUNTER — Other Ambulatory Visit: Payer: Self-pay | Admitting: *Deleted

## 2013-09-02 MED ORDER — AMLODIPINE BESYLATE 5 MG PO TABS: 5.0000 mg | ORAL_TABLET | Freq: Every day | ORAL | Status: DC

## 2013-09-02 NOTE — Telephone Encounter (Signed)
Rx was sent to pharmacy electronically. 

## 2013-09-17 ENCOUNTER — Ambulatory Visit (INDEPENDENT_AMBULATORY_CARE_PROVIDER_SITE_OTHER): Payer: Medicare Other | Admitting: Pharmacist Clinician (PhC)/ Clinical Pharmacy Specialist

## 2013-09-17 VITALS — BP 130/64 | HR 64

## 2013-09-17 DIAGNOSIS — N6089 Other benign mammary dysplasias of unspecified breast: Secondary | ICD-10-CM | POA: Diagnosis not present

## 2013-09-17 DIAGNOSIS — N6099 Unspecified benign mammary dysplasia of unspecified breast: Secondary | ICD-10-CM

## 2013-09-17 DIAGNOSIS — I4892 Unspecified atrial flutter: Secondary | ICD-10-CM

## 2013-09-17 DIAGNOSIS — Z7901 Long term (current) use of anticoagulants: Secondary | ICD-10-CM | POA: Diagnosis not present

## 2013-09-17 LAB — POCT INR: INR: 2.2

## 2013-09-18 DIAGNOSIS — Z803 Family history of malignant neoplasm of breast: Secondary | ICD-10-CM | POA: Diagnosis not present

## 2013-09-18 DIAGNOSIS — D249 Benign neoplasm of unspecified breast: Secondary | ICD-10-CM | POA: Diagnosis not present

## 2013-09-22 DIAGNOSIS — D235 Other benign neoplasm of skin of trunk: Secondary | ICD-10-CM | POA: Diagnosis not present

## 2013-09-22 DIAGNOSIS — L570 Actinic keratosis: Secondary | ICD-10-CM | POA: Diagnosis not present

## 2013-09-22 DIAGNOSIS — L219 Seborrheic dermatitis, unspecified: Secondary | ICD-10-CM | POA: Diagnosis not present

## 2013-10-15 ENCOUNTER — Ambulatory Visit (INDEPENDENT_AMBULATORY_CARE_PROVIDER_SITE_OTHER): Payer: Medicare Other | Admitting: Pharmacist Clinician (PhC)/ Clinical Pharmacy Specialist

## 2013-10-15 ENCOUNTER — Encounter (INDEPENDENT_AMBULATORY_CARE_PROVIDER_SITE_OTHER): Payer: Self-pay

## 2013-10-15 VITALS — BP 160/76 | HR 60

## 2013-10-15 DIAGNOSIS — I4892 Unspecified atrial flutter: Secondary | ICD-10-CM | POA: Diagnosis not present

## 2013-10-15 DIAGNOSIS — Z7901 Long term (current) use of anticoagulants: Secondary | ICD-10-CM | POA: Diagnosis not present

## 2013-10-15 LAB — POCT INR: INR: 2.5

## 2013-10-16 ENCOUNTER — Telehealth: Payer: Self-pay | Admitting: *Deleted

## 2013-10-16 DIAGNOSIS — N390 Urinary tract infection, site not specified: Secondary | ICD-10-CM | POA: Diagnosis not present

## 2013-10-16 DIAGNOSIS — R350 Frequency of micturition: Secondary | ICD-10-CM | POA: Diagnosis not present

## 2013-10-16 NOTE — Telephone Encounter (Signed)
Pt was calling in regards to being on Cipro and was told to call you.

## 2013-10-16 NOTE — Telephone Encounter (Signed)
Pt has another UTI, on Cipro 500mg  bid x 7 days, had INR checked yesterday 2.5.  Advised pt to continue with both meds, no changes

## 2013-11-11 DIAGNOSIS — N390 Urinary tract infection, site not specified: Secondary | ICD-10-CM | POA: Diagnosis not present

## 2013-11-11 DIAGNOSIS — N949 Unspecified condition associated with female genital organs and menstrual cycle: Secondary | ICD-10-CM | POA: Diagnosis not present

## 2013-11-11 DIAGNOSIS — R109 Unspecified abdominal pain: Secondary | ICD-10-CM | POA: Diagnosis not present

## 2013-11-11 DIAGNOSIS — R350 Frequency of micturition: Secondary | ICD-10-CM | POA: Diagnosis not present

## 2013-11-19 DIAGNOSIS — N949 Unspecified condition associated with female genital organs and menstrual cycle: Secondary | ICD-10-CM | POA: Diagnosis not present

## 2013-11-26 ENCOUNTER — Ambulatory Visit (INDEPENDENT_AMBULATORY_CARE_PROVIDER_SITE_OTHER): Payer: Medicare Other | Admitting: Pharmacist Clinician (PhC)/ Clinical Pharmacy Specialist

## 2013-11-26 VITALS — BP 164/76 | HR 56

## 2013-11-26 DIAGNOSIS — I4892 Unspecified atrial flutter: Secondary | ICD-10-CM | POA: Diagnosis not present

## 2013-11-26 DIAGNOSIS — Z7901 Long term (current) use of anticoagulants: Secondary | ICD-10-CM | POA: Diagnosis not present

## 2013-11-26 LAB — POCT INR: INR: 2.6

## 2013-12-01 DIAGNOSIS — N390 Urinary tract infection, site not specified: Secondary | ICD-10-CM | POA: Diagnosis not present

## 2013-12-01 DIAGNOSIS — R3129 Other microscopic hematuria: Secondary | ICD-10-CM | POA: Diagnosis not present

## 2013-12-16 DIAGNOSIS — R9389 Abnormal findings on diagnostic imaging of other specified body structures: Secondary | ICD-10-CM | POA: Diagnosis not present

## 2013-12-27 ENCOUNTER — Other Ambulatory Visit: Payer: Self-pay | Admitting: Pharmacist Clinician (PhC)/ Clinical Pharmacy Specialist

## 2013-12-30 ENCOUNTER — Ambulatory Visit (INDEPENDENT_AMBULATORY_CARE_PROVIDER_SITE_OTHER): Payer: Medicare Other | Admitting: Internal Medicine

## 2013-12-30 ENCOUNTER — Encounter: Payer: Self-pay | Admitting: Internal Medicine

## 2013-12-30 ENCOUNTER — Ambulatory Visit: Payer: Medicare Other | Admitting: Internal Medicine

## 2013-12-30 VITALS — BP 118/64 | HR 57 | Ht 61.0 in | Wt 136.4 lb

## 2013-12-30 DIAGNOSIS — J449 Chronic obstructive pulmonary disease, unspecified: Secondary | ICD-10-CM | POA: Diagnosis not present

## 2013-12-30 NOTE — Patient Instructions (Signed)
Please call as needed- We are continuing present meds and your oxygen at 2L for sleep.

## 2013-12-30 NOTE — Progress Notes (Signed)
Patient ID: Claudia [name], female   DOB: 12-31-1932, 80 y.o.   MRN: [id number]  HPI 12/26/10- 43 yoF follwed for COPD/ asthma, rhinitis,  complicated by atrial flutter. Last here [name] 21, 2011 when we reviewed PFT with moderate obstruction, normal 6MWT.  Last CXR was at hospital in Feb for atrial fib, showing CE, COPD, NAD.  Breathing was "rough" with increased shortness of breath w/o wheeze in March. Nose was running. She doesn't know if it was pollen or something about a heart med, but it got better. Throat feels full, lying in bed at night. Admits heartburn.Short of breath with steady walking and stairs. Spiriva helps.   06/28/11- 38 yoF follwed for COPD/ asthma, rhinitis,  complicated by atrial flutter. Has had flu vax. Feels comfortable, accepting unchanged dyspnea on exertion with house cleaning or brisk walks. Paces herself ok. Afib stays controlled/ Dr Rex Kras. Accepts cost of Spiriva and finds she needs it almost every day. She wanted to take it every other day to cut costs, but can't get away with that without a decline in her breathing. Med refills discussed.   11/01/11- 37 yoF follwed for COPD/ asthma, rhinitis,  complicated by atrial flutter. 2 weeks ago went to urgent care for head congestion. Given antihistamine and steroid nasal sprays which did not help. Still feels stopped up with no headache. Now for 2 days has been coughing productive yellow. Denies fever, sneeze, itch, sore throat. Slight wheeze. She smoked 2 packs per day until 1989. Using home oxygen 2 L for sleep. Doesn't know name of her DME company.  12/27/11- 71 yoF follwed for COPD/ asthma, rhinitis,  complicated by atrial flutter. Slight wheezing, cough, congestion, and SOB at times-depends on what patient is doing Resolved bronchitis after last visit. She had another episode of acute bronchitis with her husband in early April. Now feels well. Oxygen 2 L for sleep. Rare need for rescue inhaler. Continues  Spiriva.  07/02/12- 17 yoF followed for COPD/ asthma, rhinitis,  complicated by atrial flutter. FOLLOWS FOR: breathing doing good overall unless active-walking or cleaning house; uses [facility] for QHS O2-needs to have yearly check for insurance purposes to pay for O2 2L/ sleep/ [facility]. Has had pneumonia vaccine after age 32. Had flu vaccine. She feels well today. Discussed cost of Spiriva. Dyspnea on exertion climbing stairs, cleaning house or doing laundry. CXR 12/28/11 IMPRESSION:  Emphysema right upper lobe scar. No acute finding. Stable  compared to prior exam.  Original Report Authenticated By: Arvid Right. D'ALESSIO, M.D.    12/31/12- 43 yoF followed for COPD/ asthma, rhinitis,  complicated by atrial flutter. FOLLOWS FOR: SOB with activity, otherwise doing well. Pt did not have ONO that was ordered at last visit 07-02-12. O2 2L/ [facility]. No change in her breathing. Seldom dry cough. Occasional rescue inhaler. Dyspnea on exertion. No chest pain or swelling. Had benign lumpectomy CXR 09/30/12 IMPRESSION:  Stable chronic lung disease. No acute cardiopulmonary abnormality.  Original Report Authenticated By: Roselyn Reef, M.D.  07/01/13- 42 yoF former smoker followed for COPD/ asthma, rhinitis,  complicated by atrial flutter. FOLLOWS FOR: "Breathing about the same" denies any wheezing, cough, or congestion. "May have SOB when doing something" Update handicapped parking. She feels well controlled. No longer much wheeze and rare need for rescue inhaler. Not coughing. Continues oxygen 2 L/[facility] for sleep  12/30/13- 79 yoF former smoker followed for COPD/ asthma, rhinitis,  complicated by atrial flutter. FOLLOWS FOR: SOB or wheezing with activity; otherwise doing well "doing  great". Oxygen 2 L/sleep/[facility]. Since outdoors on patio. Easy dyspnea on exertion. Meds are okay.  Review of Systems-see HPI Constitutional:   No-   weight loss, night sweats, fevers, chills, fatigue, lassitude. HEENT:    No-  headaches, difficulty swallowing, tooth/dental problems, sore throat,       No-  sneezing, itching, ear ache, no- nasal congestion, post nasal drip,  CV:  No-   chest pain, orthopnea, PND, swelling in lower extremities, anasarca,   dizziness, palpitations Resp: + shortness of breath with exertion or at rest.             No-productive cough,  No non-productive cough,  No- coughing up of blood.   .  Skin: No-   rash or lesions. GI:  No-   heartburn, indigestion, abdominal pain, nausea, vomiting,  GU: MS:  No-   joint pain or swelling. Neuro-     nothing unusual Psych:  No- change in mood or affect. No depression or anxiety.  No memory loss.    Objective:   Physical Exam General- Alert, Oriented, Affect-appropriate, Distress- none acute Skin- rash-none, lesions- none, excoriation- none Lymphadenopathy- none Head- atraumatic            Eyes- Gross vision intact, PERRLA, conjunctivae clear secretions            Ears- Hearing, canals-normal            Nose- Clear, +Septal dev/ external deviation to right, No- mucus, polyps, erosion, perforation             Throat- Mallampati II , mucosa clear , drainage- none, tonsils- atrophic, dentures Neck- flexible , trachea midline, no stridor , thyroid nl, carotid no bruit Chest - symmetrical excursion , unlabored           Heart/CV- RRR( no pacemaker) , no murmur , no gallop  , no rub, nl s1 s2                           - JVD- none , edema- none, stasis changes- none, varices- none           Lung- + diminished but clear to P&A, no- cough , dullness-none, rub- none           Chest wall- + kyphosis Abd-  Br/ Gen/ Rectal- Not done, not indicated Extrem- cyanosis- none, clubbing, none, atrophy- none, strength- nl Neuro- grossly intact to observation

## 2014-01-11 DIAGNOSIS — N390 Urinary tract infection, site not specified: Secondary | ICD-10-CM | POA: Diagnosis not present

## 2014-01-19 ENCOUNTER — Encounter: Payer: Self-pay | Admitting: Cardiovascular Disease

## 2014-01-19 ENCOUNTER — Ambulatory Visit (INDEPENDENT_AMBULATORY_CARE_PROVIDER_SITE_OTHER): Payer: Medicare Other | Admitting: Cardiovascular Disease

## 2014-01-19 ENCOUNTER — Ambulatory Visit (INDEPENDENT_AMBULATORY_CARE_PROVIDER_SITE_OTHER): Payer: Medicare Other | Admitting: Pharmacist Clinician (PhC)/ Clinical Pharmacy Specialist

## 2014-01-19 VITALS — BP 128/68 | HR 59 | Resp 16 | Ht 62.0 in | Wt 135.3 lb

## 2014-01-19 DIAGNOSIS — I4892 Unspecified atrial flutter: Secondary | ICD-10-CM | POA: Diagnosis not present

## 2014-01-19 DIAGNOSIS — J449 Chronic obstructive pulmonary disease, unspecified: Secondary | ICD-10-CM

## 2014-01-19 DIAGNOSIS — Z7901 Long term (current) use of anticoagulants: Secondary | ICD-10-CM | POA: Diagnosis not present

## 2014-01-19 DIAGNOSIS — I4891 Unspecified atrial fibrillation: Secondary | ICD-10-CM | POA: Diagnosis not present

## 2014-01-19 DIAGNOSIS — Z79899 Other long term (current) drug therapy: Secondary | ICD-10-CM

## 2014-01-19 DIAGNOSIS — E785 Hyperlipidemia, unspecified: Secondary | ICD-10-CM

## 2014-01-19 DIAGNOSIS — I480 Paroxysmal atrial fibrillation: Secondary | ICD-10-CM

## 2014-01-19 DIAGNOSIS — I10 Essential (primary) hypertension: Secondary | ICD-10-CM

## 2014-01-19 LAB — POCT INR: INR: 3

## 2014-01-19 MED ORDER — LOSARTAN POTASSIUM 25 MG PO TABS: 25.0000 mg | ORAL_TABLET | Freq: Every day | ORAL | Status: DC

## 2014-01-19 NOTE — Patient Instructions (Signed)
Your physician recommends that you schedule a follow-up appointment in: ONE YEAR with Dr.Croitoru   Your physician recommends that you return for lab work FASTING  Change Losartan to 25mg   When you come back to see Erasmo Downer in December Dr.Croitoru wants you to have an EKG done.

## 2014-01-22 DIAGNOSIS — Z79899 Other long term (current) drug therapy: Secondary | ICD-10-CM | POA: Diagnosis not present

## 2014-01-22 LAB — COMPREHENSIVE METABOLIC PANEL
ALBUMIN: 4 g/dL (ref 3.5–5.2)
ALT: 10 U/L (ref 0–35)
AST: 17 U/L (ref 0–37)
Alkaline Phosphatase: 80 U/L (ref 39–117)
BUN: 20 mg/dL (ref 6–23)
CALCIUM: 9.2 mg/dL (ref 8.4–10.5)
CHLORIDE: 106 meq/L (ref 96–112)
CO2: 28 mEq/L (ref 19–32)
Creat: 1.1 mg/dL (ref 0.50–1.10)
GLUCOSE: 82 mg/dL (ref 70–99)
POTASSIUM: 4.4 meq/L (ref 3.5–5.3)
SODIUM: 143 meq/L (ref 135–145)
TOTAL PROTEIN: 5.9 g/dL — AB (ref 6.0–8.3)
Total Bilirubin: 0.5 mg/dL (ref 0.2–1.2)

## 2014-01-22 LAB — LIPID PANEL
Cholesterol: 202 mg/dL — ABNORMAL HIGH (ref 0–200)
HDL: 54 mg/dL (ref >39)
LDL CALC: 128 mg/dL — AB (ref 0–99)
TRIGLYCERIDES: 100 mg/dL (ref <150)
Total CHOL/HDL Ratio: 3.7 Ratio
VLDL: 20 mg/dL (ref 0–40)

## 2014-01-24 ENCOUNTER — Other Ambulatory Visit: Payer: Self-pay | Admitting: Nurse Practitioner

## 2014-01-26 ENCOUNTER — Encounter: Payer: Self-pay | Admitting: Cardiovascular Disease

## 2014-01-26 NOTE — Telephone Encounter (Signed)
Rx signed and faxed.

## 2014-01-26 NOTE — Progress Notes (Signed)
Patient ID: Claudia [name], female   DOB: 20-May-1933, 80 y.o.   MRN: [id number]     Reason for office visit Paroxysmal atrial fibrillation  Mrs. Duch has COPD and PAF, currently managed with sotalol and warfarin. Previously intolerant to dronedarone and flecainide. Has HTN, but no major structural heart disease.  At this time she has infrequent (every 3 months) and brief (under 10 minutes) episodes of palpitations, without associated symptoms or worsening dyspnea, angina or syncope. She has occasional ankle swelling and postural dizziness (this gets worse when she lies down, better standing up, likely vestibular in origin). BP is usually low. No embolic or bleeding complications.   Allergies  Allergen Reactions  . Augmentin [Amoxicillin-Pot Clavulanate] Shortness Of Breath  . Doxycycline Shortness Of Breath  . Nitrofurantoin Shortness Of Breath  . Amiodarone Hcl Nausea And Vomiting  . Clarithromycin Other (See Comments)    Crazy feeling, confusion  . Clonazepam Other (See Comments)    Doesn't remember  . Codeine Nausea And Vomiting  . Diltiazem Hcl Nausea And Vomiting  . Fish Oil Nausea And Vomiting  . Flecainide Nausea Only  . Multaq [Dronedarone] Nausea Only  . Rosuvastatin Other (See Comments)    Leg cramps   . Zetia [Ezetimibe] Other (See Comments)    Leg cramps    Current Outpatient Prescriptions  Medication Sig Dispense Refill  . acetaminophen (TYLENOL) 325 MG tablet Take 650 mg by mouth every 6 (six) hours as needed for pain.      Marland Kitchen albuterol (PROAIR HFA) 108 (90 BASE) MCG/ACT inhaler Inhale 2 puffs into the lungs every 6 (six) hours as needed for wheezing or shortness of breath.  1 Inhaler  prn  . amLODipine (NORVASC) 5 MG tablet Take 1 tablet (5 mg total) by mouth daily.  30 tablet  6  . aspirin 81 MG tablet Take 81 mg by mouth daily.        . cephALEXin (KEFLEX) 500 MG capsule Take 500 mg by mouth 3 (three) times daily.      . Polyvinyl Alcohol-Povidone (REFRESH  OP) Apply 1 drop to eye 4 (four) times daily as needed (dry eyes).      . sotalol (BETAPACE) 80 MG tablet Take 1 tablet (80 mg total) by mouth 2 (two) times daily.  60 tablet  11  . tiotropium (SPIRIVA) 18 MCG inhalation capsule Place 1 capsule (18 mcg total) into inhaler and inhale daily.  30 capsule  prn  . warfarin (COUMADIN) 1 MG tablet TAKE TWO TABLETS BY MOUTH ONCE DAILY OR AS  DIRECTED  60 tablet  5  . ALPRAZolam (XANAX) 0.25 MG tablet TAKE ONE TABLET BY MOUTH THREE TIMES DAILY AS NEEDED  90 tablet  5  . losartan (COZAAR) 25 MG tablet Take 1 tablet (25 mg total) by mouth daily.  90 tablet  3   No current facility-administered medications for this visit.    Past Medical History  Diagnosis Date  . Paroxysmal atrial flutter 01/19/2009    echo - EF 63-01%; grade 1 diastolic dysfunction; mitral valve- calcified annlus, mildly dilated R ventricle and atrium  . Abnormal chest x-ray   . COPD (chronic obstructive pulmonary disease)   . Chest pain 01/19/2009    myoview stress - LVEF >75% no reversible ischemia or infarction  . Abdominal pain, other specified site     [place] - negative abdominal ultrasound  . Hypertension   . Dyslipidemia   . PONV (postoperative nausea and vomiting)   . Pneumonia  hx    Past Surgical History  Procedure Laterality Date  . Tonsillectomy    . Cataract extraction    . Tubal ligation    . Tonsillectomy    . Eye surgery    . Breast lumpectomy with needle localization Right 10/02/2012    Procedure: BREAST LUMPECTOMY WITH NEEDLE LOCALIZATION;  Surgeon: Harl Bowie, MD;  Location: [facility];  Service: General;  Laterality: Right;    Family History  Problem Relation Age of Onset  . Bone cancer Mother   . Cancer Mother     bone  . Bone cancer Father   . Lung cancer Father   . Cancer Father     Lung  . Hyperlipidemia Father   . Hypertension Father   . Hypertension Sister   . Cancer Sister     breast  . Hyperlipidemia Sister   . Hyperlipidemia Sister    . Alzheimer's disease Sister   . Hypertension Sister   . Breast cancer Sister   . Hyperlipidemia Brother   . Hypertension Brother     History   Social History  . Marital Status: Married    Spouse Name: Shanon Brow    Number of Children: 3  . Years of Education: 12   Occupational History  . retired    Social History Main Topics  . Smoking status: Former Smoker -- 2.00 packs/day for 25 years    Types: Cigarettes    Quit date: 08/15/1987  . Smokeless tobacco: Never Used  . Alcohol Use: No  . Drug Use: No  . Sexual Activity: Not on file   Other Topics Concern  . Not on file   Social History Narrative   Patient lives with her husband Shanon Brow).   Patient has 3 children.   Patient is right-handed.   Patient drinks 1 10 oz of Coke daily.   Patient is retired.   Patient has a high school education.    Review of systems: Mild exertional dyspnea, wheezing. The patient specifically denies any chest pain at rest or with exertion, dyspnea at rest, orthopnea, paroxysmal nocturnal dyspnea, syncope,focal neurological deficits, intermittent claudication, unexplained weight gain, cough, hemoptysis.  The patient also denies abdominal pain, nausea, vomiting, dysphagia, diarrhea, constipation, polyuria, polydipsia, dysuria, hematuria, frequency, urgency, abnormal bleeding or bruising, fever, chills, unexpected weight changes, mood swings, change in skin or hair texture, change in voice quality, auditory or visual problems, allergic reactions or rashes, new musculoskeletal complaints other than usual "aches and pains".   PHYSICAL EXAM BP 128/68  Pulse 59  Resp 16  Ht $R'5\' 2"'nI$  (1.575 m)  Wt 135 lb 4.8 oz (61.372 kg)  BMI 24.74 kg/m2 General: Alert, oriented x3, no distress  Head: no evidence of trauma, PERRL, EOMI, no exophtalmos or lid lag, no myxedema, no xanthelasma; normal ears, nose and oropharynx  Neck: normal jugular venous pulsations and no hepatojugular reflux; brisk carotid pulses  without delay and no carotid bruits  Chest: Diminishedbreath sounds throughout but otherwise clear to auscultation, no signs of consolidation by percussion or palpation, normal fremitus, symmetrical and full respiratory excursions  Cardiovascular: normal position and quality of the apical impulse, regular rhythm, normal first and second heart sounds, no murmurs, rubs or gallops  Abdomen: no tenderness or distention, no masses by palpation, no abnormal pulsatility or arterial bruits, normal bowel sounds, no hepatosplenomegaly  Extremities: no clubbing, cyanosis or edema; 2+ radial, ulnar and brachial pulses bilaterally; 2+ right femoral, posterior tibial and dorsalis pedis pulses; 2+ left femoral, posterior  tibial and dorsalis pedis pulses; no subclavian or femoral bruits  Neurological: grossly nonfocal   EKG: sinus bradycardia, otherwise normal, QTc 445 ms.  Lipid Panel     Component Value Date/Time   CHOL 202* 01/22/2014 0819   TRIG 100 01/22/2014 0819   HDL 54 01/22/2014 0819   CHOLHDL 3.7 01/22/2014 0819   VLDL 20 01/22/2014 0819   LDLCALC 128* 01/22/2014 0819    BMET    Component Value Date/Time   NA 143 01/22/2014 0819   K 4.4 01/22/2014 0819   CL 106 01/22/2014 0819   CO2 28 01/22/2014 0819   GLUCOSE 82 01/22/2014 0819   BUN 20 01/22/2014 0819   CREATININE 1.10 01/22/2014 0819   CREATININE 0.95 09/30/2012 1422   CALCIUM 9.2 01/22/2014 0819   GFRNONAA 56* 09/30/2012 1422   GFRAA 65* 09/30/2012 1422     ASSESSMENT AND PLAN Minimally symptomatic PAF. Continue same therapy. Reminded about sotalol and interactions with other QT prolonging drugs. Recheck ECG every 6 months for QT interval. Check renal function periodically.  BP relatively low. Try to reduce (eventually stop?) losartan. Suspect dizziness is not vascular in etiology.  Mild dyslipidemia without known atherosclerotic complications. Focus on diet and moderate exercise.  Patient Instructions  Your physician recommends that you  schedule a follow-up appointment in: [place] with Dr.Izick Gasbarro   Your physician recommends that you return for lab work FASTING  Change Losartan to $RemoveBef'25mg'ieaqAwCgJV$   When you come back to see Erasmo Downer in December Dr.Velisa Regnier wants you to have an EKG done.       Orders Placed This Encounter  Procedures  . Comp Met (CMET)  . Lipid Profile  . EKG 12-Lead   Meds ordered this encounter  Medications  . cephALEXin (KEFLEX) 500 MG capsule    Sig: Take 500 mg by mouth 3 (three) times daily.  Marland Kitchen losartan (COZAAR) 25 MG tablet    Sig: Take 1 tablet (25 mg total) by mouth daily.    Dispense:  90 tablet    Refill:  [place] Vanilla Heatherington, MD, [facility] [phone] office [phone] pager

## 2014-01-30 ENCOUNTER — Other Ambulatory Visit: Payer: Self-pay | Admitting: Nurse Practitioner

## 2014-02-11 DIAGNOSIS — H04129 Dry eye syndrome of unspecified lacrimal gland: Secondary | ICD-10-CM | POA: Diagnosis not present

## 2014-02-14 NOTE — Assessment & Plan Note (Signed)
Controlled and stable at this point. Medications reviewed with her

## 2014-02-20 DIAGNOSIS — N390 Urinary tract infection, site not specified: Secondary | ICD-10-CM | POA: Diagnosis not present

## 2014-02-20 DIAGNOSIS — R3129 Other microscopic hematuria: Secondary | ICD-10-CM | POA: Diagnosis not present

## 2014-02-21 ENCOUNTER — Other Ambulatory Visit: Payer: Self-pay | Admitting: Cardiovascular Disease

## 2014-02-23 NOTE — Telephone Encounter (Signed)
Rx was sent to pharmacy electronically. 

## 2014-03-02 ENCOUNTER — Ambulatory Visit: Payer: Medicare Other | Admitting: Pharmacist Clinician (PhC)/ Clinical Pharmacy Specialist

## 2014-03-03 ENCOUNTER — Ambulatory Visit (INDEPENDENT_AMBULATORY_CARE_PROVIDER_SITE_OTHER): Payer: Medicare Other | Admitting: Pharmacist Clinician (PhC)/ Clinical Pharmacy Specialist

## 2014-03-03 DIAGNOSIS — I4892 Unspecified atrial flutter: Secondary | ICD-10-CM

## 2014-03-03 DIAGNOSIS — Z7901 Long term (current) use of anticoagulants: Secondary | ICD-10-CM

## 2014-03-03 LAB — POCT INR: INR: 2.9

## 2014-03-24 ENCOUNTER — Other Ambulatory Visit: Payer: Self-pay | Admitting: Cardiovascular Disease

## 2014-03-24 ENCOUNTER — Ambulatory Visit: Payer: Medicare Other | Admitting: Pharmacist Clinician (PhC)/ Clinical Pharmacy Specialist

## 2014-03-24 NOTE — Telephone Encounter (Signed)
Rx was sent to pharmacy electronically. 

## 2014-03-25 ENCOUNTER — Ambulatory Visit (INDEPENDENT_AMBULATORY_CARE_PROVIDER_SITE_OTHER): Payer: Medicare Other | Admitting: Pharmacist Clinician (PhC)/ Clinical Pharmacy Specialist

## 2014-03-25 DIAGNOSIS — Z7901 Long term (current) use of anticoagulants: Secondary | ICD-10-CM

## 2014-03-25 DIAGNOSIS — I4892 Unspecified atrial flutter: Secondary | ICD-10-CM

## 2014-03-25 LAB — POCT INR: INR: 3.2

## 2014-04-22 ENCOUNTER — Ambulatory Visit (INDEPENDENT_AMBULATORY_CARE_PROVIDER_SITE_OTHER): Payer: Medicare Other | Admitting: Pharmacist Clinician (PhC)/ Clinical Pharmacy Specialist

## 2014-04-22 DIAGNOSIS — I4892 Unspecified atrial flutter: Secondary | ICD-10-CM | POA: Diagnosis not present

## 2014-04-22 DIAGNOSIS — Z7901 Long term (current) use of anticoagulants: Secondary | ICD-10-CM | POA: Diagnosis not present

## 2014-04-22 LAB — POCT INR: INR: 3

## 2014-05-09 DIAGNOSIS — Z0000 Encounter for general adult medical examination without abnormal findings: Secondary | ICD-10-CM | POA: Diagnosis not present

## 2014-05-09 DIAGNOSIS — Z23 Encounter for immunization: Secondary | ICD-10-CM | POA: Diagnosis not present

## 2014-05-28 DIAGNOSIS — N3010 Interstitial cystitis (chronic) without hematuria: Secondary | ICD-10-CM | POA: Diagnosis not present

## 2014-06-01 ENCOUNTER — Ambulatory Visit (INDEPENDENT_AMBULATORY_CARE_PROVIDER_SITE_OTHER): Payer: Medicare Other | Admitting: Pharmacist Clinician (PhC)/ Clinical Pharmacy Specialist

## 2014-06-01 DIAGNOSIS — Z7901 Long term (current) use of anticoagulants: Secondary | ICD-10-CM

## 2014-06-01 DIAGNOSIS — I4892 Unspecified atrial flutter: Secondary | ICD-10-CM | POA: Diagnosis not present

## 2014-06-01 LAB — POCT INR: INR: 2.8

## 2014-06-03 ENCOUNTER — Ambulatory Visit: Payer: Medicare Other | Admitting: Pharmacist Clinician (PhC)/ Clinical Pharmacy Specialist

## 2014-07-02 ENCOUNTER — Ambulatory Visit (INDEPENDENT_AMBULATORY_CARE_PROVIDER_SITE_OTHER)
Admission: RE | Admit: 2014-07-02 | Discharge: 2014-07-02 | Disposition: A | Discharge status: Home / Self Care | Payer: Medicare Other | Source: Ambulatory Visit | Attending: Internal Medicine | Admitting: Internal Medicine

## 2014-07-02 ENCOUNTER — Encounter: Payer: Self-pay | Admitting: Internal Medicine

## 2014-07-02 ENCOUNTER — Ambulatory Visit (INDEPENDENT_AMBULATORY_CARE_PROVIDER_SITE_OTHER): Payer: Medicare Other | Admitting: Internal Medicine

## 2014-07-02 VITALS — BP 138/60 | HR 65

## 2014-07-02 DIAGNOSIS — J189 Pneumonia, unspecified organism: Secondary | ICD-10-CM | POA: Diagnosis not present

## 2014-07-02 DIAGNOSIS — J449 Chronic obstructive pulmonary disease, unspecified: Secondary | ICD-10-CM

## 2014-07-02 DIAGNOSIS — I10 Essential (primary) hypertension: Secondary | ICD-10-CM | POA: Diagnosis not present

## 2014-07-02 DIAGNOSIS — I480 Paroxysmal atrial fibrillation: Secondary | ICD-10-CM

## 2014-07-02 MED ORDER — TIOTROPIUM BROMIDE-OLODATEROL 2.5-2.5 MCG/ACT IN AERS: 2.0000 | INHALATION_SPRAY | Freq: Every day | RESPIRATORY_TRACT | Status: DC

## 2014-07-02 NOTE — Progress Notes (Signed)
Patient ID: Claudia [name], female   DOB: 12-31-1932, 80 y.o.   MRN: [id number]  HPI 12/26/10- 43 yoF follwed for COPD/ asthma, rhinitis,  complicated by atrial flutter. Last here [name] 21, 2011 when we reviewed PFT with moderate obstruction, normal 6MWT.  Last CXR was at hospital in Feb for atrial fib, showing CE, COPD, NAD.  Breathing was "rough" with increased shortness of breath w/o wheeze in March. Nose was running. She doesn't know if it was pollen or something about a heart med, but it got better. Throat feels full, lying in bed at night. Admits heartburn.Short of breath with steady walking and stairs. Spiriva helps.   06/28/11- 38 yoF follwed for COPD/ asthma, rhinitis,  complicated by atrial flutter. Has had flu vax. Feels comfortable, accepting unchanged dyspnea on exertion with house cleaning or brisk walks. Paces herself ok. Afib stays controlled/ Dr Rex Kras. Accepts cost of Spiriva and finds she needs it almost every day. She wanted to take it every other day to cut costs, but can't get away with that without a decline in her breathing. Med refills discussed.   11/01/11- 37 yoF follwed for COPD/ asthma, rhinitis,  complicated by atrial flutter. 2 weeks ago went to urgent care for head congestion. Given antihistamine and steroid nasal sprays which did not help. Still feels stopped up with no headache. Now for 2 days has been coughing productive yellow. Denies fever, sneeze, itch, sore throat. Slight wheeze. She smoked 2 packs per day until 1989. Using home oxygen 2 L for sleep. Doesn't know name of her DME company.  12/27/11- 71 yoF follwed for COPD/ asthma, rhinitis,  complicated by atrial flutter. Slight wheezing, cough, congestion, and SOB at times-depends on what patient is doing Resolved bronchitis after last visit. She had another episode of acute bronchitis with her husband in early April. Now feels well. Oxygen 2 L for sleep. Rare need for rescue inhaler. Continues  Spiriva.  07/02/12- 17 yoF followed for COPD/ asthma, rhinitis,  complicated by atrial flutter. FOLLOWS FOR: breathing doing good overall unless active-walking or cleaning house; uses [employer] for QHS O2-needs to have yearly check for insurance purposes to pay for O2 2L/ sleep/ [employer]. Has had pneumonia vaccine after age 32. Had flu vaccine. She feels well today. Discussed cost of Spiriva. Dyspnea on exertion climbing stairs, cleaning house or doing laundry. CXR 12/28/11 IMPRESSION:  Emphysema right upper lobe scar. No acute finding. Stable  compared to prior exam.  Original Report Authenticated By: Arvid Right. D'ALESSIO, M.D.    12/31/12- 43 yoF followed for COPD/ asthma, rhinitis,  complicated by atrial flutter. FOLLOWS FOR: SOB with activity, otherwise doing well. Pt did not have ONO that was ordered at last visit 07-02-12. O2 2L/ [employer]. No change in her breathing. Seldom dry cough. Occasional rescue inhaler. Dyspnea on exertion. No chest pain or swelling. Had benign lumpectomy CXR 09/30/12 IMPRESSION:  Stable chronic lung disease. No acute cardiopulmonary abnormality.  Original Report Authenticated By: Roselyn Reef, M.D.  07/01/13- 42 yoF former smoker followed for COPD/ asthma, rhinitis,  complicated by atrial flutter. FOLLOWS FOR: "Breathing about the same" denies any wheezing, cough, or congestion. "May have SOB when doing something" Update handicapped parking. She feels well controlled. No longer much wheeze and rare need for rescue inhaler. Not coughing. Continues oxygen 2 L/[employer] for sleep  12/30/13- 79 yoF former smoker followed for COPD/ asthma, rhinitis,  complicated by atrial flutter. FOLLOWS FOR: SOB or wheezing with activity; otherwise doing well "doing  great". Oxygen 2 L/sleep/[facility]. Since outdoors on patio. Easy dyspnea on exertion. Meds are okay.  07/02/14-  73 yoF former smoker followed for COPD/ asthma, rhinitis,  complicated by atrial flutter. FOLLOWS FOR: SOB at  times and dry cough(? allergies). Continues to wear O2 at night Can't afford Spiriva now, but it has worked very well for her. We discussed re-trial of albuterol, which had overstimulated years ago.  Review of Systems-see HPI Constitutional:   No-   weight loss, night sweats, fevers, chills, fatigue, lassitude. HEENT:   No-  headaches, difficulty swallowing, tooth/dental problems, sore throat,       No-  sneezing, itching, ear ache, no- nasal congestion, post nasal drip,  CV:  No-   chest pain, orthopnea, PND, swelling in lower extremities, anasarca,   dizziness, palpitations Resp: + shortness of breath with exertion or at rest.             No-productive cough,  No non-productive cough,  No- coughing up of blood.   .  Skin: No-   rash or lesions. GI:  No-   heartburn, indigestion, abdominal pain, nausea, vomiting,  GU: MS:  No-   joint pain or swelling. Neuro-     nothing unusual Psych:  No- change in mood or affect. No depression or anxiety.  No memory loss.    Objective:   Physical Exam General- Alert, Oriented, Affect-appropriate, Distress- none acute, petite Skin- rash-none, lesions- none, excoriation- none Lymphadenopathy- none Head- atraumatic            Eyes- Gross vision intact, PERRLA, conjunctivae clear secretions            Ears- Hearing, canals-normal            Nose- Clear, +Septal dev/ external deviation to right, No- mucus, polyps,                         erosion, perforation             Throat- Mallampati II , mucosa clear , drainage- none, tonsils- atrophic,                            dentures Neck- flexible , trachea midline, no stridor , thyroid nl, carotid no bruit Chest - symmetrical excursion , unlabored           Heart/CV- RRR( no pacemaker) , no murmur , no gallop  , no rub, nl s1 s2                           - JVD- none , edema- none, stasis changes- none, varices- none           Lung- + diminished but clear to P&A, no- cough , dullness-none, rub- none            Chest wall- + kyphosis Abd-  Br/ Gen/ Rectal- Not done, not indicated Extrem- cyanosis- none, clubbing, none, atrophy- none, strength- nl Neuro- grossly intact to observation

## 2014-07-02 NOTE — Assessment & Plan Note (Signed)
Need to ensure that we don't over-stimulate with bronchodilators, Plan- Spiriva too expensive but helpful. Will let her try Stiolto samples

## 2014-07-02 NOTE — Patient Instructions (Addendum)
Order- CXR- dx COPD mixed type  Sample and script to try Stiolto Respimat    1-2 puffs, once daily     Try this instead of Spiriva.  If you like the sample, take the script to your drug store to see what it would cost compared with Spiriva.  Please call as needed

## 2014-07-03 ENCOUNTER — Telehealth: Payer: Self-pay | Admitting: Neurology

## 2014-07-03 NOTE — Telephone Encounter (Signed)
Left message for patient regarding rescheduling 07/30/14 appointment per Carolyn's schedule, rescheduled to very next available on 08/19/14.

## 2014-07-13 ENCOUNTER — Ambulatory Visit (INDEPENDENT_AMBULATORY_CARE_PROVIDER_SITE_OTHER): Payer: Medicare Other

## 2014-07-13 ENCOUNTER — Ambulatory Visit (INDEPENDENT_AMBULATORY_CARE_PROVIDER_SITE_OTHER): Payer: Medicare Other | Admitting: Pharmacist Clinician (PhC)/ Clinical Pharmacy Specialist

## 2014-07-13 ENCOUNTER — Telehealth: Payer: Self-pay | Admitting: Internal Medicine

## 2014-07-13 VITALS — BP 130/64 | HR 55 | Ht 62.0 in | Wt 138.2 lb

## 2014-07-13 DIAGNOSIS — Z7901 Long term (current) use of anticoagulants: Secondary | ICD-10-CM | POA: Diagnosis not present

## 2014-07-13 DIAGNOSIS — I4892 Unspecified atrial flutter: Secondary | ICD-10-CM | POA: Diagnosis not present

## 2014-07-13 DIAGNOSIS — I480 Paroxysmal atrial fibrillation: Secondary | ICD-10-CM | POA: Diagnosis not present

## 2014-07-13 LAB — POCT INR: INR: 4.6

## 2014-07-13 NOTE — Telephone Encounter (Signed)
Last OV 07/02/14. Pt was given sample of stiolto at last OV and advised to give it a try and check price. She states that it is not covered by her insurance so she would like to stick with Spiriva. I advised her I will send a message to Dr. Annamaria Boots so he is aware. Bibo Bing, CMA

## 2014-07-13 NOTE — Patient Instructions (Signed)
No change in medications   Schedule appointment with Dr.Croitoru 01/2015

## 2014-07-13 NOTE — Progress Notes (Signed)
1.) Reason for visit: EKG  2.) Name of MD requesting visit: Dr.Croitoru  3.) H&P: Paroxysmal Atrial Fibrillation  4.) ROS related to problem: Patient feeling good,no complaints  5.) Assessment and plan per MD: No change in medications.Keep appointment as planned.

## 2014-07-14 NOTE — Telephone Encounter (Signed)
Pt aware of rec's per CY. Pt does not need refills at this time, will call when she gets closer to running out.  Nothing further needed.

## 2014-07-14 NOTE — Telephone Encounter (Signed)
Ok to continue her Spiriva. Refill if needed

## 2014-07-27 ENCOUNTER — Ambulatory Visit (INDEPENDENT_AMBULATORY_CARE_PROVIDER_SITE_OTHER): Payer: Medicare Other | Admitting: Pharmacist Clinician (PhC)/ Clinical Pharmacy Specialist

## 2014-07-27 DIAGNOSIS — Z7901 Long term (current) use of anticoagulants: Secondary | ICD-10-CM | POA: Diagnosis not present

## 2014-07-27 DIAGNOSIS — I4892 Unspecified atrial flutter: Secondary | ICD-10-CM

## 2014-07-27 LAB — POCT INR: INR: 3.2

## 2014-07-30 ENCOUNTER — Ambulatory Visit: Payer: Medicare Other | Admitting: Nurse Practitioner

## 2014-08-13 ENCOUNTER — Other Ambulatory Visit: Payer: Self-pay | Admitting: Internal Medicine

## 2014-08-15 ENCOUNTER — Other Ambulatory Visit: Payer: Self-pay | Admitting: Pharmacist Clinician (PhC)/ Clinical Pharmacy Specialist

## 2014-08-17 ENCOUNTER — Ambulatory Visit (INDEPENDENT_AMBULATORY_CARE_PROVIDER_SITE_OTHER): Payer: Medicare Other | Admitting: Pharmacist Clinician (PhC)/ Clinical Pharmacy Specialist

## 2014-08-17 DIAGNOSIS — Z7901 Long term (current) use of anticoagulants: Secondary | ICD-10-CM | POA: Diagnosis not present

## 2014-08-17 DIAGNOSIS — I4892 Unspecified atrial flutter: Secondary | ICD-10-CM | POA: Diagnosis not present

## 2014-08-17 LAB — POCT INR: INR: 2.6

## 2014-08-18 MED ORDER — WARFARIN SODIUM 1 MG PO TABS: ORAL_TABLET | ORAL | Status: DC

## 2014-08-18 NOTE — Addendum Note (Signed)
Addended by: Rockne Menghini on: 08/18/2014 02:11 PM   Modules accepted: Orders

## 2014-08-19 ENCOUNTER — Ambulatory Visit: Payer: Medicare Other | Admitting: Nurse Practitioner

## 2014-09-03 ENCOUNTER — Ambulatory Visit (INDEPENDENT_AMBULATORY_CARE_PROVIDER_SITE_OTHER): Payer: Medicare Other | Admitting: Nurse Practitioner

## 2014-09-03 ENCOUNTER — Encounter: Payer: Self-pay | Admitting: Nurse Practitioner

## 2014-09-03 VITALS — BP 132/68 | HR 63 | Ht 62.0 in | Wt 138.0 lb

## 2014-09-03 DIAGNOSIS — R209 Unspecified disturbances of skin sensation: Secondary | ICD-10-CM | POA: Diagnosis not present

## 2014-09-03 DIAGNOSIS — G252 Other specified forms of tremor: Principal | ICD-10-CM

## 2014-09-03 DIAGNOSIS — R251 Tremor, unspecified: Secondary | ICD-10-CM | POA: Diagnosis not present

## 2014-09-03 DIAGNOSIS — G250 Essential tremor: Secondary | ICD-10-CM

## 2014-09-03 MED ORDER — ALPRAZOLAM 0.25 MG PO TABS: 0.2500 mg | ORAL_TABLET | Freq: Three times a day (TID) | ORAL | Status: DC | PRN

## 2014-09-03 NOTE — Progress Notes (Signed)
[facility]  PATIENT: Claudia [name] DOB: 1933/05/30   REASON FOR VISIT: follow up for tremor HISTORY FROM:patient    HISTORY OF PRESENT ILLNESS:Claudia [name] is a 81 year old right-handed white female with a history of an essential tremor. The patient has done well since last seen, and she does not believe that her tremor has progressed much. The patient uses Xanax if needed, and she is satisfied with results with this medication. She continues the medication up to 3 times daily but typically she only uses 1-2 times daily. The patient has more issues with shortness of breath that limits her activities of daily living. The patient indicates that the tremors do not prevent her from doing anything. She indicates that she is tolerating the Xanax well without side effects. She uses oxygen at night for her COPD. She returns for reevaluation. She has no new neurologic complaints   REVIEW OF SYSTEMS: Full 14 system review of systems performed and notable only for those listed, all others are neg:  Constitutional: neg  Cardiovascular: neg Ear/Nose/Throat: neg  Skin: neg Eyes: neg Respiratory: Shortness of breath, O2 at night Gastroitestinal: neg  Hematology/Lymphatic: neg  Endocrine: Intolerance to cold  Musculoskeletal:neg Allergy/Immunology: neg Neurological: neg Psychiatric: neg Sleep : neg   ALLERGIES: Allergies  Allergen Reactions  . Augmentin [Amoxicillin-Pot Clavulanate] Shortness Of Breath  . Doxycycline Shortness Of Breath  . Nitrofurantoin Shortness Of Breath  . Amiodarone Hcl Nausea And Vomiting  . Clarithromycin Other (See Comments)    Crazy feeling, confusion  . Clonazepam Other (See Comments)    Doesn't remember  . Codeine Nausea And Vomiting  . Diltiazem Hcl Nausea And Vomiting  . Fish Oil Nausea And Vomiting  . Flecainide Nausea Only  . Multaq [Dronedarone] Nausea Only  . Rosuvastatin Other (See Comments)    Leg cramps   . Zetia  [Ezetimibe] Other (See Comments)    Leg cramps    HOME MEDICATIONS: Outpatient Prescriptions Prior to Visit  Medication Sig Dispense Refill  . acetaminophen (TYLENOL) 325 MG tablet Take 650 mg by mouth every 6 (six) hours as needed for pain.    Marland Kitchen ALPRAZolam (XANAX) 0.25 MG tablet TAKE ONE TABLET BY MOUTH THREE TIMES DAILY AS NEEDED 90 tablet 5  . amLODipine (NORVASC) 5 MG tablet TAKE ONE TABLET BY MOUTH ONCE DAILY 30 tablet 9  . aspirin 81 MG tablet Take 81 mg by mouth daily.      Marland Kitchen losartan (COZAAR) 25 MG tablet Take 1 tablet (25 mg total) by mouth daily. 90 tablet 3  . PROAIR HFA 108 (90 BASE) MCG/ACT inhaler INHALE TWO PUFFS BY MOUTH INTO THE LUNGS EVERY 6 HOURS AS NEEDED FOR WHEEZING OR SHORTNESS OF BREATH. 9 each 5  . sotalol (BETAPACE) 80 MG tablet TAKE ONE TABLET BY MOUTH TWICE DAILY 60 tablet 10  . SPIRIVA HANDIHALER 18 MCG inhalation capsule INHALE ONE DOSE ONCE DAILY 30 capsule 5  . trimethoprim (TRIMPEX) 100 MG tablet Take 100 mg by mouth every other day.     . warfarin (COUMADIN) 1 MG tablet Take 1-2 tablets by mouth daily as directed by coumadin clinic 135 tablet 1  . Polyvinyl Alcohol-Povidone (REFRESH OP) Apply 1 drop to eye 4 (four) times daily as needed (dry eyes).    . Tiotropium Bromide-Olodaterol (STIOLTO RESPIMAT) 2.5-2.5 MCG/ACT AERS Inhale 2 puffs into the lungs daily. (Patient not taking: Reported on 09/03/2014) 1 Inhaler prn  . Tiotropium Bromide-Olodaterol (STIOLTO RESPIMAT) 2.5-2.5 MCG/ACT AERS Inhale 2 puffs into  the lungs daily. (Patient not taking: Reported on 09/03/2014) 1 Inhaler 0   No facility-administered medications prior to visit.    PAST MEDICAL HISTORY: Past Medical History  Diagnosis Date  . Paroxysmal atrial flutter 01/19/2009    echo - EF 84-66%; grade 1 diastolic dysfunction; mitral valve- calcified annlus, mildly dilated R ventricle and atrium  . Abnormal chest x-ray   . COPD (chronic obstructive pulmonary disease)   . Chest pain 01/19/2009     myoview stress - LVEF >75% no reversible ischemia or infarction  . Abdominal pain, other specified site     [place] - negative abdominal ultrasound  . Hypertension   . Dyslipidemia   . PONV (postoperative nausea and vomiting)   . Pneumonia     hx    PAST SURGICAL HISTORY: Past Surgical History  Procedure Laterality Date  . Tonsillectomy    . Cataract extraction    . Tubal ligation    . Tonsillectomy    . Eye surgery    . Breast lumpectomy with needle localization Right 10/02/2012    Procedure: BREAST LUMPECTOMY WITH NEEDLE LOCALIZATION;  Surgeon: Harl Bowie, MD;  Location: [facility];  Service: General;  Laterality: Right;    FAMILY HISTORY: Family History  Problem Relation Age of Onset  . Bone cancer Mother   . Cancer Mother     bone  . Bone cancer Father   . Lung cancer Father   . Cancer Father     Lung  . Hyperlipidemia Father   . Hypertension Father   . Hypertension Sister   . Cancer Sister     breast  . Hyperlipidemia Sister   . Hyperlipidemia Sister   . Alzheimer's disease Sister   . Hypertension Sister   . Breast cancer Sister   . Hyperlipidemia Brother   . Hypertension Brother     SOCIAL HISTORY: History   Social History  . Marital Status: Married    Spouse Name: Claudia [name]    Number of Children: 3  . Years of Education: 12   Occupational History  . retired    Social History Main Topics  . Smoking status: Former Smoker -- 2.00 packs/day for 25 years    Types: Cigarettes    Quit date: 08/15/1987  . Smokeless tobacco: Never Used  . Alcohol Use: No  . Drug Use: No  . Sexual Activity: Not on file   Other Topics Concern  . Not on file   Social History Narrative   Patient lives with her husband Claudia [name]).   Patient has 3 children.   Patient is right-handed.   Patient drinks 1 10 oz of Coke daily.   Patient is retired.   Patient has a high school education.     PHYSICAL EXAM  Filed Vitals:   09/03/14 0856  BP: 147/67  Pulse: 63  Height: 5'  2" (1.575 m)  Weight: 138 lb (62.596 kg)   Body mass index is 25.23 kg/(m^2). Generalized: Well developed, in no acute distress  Neurological examination   Mentation: Alert oriented to time, place, history taking. Follows all commands speech and language fluent  Cranial nerve II-XII: Pupils were equal round reactive to light extraocular movements were full, visual field were full on confrontational test. Facial sensation and strength were normal. hearing was intact to finger rubbing bilaterally. Uvula tongue midline. head turning and shoulder shrug and were normal and symmetric.Tongue protrusion into cheek strength was normal. Motor: normal bulk and tone, full strength in the BUE, BLE,  fine finger movements normal, no pronator drift. No focal weakness Coordination: finger-nose-finger, heel-to-shin bilaterally, no dysmetria. Minimal intention tremor with finger to nose. Reflexes: 1+ upper lower and symmetric  Gait and Station: Rising up from seated position without assistance, normal stance, moderate stride, good arm swing, smooth turning, able to perform tiptoe, and heel walking without difficulty. Mildly unsteady with tandem. No assistive device    DIAGNOSTIC DATA (LABS, IMAGING, TESTING) - I reviewed patient records, labs, notes, testing and imaging myself where available.      Component Value Date/Time   NA 143 01/22/2014 0819   K 4.4 01/22/2014 0819   CL 106 01/22/2014 0819   CO2 28 01/22/2014 0819   GLUCOSE 82 01/22/2014 0819   BUN 20 01/22/2014 0819   CREATININE 1.10 01/22/2014 0819   CREATININE 0.95 09/30/2012 1422   CALCIUM 9.2 01/22/2014 0819   PROT 5.9* 01/22/2014 0819   ALBUMIN 4.0 01/22/2014 0819   AST 17 01/22/2014 0819   ALT 10 01/22/2014 0819   ALKPHOS 80 01/22/2014 0819   BILITOT 0.5 01/22/2014 0819   GFRNONAA 56* 09/30/2012 1422   GFRAA 65* 09/30/2012 1422   Lab Results  Component Value Date   CHOL 202* 01/22/2014   HDL 54 01/22/2014   LDLCALC 128*  01/22/2014   TRIG 100 01/22/2014   CHOLHDL 3.7 01/22/2014     ASSESSMENT AND PLAN  81 y.o. year old female  has a past medical history of Paroxysmal atrial flutter (01/19/2009);  COPD (chronic obstructive pulmonary disease); Chest pain (01/19/2009);  Hypertension; Dyslipidemia; and essential tremor here to follow-up. Her tremor is well controlled with Xanax  Continue alprazolam for tremor up to 3 times a day Prescription given to patient Follow-up yearly and when necessary Dennie Bible, [facility], [facility], APRN  [facility] [address] [place], [place] [address] [phone]

## 2014-09-03 NOTE — Patient Instructions (Signed)
Continue alprazolam for tremor up to 3 times a day Prescription given to patient Follow-up yearly and when necessary

## 2014-09-03 NOTE — Progress Notes (Signed)
I have read the note, and I agree with the clinical assessment and plan.  Zyan Mirkin KEITH   

## 2014-09-14 ENCOUNTER — Ambulatory Visit (INDEPENDENT_AMBULATORY_CARE_PROVIDER_SITE_OTHER): Payer: Medicare Other | Admitting: Pharmacist Clinician (PhC)/ Clinical Pharmacy Specialist

## 2014-09-14 DIAGNOSIS — Z7901 Long term (current) use of anticoagulants: Secondary | ICD-10-CM | POA: Diagnosis not present

## 2014-09-14 DIAGNOSIS — I4892 Unspecified atrial flutter: Secondary | ICD-10-CM | POA: Diagnosis not present

## 2014-09-14 LAB — POCT INR: INR: 2.5

## 2014-10-12 DIAGNOSIS — D225 Melanocytic nevi of trunk: Secondary | ICD-10-CM | POA: Diagnosis not present

## 2014-10-12 DIAGNOSIS — L304 Erythema intertrigo: Secondary | ICD-10-CM | POA: Diagnosis not present

## 2014-10-12 DIAGNOSIS — L219 Seborrheic dermatitis, unspecified: Secondary | ICD-10-CM | POA: Diagnosis not present

## 2014-10-23 DIAGNOSIS — Z803 Family history of malignant neoplasm of breast: Secondary | ICD-10-CM | POA: Diagnosis not present

## 2014-10-23 DIAGNOSIS — Z1231 Encounter for screening mammogram for malignant neoplasm of breast: Secondary | ICD-10-CM | POA: Diagnosis not present

## 2014-10-26 ENCOUNTER — Ambulatory Visit (INDEPENDENT_AMBULATORY_CARE_PROVIDER_SITE_OTHER): Payer: Medicare Other | Admitting: Pharmacist Clinician (PhC)/ Clinical Pharmacy Specialist

## 2014-10-26 DIAGNOSIS — Z7901 Long term (current) use of anticoagulants: Secondary | ICD-10-CM

## 2014-10-26 DIAGNOSIS — I4892 Unspecified atrial flutter: Secondary | ICD-10-CM | POA: Diagnosis not present

## 2014-10-26 LAB — POCT INR: INR: 2.6

## 2014-10-30 DIAGNOSIS — R928 Other abnormal and inconclusive findings on diagnostic imaging of breast: Secondary | ICD-10-CM | POA: Diagnosis not present

## 2014-12-07 ENCOUNTER — Ambulatory Visit (INDEPENDENT_AMBULATORY_CARE_PROVIDER_SITE_OTHER): Payer: Medicare Other | Admitting: Pharmacist Clinician (PhC)/ Clinical Pharmacy Specialist

## 2014-12-07 DIAGNOSIS — I4892 Unspecified atrial flutter: Secondary | ICD-10-CM

## 2014-12-07 DIAGNOSIS — Z7901 Long term (current) use of anticoagulants: Secondary | ICD-10-CM

## 2014-12-07 LAB — POCT INR: INR: 2.9

## 2014-12-31 ENCOUNTER — Ambulatory Visit (INDEPENDENT_AMBULATORY_CARE_PROVIDER_SITE_OTHER): Payer: Medicare Other | Admitting: Internal Medicine

## 2014-12-31 ENCOUNTER — Encounter: Payer: Self-pay | Admitting: Internal Medicine

## 2014-12-31 VITALS — BP 136/64 | HR 61 | Ht 62.0 in | Wt 140.0 lb

## 2014-12-31 DIAGNOSIS — J449 Chronic obstructive pulmonary disease, unspecified: Secondary | ICD-10-CM

## 2014-12-31 DIAGNOSIS — I4892 Unspecified atrial flutter: Secondary | ICD-10-CM

## 2014-12-31 MED ORDER — ALBUTEROL SULFATE HFA 108 (90 BASE) MCG/ACT IN AERS: INHALATION_SPRAY | RESPIRATORY_TRACT | Status: DC

## 2014-12-31 MED ORDER — TIOTROPIUM BROMIDE MONOHYDRATE 18 MCG IN CAPS: ORAL_CAPSULE | RESPIRATORY_TRACT | Status: DC

## 2014-12-31 NOTE — Progress Notes (Signed)
Patient ID: Claudia [name], female   DOB: 12-31-1932, 81 y.o.   MRN: [id number]  HPI 12/26/10- 43 yoF follwed for COPD/ asthma, rhinitis,  complicated by atrial flutter. Last here [name] 21, 2011 when we reviewed PFT with moderate obstruction, normal 6MWT.  Last CXR was at hospital in Feb for atrial fib, showing CE, COPD, NAD.  Breathing was "rough" with increased shortness of breath w/o wheeze in March. Nose was running. She doesn't know if it was pollen or something about a heart med, but it got better. Throat feels full, lying in bed at night. Admits heartburn.Short of breath with steady walking and stairs. Spiriva helps.   06/28/11- 38 yoF follwed for COPD/ asthma, rhinitis,  complicated by atrial flutter. Has had flu vax. Feels comfortable, accepting unchanged dyspnea on exertion with house cleaning or brisk walks. Paces herself ok. Afib stays controlled/ Dr Rex Kras. Accepts cost of Spiriva and finds she needs it almost every day. She wanted to take it every other day to cut costs, but can't get away with that without a decline in her breathing. Med refills discussed.   11/01/11- 37 yoF follwed for COPD/ asthma, rhinitis,  complicated by atrial flutter. 2 weeks ago went to urgent care for head congestion. Given antihistamine and steroid nasal sprays which did not help. Still feels stopped up with no headache. Now for 2 days has been coughing productive yellow. Denies fever, sneeze, itch, sore throat. Slight wheeze. She smoked 2 packs per day until 1989. Using home oxygen 2 L for sleep. Doesn't know name of her DME company.  12/27/11- 71 yoF follwed for COPD/ asthma, rhinitis,  complicated by atrial flutter. Slight wheezing, cough, congestion, and SOB at times-depends on what patient is doing Resolved bronchitis after last visit. She had another episode of acute bronchitis with her husband in early April. Now feels well. Oxygen 2 L for sleep. Rare need for rescue inhaler. Continues  Spiriva.  07/02/12- 17 yoF followed for COPD/ asthma, rhinitis,  complicated by atrial flutter. FOLLOWS FOR: breathing doing good overall unless active-walking or cleaning house; uses [employer] for QHS O2-needs to have yearly check for insurance purposes to pay for O2 2L/ sleep/ [employer]. Has had pneumonia vaccine after age 32. Had flu vaccine. She feels well today. Discussed cost of Spiriva. Dyspnea on exertion climbing stairs, cleaning house or doing laundry. CXR 12/28/11 IMPRESSION:  Emphysema right upper lobe scar. No acute finding. Stable  compared to prior exam.  Original Report Authenticated By: Arvid Right. D'ALESSIO, M.D.    12/31/12- 43 yoF followed for COPD/ asthma, rhinitis,  complicated by atrial flutter. FOLLOWS FOR: SOB with activity, otherwise doing well. Pt did not have ONO that was ordered at last visit 07-02-12. O2 2L/ [employer]. No change in her breathing. Seldom dry cough. Occasional rescue inhaler. Dyspnea on exertion. No chest pain or swelling. Had benign lumpectomy CXR 09/30/12 IMPRESSION:  Stable chronic lung disease. No acute cardiopulmonary abnormality.  Original Report Authenticated By: Roselyn Reef, M.D.  07/01/13- 42 yoF former smoker followed for COPD/ asthma, rhinitis,  complicated by atrial flutter. FOLLOWS FOR: "Breathing about the same" denies any wheezing, cough, or congestion. "May have SOB when doing something" Update handicapped parking. She feels well controlled. No longer much wheeze and rare need for rescue inhaler. Not coughing. Continues oxygen 2 L/[employer] for sleep  12/30/13- 79 yoF former smoker followed for COPD/ asthma, rhinitis,  complicated by atrial flutter. FOLLOWS FOR: SOB or wheezing with activity; otherwise doing well "doing  great". Oxygen 2 L/sleep/[employer]. Since outdoors on patio. Easy dyspnea on exertion. Meds are okay.  07/02/14-  47 yoF former smoker followed for COPD/ asthma, rhinitis,  complicated by atrial flutter. FOLLOWS FOR: SOB at  times and dry cough(? allergies). Continues to wear O2 at night Can't afford Spiriva now, but it has worked very well for her. We discussed re-trial of albuterol, which had overstimulated years ago.  12/31/14-   59 yoF former smoker followed for COPD/ asthma, rhinitis,  complicated by atrial flutter. Follows for:pt. is getting better, wears O2 2L/ [employer]  at night every night denies acute illness recently. Noticing dependent edema and feet for the past 2 weeks. Occasionally feels her palpitations. CXR 07/02/14 IMPRESSION: No active cardiopulmonary disease. Electronically Signed  By: Kathreen Devoid  On: 07/02/2014 13:59  Review of Systems-see HPI Constitutional:   No-   weight loss, night sweats, fevers, chills, fatigue, lassitude. HEENT:   No-  headaches, difficulty swallowing, tooth/dental problems, sore throat,       No-  sneezing, itching, ear ache, no- nasal congestion, post nasal drip,  CV:  No-   chest pain, orthopnea, PND, swelling in lower extremities, anasarca,   dizziness, +palpitations Resp: + shortness of breath with exertion or at rest.             No-productive cough,  No non-productive cough,  No- coughing up of blood.   .  Skin: No-   rash or lesions. GI:  No-   heartburn, indigestion, abdominal pain, nausea, vomiting,  GU: MS:  No-   joint pain or swelling. Neuro-     nothing unusual Psych:  No- change in mood or affect. No depression or anxiety.  No memory loss.    Objective:   Physical Exam General- Alert, Oriented, Affect-appropriate, Distress- none acute, petite Skin- rash-none, lesions- none, excoriation- none Lymphadenopathy- none Head- atraumatic            Eyes- Gross vision intact, PERRLA, conjunctivae clear secretions            Ears- Hearing, canals-normal            Nose- Clear, +Septal dev/ external deviation to right, No- mucus, polyps,                         erosion, perforation             Throat- Mallampati II , mucosa clear , drainage- none,  tonsils- atrophic,                            dentures Neck- flexible , trachea midline, no stridor , thyroid nl, carotid no bruit Chest - symmetrical excursion , unlabored           Heart/CV- RRR( no pacemaker) , no murmur , no gallop  , no rub, nl s1 s2                           - JVD- none , edema +1, stasis changes- none, varices- none           Lung- + diminished but clear to P&A, no- cough , dullness-none, rub- none           Chest wall- + kyphosis Abd-  Br/ Gen/ Rectal- Not done, not indicated Extrem- cyanosis- none, clubbing, none, atrophy- none, strength- nl Neuro- grossly intact to observation

## 2014-12-31 NOTE — Patient Instructions (Signed)
Scripts printed to refill Proair and Spriva  Please call if needed

## 2015-01-09 ENCOUNTER — Other Ambulatory Visit: Payer: Self-pay | Admitting: Cardiovascular Disease

## 2015-01-12 NOTE — Telephone Encounter (Signed)
Rx(s) sent to pharmacy electronically. OV 6/13

## 2015-01-16 ENCOUNTER — Other Ambulatory Visit: Payer: Self-pay | Admitting: Cardiovascular Disease

## 2015-01-18 NOTE — Assessment & Plan Note (Signed)
She notices occasional palpitations but nothing sustained and not associated with dizziness or chest pain.

## 2015-01-18 NOTE — Telephone Encounter (Signed)
Rx(s) sent to pharmacy electronically.  

## 2015-01-18 NOTE — Assessment & Plan Note (Addendum)
Continues to require oxygen 2 L for sleep but adequate when awake on room air. Discussed impact of stimulant bronchodilator medications on heart rhythm. Plan-medications refilled.

## 2015-01-25 ENCOUNTER — Encounter: Payer: Self-pay | Admitting: Cardiovascular Disease

## 2015-01-25 ENCOUNTER — Ambulatory Visit (INDEPENDENT_AMBULATORY_CARE_PROVIDER_SITE_OTHER): Payer: Medicare Other | Admitting: Pharmacist Clinician (PhC)/ Clinical Pharmacy Specialist

## 2015-01-25 ENCOUNTER — Ambulatory Visit (INDEPENDENT_AMBULATORY_CARE_PROVIDER_SITE_OTHER): Payer: Medicare Other | Admitting: Cardiovascular Disease

## 2015-01-25 ENCOUNTER — Telehealth: Payer: Self-pay | Admitting: Neurology

## 2015-01-25 VITALS — BP 158/76 | HR 64 | Ht 62.0 in | Wt 138.9 lb

## 2015-01-25 DIAGNOSIS — I10 Essential (primary) hypertension: Secondary | ICD-10-CM

## 2015-01-25 DIAGNOSIS — I4892 Unspecified atrial flutter: Secondary | ICD-10-CM

## 2015-01-25 DIAGNOSIS — Z79899 Other long term (current) drug therapy: Secondary | ICD-10-CM

## 2015-01-25 DIAGNOSIS — Z7901 Long term (current) use of anticoagulants: Secondary | ICD-10-CM

## 2015-01-25 DIAGNOSIS — I480 Paroxysmal atrial fibrillation: Secondary | ICD-10-CM

## 2015-01-25 LAB — POCT INR: INR: 3.2

## 2015-01-25 MED ORDER — AMLODIPINE BESYLATE 2.5 MG PO TABS: 2.5000 mg | ORAL_TABLET | Freq: Every day | ORAL | Status: DC

## 2015-01-25 NOTE — Patient Instructions (Signed)
Medication Instructions:   Decrease Amlodipine to 2.5mg  daily.  Labwork:  Go to [facility] at [facility].  Testing/Procedures:  None  Follow-Up:  One Year.  Any Other Special Instructions Will Be Listed Below (If Applicable).

## 2015-01-25 NOTE — Telephone Encounter (Signed)
Claudia [name] with [employer] called regarding ALPRAZolam Claudia [name]) 0.25 MG tablet , coverage has been approved for 1 year as of January 24, 2015 and will expire January 24, 2016. Patient has been notified. She can be reached at [phone]. She will mail authorization also.

## 2015-01-25 NOTE — Telephone Encounter (Signed)
[employer] approved the request for coverage on Alprazolam effective until 01/24/2016 Ref # [id number]

## 2015-01-25 NOTE — Progress Notes (Signed)
Patient ID: Daryel November, female   DOB: [birth year], 81 y.o.   MRN: [id number]     Cardiology Office Note   Date:  01/26/2015   ID:  Claudia [name], DOB 12/15/1932, MRN [id number]  PCP:  Thurman Coyer, MD  Cardiologist:   Sanda Klein, MD   Chief Complaint  Patient presents with  . Annual Exam    No complaints of chest pain or dizziness.  SOB due to COPD.  Occas. ankle edema.      History of Present Illness: Claudia [name] is a 81 y.o. female who presents for follow-up for paroxysmal atrial fibrillation. He has long-standing COPD, which may be the substrate for paroxysmal atrial fibrillation which has responded well to treatment with sotalol and warfarin. She was intolerant to flecainide and dronedarone in the past. She believes she has had only 2 episodes of atrial fibrillation in the last 12 months. Each one lasted for less than 30 minutes and was not particularly symptomatic. Her presenting rhythm today is sinus bradycardia and her corrected QT interval is 449 ms. She has been keeping a very detailed log of her blood pressure and heart rate at home. Her typical blood pressure is in the 105-115/53-63 range. Her heart rate is usually in the 50s or 60s. Times her blood pressure drops in the high 90s and she feels weak.  Has chronic stable dyspnea and denies chest pain. She has not experienced syncope. Ankle edema occurs frequently, not usually severe. Has not had any bleeding complications on warfarin, nor has she had any focal  Are logical episodes to suggest embolic stroke/TIA. CHADSVasc score is at least 3.    Past Medical History  Diagnosis Date  . Paroxysmal atrial flutter 01/19/2009    echo - EF 77-93%; grade 1 diastolic dysfunction; mitral valve- calcified annlus, mildly dilated R ventricle and atrium  . Abnormal chest x-ray   . COPD (chronic obstructive pulmonary disease)   . Chest pain 01/19/2009    myoview stress - LVEF >75% no reversible ischemia or infarction  . Abdominal  pain, other specified site     [place] - negative abdominal ultrasound  . Hypertension   . Dyslipidemia   . PONV (postoperative nausea and vomiting)   . Pneumonia     hx    Past Surgical History  Procedure Laterality Date  . Tonsillectomy    . Cataract extraction    . Tubal ligation    . Tonsillectomy    . Eye surgery    . Breast lumpectomy with needle localization Right 10/02/2012    Procedure: BREAST LUMPECTOMY WITH NEEDLE LOCALIZATION;  Surgeon: Harl Bowie, MD;  Location: [place];  Service: General;  Laterality: Right;     Current Outpatient Prescriptions  Medication Sig Dispense Refill  . acetaminophen (TYLENOL) 325 MG tablet Take 650 mg by mouth every 6 (six) hours as needed for pain.    Marland Kitchen albuterol (PROAIR HFA) 108 (90 BASE) MCG/ACT inhaler 1-2 puffs every 6 hours as directed 1 each 11  . ALPRAZolam (XANAX) 0.25 MG tablet Take 1 tablet (0.25 mg total) by mouth 3 (three) times daily as needed. 90 tablet 5  . amLODipine (NORVASC) 2.5 MG tablet Take 1 tablet (2.5 mg total) by mouth daily. 30 tablet 11  . aspirin 81 MG tablet Take 81 mg by mouth daily.      . carboxymethylcellulose (REFRESH PLUS) 0.5 % SOLN 1 drop as needed.    Marland Kitchen losartan (COZAAR) 25 MG tablet TAKE 1 TABLET  BY MOUTH EVERY DAY 90 tablet 0  . OXYGEN Inhale 2 L into the lungs. Use @@ Bedtime    . Polyvinyl Alcohol-Povidone (REFRESH OP) Apply 1 drop to eye 4 (four) times daily as needed (dry eyes).    . sotalol (BETAPACE) 80 MG tablet TAKE 1 TABLET BY MOUTH TWICE DAILY 60 tablet 6  . tiotropium (SPIRIVA HANDIHALER) 18 MCG inhalation capsule 1 inhale daily 30 capsule 11  . trimethoprim (TRIMPEX) 100 MG tablet Take 100 mg by mouth every other day.     . warfarin (COUMADIN) 1 MG tablet Take 1-2 tablets by mouth daily as directed by coumadin clinic 135 tablet 1   No current facility-administered medications for this visit.    Allergies:   Augmentin; Doxycycline; Nitrofurantoin; Amiodarone hcl; Clarithromycin;  Clonazepam; Codeine; Diltiazem hcl; Fish oil; Flecainide; Multaq; Rosuvastatin; and Zetia    Social History:  The patient  reports that she quit smoking about 27 years ago. Her smoking use included Cigarettes. She has a 50 pack-year smoking history. She has never used smokeless tobacco. She reports that she does not drink alcohol or use illicit drugs.   Family History:  The patient's family history includes Alzheimer's disease in her sister; Bone cancer in her father and mother; Breast cancer in her sister; Cancer in her father, mother, and sister; Hyperlipidemia in her brother, father, sister, and sister; Hypertension in her brother, father, sister, and sister; Lung cancer in her father.    ROS:  Please see the history of present illness.    Otherwise, review of systems positive for none.   All other systems are reviewed and negative.    PHYSICAL EXAM: VS:  BP 158/76 mmHg  Pulse 64  Ht 5\' 2"  (1.575 m)  Wt 138 lb 14.4 oz (63.005 kg)  BMI 25.40 kg/m2 , BMI Body mass index is 25.4 kg/(m^2).  General: Alert, oriented x3, no distress Head: no evidence of trauma, PERRL, EOMI, no exophtalmos or lid lag, no myxedema, no xanthelasma; normal ears, nose and oropharynx Neck: normal jugular venous pulsations and no hepatojugular reflux; brisk carotid pulses without delay and no carotid bruits Chest: very distant breath sounds bilaterally but without wheezing or rhonchi, no signs of consolidation by percussion or palpation, normal fremitus, symmetrical and full respiratory excursions Cardiovascular: normal position and quality of the apical impulse, regular rhythm, normal first and second heart sounds, no murmurs, rubs or gallops Abdomen: no tenderness or distention, no masses by palpation, no abnormal pulsatility or arterial bruits, normal bowel sounds, no hepatosplenomegaly Extremities: no clubbing, cyanosis or edema; 2+ radial, ulnar and brachial pulses bilaterally; 2+ right femoral, posterior tibial  and dorsalis pedis pulses; 2+ left femoral, posterior tibial and dorsalis pedis pulses; no subclavian or femoral bruits Neurological: grossly nonfocal Psych: euthymic mood, full affect   EKG:  EKG is ordered today. The ekg ordered today demonstrates sinus bradycardia 58 bpm, QTC 449 ms   Recent Labs: No results found for requested labs within last 365 days.    Lipid Panel    Component Value Date/Time   CHOL 202* 01/22/2014 0819   TRIG 100 01/22/2014 0819   HDL 54 01/22/2014 0819   CHOLHDL 3.7 01/22/2014 0819   VLDL 20 01/22/2014 0819   LDLCALC 128* 01/22/2014 0819      Wt Readings from Last 3 Encounters:  01/25/15 138 lb 14.4 oz (63.005 kg)  12/31/14 140 lb (63.504 kg)  09/03/14 138 lb (62.596 kg)      ASSESSMENT AND PLAN:  1.  Paroxysmal atrial fibrillation with a low burden and minimal symptoms. She has had a good response to sotalol therapy. While this inferior could cause worsening of reactive airway disease, there are not many other options available since she was intolerant to flecainide and Multaq. Amiodarone would be a consideration, but switching does not appear to be necessary at this time. QT interval is in the desirable range. Reminded her about the risk of drug interactions with QT interval prolongation. Should remain on lifelong warfarin anticoagulation barring serious bleeding complications. Check renal and hepatic function panels twice yearly.  2. HTN - Her blood pressure is low and she has occasional significant ankle swelling. Will decrease her amlodipine to 2.5 mg daily  Current medicines are reviewed at length with the patient today.  The patient does not have concerns regarding medicines.  The following changes have been made:  Decrease amlodipine to 2.5 mg daily  Labs/ tests ordered today include:  Orders Placed This Encounter  Procedures  . CBC  . Comprehensive metabolic panel  . EKG 12-Lead   Patient Instructions  Medication Instructions:    Decrease Amlodipine to 2.5mg  daily.  Labwork:  Go to [facility] at [facility].  Testing/Procedures:  None  Follow-Up:  One Year.  Any Other Special Instructions Will Be Listed Below (If Applicable).     Mikael Spray, MD  01/26/2015 1:46 PM    Sanda Klein, MD, [facility] [phone] office [phone] pager

## 2015-01-26 LAB — COMPREHENSIVE METABOLIC PANEL
ALBUMIN: 4.3 g/dL (ref 3.5–5.2)
ALT: 8 U/L (ref 0–35)
AST: 17 U/L (ref 0–37)
Alkaline Phosphatase: 136 U/L — ABNORMAL HIGH (ref 39–117)
BUN: 14 mg/dL (ref 6–23)
CO2: 30 mEq/L (ref 19–32)
Calcium: 9.6 mg/dL (ref 8.4–10.5)
Chloride: 105 mEq/L (ref 96–112)
Creat: 1.13 mg/dL — ABNORMAL HIGH (ref 0.50–1.10)
Glucose, Bld: 91 mg/dL (ref 70–99)
POTASSIUM: 4.8 meq/L (ref 3.5–5.3)
SODIUM: 141 meq/L (ref 135–145)
Total Bilirubin: 0.4 mg/dL (ref 0.2–1.2)
Total Protein: 6 g/dL (ref 6.0–8.3)

## 2015-01-26 LAB — CBC
HCT: 40.5 % (ref 36.0–46.0)
HEMOGLOBIN: 13 g/dL (ref 12.0–15.0)
MCH: 27.8 pg (ref 26.0–34.0)
MCHC: 32.1 g/dL (ref 30.0–36.0)
MCV: 86.5 fL (ref 78.0–100.0)
MPV: 10.1 fL (ref 8.6–12.4)
Platelets: 238 10*3/uL (ref 150–400)
RBC: 4.68 MIL/uL (ref 3.87–5.11)
RDW: 14.8 % (ref 11.5–15.5)
WBC: 8.1 10*3/uL (ref 4.0–10.5)

## 2015-01-28 DIAGNOSIS — M5136 Other intervertebral disc degeneration, lumbar region: Secondary | ICD-10-CM | POA: Diagnosis not present

## 2015-01-28 DIAGNOSIS — M545 Low back pain: Secondary | ICD-10-CM | POA: Diagnosis not present

## 2015-02-17 ENCOUNTER — Ambulatory Visit (INDEPENDENT_AMBULATORY_CARE_PROVIDER_SITE_OTHER): Payer: Medicare Other | Admitting: Pharmacist Clinician (PhC)/ Clinical Pharmacy Specialist

## 2015-02-17 DIAGNOSIS — Z7901 Long term (current) use of anticoagulants: Secondary | ICD-10-CM | POA: Diagnosis not present

## 2015-02-17 DIAGNOSIS — I4892 Unspecified atrial flutter: Secondary | ICD-10-CM

## 2015-02-17 LAB — POCT INR: INR: 3.1

## 2015-03-01 ENCOUNTER — Ambulatory Visit (INDEPENDENT_AMBULATORY_CARE_PROVIDER_SITE_OTHER): Payer: Medicare Other | Admitting: Pharmacist Clinician (PhC)/ Clinical Pharmacy Specialist

## 2015-03-01 DIAGNOSIS — I4892 Unspecified atrial flutter: Secondary | ICD-10-CM

## 2015-03-01 DIAGNOSIS — Z7901 Long term (current) use of anticoagulants: Secondary | ICD-10-CM

## 2015-03-01 LAB — POCT INR: INR: 2.4

## 2015-03-12 ENCOUNTER — Other Ambulatory Visit: Payer: Self-pay | Admitting: Nurse Practitioner

## 2015-03-15 ENCOUNTER — Other Ambulatory Visit: Payer: Self-pay

## 2015-03-15 MED ORDER — ALPRAZOLAM 0.25 MG PO TABS: 0.2500 mg | ORAL_TABLET | Freq: Three times a day (TID) | ORAL | Status: DC | PRN

## 2015-03-15 NOTE — Telephone Encounter (Signed)
Rx signed and faxed.

## 2015-03-20 ENCOUNTER — Other Ambulatory Visit: Payer: Self-pay | Admitting: Cardiovascular Disease

## 2015-03-29 ENCOUNTER — Ambulatory Visit (INDEPENDENT_AMBULATORY_CARE_PROVIDER_SITE_OTHER): Payer: Medicare Other | Admitting: Pharmacist Clinician (PhC)/ Clinical Pharmacy Specialist

## 2015-03-29 DIAGNOSIS — Z7901 Long term (current) use of anticoagulants: Secondary | ICD-10-CM | POA: Diagnosis not present

## 2015-03-29 DIAGNOSIS — I4892 Unspecified atrial flutter: Secondary | ICD-10-CM

## 2015-03-29 LAB — POCT INR: INR: 2.1

## 2015-04-10 ENCOUNTER — Other Ambulatory Visit: Payer: Self-pay | Admitting: Cardiovascular Disease

## 2015-04-12 NOTE — Telephone Encounter (Signed)
REFILL 

## 2015-04-13 DIAGNOSIS — H04123 Dry eye syndrome of bilateral lacrimal glands: Secondary | ICD-10-CM | POA: Diagnosis not present

## 2015-05-05 DIAGNOSIS — Z23 Encounter for immunization: Secondary | ICD-10-CM | POA: Diagnosis not present

## 2015-05-10 ENCOUNTER — Ambulatory Visit (INDEPENDENT_AMBULATORY_CARE_PROVIDER_SITE_OTHER): Payer: Medicare Other | Admitting: Pharmacist Clinician (PhC)/ Clinical Pharmacy Specialist

## 2015-05-10 DIAGNOSIS — I4892 Unspecified atrial flutter: Secondary | ICD-10-CM | POA: Diagnosis not present

## 2015-05-10 DIAGNOSIS — Z7901 Long term (current) use of anticoagulants: Secondary | ICD-10-CM | POA: Diagnosis not present

## 2015-05-10 LAB — POCT INR: INR: 2.2

## 2015-06-02 DIAGNOSIS — Z87440 Personal history of urinary (tract) infections: Secondary | ICD-10-CM | POA: Diagnosis not present

## 2015-06-21 ENCOUNTER — Ambulatory Visit (INDEPENDENT_AMBULATORY_CARE_PROVIDER_SITE_OTHER): Payer: Medicare Other | Admitting: Pharmacist Clinician (PhC)/ Clinical Pharmacy Specialist

## 2015-06-21 DIAGNOSIS — I4892 Unspecified atrial flutter: Secondary | ICD-10-CM

## 2015-06-21 DIAGNOSIS — Z7901 Long term (current) use of anticoagulants: Secondary | ICD-10-CM | POA: Diagnosis not present

## 2015-06-21 LAB — POCT INR: INR: 1.7

## 2015-07-05 ENCOUNTER — Ambulatory Visit (INDEPENDENT_AMBULATORY_CARE_PROVIDER_SITE_OTHER): Payer: Medicare Other | Admitting: Pharmacist Clinician (PhC)/ Clinical Pharmacy Specialist

## 2015-07-05 DIAGNOSIS — Z7901 Long term (current) use of anticoagulants: Secondary | ICD-10-CM | POA: Diagnosis not present

## 2015-07-05 DIAGNOSIS — I4892 Unspecified atrial flutter: Secondary | ICD-10-CM | POA: Diagnosis not present

## 2015-07-05 LAB — POCT INR: INR: 1.8

## 2015-07-06 ENCOUNTER — Ambulatory Visit (INDEPENDENT_AMBULATORY_CARE_PROVIDER_SITE_OTHER): Payer: Medicare Other | Admitting: Internal Medicine

## 2015-07-06 ENCOUNTER — Encounter: Payer: Self-pay | Admitting: Internal Medicine

## 2015-07-06 VITALS — BP 142/78 | HR 62 | Ht 62.0 in | Wt 138.0 lb

## 2015-07-06 DIAGNOSIS — J45909 Unspecified asthma, uncomplicated: Secondary | ICD-10-CM

## 2015-07-06 DIAGNOSIS — I480 Paroxysmal atrial fibrillation: Secondary | ICD-10-CM | POA: Diagnosis not present

## 2015-07-06 DIAGNOSIS — J449 Chronic obstructive pulmonary disease, unspecified: Secondary | ICD-10-CM | POA: Diagnosis not present

## 2015-07-06 DIAGNOSIS — J9611 Chronic respiratory failure with hypoxia: Secondary | ICD-10-CM | POA: Diagnosis not present

## 2015-07-06 DIAGNOSIS — J9620 Acute and chronic respiratory failure, unspecified whether with hypoxia or hypercapnia: Secondary | ICD-10-CM | POA: Insufficient documentation

## 2015-07-06 DIAGNOSIS — J4489 Other specified chronic obstructive pulmonary disease: Secondary | ICD-10-CM

## 2015-07-06 NOTE — Assessment & Plan Note (Signed)
No acute concerns at this time. Medications discussed and refilled.

## 2015-07-06 NOTE — Assessment & Plan Note (Signed)
Appropriate to continue oxygen for sleep 

## 2015-07-06 NOTE — Progress Notes (Signed)
Patient ID: Claudia [name], female   DOB: 12-31-1932, 81 y.o.   MRN: [id number]  HPI 12/26/10- 43 yoF follwed for COPD/ asthma, rhinitis,  complicated by atrial flutter. Last here [name] 21, 2011 when we reviewed PFT with moderate obstruction, normal 6MWT.  Last CXR was at hospital in Feb for atrial fib, showing CE, COPD, NAD.  Breathing was "rough" with increased shortness of breath w/o wheeze in March. Nose was running. She doesn't know if it was pollen or something about a heart med, but it got better. Throat feels full, lying in bed at night. Admits heartburn.Short of breath with steady walking and stairs. Spiriva helps.   06/28/11- 38 yoF follwed for COPD/ asthma, rhinitis,  complicated by atrial flutter. Has had flu vax. Feels comfortable, accepting unchanged dyspnea on exertion with house cleaning or brisk walks. Paces herself ok. Afib stays controlled/ Dr Rex Kras. Accepts cost of Spiriva and finds she needs it almost every day. She wanted to take it every other day to cut costs, but can't get away with that without a decline in her breathing. Med refills discussed.   11/01/11- 37 yoF follwed for COPD/ asthma, rhinitis,  complicated by atrial flutter. 2 weeks ago went to urgent care for head congestion. Given antihistamine and steroid nasal sprays which did not help. Still feels stopped up with no headache. Now for 2 days has been coughing productive yellow. Denies fever, sneeze, itch, sore throat. Slight wheeze. She smoked 2 packs per day until 1989. Using home oxygen 2 L for sleep. Doesn't know name of her DME company.  12/27/11- 71 yoF follwed for COPD/ asthma, rhinitis,  complicated by atrial flutter. Slight wheezing, cough, congestion, and SOB at times-depends on what patient is doing Resolved bronchitis after last visit. She had another episode of acute bronchitis with her husband in early April. Now feels well. Oxygen 2 L for sleep. Rare need for rescue inhaler. Continues  Spiriva.  07/02/12- 17 yoF followed for COPD/ asthma, rhinitis,  complicated by atrial flutter. FOLLOWS FOR: breathing doing good overall unless active-walking or cleaning house; uses [employer] for QHS O2-needs to have yearly check for insurance purposes to pay for O2 2L/ sleep/ [employer]. Has had pneumonia vaccine after age 32. Had flu vaccine. She feels well today. Discussed cost of Spiriva. Dyspnea on exertion climbing stairs, cleaning house or doing laundry. CXR 12/28/11 IMPRESSION:  Emphysema right upper lobe scar. No acute finding. Stable  compared to prior exam.  Original Report Authenticated By: Arvid Right. D'ALESSIO, M.D.    12/31/12- 43 yoF followed for COPD/ asthma, rhinitis,  complicated by atrial flutter. FOLLOWS FOR: SOB with activity, otherwise doing well. Pt did not have ONO that was ordered at last visit 07-02-12. O2 2L/ [employer]. No change in her breathing. Seldom dry cough. Occasional rescue inhaler. Dyspnea on exertion. No chest pain or swelling. Had benign lumpectomy CXR 09/30/12 IMPRESSION:  Stable chronic lung disease. No acute cardiopulmonary abnormality.  Original Report Authenticated By: Roselyn Reef, M.D.  07/01/13- 42 yoF former smoker followed for COPD/ asthma, rhinitis,  complicated by atrial flutter. FOLLOWS FOR: "Breathing about the same" denies any wheezing, cough, or congestion. "May have SOB when doing something" Update handicapped parking. She feels well controlled. No longer much wheeze and rare need for rescue inhaler. Not coughing. Continues oxygen 2 L/[employer] for sleep  12/30/13- 79 yoF former smoker followed for COPD/ asthma, rhinitis,  complicated by atrial flutter. FOLLOWS FOR: SOB or wheezing with activity; otherwise doing well "doing  great". Oxygen 2 L/sleep/[employer]. Since outdoors on patio. Easy dyspnea on exertion. Meds are okay.  07/02/14-  19 yoF former smoker followed for COPD/ asthma, rhinitis,  complicated by atrial flutter. FOLLOWS FOR: SOB at  times and dry cough(? allergies). Continues to wear O2 at night Can't afford Spiriva now, but it has worked very well for her. We discussed re-trial of albuterol, which had overstimulated years ago.  12/31/14-   64 yoF former smoker followed for COPD/ asthma, rhinitis,  complicated by atrial flutter. Follows for:pt. is getting better, wears O2 2L/ [employer]  at night every night denies acute illness recently. Noticing dependent edema and feet for the past 2 weeks. Occasionally feels her palpitations. CXR 07/02/14 IMPRESSION: No active cardiopulmonary disease. Electronically Signed  By: Kathreen Devoid  On: 07/02/2014 13:59  07/06/15- 81 yoF former smoker followed for COPD/ asthma, chronic respiratory failure,  rhinitis,  complicated by atrial flutter. FOLLOWS FOR: pt wearing 2lpm 02 qhs.  pt has no breathing complaints currently, does note DOE, chest tightness with exertion.   O2 2L/ [employer] Has had flu vaccine. Feels very well. Still notices occasional palpitations/continues warfarin. No change in long-term dyspnea on exertion but she paces herself. Using rescue inhaler very occasionally and continues Spiriva CXR 07/02/14 IMPRESSION: No active cardiopulmonary disease. Electronically Signed  By: Kathreen Devoid  On: 07/02/2014 13:59  Review of Systems-see HPI Constitutional:   No-   weight loss, night sweats, fevers, chills, fatigue, lassitude. HEENT:   No-  headaches, difficulty swallowing, tooth/dental problems, sore throat,       No-  sneezing, itching, ear ache, no- nasal congestion, post nasal drip,  CV:  No-   chest pain, orthopnea, PND, swelling in lower extremities, anasarca,   dizziness, +palpitations Resp: + shortness of breath with exertion or at rest.             No-productive cough,  No non-productive cough,  No- coughing up of blood.   .  Skin: No-   rash or lesions. GI:  No-   heartburn, indigestion, abdominal pain, nausea, vomiting,  GU: MS:  No-   joint pain or  swelling. Neuro-     nothing unusual Psych:  No- change in mood or affect. No depression or anxiety.  No memory loss.    Objective:   Physical Exam General- Alert, Oriented, Affect-appropriate, Distress- none acute, petite Skin- rash-none, lesions- none, excoriation- none Lymphadenopathy- none Head- atraumatic            Eyes- Gross vision intact, PERRLA, conjunctivae clear secretions            Ears- Hearing, canals-normal            Nose- Clear, +Septal dev/ external deviation to right, No- mucus, polyps, erosion, perforation             Throat- Mallampati II , mucosa clear , drainage- none, tonsils- atrophic, dentures Neck- flexible , trachea midline, no stridor , thyroid nl, carotid no bruit Chest - symmetrical excursion , unlabored           Heart/CV- RRR( no pacemaker) , no murmur , no gallop  , no rub, nl s1 s2                           - JVD- none , edema +1, stasis changes- none, varices- none           Lung- + diminished but clear to P&A, no- cough ,  dullness-none, rub- none           Chest wall- + kyphosis Abd-  Br/ Gen/ Rectal- Not done, not indicated Extrem- cyanosis- none, clubbing, none, atrophy- none, strength- nl Neuro- + slight tremor right hand

## 2015-07-06 NOTE — Patient Instructions (Signed)
We can continue present meds  Please call if we can help 

## 2015-07-06 NOTE — Assessment & Plan Note (Signed)
Rhythm is regular at this visit. Coumadin long-term. Followed by cardiology.

## 2015-07-19 ENCOUNTER — Ambulatory Visit (INDEPENDENT_AMBULATORY_CARE_PROVIDER_SITE_OTHER): Payer: Medicare Other | Admitting: Pharmacist Clinician (PhC)/ Clinical Pharmacy Specialist

## 2015-07-19 DIAGNOSIS — Z7901 Long term (current) use of anticoagulants: Secondary | ICD-10-CM | POA: Diagnosis not present

## 2015-07-19 DIAGNOSIS — I4892 Unspecified atrial flutter: Secondary | ICD-10-CM | POA: Diagnosis not present

## 2015-07-19 LAB — POCT INR: INR: 2.5

## 2015-08-10 ENCOUNTER — Other Ambulatory Visit: Payer: Self-pay | Admitting: Cardiovascular Disease

## 2015-08-10 ENCOUNTER — Ambulatory Visit (INDEPENDENT_AMBULATORY_CARE_PROVIDER_SITE_OTHER): Payer: Medicare Other | Admitting: Pharmacist Clinician (PhC)/ Clinical Pharmacy Specialist

## 2015-08-10 DIAGNOSIS — I4892 Unspecified atrial flutter: Secondary | ICD-10-CM | POA: Diagnosis not present

## 2015-08-10 DIAGNOSIS — Z7901 Long term (current) use of anticoagulants: Secondary | ICD-10-CM | POA: Diagnosis not present

## 2015-08-10 LAB — POCT INR: INR: 2.1

## 2015-09-06 ENCOUNTER — Ambulatory Visit (INDEPENDENT_AMBULATORY_CARE_PROVIDER_SITE_OTHER): Payer: Medicare Other | Admitting: Pharmacist Clinician (PhC)/ Clinical Pharmacy Specialist

## 2015-09-06 DIAGNOSIS — Z7901 Long term (current) use of anticoagulants: Secondary | ICD-10-CM | POA: Diagnosis not present

## 2015-09-06 DIAGNOSIS — I4892 Unspecified atrial flutter: Secondary | ICD-10-CM

## 2015-09-06 LAB — POCT INR: INR: 1.9

## 2015-09-07 ENCOUNTER — Ambulatory Visit (INDEPENDENT_AMBULATORY_CARE_PROVIDER_SITE_OTHER): Payer: Medicare Other | Admitting: Neurology

## 2015-09-07 ENCOUNTER — Encounter: Payer: Self-pay | Admitting: Neurology

## 2015-09-07 VITALS — BP 160/87 | HR 86 | Ht 62.0 in | Wt 138.5 lb

## 2015-09-07 DIAGNOSIS — G250 Essential tremor: Secondary | ICD-10-CM | POA: Diagnosis not present

## 2015-09-07 HISTORY — DX: Essential tremor: G25.0

## 2015-09-07 MED ORDER — ALPRAZOLAM 0.25 MG PO TABS: 0.2500 mg | ORAL_TABLET | Freq: Three times a day (TID) | ORAL | Status: DC | PRN

## 2015-09-07 NOTE — Progress Notes (Signed)
Reason for visit: Essential tremor  Claudia [name] is an 82 y.o. female  History of present illness:  Claudia [name] is an 82 year old right-handed white female with a history of an essential tremor. The patient has progressed very slowly over time. She does have some issues with handwriting and feeding herself at times. She has done well throughout the year, she will have occasional urinary tract infections, but no other significant medical issues have come up. She takes alprazolam on occasion when she feels she needs something. She may take on average one to 2 tablets a day. The alprazolam may make her somewhat drowsy at times. The tremors are better in the morning, worse as the day goes on, they may be exacerbated by her inhalers that she takes for COPD. She has not had to give up any activities of daily living because of the tremor. She returns for an evaluation.  Past Medical History  Diagnosis Date  . Paroxysmal atrial flutter ([place]) 01/19/2009    echo - EF 0000000; grade 1 diastolic dysfunction; mitral valve- calcified annlus, mildly dilated R ventricle and atrium  . Abnormal chest x-ray   . COPD (chronic obstructive pulmonary disease) ([place])   . Chest pain 01/19/2009    myoview stress - LVEF >75% no reversible ischemia or infarction  . Abdominal pain, other specified site     [place] - negative abdominal ultrasound  . Hypertension   . Dyslipidemia   . PONV (postoperative nausea and vomiting)   . Pneumonia     hx  . Tremor, essential 09/07/2015    Past Surgical History  Procedure Laterality Date  . Tonsillectomy    . Cataract extraction    . Tubal ligation    . Tonsillectomy    . Eye surgery    . Breast lumpectomy with needle localization Right 10/02/2012    Procedure: BREAST LUMPECTOMY WITH NEEDLE LOCALIZATION;  Surgeon: Harl Bowie, MD;  Location: [place];  Service: General;  Laterality: Right;    Family History  Problem Relation Age of Onset  . Bone cancer Mother   . Cancer  Mother     bone  . Bone cancer Father   . Lung cancer Father   . Cancer Father     Lung  . Hyperlipidemia Father   . Hypertension Father   . Hypertension Sister   . Cancer Sister     breast  . Hyperlipidemia Sister   . Hyperlipidemia Sister   . Alzheimer's disease Sister   . Hypertension Sister   . Breast cancer Sister   . Hyperlipidemia Brother   . Hypertension Brother     Social history:  reports that she quit smoking about 28 years ago. Her smoking use included Cigarettes. She has a 50 pack-year smoking history. She has never used smokeless tobacco. She reports that she does not drink alcohol or use illicit drugs.    Allergies  Allergen Reactions  . Augmentin [Amoxicillin-Pot Clavulanate] Shortness Of Breath  . Doxycycline Shortness Of Breath  . Nitrofurantoin Shortness Of Breath  . Amiodarone Hcl Nausea And Vomiting  . Clarithromycin Other (See Comments)    Crazy feeling, confusion  . Clonazepam Other (See Comments)    Doesn't remember  . Codeine Nausea And Vomiting  . Diltiazem Hcl Nausea And Vomiting  . Fish Oil Nausea And Vomiting  . Flecainide Nausea Only  . Multaq [Dronedarone] Nausea Only  . Rosuvastatin Other (See Comments)    Leg cramps   . Zetia [Ezetimibe]  Other (See Comments)    Leg cramps    Medications:  Prior to Admission medications   Medication Sig Start Date End Date Taking? Authorizing Provider  acetaminophen (TYLENOL) 325 MG tablet Take 650 mg by mouth every 6 (six) hours as needed for pain.   Yes Historical Provider, MD  albuterol (PROAIR HFA) 108 (90 BASE) MCG/ACT inhaler 1-2 puffs every 6 hours as directed 12/31/14  Yes Clinton D Young, MD  ALPRAZolam Duanne Moron) 0.25 MG tablet Take 1 tablet (0.25 mg total) by mouth 3 (three) times daily as needed. 03/15/15  Yes Kathrynn Ducking, MD  amLODipine (NORVASC) 2.5 MG tablet Take 1 tablet (2.5 mg total) by mouth daily. 01/25/15  Yes Mihai Croitoru, MD  aspirin 81 MG tablet Take 81 mg by mouth daily.      Yes Historical Provider, MD  carboxymethylcellulose (REFRESH PLUS) 0.5 % SOLN 1 drop as needed.   Yes Historical Provider, MD  losartan (COZAAR) 25 MG tablet TAKE 1 TABLET BY MOUTH EVERY DAY 04/12/15  Yes Mihai Croitoru, MD  OXYGEN Inhale 2 L into the lungs. Use @@ Bedtime   Yes Historical Provider, MD  Polyvinyl Alcohol-Povidone (REFRESH OP) Apply 1 drop to eye 4 (four) times daily as needed (dry eyes).   Yes Historical Provider, MD  sotalol (BETAPACE) 80 MG tablet TAKE 1 TABLET BY MOUTH TWICE DAILY 08/10/15  Yes Mihai Croitoru, MD  tiotropium (SPIRIVA HANDIHALER) 18 MCG inhalation capsule 1 inhale daily 12/31/14  Yes Clinton D Young, MD  warfarin (COUMADIN) 1 MG tablet TAKE 1 TO 2 TABLETS BY MOUTH EVERY DAY AS DIRECTED BY COUMADIN CLINIC 03/22/15  Yes Mihai Croitoru, MD    ROS:  Out of a complete 14 system review of symptoms, the patient complains only of the following symptoms, and all other reviewed systems are negative.  Shortness of breath  Palpitations of the heart  Back pain, neck stiffness  Tremors    Blood pressure 160/87, pulse 86, height 5\' 2"  (1.575 m), weight 138 lb 8 oz (62.823 kg).  Physical Exam  General: The patient is alert and cooperative at the time of the examination.  Skin: No significant peripheral edema is noted.   Neurologic Exam  Mental status: The patient is alert and oriented x 3 at the time of the examination. The patient has apparent normal recent and remote memory, with an apparently normal attention span and concentration ability.   Cranial nerves: Facial symmetry is present. Speech is normal, no aphasia or dysarthria is noted. Extraocular movements are full. Visual fields are full. The patient has a side-to-side head tremor.  Motor: The patient has good strength in all 4 extremities. The patient has evidence of an intention tremor with finger-nose-finger, right side being slightly worse in the left.  Sensory examination: Soft touch sensation is  symmetric on the face, arms, and legs.  Coordination: The patient has good finger-nose-finger and heel-to-shin bilaterally.  Gait and station: The patient has a normal gait. Tandem gait is minimally unsteady. Romberg is negative. No drift is seen.  Reflexes: Deep tendon reflexes are symmetric.   Assessment/Plan:  1. Essential tremor   The patient is overall is doing fairly well. The patient will continue the alprazolam if needed. A prescription was written. She will follow-up in one year, or sooner if needed.  Jill Alexanders MD 09/07/2015 10:01 AM  [facility] [address] [place], [place] [address]  Phone [phone] Fax [phone]

## 2015-09-07 NOTE — Patient Instructions (Signed)
Tremor °A tremor is trembling or shaking that you cannot control. Most tremors affect the hands or arms. Tremors can also affect the head, vocal cords, face, and other parts of the body. There are many types of tremors. Common types include:  °· Essential tremor. These usually occur in people over the age of 40. It may run in families and can happen in otherwise healthy people.   °· Resting tremor. These occur when the muscles are at rest, such as when your hands are resting in your lap. People with Parkinson disease often have resting tremors.   °· Postural tremor. These occur when you try to hold a pose, such as keeping your hands outstretched.   °· Kinetic tremor. These occur during purposeful movement, such as trying to touch a finger to your nose.   °· Task-specific tremor. These may occur when you perform tasks such as handwriting, speaking, or standing.   °· Psychogenic tremor. These dramatically lessen or disappear when you are distracted. They can happen in people of all ages.   °Some types of tremors have no known cause. Tremors can also be a symptom of nervous system problems (neurological disorders) that may occur with aging. Some tremors go away with treatment while others do not.  °HOME CARE INSTRUCTIONS °Watch your tremor for any changes. The following actions may help to lessen any discomfort you are feeling: °· Take medicines only as directed by your health care provider.   °· Limit alcohol intake to no more than 1 drink per day for nonpregnant women and 2 drinks per day for men. One drink equals 12 oz of beer, 5 oz of wine, or 1½ oz of hard liquor. °· Do not use any tobacco products, including cigarettes, chewing tobacco, or electronic cigarettes. If you need help quitting, ask your health care provider.   °· Avoid extreme heat or cold.    °· Limit the amount of caffeine you consume as directed by your health care provider.   °· Try to get 8 hours of sleep each night. °· Find ways to manage your  stress, such as meditation or yoga. °· Keep all follow-up visits as directed by your health care provider. This is important. °SEEK MEDICAL CARE IF: °· You start having a tremor after starting a new medicine. °· You have tremor with other symptoms such as: °¨ Numbness. °¨ Tingling. °¨ Pain. °¨ Weakness. °· Your tremor gets worse. °· Your tremor interferes with your day-to-day life. °  °This information is not intended to replace advice given to you by your health care provider. Make sure you discuss any questions you have with your health care provider. °  °Document Released: 07/21/2002 Document Revised: 08/21/2014 Document Reviewed: 01/26/2014 °Elsevier Interactive Patient Education ©2016 Elsevier Inc. ° °

## 2015-09-18 ENCOUNTER — Other Ambulatory Visit: Payer: Self-pay | Admitting: Cardiovascular Disease

## 2015-09-21 ENCOUNTER — Other Ambulatory Visit: Payer: Self-pay | Admitting: Internal Medicine

## 2015-09-27 ENCOUNTER — Ambulatory Visit (INDEPENDENT_AMBULATORY_CARE_PROVIDER_SITE_OTHER): Payer: Medicare Other | Admitting: Pharmacist Clinician (PhC)/ Clinical Pharmacy Specialist

## 2015-09-27 DIAGNOSIS — I4892 Unspecified atrial flutter: Secondary | ICD-10-CM

## 2015-09-27 DIAGNOSIS — Z7901 Long term (current) use of anticoagulants: Secondary | ICD-10-CM | POA: Diagnosis not present

## 2015-09-27 LAB — POCT INR: INR: 2.1

## 2015-11-08 ENCOUNTER — Ambulatory Visit (INDEPENDENT_AMBULATORY_CARE_PROVIDER_SITE_OTHER): Payer: Medicare Other | Admitting: Pharmacist Clinician (PhC)/ Clinical Pharmacy Specialist

## 2015-11-08 DIAGNOSIS — I4892 Unspecified atrial flutter: Secondary | ICD-10-CM

## 2015-11-08 DIAGNOSIS — Z7901 Long term (current) use of anticoagulants: Secondary | ICD-10-CM

## 2015-11-08 LAB — POCT INR: INR: 2.4

## 2015-12-20 ENCOUNTER — Ambulatory Visit (INDEPENDENT_AMBULATORY_CARE_PROVIDER_SITE_OTHER): Payer: Medicare Other | Admitting: Pharmacist Clinician (PhC)/ Clinical Pharmacy Specialist

## 2015-12-20 DIAGNOSIS — Z7901 Long term (current) use of anticoagulants: Secondary | ICD-10-CM | POA: Diagnosis not present

## 2015-12-20 DIAGNOSIS — I4892 Unspecified atrial flutter: Secondary | ICD-10-CM | POA: Diagnosis not present

## 2015-12-20 LAB — POCT INR: INR: 2.5

## 2016-01-02 ENCOUNTER — Other Ambulatory Visit: Payer: Self-pay | Admitting: Cardiovascular Disease

## 2016-01-04 ENCOUNTER — Encounter: Payer: Self-pay | Admitting: Internal Medicine

## 2016-01-04 ENCOUNTER — Ambulatory Visit (INDEPENDENT_AMBULATORY_CARE_PROVIDER_SITE_OTHER): Payer: Medicare Other | Admitting: Internal Medicine

## 2016-01-04 ENCOUNTER — Ambulatory Visit (INDEPENDENT_AMBULATORY_CARE_PROVIDER_SITE_OTHER)
Admission: RE | Admit: 2016-01-04 | Discharge: 2016-01-04 | Disposition: A | Discharge status: Home / Self Care | Payer: Medicare Other | Source: Ambulatory Visit | Attending: Internal Medicine | Admitting: Internal Medicine

## 2016-01-04 VITALS — BP 132/76 | HR 61 | Ht 62.0 in | Wt 137.2 lb

## 2016-01-04 DIAGNOSIS — I10 Essential (primary) hypertension: Secondary | ICD-10-CM | POA: Diagnosis not present

## 2016-01-04 DIAGNOSIS — J449 Chronic obstructive pulmonary disease, unspecified: Secondary | ICD-10-CM

## 2016-01-04 MED ORDER — ALBUTEROL SULFATE HFA 108 (90 BASE) MCG/ACT IN AERS: INHALATION_SPRAY | RESPIRATORY_TRACT | Status: AC

## 2016-01-04 MED ORDER — TIOTROPIUM BROMIDE MONOHYDRATE 18 MCG IN CAPS: ORAL_CAPSULE | RESPIRATORY_TRACT | Status: AC

## 2016-01-04 NOTE — Progress Notes (Signed)
Patient ID: Claudia [name], female   DOB: 12-31-1932, 82 y.o.   MRN: [id number]  HPI 12/26/10- 43 yoF follwed for COPD/ asthma, rhinitis,  complicated by atrial flutter. Last here [name] 21, 2011 when we reviewed PFT with moderate obstruction, normal 6MWT.  Last CXR was at hospital in Feb for atrial fib, showing CE, COPD, NAD.  Breathing was "rough" with increased shortness of breath w/o wheeze in March. Nose was running. She doesn't know if it was pollen or something about a heart med, but it got better. Throat feels full, lying in bed at night. Admits heartburn.Short of breath with steady walking and stairs. Spiriva helps.   06/28/11- 38 yoF follwed for COPD/ asthma, rhinitis,  complicated by atrial flutter. Has had flu vax. Feels comfortable, accepting unchanged dyspnea on exertion with house cleaning or brisk walks. Paces herself ok. Afib stays controlled/ Dr Rex Kras. Accepts cost of Spiriva and finds she needs it almost every day. She wanted to take it every other day to cut costs, but can't get away with that without a decline in her breathing. Med refills discussed.   11/01/11- 37 yoF follwed for COPD/ asthma, rhinitis,  complicated by atrial flutter. 2 weeks ago went to urgent care for head congestion. Given antihistamine and steroid nasal sprays which did not help. Still feels stopped up with no headache. Now for 2 days has been coughing productive yellow. Denies fever, sneeze, itch, sore throat. Slight wheeze. She smoked 2 packs per day until 1989. Using home oxygen 2 L for sleep. Doesn't know name of her DME company.  12/27/11- 71 yoF follwed for COPD/ asthma, rhinitis,  complicated by atrial flutter. Slight wheezing, cough, congestion, and SOB at times-depends on what patient is doing Resolved bronchitis after last visit. She had another episode of acute bronchitis with her husband in early April. Now feels well. Oxygen 2 L for sleep. Rare need for rescue inhaler. Continues  Spiriva.  07/02/12- 17 yoF followed for COPD/ asthma, rhinitis,  complicated by atrial flutter. FOLLOWS FOR: breathing doing good overall unless active-walking or cleaning house; uses [employer] for QHS O2-needs to have yearly check for insurance purposes to pay for O2 2L/ sleep/ [employer]. Has had pneumonia vaccine after age 32. Had flu vaccine. She feels well today. Discussed cost of Spiriva. Dyspnea on exertion climbing stairs, cleaning house or doing laundry. CXR 12/28/11 IMPRESSION:  Emphysema right upper lobe scar. No acute finding. Stable  compared to prior exam.  Original Report Authenticated By: Arvid Right. D'ALESSIO, M.D.    12/31/12- 43 yoF followed for COPD/ asthma, rhinitis,  complicated by atrial flutter. FOLLOWS FOR: SOB with activity, otherwise doing well. Pt did not have ONO that was ordered at last visit 07-02-12. O2 2L/ [employer]. No change in her breathing. Seldom dry cough. Occasional rescue inhaler. Dyspnea on exertion. No chest pain or swelling. Had benign lumpectomy CXR 09/30/12 IMPRESSION:  Stable chronic lung disease. No acute cardiopulmonary abnormality.  Original Report Authenticated By: Roselyn Reef, M.D.  07/01/13- 42 yoF former smoker followed for COPD/ asthma, rhinitis,  complicated by atrial flutter. FOLLOWS FOR: "Breathing about the same" denies any wheezing, cough, or congestion. "May have SOB when doing something" Update handicapped parking. She feels well controlled. No longer much wheeze and rare need for rescue inhaler. Not coughing. Continues oxygen 2 L/[employer] for sleep  12/30/13- 79 yoF former smoker followed for COPD/ asthma, rhinitis,  complicated by atrial flutter. FOLLOWS FOR: SOB or wheezing with activity; otherwise doing well "doing  great". Oxygen 2 L/sleep/[employer]. Since outdoors on patio. Easy dyspnea on exertion. Meds are okay.  07/02/14-  35 yoF former smoker followed for COPD/ asthma, rhinitis,  complicated by atrial flutter. FOLLOWS FOR: SOB at  times and dry cough(? allergies). Continues to wear O2 at night Can't afford Spiriva now, but it has worked very well for her. We discussed re-trial of albuterol, which had overstimulated years ago.  12/31/14-   45 yoF former smoker followed for COPD/ asthma, rhinitis,  complicated by atrial flutter. Follows for:pt. is getting better, wears O2 2L/ [employer]  at night every night denies acute illness recently. Noticing dependent edema and feet for the past 2 weeks. Occasionally feels her palpitations. CXR 07/02/14 IMPRESSION: No active cardiopulmonary disease. Electronically Signed  By: Kathreen Devoid  On: 07/02/2014 13:59  07/06/15- 81 yoF former smoker followed for COPD/ asthma, chronic respiratory failure,  rhinitis,  complicated by atrial flutter. FOLLOWS FOR: pt wearing 2lpm 02 qhs.  pt has no breathing complaints currently, does note DOE, chest tightness with exertion.   O2 2L/ [employer] Has had flu vaccine. Feels very well. Still notices occasional palpitations/continues warfarin. No change in long-term dyspnea on exertion but she paces herself. Using rescue inhaler very occasionally and continues Spiriva CXR 07/02/14 IMPRESSION: No active cardiopulmonary disease. Electronically Signed  By: Kathreen Devoid  On: 07/02/2014 13:59  01/04/2016-83 year old female former smoker followed for COPD/asthma, chronic respiratory failure, rhinitis, complicated by atrial flutter O2 2L/ sleep [employer] FOLLOWS FOR: pt has good and bad days- has increased SOB with exertion, warm/humid weather.        Review of Systems-see HPI Constitutional:   No-   weight loss, night sweats, fevers, chills, fatigue, lassitude. HEENT:   No-  headaches, difficulty swallowing, tooth/dental problems, sore throat,       No-  sneezing, itching, ear ache, no- nasal congestion, post nasal drip,  CV:  No-   chest pain, orthopnea, PND, swelling in lower extremities, anasarca,   dizziness, +palpitations Resp: + shortness of  breath with exertion or at rest.             No-productive cough,  No non-productive cough,  No- coughing up of blood.   .  Skin: No-   rash or lesions. GI:  No-   heartburn, indigestion, abdominal pain, nausea, vomiting,  GU: MS:  No-   joint pain or swelling. Neuro-     nothing unusual Psych:  No- change in mood or affect. No depression or anxiety.  No memory loss.    Objective:   Physical Exam General- Alert, Oriented, Affect-appropriate, Distress- none acute, petite Skin- rash-none, lesions- none, excoriation- none Lymphadenopathy- none Head- atraumatic            Eyes- Gross vision intact, PERRLA, conjunctivae clear secretions            Ears- Hearing, canals-normal            Nose- Clear, +Septal dev/ external deviation to right, No- mucus, polyps, erosion, perforation             Throat- Mallampati II , mucosa clear , drainage- none, tonsils- atrophic, dentures Neck- flexible , trachea midline, no stridor , thyroid nl, carotid no bruit Chest - symmetrical excursion , unlabored           Heart/CV- RRR( no pacemaker) , no murmur , no gallop  , no rub, nl s1 s2                           -  JVD- none , edema +1, stasis changes- none, varices- none           Lung- + diminished but clear to P&A, no- cough , dullness-none, rub- none           Chest wall- + kyphosis Abd-  Br/ Gen/ Rectal- Not done, not indicated Extrem- cyanosis- none, clubbing, none, atrophy- none, strength- nl Neuro- + slight tremor right hand

## 2016-01-04 NOTE — Patient Instructions (Signed)
Order- CXR      Dx COPD mixed type  Refill scripts printed   Please call as needed

## 2016-01-04 NOTE — Telephone Encounter (Signed)
Rx(s) sent to pharmacy electronically.  

## 2016-01-06 ENCOUNTER — Telehealth: Payer: Self-pay | Admitting: *Deleted

## 2016-01-06 DIAGNOSIS — R911 Solitary pulmonary nodule: Secondary | ICD-10-CM

## 2016-01-06 NOTE — Telephone Encounter (Signed)
CXR ordered per CY. Patient aware. Nothing further needed.

## 2016-01-15 ENCOUNTER — Other Ambulatory Visit: Payer: Self-pay | Admitting: Neurology

## 2016-01-17 NOTE — Telephone Encounter (Signed)
Rx printed, signed, faxed to pharmacy. 

## 2016-01-19 ENCOUNTER — Ambulatory Visit (INDEPENDENT_AMBULATORY_CARE_PROVIDER_SITE_OTHER): Payer: Medicare Other | Admitting: Cardiovascular Disease

## 2016-01-19 ENCOUNTER — Encounter: Payer: Self-pay | Admitting: Cardiovascular Disease

## 2016-01-19 ENCOUNTER — Ambulatory Visit (INDEPENDENT_AMBULATORY_CARE_PROVIDER_SITE_OTHER): Payer: Medicare Other | Admitting: Pharmacist Clinician (PhC)/ Clinical Pharmacy Specialist

## 2016-01-19 VITALS — BP 150/78 | HR 64 | Ht 62.0 in | Wt 137.4 lb

## 2016-01-19 DIAGNOSIS — I480 Paroxysmal atrial fibrillation: Secondary | ICD-10-CM | POA: Diagnosis not present

## 2016-01-19 DIAGNOSIS — J449 Chronic obstructive pulmonary disease, unspecified: Secondary | ICD-10-CM

## 2016-01-19 DIAGNOSIS — I4892 Unspecified atrial flutter: Secondary | ICD-10-CM

## 2016-01-19 DIAGNOSIS — Z79899 Other long term (current) drug therapy: Secondary | ICD-10-CM | POA: Diagnosis not present

## 2016-01-19 DIAGNOSIS — I10 Essential (primary) hypertension: Secondary | ICD-10-CM | POA: Diagnosis not present

## 2016-01-19 DIAGNOSIS — Z7901 Long term (current) use of anticoagulants: Secondary | ICD-10-CM

## 2016-01-19 DIAGNOSIS — J45909 Unspecified asthma, uncomplicated: Secondary | ICD-10-CM

## 2016-01-19 DIAGNOSIS — E785 Hyperlipidemia, unspecified: Secondary | ICD-10-CM

## 2016-01-19 DIAGNOSIS — I4891 Unspecified atrial fibrillation: Secondary | ICD-10-CM | POA: Diagnosis not present

## 2016-01-19 DIAGNOSIS — J9611 Chronic respiratory failure with hypoxia: Secondary | ICD-10-CM

## 2016-01-19 LAB — POCT INR: INR: 3

## 2016-01-19 LAB — CBC
HCT: 41.5 % (ref 35.0–45.0)
HEMOGLOBIN: 13.2 g/dL (ref 11.7–15.5)
MCH: 27.6 pg (ref 27.0–33.0)
MCHC: 31.8 g/dL — ABNORMAL LOW (ref 32.0–36.0)
MCV: 86.6 fL (ref 80.0–100.0)
MPV: 10.1 fL (ref 7.5–12.5)
Platelets: 196 10*3/uL (ref 140–400)
RBC: 4.79 MIL/uL (ref 3.80–5.10)
RDW: 15.2 % — ABNORMAL HIGH (ref 11.0–15.0)
WBC: 6.6 10*3/uL (ref 3.8–10.8)

## 2016-01-19 NOTE — Progress Notes (Signed)
Patient ID: Claudia [name], female   DOB: 1932-10-20, 82 y.o.   MRN: [id number]     Cardiology Office Note    Date:  01/21/2016   ID:  Candiss Norse Mcinerny, DOB [birth year], MRN [id number]  PCP:  Thurman Coyer, MD  Cardiologist:   Sanda Klein, MD   Chief Complaint  Patient presents with  . Annual Exam    pt c/o occasional SOB (COPD); no other Sx.    History of Present Illness:  Claudia [name] is a 82 y.o. female with long-standing COPD and a history of paroxysmal atrial flutter and atrial fibrillation. Previous antiarrhythmic therapy with flecainide and drawn Enduron was not tolerated, she is currently well controlled on sotalol. She does not seem to have any wheezing related to the antiarrhythmic/beta blocker drug. She has not had any palpitations that remind her of previous atrial fibrillation over the last 12 months. She has not experienced syncope and she denies chest pain. Her shortness of breath is at baseline. She is on warfarin anticoagulation and has not had any bleeding. Ankle edema is a rare occurrence and is always short lived. She had a chest x-ray performed in late May that showed a very questionable "spot" on her lung.  Her echocardiogram today shows sinus rhythm with a corrected QT interval of 451 ms.    Past Medical History  Diagnosis Date  . Paroxysmal atrial flutter ([place]) 01/19/2009    echo - EF 0000000; grade 1 diastolic dysfunction; mitral valve- calcified annlus, mildly dilated R ventricle and atrium  . Abnormal chest x-ray   . COPD (chronic obstructive pulmonary disease) ([place])   . Chest pain 01/19/2009    myoview stress - LVEF >75% no reversible ischemia or infarction  . Abdominal pain, other specified site     [place] - negative abdominal ultrasound  . Hypertension   . Dyslipidemia   . PONV (postoperative nausea and vomiting)   . Pneumonia     hx  . Tremor, essential 09/07/2015    Past Surgical History  Procedure Laterality Date  . Tonsillectomy    . Cataract  extraction    . Tubal ligation    . Tonsillectomy    . Eye surgery    . Breast lumpectomy with needle localization Right 10/02/2012    Procedure: BREAST LUMPECTOMY WITH NEEDLE LOCALIZATION;  Surgeon: Harl Bowie, MD;  Location: [facility];  Service: General;  Laterality: Right;    Current Medications: Outpatient Prescriptions Prior to Visit  Medication Sig Dispense Refill  . acetaminophen (TYLENOL) 325 MG tablet Take 650 mg by mouth every 6 (six) hours as needed for pain.    Marland Kitchen albuterol (PROAIR HFA) 108 (90 Base) MCG/ACT inhaler INHALE 2 PUFFS EVERY 6 HOURS AS NEEDED FOR WHEEZING OR SHORTNESS OF BREATH 8.5 g 12  . ALPRAZolam (XANAX) 0.25 MG tablet TAKE 1 TABLET BY MOUTH THREE TIMES DAILY AS NEEDED 90 tablet 0  . amLODipine (NORVASC) 2.5 MG tablet TAKE 1 TABLET BY MOUTH DAILY 30 tablet 10  . aspirin 81 MG tablet Take 81 mg by mouth daily.      . carboxymethylcellulose (REFRESH PLUS) 0.5 % SOLN 1 drop as needed.    Marland Kitchen losartan (COZAAR) 25 MG tablet TAKE 1 TABLET BY MOUTH EVERY DAY 90 tablet 3  . OXYGEN Inhale 2 L into the lungs. Use @@ Bedtime    . Polyvinyl Alcohol-Povidone (REFRESH OP) Apply 1 drop to eye 4 (four) times daily as needed (dry eyes).    . sotalol (  BETAPACE) 80 MG tablet TAKE 1 TABLET BY MOUTH TWICE DAILY 60 tablet 5  . tiotropium (SPIRIVA HANDIHALER) 18 MCG inhalation capsule 1 inhale daily 30 capsule 12  . warfarin (COUMADIN) 1 MG tablet TAKE 1 TO 2 TABLETS BY MOUTH EVERY DAY AS DIRECTED BY COUMADIN CLINIC 135 tablet 1   No facility-administered medications prior to visit.     Allergies:   Augmentin; Doxycycline; Nitrofurantoin; Amiodarone hcl; Clarithromycin; Clonazepam; Codeine; Diltiazem hcl; Fish oil; Flecainide; Multaq; Rosuvastatin; and Zetia   Social History   Social History  . Marital Status: Married    Spouse Name: Claudia [name]  . Number of Children: 3  . Years of Education: 12   Occupational History  . retired    Social History Main Topics  . Smoking status:  Former Smoker -- 2.00 packs/day for 25 years    Types: Cigarettes    Quit date: 08/15/1987  . Smokeless tobacco: Never Used  . Alcohol Use: No  . Drug Use: No  . Sexual Activity: Not Asked   Other Topics Concern  . None   Social History Narrative   Patient lives with her husband Claudia [name]).   Patient has 3 children.   Patient is right-handed.   Patient drinks 1 16 oz of Coke daily.   Patient is retired.   Patient has a high school education.     Family History:  The patient's family history includes Alzheimer's disease in her sister; Bone cancer in her father and mother; Breast cancer in her sister; Cancer in her father, mother, and sister; Hyperlipidemia in her brother, father, sister, and sister; Hypertension in her brother, father, sister, and sister; Lung cancer in her father.   ROS:   Please see the history of present illness.    ROS All other systems reviewed and are negative.   PHYSICAL EXAM:   VS:  BP 150/78 mmHg  Pulse 64  Ht 5\' 2"  (1.575 m)  Wt 62.324 kg (137 lb 6.4 oz)  BMI 25.12 kg/m2   GEN: Well nourished, well developed, in no acute distress HEENT: normal Neck: no JVD, carotid bruits, or masses Cardiac: RRR; no murmurs, rubs, or gallops,no edema  Respiratory:  clear to auscultation bilaterally, normal work of breathing GI: soft, nontender, nondistended, + BS MS: no deformity or atrophy Skin: warm and dry, no rash Neuro:  Alert and Oriented x 3, Strength and sensation are intact Psych: euthymic mood, full affect  Wt Readings from Last 3 Encounters:  01/19/16 62.324 kg (137 lb 6.4 oz)  01/04/16 62.234 kg (137 lb 3.2 oz)  09/07/15 62.823 kg (138 lb 8 oz)      Studies/Labs Reviewed:   EKG:  EKG is ordered today.  The ekg ordered today demonstrates Sinus rhythm, QTC 451 ms  Recent Labs: 01/19/2016: ALT 8; BUN 13; Creat 0.98*; Hemoglobin 13.2; Platelets 196; Potassium 4.8; Sodium 142   Lipid Panel    Component Value Date/Time   CHOL 202* 01/22/2014 0819    TRIG 100 01/22/2014 0819   HDL 54 01/22/2014 0819   CHOLHDL 3.7 01/22/2014 0819   VLDL 20 01/22/2014 0819   LDLCALC 128* 01/22/2014 0819      ASSESSMENT:    1. Paroxysmal atrial fibrillation (HCC)   2. Long term (current) use of anticoagulants   3. Essential hypertension   4. COPD with asthma ([place])   5. Chronic respiratory failure with hypoxia (HCC)   6. Hyperlipidemia   7. Medication management      PLAN:  In order  of problems listed above:  1. AFib: Currently very well controlled. Reminded her of the risk of multiple drug interactions with QT prolongation while taking sotalol. Needs an electrocardiogram and assessment of electrolytes and renal function based once every 6 months. CHADSVasc at least 4 (age 20, gender, HTN) 2. Warfarin anticoagulation well-tolerated. 3. HTN: Good control. Ankle swelling improved when we reduce the dose of amlodipine. 4. COPD: She told me Dr. Annamaria Boots plans to retire. She will miss him 5. Uses oxygen supplementation only at night 6. HLP: No known coronary or vascular problems. Needs an updated lipid profile from her primary care provider. She was intolerant of Crestor and Zetia.    Medication Adjustments/Labs and Tests Ordered: Current medicines are reviewed at length with the patient today.  Concerns regarding medicines are outlined above.  Medication changes, Labs and Tests ordered today are listed in the Patient Instructions below. Patient Instructions  Medication Instructions: Dr Sallyanne Kuster recommends that you continue on your current medications as directed. Please refer to the Current Medication list given to you today.  Labwork: Your physician recommends that you return for lab work at your earliest convenience.  Testing/Procedures: NONE ORDERED  Follow-up: Dr Sallyanne Kuster recommends that you schedule a follow-up appointment in 6 months. You will receive a reminder letter in the mail two months in advance. If you don't receive a letter,  please call our office to schedule the follow-up appointment.  If you need a refill on your cardiac medications before your next appointment, please call your pharmacy.    Signed, Sanda Klein, MD  01/21/2016 1:23 PM    [facility] [address], [place], [place]  [address] Phone: [phone]; Fax: [phone]

## 2016-01-19 NOTE — Patient Instructions (Signed)
Medication Instructions: Dr Sallyanne Kuster recommends that you continue on your current medications as directed. Please refer to the Current Medication list given to you today.  Labwork: Your physician recommends that you return for lab work at your earliest convenience.  Testing/Procedures: NONE ORDERED  Follow-up: Dr Sallyanne Kuster recommends that you schedule a follow-up appointment in 6 months. You will receive a reminder letter in the mail two months in advance. If you don't receive a letter, please call our office to schedule the follow-up appointment.  If you need a refill on your cardiac medications before your next appointment, please call your pharmacy.

## 2016-01-20 ENCOUNTER — Encounter: Payer: Self-pay | Admitting: Cardiovascular Disease

## 2016-01-20 LAB — COMPREHENSIVE METABOLIC PANEL
ALBUMIN: 3.9 g/dL (ref 3.6–5.1)
ALT: 8 U/L (ref 6–29)
AST: 16 U/L (ref 10–35)
Alkaline Phosphatase: 86 U/L (ref 33–130)
BILIRUBIN TOTAL: 0.5 mg/dL (ref 0.2–1.2)
BUN: 13 mg/dL (ref 7–25)
CO2: 29 mmol/L (ref 20–31)
CREATININE: 0.98 mg/dL — AB (ref 0.60–0.88)
Calcium: 9.2 mg/dL (ref 8.6–10.4)
Chloride: 104 mmol/L (ref 98–110)
Glucose, Bld: 77 mg/dL (ref 65–99)
Potassium: 4.8 mmol/L (ref 3.5–5.3)
SODIUM: 142 mmol/L (ref 135–146)
TOTAL PROTEIN: 5.6 g/dL — AB (ref 6.1–8.1)

## 2016-01-31 ENCOUNTER — Ambulatory Visit (INDEPENDENT_AMBULATORY_CARE_PROVIDER_SITE_OTHER)
Admission: RE | Admit: 2016-01-31 | Discharge: 2016-01-31 | Disposition: A | Discharge status: Home / Self Care | Payer: Medicare Other | Source: Ambulatory Visit | Attending: Internal Medicine | Admitting: Internal Medicine

## 2016-01-31 DIAGNOSIS — R911 Solitary pulmonary nodule: Secondary | ICD-10-CM | POA: Diagnosis not present

## 2016-01-31 DIAGNOSIS — R918 Other nonspecific abnormal finding of lung field: Secondary | ICD-10-CM | POA: Diagnosis not present

## 2016-02-12 ENCOUNTER — Other Ambulatory Visit: Payer: Self-pay | Admitting: Cardiovascular Disease

## 2016-02-21 ENCOUNTER — Ambulatory Visit (INDEPENDENT_AMBULATORY_CARE_PROVIDER_SITE_OTHER): Payer: Medicare Other | Admitting: Pharmacist

## 2016-02-21 DIAGNOSIS — I4892 Unspecified atrial flutter: Secondary | ICD-10-CM

## 2016-02-21 DIAGNOSIS — Z7901 Long term (current) use of anticoagulants: Secondary | ICD-10-CM

## 2016-02-21 LAB — POCT INR: INR: 2.4

## 2016-03-27 ENCOUNTER — Other Ambulatory Visit: Payer: Self-pay | Admitting: Neurology

## 2016-03-29 NOTE — Telephone Encounter (Signed)
Rx printed, signed, faxed to pharmacy. 

## 2016-03-29 NOTE — Telephone Encounter (Signed)
Opened in error

## 2016-03-30 ENCOUNTER — Telehealth: Payer: Self-pay | Admitting: Neurology

## 2016-03-30 NOTE — Telephone Encounter (Signed)
Patient called to advise, [facility] told her that Rx for ALPRAZolam (XANAX) 0.25 MG tablet was refused by our office. Patient advised per nurse Anderson Malta, Rx was sent to [facility] [address] on August 16th, fax verification log shows, faxed at 8:10am.

## 2016-03-30 NOTE — Telephone Encounter (Signed)
Rx re faxed to pharmacy

## 2016-04-02 ENCOUNTER — Other Ambulatory Visit: Payer: Self-pay | Admitting: Cardiovascular Disease

## 2016-04-03 ENCOUNTER — Encounter (INDEPENDENT_AMBULATORY_CARE_PROVIDER_SITE_OTHER): Payer: Self-pay

## 2016-04-03 ENCOUNTER — Ambulatory Visit (INDEPENDENT_AMBULATORY_CARE_PROVIDER_SITE_OTHER): Payer: Medicare Other | Admitting: Pharmacist Clinician (PhC)/ Clinical Pharmacy Specialist

## 2016-04-03 DIAGNOSIS — I4892 Unspecified atrial flutter: Secondary | ICD-10-CM | POA: Diagnosis not present

## 2016-04-03 DIAGNOSIS — Z7901 Long term (current) use of anticoagulants: Secondary | ICD-10-CM

## 2016-04-03 LAB — POCT INR: INR: 2.8

## 2016-04-03 NOTE — Telephone Encounter (Signed)
Rx request sent to pharmacy.  

## 2016-04-04 ENCOUNTER — Encounter: Payer: Self-pay | Admitting: *Deleted

## 2016-04-04 NOTE — Progress Notes (Signed)
Faxed completed [place] request for formulary exception for xanax to [facility]. Fax: [phone]. Received confirmation. Awaiting response.

## 2016-04-05 ENCOUNTER — Telehealth: Payer: Self-pay | Admitting: *Deleted

## 2016-04-05 NOTE — Telephone Encounter (Signed)
[facility] [phone] The medication has been denied at this time. A fax has been sent today

## 2016-04-05 NOTE — Telephone Encounter (Signed)
Received denial letter, via fax, from [facility] [phone]) for the coverage of alprazolam (member [phone]).  Faxed information to [facility], along with a coupon from [employer], to honor the discounted price of $13.01 for 90 tablets of alprazolam 0.25mg .

## 2016-04-05 NOTE — Telephone Encounter (Signed)
I'm sorry I was not able to start on it.

## 2016-04-05 NOTE — Telephone Encounter (Signed)
Duplicate encounter opened in error.

## 2016-04-05 NOTE — Telephone Encounter (Signed)
Received denial letter, via fax, from [facility] [phone]) for the coverage of alprazolam (member [phone]).  Faxed information to [facility], along with a coupon from [employer], to honor the discounted price of $13.01 for 90 tablets of alprazolam 0.25mg .

## 2016-04-10 ENCOUNTER — Other Ambulatory Visit: Payer: Self-pay | Admitting: Cardiovascular Disease

## 2016-04-18 DIAGNOSIS — H524 Presbyopia: Secondary | ICD-10-CM | POA: Diagnosis not present

## 2016-04-18 DIAGNOSIS — H04123 Dry eye syndrome of bilateral lacrimal glands: Secondary | ICD-10-CM | POA: Diagnosis not present

## 2016-05-08 DIAGNOSIS — L218 Other seborrheic dermatitis: Secondary | ICD-10-CM | POA: Diagnosis not present

## 2016-05-08 DIAGNOSIS — L570 Actinic keratosis: Secondary | ICD-10-CM | POA: Diagnosis not present

## 2016-05-08 DIAGNOSIS — L821 Other seborrheic keratosis: Secondary | ICD-10-CM | POA: Diagnosis not present

## 2016-05-08 DIAGNOSIS — L723 Sebaceous cyst: Secondary | ICD-10-CM | POA: Diagnosis not present

## 2016-05-08 DIAGNOSIS — X32XXXD Exposure to sunlight, subsequent encounter: Secondary | ICD-10-CM | POA: Diagnosis not present

## 2016-05-15 ENCOUNTER — Ambulatory Visit (INDEPENDENT_AMBULATORY_CARE_PROVIDER_SITE_OTHER): Payer: Medicare Other | Admitting: Pharmacist

## 2016-05-15 DIAGNOSIS — I4892 Unspecified atrial flutter: Secondary | ICD-10-CM

## 2016-05-15 DIAGNOSIS — Z7901 Long term (current) use of anticoagulants: Secondary | ICD-10-CM | POA: Diagnosis not present

## 2016-05-15 LAB — POCT INR: INR: 2.8

## 2016-05-16 DIAGNOSIS — Z23 Encounter for immunization: Secondary | ICD-10-CM | POA: Diagnosis not present

## 2016-05-30 DIAGNOSIS — Z87440 Personal history of urinary (tract) infections: Secondary | ICD-10-CM | POA: Diagnosis not present

## 2016-06-10 ENCOUNTER — Other Ambulatory Visit: Payer: Self-pay | Admitting: Neurology

## 2016-06-12 NOTE — Telephone Encounter (Signed)
Rx printed, signed, faxed to pharmacy. 

## 2016-06-26 ENCOUNTER — Ambulatory Visit (INDEPENDENT_AMBULATORY_CARE_PROVIDER_SITE_OTHER): Payer: Medicare Other | Admitting: Pharmacist Clinician (PhC)/ Clinical Pharmacy Specialist

## 2016-06-26 DIAGNOSIS — I4892 Unspecified atrial flutter: Secondary | ICD-10-CM | POA: Diagnosis not present

## 2016-06-26 DIAGNOSIS — Z7901 Long term (current) use of anticoagulants: Secondary | ICD-10-CM | POA: Diagnosis not present

## 2016-06-26 LAB — POCT INR: INR: 2.4

## 2016-07-27 ENCOUNTER — Encounter: Payer: Self-pay | Admitting: Cardiovascular Disease

## 2016-07-27 ENCOUNTER — Ambulatory Visit (INDEPENDENT_AMBULATORY_CARE_PROVIDER_SITE_OTHER): Payer: Medicare Other | Admitting: Cardiovascular Disease

## 2016-07-27 ENCOUNTER — Ambulatory Visit (INDEPENDENT_AMBULATORY_CARE_PROVIDER_SITE_OTHER): Payer: Medicare Other | Admitting: Pharmacist Clinician (PhC)/ Clinical Pharmacy Specialist

## 2016-07-27 VITALS — BP 128/82 | HR 102 | Ht 62.0 in | Wt 135.2 lb

## 2016-07-27 DIAGNOSIS — I4892 Unspecified atrial flutter: Secondary | ICD-10-CM | POA: Diagnosis not present

## 2016-07-27 DIAGNOSIS — Z7901 Long term (current) use of anticoagulants: Secondary | ICD-10-CM

## 2016-07-27 DIAGNOSIS — I480 Paroxysmal atrial fibrillation: Secondary | ICD-10-CM | POA: Diagnosis not present

## 2016-07-27 DIAGNOSIS — Z79899 Other long term (current) drug therapy: Secondary | ICD-10-CM

## 2016-07-27 DIAGNOSIS — Z5181 Encounter for therapeutic drug level monitoring: Secondary | ICD-10-CM

## 2016-07-27 DIAGNOSIS — E785 Hyperlipidemia, unspecified: Secondary | ICD-10-CM | POA: Diagnosis not present

## 2016-07-27 DIAGNOSIS — I10 Essential (primary) hypertension: Secondary | ICD-10-CM

## 2016-07-27 DIAGNOSIS — J449 Chronic obstructive pulmonary disease, unspecified: Secondary | ICD-10-CM

## 2016-07-27 LAB — POCT INR: INR: 2.2

## 2016-07-27 NOTE — Progress Notes (Signed)
Patient ID: Daryel November, female   DOB: 27-Dec-1932, 81 y.o.   MRN: [id number]     Cardiology Office Note    Date:  07/27/2016   ID:  Candiss Norse Devaul, DOB 1932/08/29, MRN [id number]  PCP:  Thurman Coyer, MD  Cardiologist:   Sanda Klein, MD   Chief Complaint  Patient presents with  . Follow-up    History of Present Illness:  CHERELL MARKE is a 82 y.o. female with long-standing COPD and a history of paroxysmal atrial flutter and atrial fibrillation. Previous antiarrhythmic therapy with flecainide and amiodarone was not tolerated, she is currently well controlled on sotalol.   She has not had any palpitations that remind her of previous atrial fibrillation. She is in atrial fibrillation today with borderline ventricular rate control and is completely unaware of the arrhythmia. She has not experienced syncope and she denies chest pain. Her shortness of breath is at baseline. She is on warfarin anticoagulation and has not had any bleeding. Ankle edema is a rare occurrence and is always short lived.   Her echocardiogram today shows atrial fibrillation with a corrected QT interval of 414 ms. her last ECG in sinus rhythm showed a QTc interval of 451 ms    Past Medical History:  Diagnosis Date  . Abdominal pain, other specified site    [place] - negative abdominal ultrasound  . Abnormal chest x-ray   . Chest pain 01/19/2009   myoview stress - LVEF >75% no reversible ischemia or infarction  . COPD (chronic obstructive pulmonary disease) ([place])   . Dyslipidemia   . Hypertension   . Paroxysmal atrial flutter ([place]) 01/19/2009   echo - EF 0000000; grade 1 diastolic dysfunction; mitral valve- calcified annlus, mildly dilated R ventricle and atrium  . Pneumonia    hx  . PONV (postoperative nausea and vomiting)   . Tremor, essential 09/07/2015    Past Surgical History:  Procedure Laterality Date  . BREAST LUMPECTOMY WITH NEEDLE LOCALIZATION Right 10/02/2012   Procedure: BREAST LUMPECTOMY WITH  NEEDLE LOCALIZATION;  Surgeon: Harl Bowie, MD;  Location: [facility];  Service: General;  Laterality: Right;  . CATARACT EXTRACTION    . EYE SURGERY    . TONSILLECTOMY    . TONSILLECTOMY    . TUBAL LIGATION      Current Medications: Outpatient Medications Prior to Visit  Medication Sig Dispense Refill  . acetaminophen (TYLENOL) 325 MG tablet Take 650 mg by mouth every 6 (six) hours as needed for pain.    Marland Kitchen albuterol (PROAIR HFA) 108 (90 Base) MCG/ACT inhaler INHALE 2 PUFFS EVERY 6 HOURS AS NEEDED FOR WHEEZING OR SHORTNESS OF BREATH 8.5 g 12  . ALPRAZolam (XANAX) 0.25 MG tablet TAKE 1 TABLET BY MOUTH THREE TIMES DAILY AS NEEDED 90 tablet 5  . amLODipine (NORVASC) 2.5 MG tablet TAKE 1 TABLET BY MOUTH DAILY 30 tablet 10  . aspirin 81 MG tablet Take 81 mg by mouth daily.      . carboxymethylcellulose (REFRESH PLUS) 0.5 % SOLN 1 drop as needed.    Marland Kitchen losartan (COZAAR) 25 MG tablet TAKE 1 TABLET BY MOUTH EVERY DAY 90 tablet 3  . OXYGEN Inhale 2 L into the lungs. Use @@ Bedtime    . Polyvinyl Alcohol-Povidone (REFRESH OP) Apply 1 drop to eye 4 (four) times daily as needed (dry eyes).    . sotalol (BETAPACE) 80 MG tablet TAKE 1 TABLET BY MOUTH TWICE DAILY 60 tablet 6  . tiotropium (SPIRIVA HANDIHALER) 18 MCG  inhalation capsule 1 inhale daily 30 capsule 12  . warfarin (COUMADIN) 1 MG tablet TAKE 1 TO 2 TABLETS BY MOUTH EVERY DAY AS DIRECTED BY COUMADIN CLINIC 135 tablet 1   No facility-administered medications prior to visit.      Allergies:   Augmentin [amoxicillin-pot clavulanate]; Doxycycline; Nitrofurantoin; Amiodarone hcl; Clarithromycin; Clonazepam; Codeine; Diltiazem hcl; Fish oil; Flecainide; Multaq [dronedarone]; Rosuvastatin; and Zetia [ezetimibe]   Social History   Social History  . Marital status: Married    Spouse name: Shanon Brow  . Number of children: 3  . Years of education: 12   Occupational History  . retired    Social History Main Topics  . Smoking status: Former  Smoker    Packs/day: 2.00    Years: 25.00    Types: Cigarettes    Quit date: 08/15/1987  . Smokeless tobacco: Never Used  . Alcohol use No  . Drug use: No  . Sexual activity: Not Asked   Other Topics Concern  . None   Social History Narrative   Patient lives with her husband Shanon Brow).   Patient has 3 children.   Patient is right-handed.   Patient drinks 1 16 oz of Coke daily.   Patient is retired.   Patient has a high school education.     Family History:  The patient's family history includes Alzheimer's disease in her sister; Bone cancer in her father and mother; Breast cancer in her sister; Cancer in her father, mother, and sister; Hyperlipidemia in her brother, father, sister, and sister; Hypertension in her brother, father, sister, and sister; Lung cancer in her father.   ROS:   Please see the history of present illness.    ROS All other systems reviewed and are negative.   PHYSICAL EXAM:   VS:  BP 128/82 (BP Location: Left Arm, Patient Position: Sitting, Cuff Size: Normal)   Pulse (!) 102   Ht 5\' 2"  (1.575 m)   Wt 135 lb 3.2 oz (61.3 kg)   BMI 24.73 kg/m    GEN: Well nourished, well developed, in no acute distress  HEENT: normal  Neck: no JVD, carotid bruits, or masses Cardiac: RRR; no murmurs, rubs, or gallops,no edema  Respiratory:  clear to auscultation bilaterally, normal work of breathing GI: soft, nontender, nondistended, + BS MS: no deformity or atrophy  Skin: warm and dry, no rash Neuro:  Alert and Oriented x 3, Strength and sensation are intact Psych: euthymic mood, full affect  Wt Readings from Last 3 Encounters:  07/27/16 135 lb 3.2 oz (61.3 kg)  01/19/16 137 lb 6.4 oz (62.3 kg)  01/04/16 137 lb 3.2 oz (62.2 kg)      Studies/Labs Reviewed:   EKG:  EKG is ordered today.  The ekg ordered today demonstrates atrial fibrillation, QTC 414 ms  Recent Labs: 01/19/2016: ALT 8; BUN 13; Creat 0.98; Hemoglobin 13.2; Platelets 196; Potassium 4.8; Sodium 142     Lipid Panel    Component Value Date/Time   CHOL 202 (H) 01/22/2014 0819   TRIG 100 01/22/2014 0819   HDL 54 01/22/2014 0819   CHOLHDL 3.7 01/22/2014 0819   VLDL 20 01/22/2014 0819   LDLCALC 128 (H) 01/22/2014 0819     ASSESSMENT:    1. Paroxysmal atrial fibrillation (HCC)   2. Encounter for monitoring sotalol therapy   3. Long term current use of anticoagulant therapy   4. Essential hypertension   5. COPD with asthma ([place])   6. Dyslipidemia   7. Medication management  PLAN:  In order of problems listed above:  1. AFib: Generally well controlled. Unaware of the arrhythmia today and generally asymptomatic. CHADSVasc at least 4 (age 22, gender, HTN) 2. Sotalol: Since she is asymptomatic, I don't see a good reason to increase the dose of sotalol. Reminded her of the risk of multiple drug interactions with QT prolongation while taking sotalol. Needs an electrocardiogram and assessment of electrolytes and renal function based once every 6 months.  3. Warfarin anticoagulation well-tolerated. 4. HTN: Good control. Ankle swelling improved when we reduced the dose of amlodipine. 5. COPD: Uses oxygen supplementation only at night 6. HLP: No known coronary or vascular problems. Need an updated lipid profile.Marland Kitchen She was intolerant of Crestor and Zetia.    Medication Adjustments/Labs and Tests Ordered: Current medicines are reviewed at length with the patient today.  Concerns regarding medicines are outlined above.  Medication changes, Labs and Tests ordered today are listed in the Patient Instructions below. Patient Instructions  Medication Instructions: Dr Sallyanne Kuster recommends that you continue on your current medications as directed. Please refer to the Current Medication list given to you today.  Labwork: Your physician recommends that you return for lab work at your convenience - FASTING.  Testing/Procedures: NONE ORDERED  Follow-up: Dr Sallyanne Kuster recommends that you  schedule a follow-up appointment in 6 months. You will receive a reminder letter in the mail two months in advance. If you don't receive a letter, please call our office to schedule the follow-up appointment.  If you need a refill on your cardiac medications before your next appointment, please call your pharmacy.    Signed, Sanda Klein, MD  07/27/2016 6:43 PM    [facility] [place], [place], [place]  [address] Phone: [phone]; Fax: [phone]

## 2016-07-27 NOTE — Patient Instructions (Signed)
Medication Instructions: Dr Croitoru recommends that you continue on your current medications as directed. Please refer to the Current Medication list given to you today.  Labwork: Your physician recommends that you return for lab work at your convenience - FASTING.  Testing/Procedures: NONE ORDERED  Follow-up: Dr Croitoru recommends that you schedule a follow-up appointment in 6 months. You will receive a reminder letter in the mail two months in advance. If you don't receive a letter, please call our office to schedule the follow-up appointment.  If you need a refill on your cardiac medications before your next appointment, please call your pharmacy. 

## 2016-07-31 DIAGNOSIS — I480 Paroxysmal atrial fibrillation: Secondary | ICD-10-CM | POA: Diagnosis not present

## 2016-07-31 DIAGNOSIS — E785 Hyperlipidemia, unspecified: Secondary | ICD-10-CM | POA: Diagnosis not present

## 2016-07-31 DIAGNOSIS — Z79899 Other long term (current) drug therapy: Secondary | ICD-10-CM | POA: Diagnosis not present

## 2016-07-31 LAB — COMPREHENSIVE METABOLIC PANEL
ALK PHOS: 87 U/L (ref 33–130)
ALT: 10 U/L (ref 6–29)
AST: 20 U/L (ref 10–35)
Albumin: 4 g/dL (ref 3.6–5.1)
BILIRUBIN TOTAL: 0.4 mg/dL (ref 0.2–1.2)
BUN: 10 mg/dL (ref 7–25)
CO2: 32 mmol/L — ABNORMAL HIGH (ref 20–31)
CREATININE: 0.91 mg/dL — AB (ref 0.60–0.88)
Calcium: 9.5 mg/dL (ref 8.6–10.4)
Chloride: 103 mmol/L (ref 98–110)
GLUCOSE: 94 mg/dL (ref 65–99)
POTASSIUM: 5.1 mmol/L (ref 3.5–5.3)
Sodium: 142 mmol/L (ref 135–146)
TOTAL PROTEIN: 5.8 g/dL — AB (ref 6.1–8.1)

## 2016-07-31 LAB — CBC
HCT: 41.4 % (ref 35.0–45.0)
Hemoglobin: 13.2 g/dL (ref 11.7–15.5)
MCH: 28.4 pg (ref 27.0–33.0)
MCHC: 31.9 g/dL — ABNORMAL LOW (ref 32.0–36.0)
MCV: 89.2 fL (ref 80.0–100.0)
MPV: 10.5 fL (ref 7.5–12.5)
PLATELETS: 224 10*3/uL (ref 140–400)
RBC: 4.64 MIL/uL (ref 3.80–5.10)
RDW: 14.3 % (ref 11.0–15.0)
WBC: 7.5 10*3/uL (ref 3.8–10.8)

## 2016-07-31 LAB — LIPID PANEL
CHOL/HDL RATIO: 4 ratio (ref <5.0)
CHOLESTEROL: 224 mg/dL — AB (ref <200)
HDL: 56 mg/dL (ref >50)
LDL Cholesterol: 134 mg/dL — ABNORMAL HIGH (ref <100)
Triglycerides: 168 mg/dL — ABNORMAL HIGH (ref <150)
VLDL: 34 mg/dL — ABNORMAL HIGH (ref <30)

## 2016-09-04 ENCOUNTER — Ambulatory Visit (INDEPENDENT_AMBULATORY_CARE_PROVIDER_SITE_OTHER): Payer: Medicare Other | Admitting: Pharmacist Clinician (PhC)/ Clinical Pharmacy Specialist

## 2016-09-04 ENCOUNTER — Other Ambulatory Visit: Payer: Self-pay | Admitting: Cardiovascular Disease

## 2016-09-04 DIAGNOSIS — I4892 Unspecified atrial flutter: Secondary | ICD-10-CM | POA: Diagnosis not present

## 2016-09-04 DIAGNOSIS — Z7901 Long term (current) use of anticoagulants: Secondary | ICD-10-CM

## 2016-09-04 LAB — POCT INR: INR: 2.5

## 2016-09-05 NOTE — Telephone Encounter (Signed)
REFILL 

## 2016-09-06 ENCOUNTER — Ambulatory Visit (INDEPENDENT_AMBULATORY_CARE_PROVIDER_SITE_OTHER): Payer: Medicare Other | Admitting: Nurse Practitioner

## 2016-09-06 ENCOUNTER — Encounter: Payer: Self-pay | Admitting: Nurse Practitioner

## 2016-09-06 VITALS — BP 158/87 | HR 104 | Ht 62.0 in | Wt 136.2 lb

## 2016-09-06 DIAGNOSIS — G250 Essential tremor: Secondary | ICD-10-CM

## 2016-09-06 NOTE — Progress Notes (Signed)
[facility]  PATIENT: Claudia [name] DOB: 05-Jun-1933   REASON FOR VISIT: Follow-up for essential tremor HISTORY FROM: patient     HISTORY OF PRESENT ILLNESS: Claudia [name] is a 83 year old female returns for follow-up for essential tremor which has progressed slowly over time. She continues to take alprazolam 0.25mg  3 times a day as needed. She remains independent with her activities of daily living, she still writes checks. She occasionally has issues with handwriting. She can feed herself. She also has a history of COPD and her tremor may be exacerbated by her inhalers that she takes for this. She feels there is been no real change in her tremor since last seen. She continues to drive without difficulty. She has not given up any daily activities because of tremor. She returns for reevaluation   REVIEW OF SYSTEMS: Full 14 system review of systems performed and notable only for those listed, all others are neg:  Constitutional: neg  Cardiovascular: neg Ear/Nose/Throat: neg  Skin: neg Eyes: neg Respiratory: COPD, shortness of breath Gastroitestinal: neg  Hematology/Lymphatic: neg  Endocrine: neg Musculoskeletal: Neck stiffness, back pain Allergy/Immunology: neg Neurological: neg Psychiatric: neg Sleep : neg   ALLERGIES: Allergies  Allergen Reactions  . Augmentin [Amoxicillin-Pot Clavulanate] Shortness Of Breath  . Doxycycline Shortness Of Breath  . Nitrofurantoin Shortness Of Breath  . Amiodarone Hcl Nausea And Vomiting  . Clarithromycin Other (See Comments)    Crazy feeling, confusion  . Clonazepam Other (See Comments)    Doesn't remember  . Codeine Nausea And Vomiting  . Diltiazem Hcl Nausea And Vomiting  . Fish Oil Nausea And Vomiting  . Flecainide Nausea Only  . Multaq [Dronedarone] Nausea Only  . Rosuvastatin Other (See Comments)    Leg cramps   . Zetia [Ezetimibe] Other (See Comments)    Leg cramps    HOME MEDICATIONS: Outpatient  Medications Prior to Visit  Medication Sig Dispense Refill  . acetaminophen (TYLENOL) 325 MG tablet Take 650 mg by mouth every 6 (six) hours as needed for pain.    Marland Kitchen albuterol (PROAIR HFA) 108 (90 Base) MCG/ACT inhaler INHALE 2 PUFFS EVERY 6 HOURS AS NEEDED FOR WHEEZING OR SHORTNESS OF BREATH 8.5 g 12  . ALPRAZolam (XANAX) 0.25 MG tablet TAKE 1 TABLET BY MOUTH THREE TIMES DAILY AS NEEDED 90 tablet 5  . amLODipine (NORVASC) 2.5 MG tablet TAKE 1 TABLET BY MOUTH DAILY 30 tablet 10  . aspirin 81 MG tablet Take 81 mg by mouth daily.      . carboxymethylcellulose (REFRESH PLUS) 0.5 % SOLN 1 drop as needed.    Marland Kitchen losartan (COZAAR) 25 MG tablet TAKE 1 TABLET BY MOUTH EVERY DAY 90 tablet 3  . OXYGEN Inhale 2 L into the lungs. Use @@ Bedtime    . Polyvinyl Alcohol-Povidone (REFRESH OP) Apply 1 drop to eye 4 (four) times daily as needed (dry eyes).    . sotalol (BETAPACE) 80 MG tablet TAKE 1 TABLET BY MOUTH TWICE DAILY 60 tablet 5  . tiotropium (SPIRIVA HANDIHALER) 18 MCG inhalation capsule 1 inhale daily 30 capsule 12  . warfarin (COUMADIN) 1 MG tablet TAKE 1 TO 2 TABLETS BY MOUTH EVERY DAY AS DIRECTED BY COUMADIN CLINIC 135 tablet 1   No facility-administered medications prior to visit.     PAST MEDICAL HISTORY: Past Medical History:  Diagnosis Date  . Abdominal pain, other specified site    [place] - negative abdominal ultrasound  . Abnormal chest x-ray   . Chest pain 01/19/2009  myoview stress - LVEF >75% no reversible ischemia or infarction  . COPD (chronic obstructive pulmonary disease) ([place])   . Dyslipidemia   . Hypertension   . Paroxysmal atrial flutter ([place]) 01/19/2009   echo - EF 0000000; grade 1 diastolic dysfunction; mitral valve- calcified annlus, mildly dilated R ventricle and atrium  . Pneumonia    hx  . PONV (postoperative nausea and vomiting)   . Tremor, essential 09/07/2015    PAST SURGICAL HISTORY: Past Surgical History:  Procedure Laterality Date  . BREAST LUMPECTOMY WITH NEEDLE  LOCALIZATION Right 10/02/2012   Procedure: BREAST LUMPECTOMY WITH NEEDLE LOCALIZATION;  Surgeon: Harl Bowie, MD;  Location: [facility];  Service: General;  Laterality: Right;  . CATARACT EXTRACTION    . EYE SURGERY    . TONSILLECTOMY    . TONSILLECTOMY    . TUBAL LIGATION      FAMILY HISTORY: Family History  Problem Relation Age of Onset  . Bone cancer Mother   . Cancer Mother     bone  . Bone cancer Father   . Lung cancer Father   . Cancer Father     Lung  . Hyperlipidemia Father   . Hypertension Father   . Hypertension Sister   . Cancer Sister     breast  . Hyperlipidemia Sister   . Hyperlipidemia Sister   . Alzheimer's disease Sister   . Hypertension Sister   . Hyperlipidemia Brother   . Hypertension Brother   . Breast cancer Sister     SOCIAL HISTORY: Social History   Social History  . Marital status: Married    Spouse name: Shanon Brow  . Number of children: 3  . Years of education: 12   Occupational History  . retired    Social History Main Topics  . Smoking status: Former Smoker    Packs/day: 2.00    Years: 25.00    Types: Cigarettes    Quit date: 08/15/1987  . Smokeless tobacco: Never Used  . Alcohol use No  . Drug use: No  . Sexual activity: Not on file   Other Topics Concern  . Not on file   Social History Narrative   Patient lives with her husband Shanon Brow).   Patient has 3 children.   Patient is right-handed.   Patient drinks 1 16 oz of Coke daily.   Patient is retired.   Patient has a high school education.     PHYSICAL EXAM  Vitals:   09/06/16 0959  BP: (!) 158/87  Pulse: (!) 104  Weight: 136 lb 3.2 oz (61.8 kg)  Height: 5\' 2"  (1.575 m)   Body mass index is 24.91 kg/m.  Generalized: Well developed, in no acute distress  Head: normocephalic and atraumatic,. Oropharynx benign  Musculoskeletal: No deformity   Neurological examination   Mentation: Alert oriented to time, place, history taking. Attention span and concentration  appropriate. Recent and remote memory intact.  Follows all commands speech and language fluent.   Cranial nerve II-XII: Pupils were equal round reactive to light extraocular movements were full, visual field were full on confrontational test. Facial sensation and strength were normal. hearing was intact to finger rubbing bilaterally. Uvula tongue midline. head turning and shoulder shrug were normal and symmetric.Tongue protrusion into cheek strength was normal.Patient has a side to side head tremor which is mild Motor: normal bulk and tone, full strength in the BUE, BLE, intention tremor with finger to nose right slightly worse than left Sensory: normal and symmetric to light  touch, on the face arms and legs Coordination: finger-nose-finger, heel-to-shin bilaterally, no dysmetria Reflexes: Deep tendon reflexes are symmetric , plantar responses were flexor bilaterally. Gait and Station: Rising up from seated position without assistance, normal stance,  moderate stride, good arm swing, smooth turning, able to perform tiptoe, and heel walking without difficulty. Tandem gait is steady. No assistive device  DIAGNOSTIC DATA (LABS, IMAGING, TESTING) - I reviewed patient records, labs, notes, testing and imaging myself where available.  Lab Results  Component Value Date   WBC 7.5 07/31/2016   HGB 13.2 07/31/2016   HCT 41.4 07/31/2016   MCV 89.2 07/31/2016   PLT 224 07/31/2016      Component Value Date/Time   NA 142 07/31/2016 0906   K 5.1 07/31/2016 0906   CL 103 07/31/2016 0906   CO2 32 (H) 07/31/2016 0906   GLUCOSE 94 07/31/2016 0906   BUN 10 07/31/2016 0906   CREATININE 0.91 (H) 07/31/2016 0906   CALCIUM 9.5 07/31/2016 0906   PROT 5.8 (L) 07/31/2016 0906   ALBUMIN 4.0 07/31/2016 0906   AST 20 07/31/2016 0906   ALT 10 07/31/2016 0906   ALKPHOS 87 07/31/2016 0906   BILITOT 0.4 07/31/2016 0906   GFRNONAA 56 (L) 09/30/2012 1422   GFRAA 65 (L) 09/30/2012 1422   Lab Results  Component  Value Date   CHOL 224 (H) 07/31/2016   HDL 56 07/31/2016   LDLCALC 134 (H) 07/31/2016   TRIG 168 (H) 07/31/2016   CHOLHDL 4.0 07/31/2016       ASSESSMENT AND PLAN  83 y.o. year old female  has a past medical history of Tremor, essential (09/07/2015). here To follow-up. She is currently on alprazolam if needed for her tremor which overall is stable  PLAN: Continue Xanax at current dose  Call for worsening of tremor Follow up yearly and prn A total of 15 minutes was spent face-to-face with this patient. Over half this time was spent on counseling patient on the  Essential tremor  diagnosis and different diagnostic and therapeutic options available. She can also try  weighted utensils. Discussed specifically side effects of Xanax as well to include drowsiness and dizziness, dry mouth . Dennie Bible, [facility], [facility], APRN  [facility] [address] [place], [place] [address] [phone]

## 2016-09-06 NOTE — Progress Notes (Signed)
I have read the note, and I agree with the clinical assessment and plan.  Claudia [name]   

## 2016-09-06 NOTE — Patient Instructions (Signed)
Continue Xanax at current dose will refill Call for worsening of tremor Follow up yearly and prn

## 2016-09-09 ENCOUNTER — Inpatient Hospital Stay (HOSPITAL_BASED_OUTPATIENT_CLINIC_OR_DEPARTMENT_OTHER)
Admission: EM | Admit: 2016-09-09 | Discharge: 2016-09-30 | DRG: 242 | Disposition: A | Discharge status: Home Health | Payer: Medicare Other | Attending: Nephrology | Admitting: Nephrology

## 2016-09-09 ENCOUNTER — Emergency Department (HOSPITAL_BASED_OUTPATIENT_CLINIC_OR_DEPARTMENT_OTHER): Payer: Medicare Other

## 2016-09-09 ENCOUNTER — Encounter (HOSPITAL_BASED_OUTPATIENT_CLINIC_OR_DEPARTMENT_OTHER): Payer: Self-pay | Admitting: Emergency Medicine

## 2016-09-09 DIAGNOSIS — B370 Candidal stomatitis: Secondary | ICD-10-CM | POA: Diagnosis not present

## 2016-09-09 DIAGNOSIS — Z87891 Personal history of nicotine dependence: Secondary | ICD-10-CM | POA: Diagnosis not present

## 2016-09-09 DIAGNOSIS — I5032 Chronic diastolic (congestive) heart failure: Secondary | ICD-10-CM | POA: Diagnosis not present

## 2016-09-09 DIAGNOSIS — J9620 Acute and chronic respiratory failure, unspecified whether with hypoxia or hypercapnia: Secondary | ICD-10-CM | POA: Diagnosis present

## 2016-09-09 DIAGNOSIS — J9621 Acute and chronic respiratory failure with hypoxia: Secondary | ICD-10-CM | POA: Diagnosis present

## 2016-09-09 DIAGNOSIS — B002 Herpesviral gingivostomatitis and pharyngotonsillitis: Secondary | ICD-10-CM | POA: Diagnosis not present

## 2016-09-09 DIAGNOSIS — I495 Sick sinus syndrome: Principal | ICD-10-CM | POA: Diagnosis present

## 2016-09-09 DIAGNOSIS — T45515A Adverse effect of anticoagulants, initial encounter: Secondary | ICD-10-CM | POA: Diagnosis not present

## 2016-09-09 DIAGNOSIS — I10 Essential (primary) hypertension: Secondary | ICD-10-CM | POA: Diagnosis not present

## 2016-09-09 DIAGNOSIS — I110 Hypertensive heart disease with heart failure: Secondary | ICD-10-CM | POA: Diagnosis present

## 2016-09-09 DIAGNOSIS — J441 Chronic obstructive pulmonary disease with (acute) exacerbation: Secondary | ICD-10-CM | POA: Diagnosis not present

## 2016-09-09 DIAGNOSIS — R0600 Dyspnea, unspecified: Secondary | ICD-10-CM | POA: Diagnosis not present

## 2016-09-09 DIAGNOSIS — R791 Abnormal coagulation profile: Secondary | ICD-10-CM | POA: Diagnosis not present

## 2016-09-09 DIAGNOSIS — R0602 Shortness of breath: Secondary | ICD-10-CM

## 2016-09-09 DIAGNOSIS — I4892 Unspecified atrial flutter: Secondary | ICD-10-CM | POA: Diagnosis present

## 2016-09-09 DIAGNOSIS — Z66 Do not resuscitate: Secondary | ICD-10-CM | POA: Diagnosis present

## 2016-09-09 DIAGNOSIS — Z7901 Long term (current) use of anticoagulants: Secondary | ICD-10-CM

## 2016-09-09 DIAGNOSIS — I482 Chronic atrial fibrillation, unspecified: Secondary | ICD-10-CM | POA: Diagnosis present

## 2016-09-09 DIAGNOSIS — E785 Hyperlipidemia, unspecified: Secondary | ICD-10-CM | POA: Diagnosis present

## 2016-09-09 DIAGNOSIS — J9601 Acute respiratory failure with hypoxia: Secondary | ICD-10-CM

## 2016-09-09 DIAGNOSIS — R0902 Hypoxemia: Secondary | ICD-10-CM | POA: Diagnosis not present

## 2016-09-09 DIAGNOSIS — N179 Acute kidney failure, unspecified: Secondary | ICD-10-CM | POA: Diagnosis not present

## 2016-09-09 DIAGNOSIS — I700 Atherosclerosis of aorta: Secondary | ICD-10-CM | POA: Diagnosis present

## 2016-09-09 DIAGNOSIS — Z95818 Presence of other cardiac implants and grafts: Secondary | ICD-10-CM

## 2016-09-09 DIAGNOSIS — R339 Retention of urine, unspecified: Secondary | ICD-10-CM | POA: Diagnosis not present

## 2016-09-09 DIAGNOSIS — R410 Disorientation, unspecified: Secondary | ICD-10-CM | POA: Diagnosis not present

## 2016-09-09 DIAGNOSIS — I481 Persistent atrial fibrillation: Secondary | ICD-10-CM | POA: Diagnosis not present

## 2016-09-09 DIAGNOSIS — I480 Paroxysmal atrial fibrillation: Secondary | ICD-10-CM | POA: Diagnosis present

## 2016-09-09 DIAGNOSIS — E874 Mixed disorder of acid-base balance: Secondary | ICD-10-CM | POA: Diagnosis not present

## 2016-09-09 DIAGNOSIS — Z938 Other artificial opening status: Secondary | ICD-10-CM

## 2016-09-09 DIAGNOSIS — R0603 Acute respiratory distress: Secondary | ICD-10-CM | POA: Diagnosis not present

## 2016-09-09 DIAGNOSIS — R05 Cough: Secondary | ICD-10-CM | POA: Diagnosis not present

## 2016-09-09 DIAGNOSIS — J811 Chronic pulmonary edema: Secondary | ICD-10-CM

## 2016-09-09 DIAGNOSIS — E875 Hyperkalemia: Secondary | ICD-10-CM | POA: Diagnosis not present

## 2016-09-09 DIAGNOSIS — S270XXA Traumatic pneumothorax, initial encounter: Secondary | ICD-10-CM | POA: Diagnosis present

## 2016-09-09 DIAGNOSIS — J9622 Acute and chronic respiratory failure with hypercapnia: Secondary | ICD-10-CM | POA: Diagnosis present

## 2016-09-09 DIAGNOSIS — D72829 Elevated white blood cell count, unspecified: Secondary | ICD-10-CM | POA: Diagnosis not present

## 2016-09-09 DIAGNOSIS — J939 Pneumothorax, unspecified: Secondary | ICD-10-CM

## 2016-09-09 DIAGNOSIS — T380X5A Adverse effect of glucocorticoids and synthetic analogues, initial encounter: Secondary | ICD-10-CM | POA: Diagnosis not present

## 2016-09-09 DIAGNOSIS — Z950 Presence of cardiac pacemaker: Secondary | ICD-10-CM

## 2016-09-09 DIAGNOSIS — Z79899 Other long term (current) drug therapy: Secondary | ICD-10-CM

## 2016-09-09 LAB — COMPREHENSIVE METABOLIC PANEL
ALBUMIN: 4 g/dL (ref 3.5–5.0)
ALK PHOS: 78 U/L (ref 38–126)
ALT: 14 U/L (ref 14–54)
AST: 23 U/L (ref 15–41)
Anion gap: 5 (ref 5–15)
BUN: 11 mg/dL (ref 6–20)
CO2: 30 mmol/L (ref 22–32)
CREATININE: 0.98 mg/dL (ref 0.44–1.00)
Calcium: 9.1 mg/dL (ref 8.9–10.3)
Chloride: 102 mmol/L (ref 101–111)
GFR calc non Af Amer: 53 mL/min — ABNORMAL LOW (ref >60)
GLUCOSE: 103 mg/dL — AB (ref 65–99)
Potassium: 4.2 mmol/L (ref 3.5–5.1)
SODIUM: 137 mmol/L (ref 135–145)
Total Bilirubin: 0.8 mg/dL (ref 0.3–1.2)
Total Protein: 6.3 g/dL — ABNORMAL LOW (ref 6.5–8.1)

## 2016-09-09 LAB — CBC WITH DIFFERENTIAL/PLATELET
BASOS PCT: 1 %
Basophils Absolute: 0.1 10*3/uL (ref 0.0–0.1)
EOS ABS: 0 10*3/uL (ref 0.0–0.7)
EOS PCT: 1 %
HCT: 40.4 % (ref 36.0–46.0)
Hemoglobin: 12.6 g/dL (ref 12.0–15.0)
Lymphocytes Relative: 15 %
Lymphs Abs: 0.8 10*3/uL (ref 0.7–4.0)
MCH: 28.4 pg (ref 26.0–34.0)
MCHC: 31.2 g/dL (ref 30.0–36.0)
MCV: 91.2 fL (ref 78.0–100.0)
MONO ABS: 0.7 10*3/uL (ref 0.1–1.0)
MONOS PCT: 13 %
Neutro Abs: 3.8 10*3/uL (ref 1.7–7.7)
Neutrophils Relative %: 70 %
Platelets: 166 10*3/uL (ref 150–400)
RBC: 4.43 MIL/uL (ref 3.87–5.11)
RDW: 13.9 % (ref 11.5–15.5)
WBC: 5.3 10*3/uL (ref 4.0–10.5)

## 2016-09-09 LAB — URINALYSIS, ROUTINE W REFLEX MICROSCOPIC
Bilirubin Urine: NEGATIVE
GLUCOSE, UA: NEGATIVE mg/dL
HGB URINE DIPSTICK: NEGATIVE
KETONES UR: NEGATIVE mg/dL
Nitrite: NEGATIVE
PROTEIN: NEGATIVE mg/dL
Specific Gravity, Urine: 1.007 (ref 1.005–1.030)
pH: 7.5 (ref 5.0–8.0)

## 2016-09-09 LAB — URINALYSIS, MICROSCOPIC (REFLEX)

## 2016-09-09 LAB — TROPONIN I

## 2016-09-09 LAB — BRAIN NATRIURETIC PEPTIDE: B NATRIURETIC PEPTIDE 5: 257.1 pg/mL — AB (ref 0.0–100.0)

## 2016-09-09 MED ORDER — SODIUM CHLORIDE 0.9% FLUSH
3.0000 mL | Freq: Two times a day (BID) | INTRAVENOUS | Status: DC
  Administered 2016-09-09 – 2016-09-18 (×8): 3 mL via INTRAVENOUS
  Administered 2016-09-18: 10 mL via INTRAVENOUS
  Administered 2016-09-19 – 2016-09-30 (×22): 3 mL via INTRAVENOUS

## 2016-09-09 MED ORDER — WARFARIN - PHARMACIST DOSING INPATIENT
Freq: Every day | Status: DC
  Administered 2016-09-11 – 2016-09-26 (×8)

## 2016-09-09 MED ORDER — ALBUTEROL SULFATE (2.5 MG/3ML) 0.083% IN NEBU
5.0000 mg | INHALATION_SOLUTION | Freq: Once | RESPIRATORY_TRACT | Status: AC
  Administered 2016-09-09: 5 mg via RESPIRATORY_TRACT
  Filled 2016-09-09: qty 6

## 2016-09-09 MED ORDER — SOTALOL HCL 80 MG PO TABS
80.0000 mg | ORAL_TABLET | Freq: Two times a day (BID) | ORAL | Status: DC
  Administered 2016-09-09 – 2016-09-11 (×5): 80 mg via ORAL
  Filled 2016-09-09 (×6): qty 1

## 2016-09-09 MED ORDER — HYPROMELLOSE (GONIOSCOPIC) 2.5 % OP SOLN
1.0000 [drp] | Freq: Three times a day (TID) | OPHTHALMIC | Status: DC
  Filled 2016-09-09: qty 15

## 2016-09-09 MED ORDER — AMLODIPINE BESYLATE 2.5 MG PO TABS
2.5000 mg | ORAL_TABLET | Freq: Every day | ORAL | Status: DC
  Administered 2016-09-10 – 2016-09-11 (×2): 2.5 mg via ORAL
  Filled 2016-09-09 (×2): qty 1

## 2016-09-09 MED ORDER — SODIUM CHLORIDE 0.9 % IV SOLN
250.0000 mL | INTRAVENOUS | Status: DC | PRN
  Administered 2016-09-15: 250 mL via INTRAVENOUS

## 2016-09-09 MED ORDER — SODIUM CHLORIDE 0.9% FLUSH
3.0000 mL | Freq: Two times a day (BID) | INTRAVENOUS | Status: DC
  Administered 2016-09-10 – 2016-09-14 (×5): 3 mL via INTRAVENOUS

## 2016-09-09 MED ORDER — ACETAMINOPHEN 325 MG PO TABS
650.0000 mg | ORAL_TABLET | Freq: Four times a day (QID) | ORAL | Status: DC | PRN
  Administered 2016-09-10 – 2016-09-17 (×6): 650 mg via ORAL
  Filled 2016-09-09 (×7): qty 2

## 2016-09-09 MED ORDER — ASPIRIN 81 MG PO TABS: 81.0000 mg | ORAL_TABLET | Freq: Every day | ORAL | Status: DC

## 2016-09-09 MED ORDER — ORAL CARE MOUTH RINSE
15.0000 mL | Freq: Two times a day (BID) | OROMUCOSAL | Status: DC
  Administered 2016-09-11 – 2016-09-12 (×2): 15 mL via OROMUCOSAL

## 2016-09-09 MED ORDER — ONDANSETRON HCL 4 MG PO TABS: 4.0000 mg | ORAL_TABLET | Freq: Four times a day (QID) | ORAL | Status: DC | PRN

## 2016-09-09 MED ORDER — LOSARTAN POTASSIUM 25 MG PO TABS
25.0000 mg | ORAL_TABLET | Freq: Every day | ORAL | Status: DC
  Administered 2016-09-10 – 2016-09-11 (×2): 25 mg via ORAL
  Filled 2016-09-09 (×2): qty 1

## 2016-09-09 MED ORDER — ALBUTEROL SULFATE (2.5 MG/3ML) 0.083% IN NEBU
2.5000 mg | INHALATION_SOLUTION | Freq: Once | RESPIRATORY_TRACT | Status: AC
  Administered 2016-09-09: 2.5 mg via RESPIRATORY_TRACT
  Filled 2016-09-09: qty 3

## 2016-09-09 MED ORDER — ALPRAZOLAM 0.25 MG PO TABS
0.2500 mg | ORAL_TABLET | Freq: Three times a day (TID) | ORAL | Status: DC | PRN
  Administered 2016-09-09 – 2016-09-29 (×21): 0.25 mg via ORAL
  Filled 2016-09-09 (×22): qty 1

## 2016-09-09 MED ORDER — LEVOFLOXACIN 500 MG PO TABS
500.0000 mg | ORAL_TABLET | Freq: Once | ORAL | Status: AC
  Administered 2016-09-09: 500 mg via ORAL
  Filled 2016-09-09: qty 1

## 2016-09-09 MED ORDER — CARBOXYMETHYLCELLULOSE SODIUM 0.5 % OP SOLN: 1.0000 [drp] | Freq: Three times a day (TID) | OPHTHALMIC | Status: DC | PRN

## 2016-09-09 MED ORDER — HYPROMELLOSE (GONIOSCOPIC) 2.5 % OP SOLN
1.0000 [drp] | Freq: Three times a day (TID) | OPHTHALMIC | Status: DC | PRN
  Filled 2016-09-09: qty 15

## 2016-09-09 MED ORDER — IPRATROPIUM BROMIDE 0.02 % IN SOLN
0.5000 mg | Freq: Once | RESPIRATORY_TRACT | Status: AC
  Administered 2016-09-09: 0.5 mg via RESPIRATORY_TRACT
  Filled 2016-09-09: qty 2.5

## 2016-09-09 MED ORDER — WARFARIN SODIUM 1 MG PO TABS
1.0000 mg | ORAL_TABLET | Freq: Once | ORAL | Status: AC
  Administered 2016-09-09: 1 mg via ORAL
  Filled 2016-09-09: qty 1
  Filled 2016-09-09: qty 0.5

## 2016-09-09 MED ORDER — ASPIRIN EC 81 MG PO TBEC
81.0000 mg | DELAYED_RELEASE_TABLET | Freq: Every day | ORAL | Status: DC
  Administered 2016-09-10 – 2016-09-24 (×16): 81 mg via ORAL
  Filled 2016-09-09 (×16): qty 1

## 2016-09-09 MED ORDER — PREDNISONE 20 MG PO TABS
40.0000 mg | ORAL_TABLET | Freq: Once | ORAL | Status: AC
  Administered 2016-09-09: 40 mg via ORAL
  Filled 2016-09-09: qty 2

## 2016-09-09 MED ORDER — IPRATROPIUM-ALBUTEROL 0.5-2.5 (3) MG/3ML IN SOLN
3.0000 mL | RESPIRATORY_TRACT | Status: DC | PRN
  Administered 2016-09-10 – 2016-09-11 (×4): 3 mL via RESPIRATORY_TRACT
  Filled 2016-09-09 (×5): qty 3

## 2016-09-09 MED ORDER — SODIUM CHLORIDE 0.9% FLUSH: 3.0000 mL | INTRAVENOUS | Status: DC | PRN

## 2016-09-09 MED ORDER — ONDANSETRON HCL 4 MG/2ML IJ SOLN
4.0000 mg | Freq: Four times a day (QID) | INTRAMUSCULAR | Status: DC | PRN
  Administered 2016-09-12 – 2016-09-13 (×4): 4 mg via INTRAVENOUS
  Filled 2016-09-09 (×4): qty 2

## 2016-09-09 MED ORDER — HYDROCODONE-ACETAMINOPHEN 5-325 MG PO TABS
1.0000 | ORAL_TABLET | ORAL | Status: DC | PRN
  Administered 2016-09-18: 1 via ORAL
  Filled 2016-09-09: qty 1

## 2016-09-09 MED ORDER — POLYETHYLENE GLYCOL 3350 17 G PO PACK: 17.0000 g | PACK | Freq: Every day | ORAL | Status: DC | PRN

## 2016-09-09 MED ORDER — POLYVINYL ALCOHOL 1.4 % OP SOLN
1.0000 [drp] | Freq: Three times a day (TID) | OPHTHALMIC | Status: DC
  Administered 2016-09-09 – 2016-09-30 (×48): 1 [drp] via OPHTHALMIC
  Filled 2016-09-09: qty 15

## 2016-09-09 MED ORDER — IPRATROPIUM-ALBUTEROL 0.5-2.5 (3) MG/3ML IN SOLN
3.0000 mL | Freq: Once | RESPIRATORY_TRACT | Status: AC
  Administered 2016-09-09: 3 mL via RESPIRATORY_TRACT
  Filled 2016-09-09: qty 3

## 2016-09-09 MED ORDER — PREDNISONE 20 MG PO TABS
40.0000 mg | ORAL_TABLET | Freq: Every day | ORAL | Status: DC
  Administered 2016-09-10: 40 mg via ORAL
  Filled 2016-09-09: qty 2

## 2016-09-09 NOTE — ED Notes (Signed)
Family at bedside. 

## 2016-09-09 NOTE — ED Triage Notes (Signed)
Pt reports cough for 1 week, mild, nonproductive.  Pt states last night she became SOB but felt improvement with at home O2 at night.  Pt normally sleeps with supplemental O2.

## 2016-09-09 NOTE — H&P (Signed)
History and Physical    DELAYSIA PAUP [id number] DOB: 17-Nov-1932 DOA: 09/09/2016  PCP: Pcp Not In System   Patient coming from: Home, by way of [facility]  Chief Complaint: SOB, dry cough  HPI: Claudia [name] is a 83 y.o. female with medical history significant for paroxysmal atrial fibrillation on warfarin, COPD with chronic hypoxic respiratory failure, chronic diastolic CHF, and hypertension who presents in transfer from [facility] where she presented with 2 weeks of dry cough and dyspnea. Patient reports that she had been in her usual state of health until the insidious development of a dry cough and mild exertional dyspnea approximately 2 weeks ago. These symptoms have been intermittent since that time, but have become more consistent over the past couple days. She had dyspnea with minimal exertion yesterday, then after a coughing fit last night, had difficulty catching her breath and has experienced persistent shortness of breath while at rest since that time. Patient denies any recent fevers or chills, rhinorrhea, sore throat, or headache. She denies any chest pain or palpitations, and also denies lower extremity swelling or orthopnea. Patient denies sick contacts, recent long distance travel, or recent antibiotic use.   [facility] ED Course: Upon arrival to the [facility] ED, patient is found to be afebrile, saturating only 76% on room air, and with vitals otherwise stable. EKG features a sinus rhythm with PVCs and nonspecific ST abnormality in the inferior leads which is similar to that seen on prior EKG. Chest x-ray is notable for chronic COPD changes, but no acute cardiopulmonary disease. Chemistry panel and CBC are unremarkable, troponin is undetectable, and BNP is mildly elevated to 257. Patient was treated with duo nebs, Levaquin, and 40 mg prednisone in the emergency department. She reported some subjective improvement with these measures, but remained dyspneic and hypoxic and will be  admitted to the telemetry unit for ongoing evaluation and management of acute exacerbation and COPD with acute on chronic hypoxic respiratory failure.  Review of Systems:  All other systems reviewed and apart from HPI, are negative.  Past Medical History:  Diagnosis Date  . Abdominal pain, other specified site    [place] - negative abdominal ultrasound  . Abnormal chest x-ray   . Chest pain 01/19/2009   myoview stress - LVEF >75% no reversible ischemia or infarction  . COPD (chronic obstructive pulmonary disease) ([place])   . Dyslipidemia   . Hypertension   . Paroxysmal atrial flutter ([place]) 01/19/2009   echo - EF 0000000; grade 1 diastolic dysfunction; mitral valve- calcified annlus, mildly dilated R ventricle and atrium  . Pneumonia    hx  . PONV (postoperative nausea and vomiting)   . Tremor, essential 09/07/2015    Past Surgical History:  Procedure Laterality Date  . BREAST LUMPECTOMY WITH NEEDLE LOCALIZATION Right 10/02/2012   Procedure: BREAST LUMPECTOMY WITH NEEDLE LOCALIZATION;  Surgeon: Harl Bowie, MD;  Location: [place];  Service: General;  Laterality: Right;  . CATARACT EXTRACTION    . EYE SURGERY    . TONSILLECTOMY    . TONSILLECTOMY    . TUBAL LIGATION       reports that she quit smoking about 29 years ago. Her smoking use included Cigarettes. She has a 50.00 pack-year smoking history. She has never used smokeless tobacco. She reports that she does not drink alcohol or use drugs.  Allergies  Allergen Reactions  . Augmentin [Amoxicillin-Pot Clavulanate] Shortness Of Breath  . Doxycycline Shortness Of Breath  . Nitrofurantoin Shortness Of  Breath  . Amiodarone Hcl Nausea And Vomiting  . Clarithromycin Other (See Comments)    Crazy feeling, confusion  . Clonazepam Other (See Comments)    Doesn't remember  . Codeine Nausea And Vomiting  . Diltiazem Hcl Nausea And Vomiting  . Fish Oil Nausea And Vomiting  . Flecainide Nausea Only  . Multaq [Dronedarone] Nausea Only  .  Rosuvastatin Other (See Comments)    Leg cramps   . Zetia [Ezetimibe] Other (See Comments)    Leg cramps    Family History  Problem Relation Age of Onset  . Bone cancer Mother   . Cancer Mother     bone  . Bone cancer Father   . Lung cancer Father   . Cancer Father     Lung  . Hyperlipidemia Father   . Hypertension Father   . Hypertension Sister   . Cancer Sister     breast  . Hyperlipidemia Sister   . Hyperlipidemia Sister   . Alzheimer's disease Sister   . Hypertension Sister   . Hyperlipidemia Brother   . Hypertension Brother   . Breast cancer Sister      Prior to Admission medications   Medication Sig Start Date End Date Taking? Authorizing Provider  acetaminophen (TYLENOL) 325 MG tablet Take 650 mg by mouth every 6 (six) hours as needed for pain.    Historical Provider, MD  albuterol (PROAIR HFA) 108 (90 Base) MCG/ACT inhaler INHALE 2 PUFFS EVERY 6 HOURS AS NEEDED FOR WHEEZING OR SHORTNESS OF BREATH 01/04/16   Deneise Lever, MD  ALPRAZolam Duanne Moron) 0.25 MG tablet TAKE 1 TABLET BY MOUTH THREE TIMES DAILY AS NEEDED 06/12/16   Kathrynn Ducking, MD  amLODipine (NORVASC) 2.5 MG tablet TAKE 1 TABLET BY MOUTH DAILY 01/04/16   Mihai Croitoru, MD  aspirin 81 MG tablet Take 81 mg by mouth daily.      Historical Provider, MD  carboxymethylcellulose (REFRESH PLUS) 0.5 % SOLN 1 drop as needed.    Historical Provider, MD  losartan (COZAAR) 25 MG tablet TAKE 1 TABLET BY MOUTH EVERY DAY 04/03/16   Mihai Croitoru, MD  OXYGEN Inhale 2 L into the lungs. Use @@ Bedtime    Historical Provider, MD  Polyvinyl Alcohol-Povidone (REFRESH OP) Apply 1 drop to eye 4 (four) times daily as needed (dry eyes).    Historical Provider, MD  sotalol (BETAPACE) 80 MG tablet TAKE 1 TABLET BY MOUTH TWICE DAILY 09/05/16   Sanda Klein, MD  tiotropium (SPIRIVA HANDIHALER) 18 MCG inhalation capsule 1 inhale daily 01/04/16   Deneise Lever, MD  warfarin (COUMADIN) 1 MG tablet TAKE 1 TO 2 TABLETS BY MOUTH EVERY  DAY AS DIRECTED BY COUMADIN CLINIC 04/11/16   Sanda Klein, MD    Physical Exam: Vitals:   09/09/16 1920 09/09/16 1941 09/09/16 1942 09/09/16 2106  BP:  142/74 142/74 (!) 134/102  Pulse: 83 82 82 100  Resp: 25 22 23 20   Temp:   98.1 F (36.7 C) 97.8 F (36.6 C)  TempSrc:   Oral Oral  SpO2: 96% 97% 97% 97%  Weight:    60.5 kg (133 lb 4.8 oz)  Height:    5\' 2"  (1.575 m)      Constitutional: No acute distress, calm, comfortable Eyes: PERTLA, lids and conjunctivae normal ENMT: Mucous membranes are moist. Posterior pharynx clear of any exudate or lesions.   Neck: normal, supple, no masses, no thyromegaly Respiratory: No rhonchi or crackles appreciated. Expiratory wheezing bilaterally. Normal respiratory effort.  Cardiovascular: S1 & S2 heard, regular rate and rhythm, soft systolic murmur at apex. No extremity edema. No significant JVD. Abdomen: No distension, no tenderness, no masses palpated. Bowel sounds normal.  Musculoskeletal: no clubbing / cyanosis. No joint deformity upper and lower extremities. Normal muscle tone.  Skin: no significant rashes, lesions, ulcers. Warm, dry, well-perfused. Neurologic: CN 2-12 grossly intact. Sensation intact, DTR normal. Strength 5/5 in all 4 limbs.  Psychiatric: Normal judgment and insight. Alert and oriented x 3. Normal mood and affect.     Labs on Admission: I have personally reviewed following labs and imaging studies  CBC:  Recent Labs Lab 09/09/16 0934  WBC 5.3  NEUTROABS 3.8  HGB 12.6  HCT 40.4  MCV 91.2  PLT [id number]   Basic Metabolic Panel:  Recent Labs Lab 09/09/16 0934  NA 137  K 4.2  CL 102  CO2 30  GLUCOSE 103*  BUN 11  CREATININE 0.98  CALCIUM 9.1   GFR: Estimated Creatinine Clearance: 38.6 mL/min (by C-G formula based on SCr of 0.98 mg/dL). Liver Function Tests:  Recent Labs Lab 09/09/16 0934  AST 23  ALT 14  ALKPHOS 78  BILITOT 0.8  PROT 6.3*  ALBUMIN 4.0   No results for input(s): LIPASE, AMYLASE  in the last 168 hours. No results for input(s): AMMONIA in the last 168 hours. Coagulation Profile:  Recent Labs Lab 09/04/16 0936  INR 2.5   Cardiac Enzymes:  Recent Labs Lab 09/09/16 0934  TROPONINI <0.03   BNP (last 3 results) No results for input(s): PROBNP in the last 8760 hours. HbA1C: No results for input(s): HGBA1C in the last 72 hours. CBG: No results for input(s): GLUCAP in the last 168 hours. Lipid Profile: No results for input(s): CHOL, HDL, LDLCALC, TRIG, CHOLHDL, LDLDIRECT in the last 72 hours. Thyroid Function Tests: No results for input(s): TSH, T4TOTAL, FREET4, T3FREE, THYROIDAB in the last 72 hours. Anemia Panel: No results for input(s): VITAMINB12, FOLATE, FERRITIN, TIBC, IRON, RETICCTPCT in the last 72 hours. Urine analysis:    Component Value Date/Time   COLORURINE YELLOW 09/09/2016 1050   APPEARANCEUR CLEAR 09/09/2016 1050   LABSPEC 1.007 09/09/2016 1050   PHURINE 7.5 09/09/2016 1050   GLUCOSEU NEGATIVE 09/09/2016 1050   HGBUR NEGATIVE 09/09/2016 1050   BILIRUBINUR NEGATIVE 09/09/2016 1050   KETONESUR NEGATIVE 09/09/2016 1050   PROTEINUR NEGATIVE 09/09/2016 1050   UROBILINOGEN 0.2 01/17/2009 1750   NITRITE NEGATIVE 09/09/2016 1050   LEUKOCYTESUR TRACE (A) 09/09/2016 1050   Sepsis Labs: @LABRCNTIP (procalcitonin:4,lacticidven:4) )No results found for this or any previous visit (from the past 240 hour(s)).   Radiological Exams on Admission: Dg Chest 2 View  Result Date: 09/09/2016 CLINICAL DATA:  Cough for 1 week, shortness of Breath EXAM: CHEST  2 VIEW COMPARISON:  01/31/2016 FINDINGS: There is hyperinflation of the lungs compatible with COPD. Scarring in the lingula and right upper lobe. Heart is upper limits normal in size. No acute airspace opacities or effusions. No acute bony abnormality. IMPRESSION: COPD/chronic changes.  No active disease. Electronically Signed   By: Rolm Baptise M.D.   On: 09/09/2016 09:29    EKG: Independently  reviewed. Sinus rhythm, PVC's, non-specific ST abnormality in inferior leads not significantly changed from prior.   Assessment/Plan  1. COPD with acute exacerbation  - Presents with progressive cough and dyspnea  - Hypoxic to 76% on room air with exertion, 88% while at rest  - CXR is clear and there is no evidence for an infectious etiology  -  Continue DuoNebs, prednisone 40 mg qD, supplemental O2    2. Chronic diastolic CHF  - Appears euvolemic on presentation; BNP noted to be mildly elevated, but no JVD, peripheral edema, gallop, or crackles  - TTE (01/19/09) with EF 0000000, grade 1 diastolic dysfunction, and mild MR  - Has not required diuretics at home; will continue losartan and follow I/O's and daily wts   3. Paroxysmal atrial fibrillation  - In a sinus rhythm on presentation  - CHADS-VASc is 2 (age x2, gender, HTN, CHF)  - Continue AC with warfarin - Continue sotalol    4. Hypertension  - BP mildly elevated on admission  - Plan to continue Norvasc and losartan - Hydralazine IVP's available prn     DVT prophylaxis: warfarin  Code Status: Full  Family Communication: Discussed with patient Disposition Plan: Observe on telemetry Consults called: None Admission status: Observation    Vianne Bulls, MD [facility] Pager [phone]  If 7PM-7AM, please contact night-coverage [email or web address] Password [facility]  09/09/2016, 9:55 PM

## 2016-09-09 NOTE — ED Notes (Signed)
Patient ambulated in hall, HR 77-82, SpO2 93% and dropped to 80% during walk.  Returned to room HR 77, RR24, + DOE  SpO2 77% placed on 2l/m [place].

## 2016-09-09 NOTE — ED Notes (Signed)
Pt family was offered drink and snack but declined

## 2016-09-09 NOTE — Progress Notes (Signed)
New Admission Note:   Arrival Method: Bed Mental Orientation: Pt AO x4 Telemetry:3E05 Assessment: Completed Skin: Intact IV: Right arm  Pain:0/10 Tubes: None Safety Measures: Safety Fall Prevention Plan has been discussed  Admission:  [facility] [place] Orientation: Patient has been orientated to the room, unit and staff.  Family: none at bedside  Orders to be reviewed and implemented. Will continue to monitor the patient. Call light has been placed within reach and bed alarm has been activated.  Pt has some home medications with her. Refused to send medications to the pharmacy.Educated prior why we need to sent them and explained that they will be returned prior discharge. She will send the home with husband tomorrow.  Venetia Night, RN Phone: [phone]

## 2016-09-09 NOTE — ED Notes (Signed)
Pt up with minimal assistance to bedside commode in ED. Pt was was able to wash up due to some incontinence from earlier today completed by herself with stand-by assistance.

## 2016-09-09 NOTE — ED Notes (Signed)
Family at bedside. Son in law at the bedside

## 2016-09-09 NOTE — ED Provider Notes (Signed)
[place] DEPT [facility] Provider Note   CSN: [id number] Arrival date & time: 09/09/16  [id number]     History   Chief Complaint Chief Complaint  Patient presents with  . Shortness of Breath  . Cough    HPI Claudia [name] is a 83 y.o. female.  83 year old female with a past medical history of hypertension, atrial fibrillation, COPD and hyperlipidemia presents to the emergency department today with worsening shortness of breath. Patient states she's had a dry cough intermittently for the last week or 2. However last night she had progressive worsening of her cough with multiple fits of coughing as well. She felt very short of breath during these times. This was relieved by wearing her oxygen. She is only supposed to on oxygen at night however yesterday she was running during the day to make her feel better. She is 90 fevers, muscle aches, bodyaches. She does have some central chest pain associated with the coughing. Does not have any lateral chest pain no nausea, vomiting, diaphoresis or syncope. No radiation of the pain. No significant lower extremity swelling. No other modifying factors. No other associated symptoms. Does have a history of pneumonia and states this feels slightly similar. Has not tried anything for the symptoms at home besides oxygen.      Past Medical History:  Diagnosis Date  . Abdominal pain, other specified site    [place] - negative abdominal ultrasound  . Abnormal chest x-ray   . Chest pain 01/19/2009   myoview stress - LVEF >75% no reversible ischemia or infarction  . COPD (chronic obstructive pulmonary disease) ([place])   . Dyslipidemia   . Hypertension   . Paroxysmal atrial flutter ([place]) 01/19/2009   echo - EF 0000000; grade 1 diastolic dysfunction; mitral valve- calcified annlus, mildly dilated R ventricle and atrium  . Pneumonia    hx  . PONV (postoperative nausea and vomiting)   . Tremor, essential 09/07/2015    Patient Active Problem List   Diagnosis Date  Noted  . COPD exacerbation ([place]) 09/09/2016  . Tremor, essential 09/07/2015  . Chronic respiratory failure ([place]) 07/06/2015  . Essential and other specified forms of tremor 06/17/2013  . Disturbance of skin sensation 06/17/2013  . Paroxysmal atrial fibrillation ([place]) 02/01/2013  . HTN (hypertension) 02/01/2013  . Hyperlipidemia 02/01/2013  . Long term current use of anticoagulant therapy 10/30/2012  . Ductal hyperplasia, atypical, breast 09/24/2012  . Dysfunction of eustachian tube 10/16/2011  . DYSPNEA 05/30/2010  . RHINITIS 05/23/2010  . ATRIAL FLUTTER, PAROXYSMAL 05/24/2007  . COPD with asthma ([place]) 05/24/2007  . Nonspecific (abnormal) findings on radiological and other examination of body structure 05/24/2007    Past Surgical History:  Procedure Laterality Date  . BREAST LUMPECTOMY WITH NEEDLE LOCALIZATION Right 10/02/2012   Procedure: BREAST LUMPECTOMY WITH NEEDLE LOCALIZATION;  Surgeon: Harl Bowie, MD;  Location: [place];  Service: General;  Laterality: Right;  . CATARACT EXTRACTION    . EYE SURGERY    . TONSILLECTOMY    . TONSILLECTOMY    . TUBAL LIGATION      OB History    No data available       Home Medications    Prior to Admission medications   Medication Sig Start Date End Date Taking? Authorizing Provider  acetaminophen (TYLENOL) 325 MG tablet Take 650 mg by mouth every 6 (six) hours as needed for pain.    Historical Provider, MD  albuterol (PROAIR HFA) 108 (90 Base) MCG/ACT inhaler INHALE 2 PUFFS EVERY  6 HOURS AS NEEDED FOR WHEEZING OR SHORTNESS OF BREATH 01/04/16   Deneise Lever, MD  ALPRAZolam Duanne Moron) 0.25 MG tablet TAKE 1 TABLET BY MOUTH THREE TIMES DAILY AS NEEDED 06/12/16   Kathrynn Ducking, MD  amLODipine (NORVASC) 2.5 MG tablet TAKE 1 TABLET BY MOUTH DAILY 01/04/16   Mihai Croitoru, MD  aspirin 81 MG tablet Take 81 mg by mouth daily.      Historical Provider, MD  carboxymethylcellulose (REFRESH PLUS) 0.5 % SOLN 1 drop as needed.    Historical  Provider, MD  losartan (COZAAR) 25 MG tablet TAKE 1 TABLET BY MOUTH EVERY DAY 04/03/16   Mihai Croitoru, MD  OXYGEN Inhale 2 L into the lungs. Use @@ Bedtime    Historical Provider, MD  Polyvinyl Alcohol-Povidone (REFRESH OP) Apply 1 drop to eye 4 (four) times daily as needed (dry eyes).    Historical Provider, MD  sotalol (BETAPACE) 80 MG tablet TAKE 1 TABLET BY MOUTH TWICE DAILY 09/05/16   Sanda Klein, MD  tiotropium (SPIRIVA HANDIHALER) 18 MCG inhalation capsule 1 inhale daily 01/04/16   Deneise Lever, MD  warfarin (COUMADIN) 1 MG tablet TAKE 1 TO 2 TABLETS BY MOUTH EVERY DAY AS DIRECTED BY COUMADIN CLINIC 04/11/16   Sanda Klein, MD    Family History Family History  Problem Relation Age of Onset  . Bone cancer Mother   . Cancer Mother     bone  . Bone cancer Father   . Lung cancer Father   . Cancer Father     Lung  . Hyperlipidemia Father   . Hypertension Father   . Hypertension Sister   . Cancer Sister     breast  . Hyperlipidemia Sister   . Hyperlipidemia Sister   . Alzheimer's disease Sister   . Hypertension Sister   . Hyperlipidemia Brother   . Hypertension Brother   . Breast cancer Sister     Social History Social History  Substance Use Topics  . Smoking status: Former Smoker    Packs/day: 2.00    Years: 25.00    Types: Cigarettes    Quit date: 08/15/1987  . Smokeless tobacco: Never Used  . Alcohol use No     Allergies   Augmentin [amoxicillin-pot clavulanate]; Doxycycline; Nitrofurantoin; Amiodarone hcl; Clarithromycin; Clonazepam; Codeine; Diltiazem hcl; Fish oil; Flecainide; Multaq [dronedarone]; Rosuvastatin; and Zetia [ezetimibe]   Review of Systems Review of Systems  All other systems reviewed and are negative.    Physical Exam Updated Vital Signs BP 123/55 (BP Location: Left Arm)   Pulse 87   Temp 98.3 F (36.8 C) (Oral)   Resp 23   Ht 5\' 2"  (1.575 m)   Wt 135 lb (61.2 kg)   SpO2 91% Comment: Respiratory therapist notified  BMI 24.69  kg/m   Physical Exam  Constitutional: She is oriented to person, place, and time. She appears well-developed and well-nourished.  HENT:  Head: Normocephalic and atraumatic.  Eyes: Conjunctivae and EOM are normal.  Neck: Normal range of motion.  Cardiovascular: Normal rate and regular rhythm.   Pulmonary/Chest: Effort normal. No accessory muscle usage or stridor. No tachypnea. No respiratory distress. She has decreased breath sounds. She has no wheezes. She has no rhonchi.  Abdominal: Soft. She exhibits no distension. There is no tenderness. There is no guarding.  Musculoskeletal: She exhibits edema (mild lower extremity). She exhibits no deformity.  Neurological: She is alert and oriented to person, place, and time. No cranial nerve deficit.  Skin: Skin is  warm and dry.  Nursing note and vitals reviewed.    ED Treatments / Results  Labs (all labs ordered are listed, but only abnormal results are displayed) Labs Reviewed  COMPREHENSIVE METABOLIC PANEL - Abnormal; Notable for the following:       Result Value   Glucose, Bld 103 (*)    Total Protein 6.3 (*)    GFR calc non Af Amer 53 (*)    All other components within normal limits  BRAIN NATRIURETIC PEPTIDE - Abnormal; Notable for the following:    B Natriuretic Peptide 257.1 (*)    All other components within normal limits  URINALYSIS, ROUTINE W REFLEX MICROSCOPIC - Abnormal; Notable for the following:    Leukocytes, UA TRACE (*)    All other components within normal limits  URINALYSIS, MICROSCOPIC (REFLEX) - Abnormal; Notable for the following:    Bacteria, UA RARE (*)    Squamous Epithelial / LPF 0-5 (*)    All other components within normal limits  CBC WITH DIFFERENTIAL/PLATELET  TROPONIN I    EKG  EKG Interpretation None       Radiology Dg Chest 2 View  Result Date: 09/09/2016 CLINICAL DATA:  Cough for 1 week, shortness of Breath EXAM: CHEST  2 VIEW COMPARISON:  01/31/2016 FINDINGS: There is hyperinflation of  the lungs compatible with COPD. Scarring in the lingula and right upper lobe. Heart is upper limits normal in size. No acute airspace opacities or effusions. No acute bony abnormality. IMPRESSION: COPD/chronic changes.  No active disease. Electronically Signed   By: Rolm Baptise M.D.   On: 09/09/2016 09:29    Procedures Procedures (including critical care time)  Medications Ordered in ED Medications  albuterol (PROVENTIL) (2.5 MG/3ML) 0.083% nebulizer solution 5 mg (not administered)  albuterol (PROVENTIL) (2.5 MG/3ML) 0.083% nebulizer solution 5 mg (5 mg Nebulization Given 09/09/16 0938)  ipratropium (ATROVENT) nebulizer solution 0.5 mg (0.5 mg Nebulization Given 09/09/16 0938)  predniSONE (DELTASONE) tablet 40 mg (40 mg Oral Given 09/09/16 0957)  levofloxacin (LEVAQUIN) tablet 500 mg (500 mg Oral Given 09/09/16 0957)  albuterol (PROVENTIL) (2.5 MG/3ML) 0.083% nebulizer solution 2.5 mg (2.5 mg Nebulization Given 09/09/16 1203)  ipratropium-albuterol (DUONEB) 0.5-2.5 (3) MG/3ML nebulizer solution 3 mL (3 mLs Nebulization Given 09/09/16 1203)     Initial Impression / Assessment and Plan / ED Course  I have reviewed the triage vital signs and the nursing notes.  Pertinent labs & imaging results that were available during my care of the patient were reviewed by me and considered in my medical decision making (see chart for details).     Likely COPD exacerbation, so will treat as such.  Less likely cardiac as no h/o CHF but does have some mild edema and a progressive cough that suddenly worsened so will screen with troponin/bnp. Doubt PE as COPD is much more likely at this time.   Patient is okay at rest however when she ambulates her saturations into the mid 70s. She is given another DuoNeb and attempted again and she gets dyspneic with desaturations so patient will be admitted for further albuterol, steroids and antibiotics.  Final Clinical Impressions(s) / ED Diagnoses   Final diagnoses:    COPD exacerbation ([place])  Hypoxia  Respiratory distress     Merrily Pew, MD 09/09/16 1555

## 2016-09-09 NOTE — ED Notes (Signed)
Ambulated in hall on r/a HR 80-88, RR 20-24, SpO2 93% falling to 85%.  SpO2 75% once back to room placed on 2 l/m O2 to recover.  SpO2 93% now.

## 2016-09-10 DIAGNOSIS — I482 Chronic atrial fibrillation: Secondary | ICD-10-CM | POA: Diagnosis not present

## 2016-09-10 DIAGNOSIS — Z4682 Encounter for fitting and adjustment of non-vascular catheter: Secondary | ICD-10-CM | POA: Diagnosis not present

## 2016-09-10 DIAGNOSIS — I517 Cardiomegaly: Secondary | ICD-10-CM | POA: Diagnosis not present

## 2016-09-10 DIAGNOSIS — Z803 Family history of malignant neoplasm of breast: Secondary | ICD-10-CM | POA: Diagnosis not present

## 2016-09-10 DIAGNOSIS — I4892 Unspecified atrial flutter: Secondary | ICD-10-CM | POA: Diagnosis not present

## 2016-09-10 DIAGNOSIS — I5032 Chronic diastolic (congestive) heart failure: Secondary | ICD-10-CM | POA: Diagnosis not present

## 2016-09-10 DIAGNOSIS — J9602 Acute respiratory failure with hypercapnia: Secondary | ICD-10-CM | POA: Diagnosis not present

## 2016-09-10 DIAGNOSIS — R791 Abnormal coagulation profile: Secondary | ICD-10-CM | POA: Diagnosis not present

## 2016-09-10 DIAGNOSIS — Z91048 Other nonmedicinal substance allergy status: Secondary | ICD-10-CM | POA: Diagnosis not present

## 2016-09-10 DIAGNOSIS — S270XXS Traumatic pneumothorax, sequela: Secondary | ICD-10-CM | POA: Diagnosis not present

## 2016-09-10 DIAGNOSIS — Z950 Presence of cardiac pacemaker: Secondary | ICD-10-CM | POA: Diagnosis not present

## 2016-09-10 DIAGNOSIS — I10 Essential (primary) hypertension: Secondary | ICD-10-CM | POA: Diagnosis not present

## 2016-09-10 DIAGNOSIS — I4891 Unspecified atrial fibrillation: Secondary | ICD-10-CM | POA: Diagnosis not present

## 2016-09-10 DIAGNOSIS — Z7901 Long term (current) use of anticoagulants: Secondary | ICD-10-CM | POA: Diagnosis not present

## 2016-09-10 DIAGNOSIS — Z885 Allergy status to narcotic agent status: Secondary | ICD-10-CM | POA: Diagnosis not present

## 2016-09-10 DIAGNOSIS — Z66 Do not resuscitate: Secondary | ICD-10-CM | POA: Diagnosis present

## 2016-09-10 DIAGNOSIS — J9601 Acute respiratory failure with hypoxia: Secondary | ICD-10-CM | POA: Diagnosis not present

## 2016-09-10 DIAGNOSIS — J449 Chronic obstructive pulmonary disease, unspecified: Secondary | ICD-10-CM | POA: Diagnosis not present

## 2016-09-10 DIAGNOSIS — R410 Disorientation, unspecified: Secondary | ICD-10-CM | POA: Diagnosis not present

## 2016-09-10 DIAGNOSIS — Z79899 Other long term (current) drug therapy: Secondary | ICD-10-CM | POA: Diagnosis not present

## 2016-09-10 DIAGNOSIS — R0902 Hypoxemia: Secondary | ICD-10-CM | POA: Diagnosis not present

## 2016-09-10 DIAGNOSIS — R0600 Dyspnea, unspecified: Secondary | ICD-10-CM | POA: Diagnosis not present

## 2016-09-10 DIAGNOSIS — J9621 Acute and chronic respiratory failure with hypoxia: Secondary | ICD-10-CM | POA: Diagnosis not present

## 2016-09-10 DIAGNOSIS — B370 Candidal stomatitis: Secondary | ICD-10-CM | POA: Diagnosis not present

## 2016-09-10 DIAGNOSIS — Z938 Other artificial opening status: Secondary | ICD-10-CM | POA: Diagnosis not present

## 2016-09-10 DIAGNOSIS — E785 Hyperlipidemia, unspecified: Secondary | ICD-10-CM | POA: Diagnosis present

## 2016-09-10 DIAGNOSIS — T380X5A Adverse effect of glucocorticoids and synthetic analogues, initial encounter: Secondary | ICD-10-CM | POA: Diagnosis not present

## 2016-09-10 DIAGNOSIS — I495 Sick sinus syndrome: Secondary | ICD-10-CM | POA: Diagnosis not present

## 2016-09-10 DIAGNOSIS — I5031 Acute diastolic (congestive) heart failure: Secondary | ICD-10-CM | POA: Diagnosis not present

## 2016-09-10 DIAGNOSIS — J9811 Atelectasis: Secondary | ICD-10-CM | POA: Diagnosis not present

## 2016-09-10 DIAGNOSIS — J9622 Acute and chronic respiratory failure with hypercapnia: Secondary | ICD-10-CM | POA: Diagnosis not present

## 2016-09-10 DIAGNOSIS — Z801 Family history of malignant neoplasm of trachea, bronchus and lung: Secondary | ICD-10-CM | POA: Diagnosis not present

## 2016-09-10 DIAGNOSIS — Z808 Family history of malignant neoplasm of other organs or systems: Secondary | ICD-10-CM | POA: Diagnosis not present

## 2016-09-10 DIAGNOSIS — I481 Persistent atrial fibrillation: Secondary | ICD-10-CM | POA: Diagnosis not present

## 2016-09-10 DIAGNOSIS — B002 Herpesviral gingivostomatitis and pharyngotonsillitis: Secondary | ICD-10-CM | POA: Diagnosis not present

## 2016-09-10 DIAGNOSIS — I480 Paroxysmal atrial fibrillation: Secondary | ICD-10-CM | POA: Diagnosis not present

## 2016-09-10 DIAGNOSIS — S270XXD Traumatic pneumothorax, subsequent encounter: Secondary | ICD-10-CM | POA: Diagnosis not present

## 2016-09-10 DIAGNOSIS — Z881 Allergy status to other antibiotic agents status: Secondary | ICD-10-CM | POA: Diagnosis not present

## 2016-09-10 DIAGNOSIS — Z888 Allergy status to other drugs, medicaments and biological substances status: Secondary | ICD-10-CM | POA: Diagnosis not present

## 2016-09-10 DIAGNOSIS — N179 Acute kidney failure, unspecified: Secondary | ICD-10-CM | POA: Diagnosis not present

## 2016-09-10 DIAGNOSIS — E874 Mixed disorder of acid-base balance: Secondary | ICD-10-CM | POA: Diagnosis not present

## 2016-09-10 DIAGNOSIS — Z45018 Encounter for adjustment and management of other part of cardiac pacemaker: Secondary | ICD-10-CM | POA: Diagnosis not present

## 2016-09-10 DIAGNOSIS — D72829 Elevated white blood cell count, unspecified: Secondary | ICD-10-CM | POA: Diagnosis not present

## 2016-09-10 DIAGNOSIS — J441 Chronic obstructive pulmonary disease with (acute) exacerbation: Secondary | ICD-10-CM | POA: Diagnosis not present

## 2016-09-10 DIAGNOSIS — J939 Pneumothorax, unspecified: Secondary | ICD-10-CM | POA: Diagnosis not present

## 2016-09-10 DIAGNOSIS — J9690 Respiratory failure, unspecified, unspecified whether with hypoxia or hypercapnia: Secondary | ICD-10-CM | POA: Diagnosis not present

## 2016-09-10 DIAGNOSIS — E875 Hyperkalemia: Secondary | ICD-10-CM | POA: Diagnosis not present

## 2016-09-10 DIAGNOSIS — I110 Hypertensive heart disease with heart failure: Secondary | ICD-10-CM | POA: Diagnosis not present

## 2016-09-10 DIAGNOSIS — R0602 Shortness of breath: Secondary | ICD-10-CM | POA: Diagnosis not present

## 2016-09-10 DIAGNOSIS — Z87891 Personal history of nicotine dependence: Secondary | ICD-10-CM | POA: Diagnosis not present

## 2016-09-10 LAB — RESPIRATORY PANEL BY PCR
ADENOVIRUS-RVPPCR: NOT DETECTED
Bordetella pertussis: NOT DETECTED
CORONAVIRUS NL63-RVPPCR: NOT DETECTED
CORONAVIRUS OC43-RVPPCR: NOT DETECTED
Chlamydophila pneumoniae: NOT DETECTED
Coronavirus 229E: NOT DETECTED
Coronavirus HKU1: NOT DETECTED
INFLUENZA A-RVPPCR: NOT DETECTED
Influenza B: NOT DETECTED
MYCOPLASMA PNEUMONIAE-RVPPCR: NOT DETECTED
Metapneumovirus: NOT DETECTED
PARAINFLUENZA VIRUS 1-RVPPCR: NOT DETECTED
PARAINFLUENZA VIRUS 4-RVPPCR: NOT DETECTED
Parainfluenza Virus 2: NOT DETECTED
Parainfluenza Virus 3: NOT DETECTED
Respiratory Syncytial Virus: NOT DETECTED
Rhinovirus / Enterovirus: NOT DETECTED

## 2016-09-10 LAB — GLUCOSE, CAPILLARY: Glucose-Capillary: 101 mg/dL — ABNORMAL HIGH (ref 65–99)

## 2016-09-10 LAB — BASIC METABOLIC PANEL
Anion gap: 7 (ref 5–15)
BUN: 13 mg/dL (ref 6–20)
CHLORIDE: 104 mmol/L (ref 101–111)
CO2: 28 mmol/L (ref 22–32)
Calcium: 9.3 mg/dL (ref 8.9–10.3)
Creatinine, Ser: 0.97 mg/dL (ref 0.44–1.00)
GFR calc Af Amer: >60 mL/min (ref >60)
GFR calc non Af Amer: 53 mL/min — ABNORMAL LOW (ref >60)
GLUCOSE: 106 mg/dL — AB (ref 65–99)
POTASSIUM: 4.2 mmol/L (ref 3.5–5.1)
Sodium: 139 mmol/L (ref 135–145)

## 2016-09-10 LAB — PROTIME-INR
INR: 2.85
Prothrombin Time: 30.5 seconds — ABNORMAL HIGH (ref 11.4–15.2)

## 2016-09-10 MED ORDER — HYDRALAZINE HCL 20 MG/ML IJ SOLN
10.0000 mg | INTRAMUSCULAR | Status: DC | PRN
  Administered 2016-09-18: 10 mg via INTRAVENOUS
  Filled 2016-09-10: qty 1

## 2016-09-10 MED ORDER — METOPROLOL TARTRATE 5 MG/5ML IV SOLN
2.5000 mg | Freq: Once | INTRAVENOUS | Status: AC
  Administered 2016-09-10: 2.5 mg via INTRAVENOUS
  Filled 2016-09-10: qty 5

## 2016-09-10 MED ORDER — LEVOFLOXACIN 500 MG PO TABS
500.0000 mg | ORAL_TABLET | Freq: Every day | ORAL | Status: DC
  Administered 2016-09-10 – 2016-09-12 (×3): 500 mg via ORAL
  Filled 2016-09-10 (×3): qty 1

## 2016-09-10 MED ORDER — WARFARIN SODIUM 2 MG PO TABS
1.0000 mg | ORAL_TABLET | Freq: Once | ORAL | Status: AC
  Administered 2016-09-10: 1 mg via ORAL
  Filled 2016-09-10: qty 0.5

## 2016-09-10 MED ORDER — METHYLPREDNISOLONE SODIUM SUCC 125 MG IJ SOLR
60.0000 mg | Freq: Two times a day (BID) | INTRAMUSCULAR | Status: DC
  Administered 2016-09-10 – 2016-09-16 (×12): 60 mg via INTRAVENOUS
  Filled 2016-09-10 (×12): qty 2

## 2016-09-10 NOTE — Progress Notes (Signed)
Patient with no complaints or concerns during 7pm - 7am shift.  Adri Schloss, RN 

## 2016-09-10 NOTE — Progress Notes (Signed)
Patient's heart rate still elevated 120s-130s. Patient still asymptomatic.  Dr. Ara Kussmaul made aware.  New order received, will continue to monitor.

## 2016-09-10 NOTE — Progress Notes (Signed)
Pt c/o sob with notable wheezing  and headache Spo2 85% on 2L increased to 3L Breathing treatment being given now respiratory at the bedside.will continue to monitor.

## 2016-09-10 NOTE — Progress Notes (Signed)
ANTICOAGULATION CONSULT NOTE - Initial Consult  Pharmacy Consult for warfarin Indication: atrial fibrillation  Allergies  Allergen Reactions  . Augmentin [Amoxicillin-Pot Clavulanate] Shortness Of Breath  . Doxycycline Shortness Of Breath  . Nitrofurantoin Shortness Of Breath  . Amiodarone Hcl Nausea And Vomiting  . Clarithromycin Other (See Comments)    Crazy feeling, confusion  . Clonazepam Other (See Comments)    Doesn't remember  . Codeine Nausea And Vomiting  . Diltiazem Hcl Nausea And Vomiting  . Fish Oil Nausea And Vomiting  . Flecainide Nausea Only  . Multaq [Dronedarone] Nausea Only  . Rosuvastatin Other (See Comments)    Leg cramps   . Zetia [Ezetimibe] Other (See Comments)    Leg cramps    Patient Measurements: Height: 5\' 2"  (157.5 cm) Weight: 132 lb 6.4 oz (60.1 kg) (c scale) IBW/kg (Calculated) : 50.1 Heparin Dosing Weight: n/a  Vital Signs: Temp: 98.3 F (36.8 C) (01/28 0445) Temp Source: Oral (01/28 0445) BP: 191/80 (01/28 0747) Pulse Rate: 76 (01/28 0445)  Labs:  Recent Labs  09/09/16 0934 09/10/16 0507  HGB 12.6  --   HCT 40.4  --   PLT 166  --   LABPROT  --  30.5*  INR  --  2.85  CREATININE 0.98 0.97  TROPONINI <0.03  --     Estimated Creatinine Clearance: 36 mL/min (by C-G formula based on SCr of 0.97 mg/dL).   Medical History: Past Medical History:  Diagnosis Date  . Abdominal pain, other specified site    [place] - negative abdominal ultrasound  . Abnormal chest x-ray   . Chest pain 01/19/2009   myoview stress - LVEF >75% no reversible ischemia or infarction  . COPD (chronic obstructive pulmonary disease) ([place])   . Dyslipidemia   . Hypertension   . Paroxysmal atrial flutter ([place]) 01/19/2009   echo - EF 0000000; grade 1 diastolic dysfunction; mitral valve- calcified annlus, mildly dilated R ventricle and atrium  . Pneumonia    hx  . PONV (postoperative nausea and vomiting)   . Tremor, essential 09/07/2015     Assessment: 56 yoF  presents w/ SOB on warfarin PTA for hx AFib. Pharmacy consulted to dose warfarin on admit although MD ordered first dose of 1mg . INR therapeutic at 2.85 this morning, per Dr. Ree Kida pharmacy will dose moving forward. CBC wnl, no S/Sx bleeding reported.  PTA Warfarin Dose = 2mg  Mon/Wed/Fri, 1mg  all other days  Goal of Therapy:  INR 2-3 Monitor platelets by anticoagulation protocol: Yes   Plan:  -Warfarin 1mg  PO x1 tonight -Daily INR -Monitor S/Sx bleeding  Arrie Senate, PharmD PGY-1 Pharmacy Resident Pager: [phone] 09/10/2016

## 2016-09-10 NOTE — Progress Notes (Signed)
PROGRESS NOTE    Claudia [name]  [id number] DOB: 08-Mar-1933 DOA: 09/09/2016 PCP: Pcp Not In System   Chief Complaint  Patient presents with  . Shortness of Breath  . Cough    Brief Narrative:  HPI on 09/09/2016 by Dr. Christia Reading Opyd Claudia [name] is a 83 y.o. female with medical history significant for paroxysmal atrial fibrillation on warfarin, COPD with chronic hypoxic respiratory failure, chronic diastolic CHF, and hypertension who presents in transfer from [facility] where she presented with 2 weeks of dry cough and dyspnea. Patient reports that she had been in her usual state of health until the insidious development of a dry cough and mild exertional dyspnea approximately 2 weeks ago. These symptoms have been intermittent since that time, but have become more consistent over the past couple days. She had dyspnea with minimal exertion yesterday, then after a coughing fit last night, had difficulty catching her breath and has experienced persistent shortness of breath while at rest since that time. Patient denies any recent fevers or chills, rhinorrhea, sore throat, or headache. She denies any chest pain or palpitations, and also denies lower extremity swelling or orthopnea. Patient denies sick contacts, recent long distance travel, or recent antibiotic use.   Assessment & Plan   COPD with acute exacerbation / Acute hypoxic respiratory failure -Presented with progressive cough and dyspnea  -Hypoxic to 76% on room air with exertion, 88% while at rest  -CXR is clear and there is no evidence for an infectious etiology  -Continue DuoNebs -Will place on levaquin -Switch prednisone to IV solumedrol given patient's continued wheezing and hypoxia -Continue supplemental oxygen to maintain sats >92% -Patient may benefit from nebulizer at home  Chronic diastolic CHF  -Appears euvolemic on presentation; BNP noted to be mildly elevated, but no JVD, peripheral edema, gallop, or  crackles  -TTE (01/19/09) with EF 0000000, grade 1 diastolic dysfunction, and mild MR  -Has not required diuretics at home -continue losartan  -monitor daily weights, intake/output  Paroxysmal atrial fibrillation  -In a sinus rhythm on presentation  -CHADS-VASc is 39 (age x2, gender, HTN, CHF)  -Continue AC with warfarin -Continue sotalol    Essential Hypertension  -BP mildly elevated on admission  -continue Norvasc and losartan -Hydralazine IV PRN  DVT Prophylaxis  coumadin  Code Status: Full  Family Communication: none at bedside  Disposition Plan: Observation. Will place on IV steroids and continue to monitor  Consultants None  Procedures  None  Antibiotics   Anti-infectives    Start     Dose/Rate Route Frequency Ordered Stop   09/10/16 1000  levofloxacin (LEVAQUIN) tablet 500 mg     500 mg Oral Daily 09/10/16 0853     09/09/16 0930  levofloxacin (LEVAQUIN) tablet 500 mg     500 mg Oral  Once 09/09/16 [id number] 09/09/16 0957      Subjective:   Claudia [name] seen and examined today.  Patient feels breathing has mildly improved but not back to baseline. Does have occasional cough, now productive.  Denies chest pain, abdominal pain, N/V/D/C.    Objective:   Vitals:   09/10/16 0445 09/10/16 0747 09/10/16 0757 09/10/16 1120  BP: (!) 143/53 (!) 191/80  139/71  Pulse: 76   88  Resp: 18   20  Temp: 98.3 F (36.8 C)   98 F (36.7 C)  TempSrc: Oral   Oral  SpO2: 95%  (!) 88% 97%  Weight: 60.1 kg (132 lb 6.4 oz)  Height:        Intake/Output Summary (Last 24 hours) at 09/10/16 1249 Last data filed at 09/10/16 0548  Gross per 24 hour  Intake              125 ml  Output              750 ml  Net             -625 ml   Filed Weights   09/09/16 0858 09/09/16 2106 09/10/16 0445  Weight: 61.2 kg (135 lb) 60.5 kg (133 lb 4.8 oz) 60.1 kg (132 lb 6.4 oz)    Exam  General: Well developed, well nourished, NAD, appears stated age  76: NCAT, mucous membranes moist.    Neck: Supple, no JVD, no masses  Cardiovascular: S1 S2 auscultated, 2/6SEM, RRR  Respiratory: Diffuse expiratory wheezing, occ cough  Abdomen: Soft, nontender, nondistended, + bowel sounds  Extremities: warm dry without cyanosis clubbing or edema  Neuro: AAOx3, nonfocal  Psych: Normal affect and demeanor with intact judgement and insight   Data Reviewed: I have personally reviewed following labs and imaging studies  CBC:  Recent Labs Lab 09/09/16 0934  WBC 5.3  NEUTROABS 3.8  HGB 12.6  HCT 40.4  MCV 91.2  PLT [id number]   Basic Metabolic Panel:  Recent Labs Lab 09/09/16 0934 09/10/16 0507  NA 137 139  K 4.2 4.2  CL 102 104  CO2 30 28  GLUCOSE 103* 106*  BUN 11 13  CREATININE 0.98 0.97  CALCIUM 9.1 9.3   GFR: Estimated Creatinine Clearance: 36 mL/min (by C-G formula based on SCr of 0.97 mg/dL). Liver Function Tests:  Recent Labs Lab 09/09/16 0934  AST 23  ALT 14  ALKPHOS 78  BILITOT 0.8  PROT 6.3*  ALBUMIN 4.0   No results for input(s): LIPASE, AMYLASE in the last 168 hours. No results for input(s): AMMONIA in the last 168 hours. Coagulation Profile:  Recent Labs Lab 09/04/16 0936 09/10/16 0507  INR 2.5 2.85   Cardiac Enzymes:  Recent Labs Lab 09/09/16 0934  TROPONINI <0.03   BNP (last 3 results) No results for input(s): PROBNP in the last 8760 hours. HbA1C: No results for input(s): HGBA1C in the last 72 hours. CBG:  Recent Labs Lab 09/10/16 0641  GLUCAP 101*   Lipid Profile: No results for input(s): CHOL, HDL, LDLCALC, TRIG, CHOLHDL, LDLDIRECT in the last 72 hours. Thyroid Function Tests: No results for input(s): TSH, T4TOTAL, FREET4, T3FREE, THYROIDAB in the last 72 hours. Anemia Panel: No results for input(s): VITAMINB12, FOLATE, FERRITIN, TIBC, IRON, RETICCTPCT in the last 72 hours. Urine analysis:    Component Value Date/Time   COLORURINE YELLOW 09/09/2016 1050   APPEARANCEUR CLEAR 09/09/2016 1050   LABSPEC 1.007  09/09/2016 1050   PHURINE 7.5 09/09/2016 1050   GLUCOSEU NEGATIVE 09/09/2016 1050   HGBUR NEGATIVE 09/09/2016 1050   BILIRUBINUR NEGATIVE 09/09/2016 1050   KETONESUR NEGATIVE 09/09/2016 1050   PROTEINUR NEGATIVE 09/09/2016 1050   UROBILINOGEN 0.2 01/17/2009 1750   NITRITE NEGATIVE 09/09/2016 1050   LEUKOCYTESUR TRACE (A) 09/09/2016 1050   Sepsis Labs: @LABRCNTIP (procalcitonin:4,lacticidven:4)  )No results found for this or any previous visit (from the past 240 hour(s)).    Radiology Studies: Dg Chest 2 View  Result Date: 09/09/2016 CLINICAL DATA:  Cough for 1 week, shortness of Breath EXAM: CHEST  2 VIEW COMPARISON:  01/31/2016 FINDINGS: There is hyperinflation of the lungs compatible with COPD. Scarring in the lingula and right upper  lobe. Heart is upper limits normal in size. No acute airspace opacities or effusions. No acute bony abnormality. IMPRESSION: COPD/chronic changes.  No active disease. Electronically Signed   By: Rolm Baptise M.D.   On: 09/09/2016 09:29     Scheduled Meds: . amLODipine  2.5 mg Oral Daily  . aspirin EC  81 mg Oral Daily  . levofloxacin  500 mg Oral Daily  . losartan  25 mg Oral Daily  . mouth rinse  15 mL Mouth Rinse BID  . polyvinyl alcohol  1 drop Both Eyes TID  . predniSONE  40 mg Oral Q breakfast  . sodium chloride flush  3 mL Intravenous Q12H  . sodium chloride flush  3 mL Intravenous Q12H  . sotalol  80 mg Oral BID  . warfarin  1 mg Oral ONCE-1800  . Warfarin - Pharmacist Dosing Inpatient   Does not apply q1800   Continuous Infusions:   LOS: 0 days   Time Spent in minutes   30 minutes  Claudia [name] D.O. on 09/10/2016 at 12:49 PM  Between 7am to 7pm - Pager - [phone]  After 7pm go to [email or web address] - password [email or web address]  And look for the night coverage person covering for me after hours  [facility] Office  [phone]

## 2016-09-10 NOTE — Progress Notes (Signed)
Patient converted to a-fib around 7:30pm and heart rate 120s-140s.  Patient asymptomatic and VSS.  Patient given scheduled sotolol.  An hour and a half after, heart rate still 120s-140s.  Dr. Ara Kussmaul made aware.  Will continue to monitor.

## 2016-09-11 LAB — BASIC METABOLIC PANEL
Anion gap: 10 (ref 5–15)
BUN: 20 mg/dL (ref 6–20)
CO2: 29 mmol/L (ref 22–32)
CREATININE: 0.99 mg/dL (ref 0.44–1.00)
Calcium: 9.5 mg/dL (ref 8.9–10.3)
Chloride: 103 mmol/L (ref 101–111)
GFR calc Af Amer: >60 mL/min (ref >60)
GFR, EST NON AFRICAN AMERICAN: 52 mL/min — AB (ref >60)
GLUCOSE: 133 mg/dL — AB (ref 65–99)
POTASSIUM: 4.1 mmol/L (ref 3.5–5.1)
SODIUM: 142 mmol/L (ref 135–145)

## 2016-09-11 LAB — CBC
HCT: 42.5 % (ref 36.0–46.0)
Hemoglobin: 13.2 g/dL (ref 12.0–15.0)
MCH: 28 pg (ref 26.0–34.0)
MCHC: 31.1 g/dL (ref 30.0–36.0)
MCV: 90.2 fL (ref 78.0–100.0)
PLATELETS: 182 10*3/uL (ref 150–400)
RBC: 4.71 MIL/uL (ref 3.87–5.11)
RDW: 14 % (ref 11.5–15.5)
WBC: 8.2 10*3/uL (ref 4.0–10.5)

## 2016-09-11 LAB — GLUCOSE, CAPILLARY: GLUCOSE-CAPILLARY: 121 mg/dL — AB (ref 65–99)

## 2016-09-11 LAB — PROTIME-INR
INR: 3.13
Prothrombin Time: 32.9 s — ABNORMAL HIGH (ref 11.4–15.2)

## 2016-09-11 MED ORDER — METOPROLOL TARTRATE 5 MG/5ML IV SOLN
2.5000 mg | Freq: Once | INTRAVENOUS | Status: AC
  Administered 2016-09-11: 2.5 mg via INTRAVENOUS
  Filled 2016-09-11: qty 5

## 2016-09-11 MED ORDER — METOPROLOL TARTRATE 5 MG/5ML IV SOLN
2.5000 mg | Freq: Four times a day (QID) | INTRAVENOUS | Status: DC | PRN
  Administered 2016-09-11 – 2016-09-12 (×2): 2.5 mg via INTRAVENOUS
  Filled 2016-09-11 (×2): qty 5

## 2016-09-11 MED ORDER — WARFARIN 0.5 MG HALF TABLET
0.5000 mg | ORAL_TABLET | Freq: Once | ORAL | Status: AC
  Administered 2016-09-11: 0.5 mg via ORAL
  Filled 2016-09-11: qty 1

## 2016-09-11 MED ORDER — IPRATROPIUM-ALBUTEROL 0.5-2.5 (3) MG/3ML IN SOLN
3.0000 mL | Freq: Four times a day (QID) | RESPIRATORY_TRACT | Status: DC
  Administered 2016-09-11 – 2016-09-13 (×8): 3 mL via RESPIRATORY_TRACT
  Filled 2016-09-11 (×10): qty 3

## 2016-09-11 NOTE — Progress Notes (Addendum)
B/P 130/71, Pulse 110s-120s at present.  Patient resting sitting in chair.  Denies pain.  Dr. Ree Kida updated with most recent VSs.

## 2016-09-11 NOTE — Progress Notes (Signed)
Patient's heart rate varying between 112 - 140s this morning.  Current b/p = 102/80 at rest, while sitting in chair. Discussed with Dr. Ree Kida via telephone conversation and Metoprolol 2.5mg  IV x 1 ordered.

## 2016-09-11 NOTE — Progress Notes (Signed)
Charge nurse made aware that patient's heart rate 120s-140s and patient sleeping in bed.

## 2016-09-11 NOTE — Progress Notes (Signed)
[place] for warfarin Indication: atrial fibrillation  Allergies  Allergen Reactions  . Augmentin [Amoxicillin-Pot Clavulanate] Shortness Of Breath  . Doxycycline Shortness Of Breath  . Nitrofurantoin Shortness Of Breath  . Amiodarone Hcl Nausea And Vomiting  . Clarithromycin Other (See Comments)    Crazy feeling, confusion  . Clonazepam Other (See Comments)    Doesn't remember reaction  . Codeine Nausea And Vomiting  . Diltiazem Hcl Nausea And Vomiting  . Fish Oil Nausea And Vomiting  . Flecainide Nausea Only  . Multaq [Dronedarone] Nausea Only  . Rosuvastatin Other (See Comments)    Leg cramps   . Zetia [Ezetimibe] Other (See Comments)    Leg cramps    Patient Measurements: Height: 5\' 2"  (157.5 cm) Weight: 130 lb (59 kg) IBW/kg (Calculated) : 50.1 Heparin Dosing Weight: n/a  Vital Signs: Temp: 97.7 F (36.5 C) (01/29 0548) Temp Source: Oral (01/29 0548) BP: 102/80 (01/29 1014) Pulse Rate: 127 (01/29 1014)  Labs:  Recent Labs  09/09/16 0934 09/10/16 0507 09/11/16 0311  HGB 12.6  --  13.2  HCT 40.4  --  42.5  PLT 166  --  182  LABPROT  --  30.5* 32.9*  INR  --  2.85 3.13  CREATININE 0.98 0.97 0.99  TROPONINI <0.03  --   --     Estimated Creatinine Clearance: 35.2 mL/min (by C-G formula based on SCr of 0.99 mg/dL).   Assessment: 66 yoF presents w/ SOB on warfarin PTA for hx AFib. Pharmacy consulted to dose warfarin. INR supratherapeutic at 3.13 this morning. Noted interaction with Levaquin which can increase INR. CBC wnl, no S/Sx bleeding reported.  PTA Warfarin Dose = 2mg  Mon/Wed/Fri, 1mg  all other days  Goal of Therapy:  INR 2-3 Monitor platelets by anticoagulation protocol: Yes   Plan:  -Warfarin 0.5 mg PO tonight -Daily INR -Monitor S/Sx bleeding  Renold Genta, PharmD, BCPS Clinical Pharmacist Phone for today - [place] - [phone] 09/11/2016 11:10 AM

## 2016-09-11 NOTE — Discharge Instructions (Addendum)
Oral Thrush, Adult Oral thrush, also called oral candidiasis, is a fungal infection that develops in the mouth and throat and on the tongue. It causes white patches to form on the mouth and tongue. Claudia [name] is most common in older adults, but it can occur at any age. Many cases of thrush are mild, but this infection can also be serious. Claudia [name] can be a repeated (recurrent) problem for certain people who have a weak body defense system (immune system). The weakness can be caused by chronic illnesses, or by taking medicines that limit the body's ability to fight infection. If a person has difficulty fighting infection, the fungus that causes thrush can spread through the body. This can cause life-threatening blood or organ infections. What are the causes? This condition is caused by a fungus (yeast) called Candida albicans.  This fungus is normally present in small amounts in the mouth and on other mucous membranes. It usually causes no harm.  If conditions are present that allow the fungus to grow without control, it invades surrounding tissues and becomes an infection.  Other Candida species can also lead to thrush (rare). What increases the risk? This condition is more likely to develop in:  People with a weakened immune system.  Older adults.  People with HIV (human immunodeficiency virus).  People with diabetes.  People with dry mouth (xerostomia).  Pregnant women.  People with poor dental care, especially people who have false teeth.  People who use antibiotic medicines. What are the signs or symptoms? Symptoms of this condition can vary from mild and moderate to severe and persistent. Symptoms may include:  A burning feeling in the mouth and throat. This can occur at the start of a thrush infection.  White patches that stick to the mouth and tongue. The tissue around the patches may be red, raw, and painful. If rubbed (during tooth brushing, for example), the patches and the  tissue of the mouth may bleed easily.  A bad taste in the mouth or difficulty tasting foods.  A cottony feeling in the mouth.  Pain during eating and swallowing.  Poor appetite.  Cracking at the corners of the mouth. How is this diagnosed? This condition is diagnosed based on:  Physical exam. Your health care provider will look in your mouth.  Health history. Your health care provider will ask you questions about your health. How is this treated? This condition is treated with medicines called antifungals, which prevent the growth of fungi. These medicines are either applied directly to the affected area (topical) or swallowed (oral). The treatment will depend on the severity of the condition. Mild thrush  Mild cases of thrush may clear up with the use of an antifungal mouth rinse or lozenges. Treatment usually lasts about 14 days. Moderate to severe thrush  More severe thrush infections that have spread to the esophagus are treated with an oral antifungal medicine. A topical antifungal medicine may also be used.  For some severe infections, treatment may need to continue for more than 14 days.  Oral antifungal medicines are rarely used during pregnancy because they may be harmful to the unborn child. If you are pregnant, talk with your health care provider about options for treatment. Persistent or recurrent thrush  For cases of thrush that do not go away or keep coming back:  Treatment may be needed twice as long as the symptoms last.  Treatment will include both oral and topical antifungal medicines.  People with a weakened immune system can  take an antifungal medicine on a continuous basis to prevent thrush infections. It is important to treat conditions that make a person more likely to get thrush, such as diabetes or HIV. Follow these instructions at home: Medicines  Take over-the-counter and prescription medicines only as told by your health care provider.  Talk with  your health care provider about an over-the-counter medicine called gentian violet, which kills bacteria and fungi. Relieving soreness and discomfort  To help reduce the discomfort of thrush:  Drink cold liquids such as water or iced tea.  Try flavored ice treats or frozen juices.  Eat foods that are easy to swallow, such as gelatin, ice cream, or custard.  Try drinking from a straw if the patches in your mouth are painful. General instructions  Eat plain, unflavored yogurt as directed by your health care provider. Check the label to make sure the yogurt contains live cultures. This yogurt can help healthy bacteria to grow in the mouth and can stop the growth of the fungus that causes thrush.  If you wear dentures, remove the dentures before going to bed, brush them vigorously, and soak them in a cleaning solution as directed by your health care provider.  Rinse your mouth with a warm salt-water mixture several times a day. To make a salt-water mixture, completely dissolve 1/2-1 tsp of salt in 1 cup of warm water. Contact a health care provider if:  Your symptoms are getting worse or are not improving within 7 days of starting treatment.  You have symptoms of a spreading infection, such as white patches on the skin outside of the mouth. This information is not intended to replace advice given to you by your health care provider. Make sure you discuss any questions you have with your health care provider. Document Released: 04/25/2004 Document Revised: 04/24/2016 Document Reviewed: 04/24/2016 Elsevier Interactive Patient Education  2017 [place]. Acute Respiratory Failure, Adult Acute respiratory failure occurs when there is not enough oxygen passing from your lungs to your body. When this happens, your lungs have trouble removing carbon dioxide from the blood. This causes your blood oxygen level to drop too low as carbon dioxide builds up. Acute respiratory failure is a medical  emergency. It can develop quickly, but it is temporary if treated promptly. Your lung capacity, or how much air your lungs can hold, may improve with time, exercise, and treatment. What are the causes? There are many possible causes of acute respiratory failure, including:  Lung injury.  Chest injury or damage to the ribs or tissues near the lungs.  Lung conditions that affect the flow of air and blood into and out of the lungs, such as pneumonia, acute respiratory distress syndrome, and cystic fibrosis.  Medical conditions, such as strokes or spinal cord injuries, that affect the muscles and nerves that control breathing.  Blood infection (sepsis).  Inflammation of the pancreas (pancreatitis).  A blood clot in the lungs (pulmonary embolism).  A large-volume blood transfusion.  Burns.  Near-drowning.  Seizure.  Smoke inhalation.  Reaction to medicines.  Alcohol or drug overdose. What increases the risk? This condition is more likely to develop in people who have:  A blocked airway.  Asthma.  A condition or disease that damages or weakens the muscles, nerves, bones, or tissues that are involved in breathing.  A serious infection.  A health problem that blocks the unconscious reflex that is involved in breathing, such as hypothyroidism or sleep apnea.  A lung injury or trauma. What  are the signs or symptoms? Trouble breathing is the main symptom of acute respiratory failure. Symptoms may also include:  Rapid breathing.  Restlessness or anxiety.  Skin, lips, or fingernails that appear blue (cyanosis).  Rapid heart rate.  Abnormal heart rhythms (arrhythmias).  Confusion or changes in behavior.  Tiredness or loss of energy.  Feeling sleepy or having a loss of consciousness. How is this diagnosed? Your health care provider can diagnose acute respiratory failure with a medical history and physical exam. During the exam, your health care provider will listen to  your heart and check for crackling or wheezing sounds in your lungs. Your may also have tests to confirm the diagnosis and determine what is causing respiratory failure. These tests may include:  Measuring the amount of oxygen in your blood (pulse oximetry). The measurement comes from a small device that is placed on your finger, earlobe, or toe.  Other blood tests to measure blood gases and to look for signs of infection.  Sampling your cerebral spinal fluid or tracheal fluid to check for infections.  Chest X-ray to look for fluid in spaces that should be filled with air.  Electrocardiogram (ECG) to look at the heart's electrical activity. How is this treated? Treatment for this condition usually takes places in a hospital intensive care unit (ICU). Treatment depends on what is causing the condition. It may include one or more treatments until your symptoms improve. Treatment may include:  Supplemental oxygen. Extra oxygen is given through a tube in the nose, a face mask, or a hood.  A device such as a continuous positive airway pressure (CPAP) or bi-level positive airway pressure (BiPAP or BPAP) machine. This treatment uses mild air pressure to keep the airways open. A mask or other device will be placed over your nose or mouth. A tube that is connected to a motor will deliver oxygen through the mask.  Ventilator. This treatment helps move air into and out of the lungs. This may be done with a bag and mask or a machine. For this treatment, a tube is placed in your windpipe (trachea) so air and oxygen can flow to the lungs.  Extracorporeal membrane oxygenation (ECMO). This treatment temporarily takes over the function of the heart and lungs, supplying oxygen and removing carbon dioxide. ECMO gives the lungs a chance to recover. It may be used if a ventilator is not effective.  Tracheostomy. This is a procedure that creates a hole in the neck to insert a breathing tube.  Receiving fluids and  medicines.  Rocking the bed to help breathing. Follow these instructions at home:  Take over-the-counter and prescription medicines only as told by your health care provider.  Return to normal activities as told by your health care provider. Ask your health care provider what activities are safe for you.  Keep all follow-up visits as told by your health care provider. This is important. How is this prevented? Treating infections and medical conditions that may lead to acute respiratory failure can help prevent the condition from developing. Contact a health care provider if:  You have a fever.  Your symptoms do not improve or they get worse. Get help right away if:  You are having trouble breathing.  You lose consciousness.  Your have cyanosis or turn blue.  You develop a rapid heart rate.  You are confused. These symptoms may represent a serious problem that is an emergency. Do not wait to see if the symptoms will go away. Get medical  help right away. Call your local emergency services (911 in the [place] Do not drive yourself to the hospital.  This information is not intended to replace advice given to you by your health care provider. Make sure you discuss any questions you have with your health care provider. Document Released: 08/05/2013 Document Revised: 02/26/2016 Document Reviewed: 02/16/2016 Elsevier Interactive Patient Education  2017 [place]. Atrial Fibrillation Atrial fibrillation is a type of irregular or rapid heartbeat (arrhythmia). In atrial fibrillation, the heart quivers continuously in a chaotic pattern. This occurs when parts of the heart receive disorganized signals that make the heart unable to pump blood normally. This can increase the risk for stroke, heart failure, and other heart-related conditions. There are different types of atrial fibrillation, including:  Paroxysmal atrial fibrillation. This type starts suddenly, and it usually stops on its own  shortly after it starts.  Persistent atrial fibrillation. This type often lasts longer than a week. It may stop on its own or with treatment.  Long-lasting persistent atrial fibrillation. This type lasts longer than 12 months.  Permanent atrial fibrillation. This type does not go away. Talk with your health care provider to learn about the type of atrial fibrillation that you have. What are the causes? This condition is caused by some heart-related conditions or procedures, including:  A heart attack.  Coronary artery disease.  Heart failure.  Heart valve conditions.  High blood pressure.  Inflammation of the sac that surrounds the heart (pericarditis).  Heart surgery.  Certain heart rhythm disorders, such as Wolf-Parkinson-White syndrome. Other causes include:  Pneumonia.  Obstructive sleep apnea.  Blockage of an artery in the lungs (pulmonary embolism, or PE).  Lung cancer.  Chronic lung disease.  Thyroid problems, especially if the thyroid is overactive (hyperthyroidism).  Caffeine.  Excessive alcohol use or illegal drug use.  Use of some medicines, including certain decongestants and diet pills. Sometimes, the cause cannot be found. What increases the risk? This condition is more likely to develop in:  People who are older in age.  People who smoke.  People who have diabetes mellitus.  People who are overweight (obese).  Athletes who exercise vigorously. What are the signs or symptoms? Symptoms of this condition include:  A feeling that your heart is beating rapidly or irregularly.  A feeling of discomfort or pain in your chest.  Shortness of breath.  Sudden light-headedness or weakness.  Getting tired easily during exercise. In some cases, there are no symptoms. How is this diagnosed? Your health care provider may be able to detect atrial fibrillation when taking your pulse. If detected, this condition may be diagnosed with:  An  electrocardiogram (ECG).  A Holter monitor test that records your heartbeat patterns over a 24-hour period.  Transthoracic echocardiogram (TTE) to evaluate how blood flows through your heart.  Transesophageal echocardiogram (TEE) to view more detailed images of your heart.  A stress test.  Imaging tests, such as a CT scan or chest X-ray.  Blood tests. How is this treated? The main goals of treatment are to prevent blood clots from forming and to keep your heart beating at a normal rate and rhythm. The type of treatment that you receive depends on many factors, such as your underlying medical conditions and how you feel when you are experiencing atrial fibrillation. This condition may be treated with:  Medicine to slow down the heart rate, bring the hearts rhythm back to normal, or prevent clots from forming.  Electrical cardioversion. This is a  procedure that resets your hearts rhythm by delivering a controlled, low-energy shock to the heart through your skin.  Different types of ablation, such as catheter ablation, catheter ablation with pacemaker, or surgical ablation. These procedures destroy the heart tissues that send abnormal signals. When the pacemaker is used, it is placed under your skin to help your heart beat in a regular rhythm. Follow these instructions at home:  Take over-the counter and prescription medicines only as told by your health care provider.  If your health care provider prescribed a blood-thinning medicine (anticoagulant), take it exactly as told. Taking too much blood-thinning medicine can cause bleeding. If you do not take enough blood-thinning medicine, you will not have the protection that you need against stroke and other problems.  Do not use tobacco products, including cigarettes, chewing tobacco, and e-cigarettes. If you need help quitting, ask your health care provider.  If you have obstructive sleep apnea, manage your condition as told by your  health care provider.  Do not drink alcohol.  Do not drink beverages that contain caffeine, such as coffee, soda, and tea.  Maintain a healthy weight. Do not use diet pills unless your health care provider approves. Diet pills may make heart problems worse.  Follow diet instructions as told by your health care provider.  Exercise regularly as told by your health care provider.  Keep all follow-up visits as told by your health care provider. This is important. How is this prevented?  Avoid drinking beverages that contain caffeine or alcohol.  Avoid certain medicines, especially medicines that are used for breathing problems.  Avoid certain herbs and herbal medicines, such as those that contain ephedra or ginseng.  Do not use illegal drugs, such as cocaine and amphetamines.  Do not smoke.  Manage your high blood pressure. Contact a health care provider if:  You notice a change in the rate, rhythm, or strength of your heartbeat.  You are taking an anticoagulant and you notice increased bruising.  You tire more easily when you exercise or exert yourself. Get help right away if:  You have chest pain, abdominal pain, sweating, or weakness.  You feel nauseous.  You notice blood in your vomit, bowel movement, or urine.  You have shortness of breath.  You suddenly have swollen feet and ankles.  You feel dizzy.  You have sudden weakness or numbness of the face, arm, or leg, especially on one side of the body.  You have trouble speaking, trouble understanding, or both (aphasia).  Your face or your eyelid droops on one side. These symptoms may represent a serious problem that is an emergency. Do not wait to see if the symptoms will go away. Get medical help right away. Call your local emergency services (911 in the [place] Do not drive yourself to the hospital.  This information is not intended to replace advice given to you by your health care provider. Make sure you discuss  any questions you have with your health care provider. Document Released: 07/31/2005 Document Revised: 12/08/2015 Document Reviewed: 11/25/2014 Elsevier Interactive Patient Education  2017 [place] Discharge Instructions for  Pacemaker/Defibrillator Patients  Activity No heavy lifting or vigorous activity with your left/right arm for 6 to 8 weeks.  Do not raise your left/right arm above your head for one week.  Gradually raise your affected arm as drawn below.             09/22/16  09/23/16                    09/24/16                   09/25/16 __  NO DRIVING for  1 week   ; you may begin driving on  [id number]   .  WOUND CARE - Keep the wound area clean and dry.  Do not get this area wet for one week. No showers for one week; you may shower on 09/25/16    . - The tape/steri-strips on your wound will fall off; do not pull them off.  No bandage is needed on the site.  DO  NOT apply any creams, oils, or ointments to the wound area. - If you notice any drainage or discharge from the wound, any swelling or bruising at the site, or you develop a fever > 101? F after you are discharged home, call the office at once.  Special Instructions - You are still able to use cellular telephones; use the ear opposite the side where you have your pacemaker/defibrillator.  Avoid carrying your cellular phone near your device. - When traveling through airports, show security personnel your identification card to avoid being screened in the metal detectors.  Ask the security personnel to use the hand wand. - Avoid arc welding equipment, MRI testing (magnetic resonance imaging), TENS units (transcutaneous nerve stimulators).  Call the office for questions about other devices. - Avoid electrical appliances that are in poor condition or are not properly grounded. - Microwave ovens are safe to be near or to operate.  Additional information for defibrillator patients should your  device go off: - If your device goes off ONCE and you feel fine afterward, notify the device clinic nurses. - If your device goes off ONCE and you do not feel well afterward, call 911. - If your device goes off TWICE, call 911. - If your device goes off THREE times in one day, call 911.  DO NOT DRIVE YOURSELF OR A FAMILY MEMBER WITH A DEFIBRILLATOR TO THE HOSPITAL--CALL 911.    Information on my medicine - Coumadin   (Warfarin)  This medication education was reviewed with me or my healthcare representative as part of my discharge preparation.  The pharmacist that spoke with me during my hospital stay was:  [facility], Margot Chimes, [facility]  Why was Coumadin prescribed for you? Coumadin was prescribed for you because you have a blood clot or a medical condition that can cause an increased risk of forming blood clots. Blood clots can cause serious health problems by blocking the flow of blood to the heart, lung, or brain. Coumadin can prevent harmful blood clots from forming. As a reminder your indication for Coumadin is:   Stroke Prevention Because Of Atrial Fibrillation  What test will check on my response to Coumadin? While on Coumadin (warfarin) you will need to have an INR test regularly to ensure that your dose is keeping you in the desired range. The INR (international normalized ratio) number is calculated from the result of the laboratory test called prothrombin time (PT).  If an INR APPOINTMENT HAS NOT ALREADY BEEN MADE FOR YOU please schedule an appointment to have this lab work done by your health care provider within 7 days. Your INR goal is usually a number between:  2 to 3 or your provider may give you a more narrow range like 2-2.5.  Ask your health care provider during an office  visit what your goal INR is.  What  do you need to  know  About  COUMADIN? Take Coumadin (warfarin) exactly as prescribed by your healthcare provider about the same time each day.  DO NOT stop taking  without talking to the doctor who prescribed the medication.  Stopping without other blood clot prevention medication to take the place of Coumadin may increase your risk of developing a new clot or stroke.  Get refills before you run out.  What do you do if you miss a dose? If you miss a dose, take it as soon as you remember on the same day then continue your regularly scheduled regimen the next day.  Do not take two doses of Coumadin at the same time.  Important Safety Information A possible side effect of Coumadin (Warfarin) is an increased risk of bleeding. You should call your healthcare provider right away if you experience any of the following: ? Bleeding from an injury or your nose that does not stop. ? Unusual colored urine (red or dark brown) or unusual colored stools (red or black). ? Unusual bruising for unknown reasons. ? A serious fall or if you hit your head (even if there is no bleeding).  Some foods or medicines interact with Coumadin (warfarin) and might alter your response to warfarin. To help avoid this: ? Eat a balanced diet, maintaining a consistent amount of Vitamin K. ? Notify your provider about major diet changes you plan to make. ? Avoid alcohol or limit your intake to 1 drink for women and 2 drinks for men per day. (1 drink is 5 oz. wine, 12 oz. beer, or 1.5 oz. liquor.)  Make sure that ANY health care provider who prescribes medication for you knows that you are taking Coumadin (warfarin).  Also make sure the healthcare provider who is monitoring your Coumadin knows when you have started a new medication including herbals and non-prescription products.  Coumadin (Warfarin)  Major Drug Interactions  Increased Warfarin Effect Decreased Warfarin Effect  Alcohol (large quantities) Antibiotics (esp. Septra/Bactrim, Flagyl, Cipro) Amiodarone (Cordarone) Aspirin (ASA) Cimetidine (Tagamet) Megestrol (Megace) NSAIDs (ibuprofen, naproxen, etc.) Piroxicam  (Feldene) Propafenone (Rythmol SR) Propranolol (Inderal) Isoniazid (INH) Posaconazole (Noxafil) Barbiturates (Phenobarbital) Carbamazepine (Tegretol) Chlordiazepoxide (Librium) Cholestyramine (Questran) Griseofulvin Oral Contraceptives Rifampin Sucralfate (Carafate) Vitamin K   Coumadin (Warfarin) Major Herbal Interactions  Increased Warfarin Effect Decreased Warfarin Effect  Garlic Ginseng Ginkgo biloba Coenzyme Q10 Green tea [place] Johns wort    Coumadin (Warfarin) FOOD Interactions  Eat a consistent number of servings per week of foods HIGH in Vitamin K (1 serving =  cup)  Collards (cooked, or boiled & drained) Kale (cooked, or boiled & drained) Mustard greens (cooked, or boiled & drained) Parsley *serving size only =  cup Spinach (cooked, or boiled & drained) Swiss chard (cooked, or boiled & drained) Turnip greens (cooked, or boiled & drained)  Eat a consistent number of servings per week of foods MEDIUM-HIGH in Vitamin K (1 serving = 1 cup)  Asparagus (cooked, or boiled & drained) Broccoli (cooked, boiled & drained, or raw & chopped) Brussel sprouts (cooked, or boiled & drained) *serving size only =  cup Lettuce, raw (green leaf, endive, romaine) Spinach, raw Turnip greens, raw & chopped   These websites have more information on Coumadin (warfarin):  FailFactory.se; VeganReport.com.au;   You have an appointment set up with the [facility] Clinic.  Multiple studies have shown that being followed by a dedicated atrial fibrillation clinic in addition to the  standard care you receive from your other physicians improves health. We believe that enrollment in the atrial fibrillation clinic will allow [place] to better care for you.   The phone number to the [facility] is [phone]. The clinic is staffed Monday through Friday from 8:30am to 5pm.  Parking Directions: The clinic is located in the Heart and Vascular Building  connected to [facility]. 1)From [address] turn on to [address] and go to the 3rd entrance  (Heart and Vascular entrance) on the right. 2)Look to the right for Heart &Vascular Parking Garage. 3)A code for the entrance is 5001 4)Take the elevators to the 1st floor. Registration is in the room with the glass walls at the end of the hallway.  If you have any trouble parking or locating the clinic, please dont hesitate to call [phone].

## 2016-09-11 NOTE — Progress Notes (Signed)
PROGRESS NOTE    Claudia [name]  [id number] DOB: 1932/10/05 DOA: 09/09/2016 PCP: Pcp Not In System   Chief Complaint  Patient presents with  . Shortness of Breath  . Cough    Brief Narrative:  HPI on 09/09/2016 by Dr. Christia Reading Opyd Claudia [name] is a 83 y.o. female with medical history significant for paroxysmal atrial fibrillation on warfarin, COPD with chronic hypoxic respiratory failure, chronic diastolic CHF, and hypertension who presents in transfer from [facility] where she presented with 2 weeks of dry cough and dyspnea. Patient reports that she had been in her usual state of health until the insidious development of a dry cough and mild exertional dyspnea approximately 2 weeks ago. These symptoms have been intermittent since that time, but have become more consistent over the past couple days. She had dyspnea with minimal exertion yesterday, then after a coughing fit last night, had difficulty catching her breath and has experienced persistent shortness of breath while at rest since that time. Patient denies any recent fevers or chills, rhinorrhea, sore throat, or headache. She denies any chest pain or palpitations, and also denies lower extremity swelling or orthopnea. Patient denies sick contacts, recent long distance travel, or recent antibiotic use.   Assessment & Plan   COPD with acute exacerbation / Acute hypoxic respiratory failure -Presented with progressive cough and dyspnea  -Hypoxic to 76% on room air with exertion, 88% while at rest  -CXR is clear and there is no evidence for an infectious etiology  -Continue DuoNebs -Placed on levaquin, however, patient feels that levaquin is adding to her shortness of breath -Continue IV solumedrol, supplemental O2 to maintain sats >92%, nebs -Patient may benefit from nebulizer at home  Chronic diastolic CHF  -Appears euvolemic on presentation; BNP noted to be mildly elevated, but no JVD, peripheral edema, gallop,  or crackles  -TTE (01/19/09) with EF 0000000, grade 1 diastolic dysfunction, and mild MR  -Has not required diuretics at home -continue losartan- will hold   -monitor daily weights, intake/output  Paroxysmal atrial fibrillation, RVR -Currently in Afib -CHADS-VASc is 14 (age x2, gender, HTN, CHF)  -Continue AC with warfarin -Continue sotalol   -will give one dose of metoprolol 2.5mg  IV and continue to monitor -Will avoid cardizem drip as patient's BP is soft  Essential Hypertension  -BP mildly elevated on admission  -Norvasc and losartan- will hold as patients BP is soft -Hydralazine IV PRN  DVT Prophylaxis  coumadin  Code Status: Full  Family Communication: none at bedside  Disposition Plan: Admitted. Continue IV steroids, treatment for COPD and Afib. Home when stable/improved.  Consultants None  Procedures  None  Antibiotics   Anti-infectives    Start     Dose/Rate Route Frequency Ordered Stop   09/10/16 1000  levofloxacin (LEVAQUIN) tablet 500 mg     500 mg Oral Daily 09/10/16 0853     09/09/16 0930  levofloxacin (LEVAQUIN) tablet 500 mg     500 mg Oral  Once 09/09/16 [id number] 09/09/16 0957      Subjective:   Claudia [name] seen and examined today.  Patient feels breathing has mildly improved however not at her baseline. She feels levaquin may be adding to her shortness of breath, and states she took it about 15 years ago.  Continues to have cough. Denies chest pain, abdominal pain, N/V/D/C.    Objective:   Vitals:   09/10/16 1951 09/11/16 0548 09/11/16 0832 09/11/16 1014  BP: (!) 159/89 (!) 151/96  102/80  Pulse: (!) 116 (!) 133  (!) 127  Resp: 18 18    Temp: 97.8 F (36.6 C) 97.7 F (36.5 C)    TempSrc: Oral Oral    SpO2: 97% 97% 98% 96%  Weight:  59 kg (130 lb)    Height:        Intake/Output Summary (Last 24 hours) at 09/11/16 1238 Last data filed at 09/11/16 0912  Gross per 24 hour  Intake              600 ml  Output             1150 ml  Net              -550 ml   Filed Weights   09/09/16 2106 09/10/16 0445 09/11/16 0548  Weight: 60.5 kg (133 lb 4.8 oz) 60.1 kg (132 lb 6.4 oz) 59 kg (130 lb)    Exam  General: Well developed, well nourished, NAD, appears stated age  HEENT: NCAT, mucous membranes moist.   Cardiovascular: S1 S2 auscultated, 2/6SEM, RRR  Respiratory: Diffuse expiratory wheezing, occ cough  Abdomen: Soft, nontender, nondistended, + bowel sounds  Extremities: warm dry without cyanosis clubbing or edema  Neuro: AAOx3, nonfocal  Psych: Mildly anxious, however appropriate   Data Reviewed: I have personally reviewed following labs and imaging studies  CBC:  Recent Labs Lab 09/09/16 0934 09/11/16 0311  WBC 5.3 8.2  NEUTROABS 3.8  --   HGB 12.6 13.2  HCT 40.4 42.5  MCV 91.2 90.2  PLT 166 [id number]   Basic Metabolic Panel:  Recent Labs Lab 09/09/16 0934 09/10/16 0507 09/11/16 0311  NA 137 139 142  K 4.2 4.2 4.1  CL 102 104 103  CO2 30 28 29   GLUCOSE 103* 106* 133*  BUN 11 13 20   CREATININE 0.98 0.97 0.99  CALCIUM 9.1 9.3 9.5   GFR: Estimated Creatinine Clearance: 35.2 mL/min (by C-G formula based on SCr of 0.99 mg/dL). Liver Function Tests:  Recent Labs Lab 09/09/16 0934  AST 23  ALT 14  ALKPHOS 78  BILITOT 0.8  PROT 6.3*  ALBUMIN 4.0   No results for input(s): LIPASE, AMYLASE in the last 168 hours. No results for input(s): AMMONIA in the last 168 hours. Coagulation Profile:  Recent Labs Lab 09/10/16 0507 09/11/16 0311  INR 2.85 3.13   Cardiac Enzymes:  Recent Labs Lab 09/09/16 0934  TROPONINI <0.03   BNP (last 3 results) No results for input(s): PROBNP in the last 8760 hours. HbA1C: No results for input(s): HGBA1C in the last 72 hours. CBG:  Recent Labs Lab 09/10/16 0641 09/11/16 0547  GLUCAP 101* 121*   Lipid Profile: No results for input(s): CHOL, HDL, LDLCALC, TRIG, CHOLHDL, LDLDIRECT in the last 72 hours. Thyroid Function Tests: No results for input(s): TSH,  T4TOTAL, FREET4, T3FREE, THYROIDAB in the last 72 hours. Anemia Panel: No results for input(s): VITAMINB12, FOLATE, FERRITIN, TIBC, IRON, RETICCTPCT in the last 72 hours. Urine analysis:    Component Value Date/Time   COLORURINE YELLOW 09/09/2016 1050   APPEARANCEUR CLEAR 09/09/2016 1050   LABSPEC 1.007 09/09/2016 1050   PHURINE 7.5 09/09/2016 1050   GLUCOSEU NEGATIVE 09/09/2016 1050   HGBUR NEGATIVE 09/09/2016 1050   BILIRUBINUR NEGATIVE 09/09/2016 1050   KETONESUR NEGATIVE 09/09/2016 1050   PROTEINUR NEGATIVE 09/09/2016 1050   UROBILINOGEN 0.2 01/17/2009 1750   NITRITE NEGATIVE 09/09/2016 1050   LEUKOCYTESUR TRACE (A) 09/09/2016 1050   Sepsis Labs: @LABRCNTIP (procalcitonin:4,lacticidven:4)  ) Recent  Results (from the past 240 hour(s))  Respiratory Panel by PCR     Status: None   Collection Time: 09/10/16  1:22 PM  Result Value Ref Range Status   Adenovirus NOT DETECTED NOT DETECTED Final   Coronavirus 229E NOT DETECTED NOT DETECTED Final   Coronavirus HKU1 NOT DETECTED NOT DETECTED Final   Coronavirus NL63 NOT DETECTED NOT DETECTED Final   Coronavirus OC43 NOT DETECTED NOT DETECTED Final   Metapneumovirus NOT DETECTED NOT DETECTED Final   Rhinovirus / Enterovirus NOT DETECTED NOT DETECTED Final   Influenza A NOT DETECTED NOT DETECTED Final   Influenza B NOT DETECTED NOT DETECTED Final   Parainfluenza Virus 1 NOT DETECTED NOT DETECTED Final   Parainfluenza Virus 2 NOT DETECTED NOT DETECTED Final   Parainfluenza Virus 3 NOT DETECTED NOT DETECTED Final   Parainfluenza Virus 4 NOT DETECTED NOT DETECTED Final   Respiratory Syncytial Virus NOT DETECTED NOT DETECTED Final   Bordetella pertussis NOT DETECTED NOT DETECTED Final   Chlamydophila pneumoniae NOT DETECTED NOT DETECTED Final   Mycoplasma pneumoniae NOT DETECTED NOT DETECTED Final      Radiology Studies: No results found.   Scheduled Meds: . amLODipine  2.5 mg Oral Daily  . aspirin EC  81 mg Oral Daily  .  ipratropium-albuterol  3 mL Nebulization Q6H  . levofloxacin  500 mg Oral Daily  . losartan  25 mg Oral Daily  . mouth rinse  15 mL Mouth Rinse BID  . methylPREDNISolone (SOLU-MEDROL) injection  60 mg Intravenous Q12H  . polyvinyl alcohol  1 drop Both Eyes TID  . sodium chloride flush  3 mL Intravenous Q12H  . sodium chloride flush  3 mL Intravenous Q12H  . sotalol  80 mg Oral BID  . warfarin  0.5 mg Oral ONCE-1800  . Warfarin - Pharmacist Dosing Inpatient   Does not apply q1800   Continuous Infusions:   LOS: 1 day   Time Spent in minutes   30 minutes  Lael Wetherbee D.O. on 09/11/2016 at 12:38 PM  Between 7am to 7pm - Pager - [phone]  After 7pm go to [email or web address] - password [email or web address]  And look for the night coverage person covering for me after hours  [facility] Office  [phone]

## 2016-09-11 NOTE — Progress Notes (Signed)
Heart rate = 115

## 2016-09-11 NOTE — Progress Notes (Addendum)
Pulse = 126. B/P = 126/70.  Metoprolol 2.5mg  IV given as prn

## 2016-09-11 NOTE — Plan of Care (Signed)
Problem: Activity: Goal: Ability to implement measures to reduce episodes of fatigue will improve Outcome: Progressing Up in chair today  Problem: Respiratory: Goal: Levels of oxygenation will improve 02 decreased from 3L nasal cannula to 2L nasal cannula

## 2016-09-11 NOTE — Progress Notes (Signed)
Pulse = 112

## 2016-09-11 NOTE — Progress Notes (Signed)
Patient's heart rate still sustaining 130s-140s.  Patient sleeping in bed. Dr. Ara Kussmaul paged again.  Will continue to monitor.

## 2016-09-11 NOTE — Progress Notes (Signed)
Patient's heart rate down to 110s after IV metoprolol given, however now it is back up to 130s.  Dr. On call paged.  Will continue to monitor.

## 2016-09-12 ENCOUNTER — Inpatient Hospital Stay (HOSPITAL_COMMUNITY): Payer: Medicare Other

## 2016-09-12 DIAGNOSIS — J441 Chronic obstructive pulmonary disease with (acute) exacerbation: Secondary | ICD-10-CM

## 2016-09-12 DIAGNOSIS — J9621 Acute and chronic respiratory failure with hypoxia: Secondary | ICD-10-CM

## 2016-09-12 DIAGNOSIS — I495 Sick sinus syndrome: Principal | ICD-10-CM

## 2016-09-12 DIAGNOSIS — I480 Paroxysmal atrial fibrillation: Secondary | ICD-10-CM

## 2016-09-12 DIAGNOSIS — I10 Essential (primary) hypertension: Secondary | ICD-10-CM

## 2016-09-12 LAB — BLOOD GAS, ARTERIAL
ACID-BASE EXCESS: 8.4 mmol/L — AB (ref 0.0–2.0)
Acid-Base Excess: 9.5 mmol/L — ABNORMAL HIGH (ref 0.0–2.0)
BICARBONATE: 35 mmol/L — AB (ref 20.0–28.0)
Bicarbonate: 36.9 mmol/L — ABNORMAL HIGH (ref 20.0–28.0)
DELIVERY SYSTEMS: POSITIVE
DRAWN BY: 31101
Drawn by: 30136
EXPIRATORY PAP: 5
FIO2: 0.4
Inspiratory PAP: 10
MODE: POSITIVE
O2 CONTENT: 2 L/min
O2 SAT: 97.2 %
O2 Saturation: 96.1 %
PATIENT TEMPERATURE: 98.6
PO2 ART: 105 mmHg (ref 83.0–108.0)
Patient temperature: 98.6
RATE: 15 resp/min
pCO2 arterial: 90.1 mmHg (ref 32.0–48.0)
pCO2 arterial: 76 mmHg (ref 32.0–48.0)
pH, Arterial: 7.235 — ABNORMAL LOW (ref 7.350–7.450)
pH, Arterial: 7.285 — ABNORMAL LOW (ref 7.350–7.450)
pO2, Arterial: 84.6 mmHg (ref 83.0–108.0)

## 2016-09-12 LAB — CBC
HEMATOCRIT: 44.5 % (ref 36.0–46.0)
Hemoglobin: 13.3 g/dL (ref 12.0–15.0)
MCH: 27.7 pg (ref 26.0–34.0)
MCHC: 29.9 g/dL — ABNORMAL LOW (ref 30.0–36.0)
MCV: 92.7 fL (ref 78.0–100.0)
Platelets: 222 10*3/uL (ref 150–400)
RBC: 4.8 MIL/uL (ref 3.87–5.11)
RDW: 14.3 % (ref 11.5–15.5)
WBC: 11.5 10*3/uL — AB (ref 4.0–10.5)

## 2016-09-12 LAB — BASIC METABOLIC PANEL
Anion gap: 8 (ref 5–15)
BUN: 35 mg/dL — ABNORMAL HIGH (ref 6–20)
CO2: 33 mmol/L — AB (ref 22–32)
Calcium: 9.5 mg/dL (ref 8.9–10.3)
Chloride: 102 mmol/L (ref 101–111)
Creatinine, Ser: 1.04 mg/dL — ABNORMAL HIGH (ref 0.44–1.00)
GFR, EST AFRICAN AMERICAN: 57 mL/min — AB (ref >60)
GFR, EST NON AFRICAN AMERICAN: 49 mL/min — AB (ref >60)
GLUCOSE: 142 mg/dL — AB (ref 65–99)
POTASSIUM: 4.6 mmol/L (ref 3.5–5.1)
Sodium: 143 mmol/L (ref 135–145)

## 2016-09-12 LAB — PROTIME-INR
INR: 3.59
Prothrombin Time: 36.7 seconds — ABNORMAL HIGH (ref 11.4–15.2)

## 2016-09-12 LAB — TSH: TSH: 0.528 u[IU]/mL (ref 0.350–4.500)

## 2016-09-12 LAB — MAGNESIUM: Magnesium: 2.2 mg/dL (ref 1.7–2.4)

## 2016-09-12 LAB — GLUCOSE, CAPILLARY: Glucose-Capillary: 130 mg/dL — ABNORMAL HIGH (ref 65–99)

## 2016-09-12 MED ORDER — SOTALOL HCL 80 MG PO TABS
80.0000 mg | ORAL_TABLET | Freq: Two times a day (BID) | ORAL | Status: DC
  Filled 2016-09-12: qty 1

## 2016-09-12 MED ORDER — METOPROLOL TARTRATE 5 MG/5ML IV SOLN
5.0000 mg | Freq: Once | INTRAVENOUS | Status: AC
  Administered 2016-09-12: 5 mg via INTRAVENOUS
  Filled 2016-09-12: qty 5

## 2016-09-12 MED ORDER — LEVOFLOXACIN 500 MG PO TABS: 250.0000 mg | ORAL_TABLET | Freq: Every day | ORAL | Status: DC

## 2016-09-12 MED ORDER — METOPROLOL TARTRATE 5 MG/5ML IV SOLN
2.5000 mg | Freq: Once | INTRAVENOUS | Status: DC
  Filled 2016-09-12: qty 5

## 2016-09-12 NOTE — Significant Event (Signed)
Rapid Response Event Note  Overview: Time Called: 1730 Arrival Time: 1730 Event Type: Respiratory  Initial Focused Assessment: Patient had RAF again this am then shortly before daily medications she became bradycardic. Cardiology saw patient as consult.  This afternoon she had some respiratory distress. ABG done 7.23/90/105/36  Interventions: Placed patient on Bipap She fell asleep BP 124/61 SRwith PACs 40-60s  RR24 O2 sat 100% on Bipap  Transferred to [place] (if not transferred):  Event Summary: Name of Physician Notified: Dr Ree Kida at bedside at [place]    at    Outcome: Transferred (Comment)  Event End Time: [place]  Raliegh Ip

## 2016-09-12 NOTE — Progress Notes (Signed)
Central tele called and patient's heart rate 140s-150s.  Patient states nausea a little better and has not vomited any more.  BP stable. Dr. Ree Kida made aware.  Will continue to monitor.

## 2016-09-12 NOTE — Progress Notes (Signed)
Report given to RN on 4E.  Patient family at bedside and updated with room change.  Patient transferred with rapid response, RT, and this RN on BiPAP.

## 2016-09-12 NOTE — Progress Notes (Signed)
Patient has voided 71ml earlier in the day.  Bladder scan showed 37ml.  Dr. Ree Kida aware, will continue to monitor.

## 2016-09-12 NOTE — Progress Notes (Signed)
[place] for warfarin Indication: atrial fibrillation  Allergies  Allergen Reactions  . Augmentin [Amoxicillin-Pot Clavulanate] Shortness Of Breath  . Doxycycline Shortness Of Breath  . Nitrofurantoin Shortness Of Breath  . Amiodarone Hcl Nausea And Vomiting  . Clarithromycin Other (See Comments)    Crazy feeling, confusion  . Clonazepam Other (See Comments)    Doesn't remember reaction  . Codeine Nausea And Vomiting  . Diltiazem Hcl Nausea And Vomiting  . Fish Oil Nausea And Vomiting  . Flecainide Nausea Only  . Multaq [Dronedarone] Nausea Only  . Rosuvastatin Other (See Comments)    Leg cramps   . Zetia [Ezetimibe] Other (See Comments)    Leg cramps    Patient Measurements: Height: 5\' 2"  (157.5 cm) Weight: 131 lb 6.4 oz (59.6 kg) (scale c) IBW/kg (Calculated) : 50.1 Heparin Dosing Weight: n/a  Vital Signs: Temp: 97.8 F (36.6 C) (01/30 0953) Temp Source: Oral (01/30 0953) BP: 116/38 (01/30 0956) Pulse Rate: 53 (01/30 0956)  Labs:  Recent Labs  09/10/16 0507 09/11/16 0311 09/12/16 0501  HGB  --  13.2 13.3  HCT  --  42.5 44.5  PLT  --  182 222  LABPROT 30.5* 32.9* 36.7*  INR 2.85 3.13 3.59  CREATININE 0.97 0.99 1.04*    Estimated Creatinine Clearance: 33.6 mL/min (by C-G formula based on SCr of 1.04 mg/dL (H)).   Assessment: 76 yoF presents w/ SOB on warfarin PTA for hx AFib. Pharmacy consulted to dose warfarin. INR continues to increase and now is 3.59. Likely related to drug interaction with levaquin. No bleeding noted.   PTA Warfarin Dose = 2mg  Mon/Wed/Fri, 1mg  all other days  Goal of Therapy:  INR 2-3 Monitor platelets by anticoagulation protocol: Yes   Plan:  No warfarin tonight Daily INR  Salome Arnt, PharmD, BCPS Pager # [phone] 09/12/2016 11:10 AM

## 2016-09-12 NOTE — Progress Notes (Signed)
Received a phone call from central tele that patient's heart rate sustaining in the 30s and 40s.  Pt. Is saying that she is feeling nauseous and had 2 episode of vomiting.  Blood pressure stable.  Rapid response and Dr. Ree Kida called and made aware.  Will continue to monitor.

## 2016-09-12 NOTE — Progress Notes (Signed)
Patient's heart rate back down between 30s-60s.  Patient asymptomatic and BP stable.  Dr. Ree Kida made aware who said to hold metoprolol and sotalol and that cardiology will be seeing the patient.  Will continue to monitor.

## 2016-09-12 NOTE — Progress Notes (Signed)
ekg done secondary to change in rhythm from afib rvr to NSR. HR nor in 70's 80's.monitored pt closely

## 2016-09-12 NOTE — Significant Event (Signed)
Rapid Response Event Note  Overview: Time Called: [id number] Arrival Time: 0750 Event Type: Cardiac  Initial Focused Assessment: Per RN patient with AF rate 120-130 yesterday and last night, converted to SR about 4am this morning.  Her HR was in the 80s then she suddenly became bradycardic rate of 30s after being nauseated and vomiting.  Upon my arrival patient's HR SR 40s and improving. She is alert and complains of being nauseated, but no Chest pain or SOB.      Interventions:  Plan of Care (if not transferred): RN to call if patient has symptomatic bradycardia again. Or if assistance needed.  Event Summary: Name of Physician Notified: MiKhail at [place]    at    Outcome: Stayed in room and stabalized  Event End Time: 0830  Raliegh Ip

## 2016-09-12 NOTE — Progress Notes (Addendum)
PROGRESS NOTE    Claudia [name]  [id number] DOB: 08/28/1932 DOA: 09/09/2016 PCP: Pcp Not In System   Chief Complaint  Patient presents with  . Shortness of Breath  . Cough    Brief Narrative:  HPI on 09/09/2016 by Dr. Christia Reading Opyd Claudia [name] is a 83 y.o. female with medical history significant for paroxysmal atrial fibrillation on warfarin, COPD with chronic hypoxic respiratory failure, chronic diastolic CHF, and hypertension who presents in transfer from [facility] where she presented with 2 weeks of dry cough and dyspnea. Patient reports that she had been in her usual state of health until the insidious development of a dry cough and mild exertional dyspnea approximately 2 weeks ago. These symptoms have been intermittent since that time, but have become more consistent over the past couple days. She had dyspnea with minimal exertion yesterday, then after a coughing fit last night, had difficulty catching her breath and has experienced persistent shortness of breath while at rest since that time. Patient denies any recent fevers or chills, rhinorrhea, sore throat, or headache. She denies any chest pain or palpitations, and also denies lower extremity swelling or orthopnea. Patient denies sick contacts, recent long distance travel, or recent antibiotic use.   Interim history Being treated for COPD exacerbation.  Developed Afib RVR, given small doses of IV metoprolol and responded well. This morning however, patient had episode of nausea vomiting, and HR fell to 30, sinus.  BP stable. Rapid response called. HR improved to 60s, then back to Afib RVR at 150.  No medications given.  HR back to 30s. Cardiology consulted.  Assessment & Plan   Sinus bradycardia/ Tachy-brady syndrome -Patient's HR fell to 30s Sinus, however with no intervention, back to Afib RVR, rate 150.  Again no medications given, HR back to 30s. Rapid response called. -Wonder if first episode of bradycardia  due to vasovagal cause, as patient did have nausea and vomiting. -Cardiology consulted and appreciated, ?need for pacemaker -Held sotalol and metoprolol IV -CXR obtained, no infection appreciated -Will obtain echocardiogram, TSH, magnesium level (potassium 4.6)  COPD with acute exacerbation / Acute hypoxic respiratory failure -Presented with progressive cough and dyspnea  -Hypoxic to 76% on room air with exertion, 88% while at rest  -CXR is clear and there is no evidence for an infectious etiology  -Continue DuoNebs -Placed on levaquin, however, patient feels that levaquin is adding to her shortness of breath -Continue IV solumedrol, supplemental O2 to maintain sats >92%, nebs -Patient may benefit from nebulizer at home -Repeat CXR today showed COPD, no pneumonia  Chronic diastolic CHF  -Appears euvolemic on presentation; BNP noted to be mildly elevated, but no JVD, peripheral edema, gallop, or crackles  -TTE (01/19/09) with EF 0000000, grade 1 diastolic dysfunction, and mild MR  -Has not required diuretics at home -continue losartan- will hold   -monitor daily weights, intake/output  Paroxysmal atrial fibrillation, RVR -Currently in Afib -CHADS-VASc is 42 (age x2, gender, HTN, CHF)  -Continue AC with warfarin -sotalol held (see discussion above)  Essential Hypertension  -BP mildly elevated on admission  -Norvasc and losartan- will hold as patients BP is soft -Hydralazine IV PRN  Nausea/vomiting -unknown etiology  -Continue antiemetics PRN  DVT Prophylaxis  coumadin  Code Status: Full  Family Communication: none at bedside  Disposition Plan: Admitted. Continue IV steroids, treatment for COPD and Afib. Home when stable/improved.  Consultants Cardiology   Procedures  None  Antibiotics   Anti-infectives  Start     Dose/Rate Route Frequency Ordered Stop   09/10/16 1000  levofloxacin (LEVAQUIN) tablet 500 mg     500 mg Oral Daily 09/10/16 0853     09/09/16 0930   levofloxacin (LEVAQUIN) tablet 500 mg     500 mg Oral  Once 09/09/16 [id number] 09/09/16 0957      Subjective:   Claudia [name] seen and examined today.  Patient very sleepy this morning.  Had episode of nausea/vomiting this morning.  Feels breathing is "ok".  Continues to feel nauseous. Currently denies chest pain or abdominal pain.     Objective:   Vitals:   09/12/16 0802 09/12/16 0915 09/12/16 0953 09/12/16 0956  BP: (!) 115/41 113/84  (!) 116/38  Pulse: (!) 42 (!) 143  (!) 53  Resp: 20     Temp:   97.8 F (36.6 C)   TempSrc:   Oral   SpO2: 100%     Weight:      Height:        Intake/Output Summary (Last 24 hours) at 09/12/16 1049 Last data filed at 09/12/16 0959  Gross per 24 hour  Intake              843 ml  Output              475 ml  Net              368 ml   Filed Weights   09/10/16 0445 09/11/16 0548 09/12/16 0649  Weight: 60.1 kg (132 lb 6.4 oz) 59 kg (130 lb) 59.6 kg (131 lb 6.4 oz)    Exam  General: Well developed, well nourished, somnolent, no distress  HEENT: NCAT, mucous membranes moist.   Cardiovascular: S1 S2 auscultated, 2/6SEM,regular-bradycardic  Respiratory: Diminished breath sounds, Expiratory wheezing  Abdomen: Soft, nontender, nondistended, + bowel sounds  Extremities: warm dry without cyanosis clubbing or edema  Neuro: AAOx3, nonfocal  Psych: Appropriate mood and affect   Data Reviewed: I have personally reviewed following labs and imaging studies  CBC:  Recent Labs Lab 09/09/16 0934 09/11/16 0311 09/12/16 0501  WBC 5.3 8.2 11.5*  NEUTROABS 3.8  --   --   HGB 12.6 13.2 13.3  HCT 40.4 42.5 44.5  MCV 91.2 90.2 92.7  PLT 166 182 [id number]   Basic Metabolic Panel:  Recent Labs Lab 09/09/16 0934 09/10/16 0507 09/11/16 0311 09/12/16 0501  NA 137 139 142 143  K 4.2 4.2 4.1 4.6  CL 102 104 103 102  CO2 30 28 29  33*  GLUCOSE 103* 106* 133* 142*  BUN 11 13 20  35*  CREATININE 0.98 0.97 0.99 1.04*  CALCIUM 9.1 9.3 9.5 9.5    GFR: Estimated Creatinine Clearance: 33.6 mL/min (by C-G formula based on SCr of 1.04 mg/dL (H)). Liver Function Tests:  Recent Labs Lab 09/09/16 0934  AST 23  ALT 14  ALKPHOS 78  BILITOT 0.8  PROT 6.3*  ALBUMIN 4.0   No results for input(s): LIPASE, AMYLASE in the last 168 hours. No results for input(s): AMMONIA in the last 168 hours. Coagulation Profile:  Recent Labs Lab 09/10/16 0507 09/11/16 0311 09/12/16 0501  INR 2.85 3.13 3.59   Cardiac Enzymes:  Recent Labs Lab 09/09/16 0934  TROPONINI <0.03   BNP (last 3 results) No results for input(s): PROBNP in the last 8760 hours. HbA1C: No results for input(s): HGBA1C in the last 72 hours. CBG:  Recent Labs Lab 09/10/16 0641 09/11/16 0547 09/12/16 0555  GLUCAP  101* 121* 130*   Lipid Profile: No results for input(s): CHOL, HDL, LDLCALC, TRIG, CHOLHDL, LDLDIRECT in the last 72 hours. Thyroid Function Tests: No results for input(s): TSH, T4TOTAL, FREET4, T3FREE, THYROIDAB in the last 72 hours. Anemia Panel: No results for input(s): VITAMINB12, FOLATE, FERRITIN, TIBC, IRON, RETICCTPCT in the last 72 hours. Urine analysis:    Component Value Date/Time   COLORURINE YELLOW 09/09/2016 1050   APPEARANCEUR CLEAR 09/09/2016 1050   LABSPEC 1.007 09/09/2016 1050   PHURINE 7.5 09/09/2016 1050   GLUCOSEU NEGATIVE 09/09/2016 1050   HGBUR NEGATIVE 09/09/2016 1050   BILIRUBINUR NEGATIVE 09/09/2016 1050   KETONESUR NEGATIVE 09/09/2016 1050   PROTEINUR NEGATIVE 09/09/2016 1050   UROBILINOGEN 0.2 01/17/2009 1750   NITRITE NEGATIVE 09/09/2016 1050   LEUKOCYTESUR TRACE (A) 09/09/2016 1050   Sepsis Labs: @LABRCNTIP (procalcitonin:4,lacticidven:4)  ) Recent Results (from the past 240 hour(s))  Respiratory Panel by PCR     Status: None   Collection Time: 09/10/16  1:22 PM  Result Value Ref Range Status   Adenovirus NOT DETECTED NOT DETECTED Final   Coronavirus 229E NOT DETECTED NOT DETECTED Final   Coronavirus HKU1  NOT DETECTED NOT DETECTED Final   Coronavirus NL63 NOT DETECTED NOT DETECTED Final   Coronavirus OC43 NOT DETECTED NOT DETECTED Final   Metapneumovirus NOT DETECTED NOT DETECTED Final   Rhinovirus / Enterovirus NOT DETECTED NOT DETECTED Final   Influenza A NOT DETECTED NOT DETECTED Final   Influenza B NOT DETECTED NOT DETECTED Final   Parainfluenza Virus 1 NOT DETECTED NOT DETECTED Final   Parainfluenza Virus 2 NOT DETECTED NOT DETECTED Final   Parainfluenza Virus 3 NOT DETECTED NOT DETECTED Final   Parainfluenza Virus 4 NOT DETECTED NOT DETECTED Final   Respiratory Syncytial Virus NOT DETECTED NOT DETECTED Final   Bordetella pertussis NOT DETECTED NOT DETECTED Final   Chlamydophila pneumoniae NOT DETECTED NOT DETECTED Final   Mycoplasma pneumoniae NOT DETECTED NOT DETECTED Final      Radiology Studies: Dg Chest Port 1 View  Result Date: 09/12/2016 CLINICAL DATA:  Shortness of breath, abnormal lung sounds at the bases bilaterally. History of COPD, atrial flutter, CHF. EXAM: PORTABLE CHEST 1 VIEW COMPARISON:  PA and lateral chest x-ray of September 09, 2016 FINDINGS: The lungs remain mildly hyperinflated. The interstitial markings remain coarse especially in the right upper lobe. There is no alveolar infiltrate or pleural effusion. The cardiac silhouette is top-normal in size. The pulmonary vascularity is normal. There is calcification in the wall of the aortic arch. The trachea is midline. The bony thorax exhibits no acute abnormality. There are chronic changes of both shoulders. IMPRESSION: COPD.  Mild cardiomegaly.  No pneumonia nor pulmonary edema. Thoracic aortic atherosclerosis. Electronically Signed   By: David  Martinique M.D.   On: 09/12/2016 08:35     Scheduled Meds: . aspirin EC  81 mg Oral Daily  . ipratropium-albuterol  3 mL Nebulization Q6H  . levofloxacin  500 mg Oral Daily  . mouth rinse  15 mL Mouth Rinse BID  . methylPREDNISolone (SOLU-MEDROL) injection  60 mg Intravenous  Q12H  . polyvinyl alcohol  1 drop Both Eyes TID  . sodium chloride flush  3 mL Intravenous Q12H  . sodium chloride flush  3 mL Intravenous Q12H  . Warfarin - Pharmacist Dosing Inpatient   Does not apply q1800   Continuous Infusions:   LOS: 2 days   Time Spent in minutes   30 minutes  Marbeth Smedley D.O. on 09/12/2016 at 10:49 AM  Between 7am to 7pm - Pager - [phone]  After 7pm go to [email or web address] - password [email or web address]  And look for the night coverage person covering for me after hours  [facility] Office  [phone]

## 2016-09-12 NOTE — Progress Notes (Signed)
Increased pt. bipap settings to 18/6. Pt. Is on the bipap through the servo I.

## 2016-09-12 NOTE — Progress Notes (Signed)
CRITICAL VALUE ALERT  Critical value received: pH 7.28, CO2 76, Bicarb 35, Acid-base 8.4  Date of notification: 09/12/16  Time of notification:  2135  Critical value read back:Yes.    Nurse who received alert:  Elvis Coil  MD notified (1st page):  Schorr  Time of first page:  2140    No new orders at this time, will continue to monitor.

## 2016-09-12 NOTE — Consult Note (Signed)
Cardiology Consultation Note    Patient ID: Claudia [name], MRN: [id number], DOB/AGE: 08-24-1932 83 y.o. Admit date: 09/09/2016   Date of Consult: 09/12/2016 Primary Physician: Pcp Not In System Primary Cardiologist: Dr. Sallyanne Kuster  Chief Complaint: SOB Reason for Consultation: Atrial Fibrillation with Tachycardia, Sinus bradycardia Requesting MD: Jerilynn Mages. Mikhail, DO  HPI: Claudia [name] is a 83 y.o. female with history of paroxysmal atrial fibrillation on chronic warfarin, COPD with chronic hypoxic respiratory failure, chronic diastolic heart failure, and HTN who was transferred from [facility] for COPD excerebration.   Dr Sallyanne Kuster saw 12/14. The patient has history of paroxysmal atrial flutter and fibrillation. She has been treated previously with flecainide and amiodarone, not tolerated. She has been tolerating Sotalol. Past Echo showed EF 55-60%, normal wall motion, G1DD and mild right atrium dilatation. Noted to have SOB at baseline.   SOB improved during hospital stay on nebs and steroids. She developed a-fib RVR and HR improved on IV metoprolol. Today, the patient woke up with severe nausea and 3 episodes of vomiting. Pt did not eat breakfast or lunch. Pts HR fell to SR in the 30-40s after N&V, BP stable. Without intervention the HR returned to atrial fib RVR in the 140-150's. Without intervention, the patient went back in Sinus brady in the 30-60s. The patient was asymptomatic at the time. Metoprolol and Sotalol held by IM. CXR after event showed no changes. Stable mild cardiomegaly and thoracic aortic atherosclerosis.   Mg 2.2, K 4.6. TSH 0.528. BNP 257.1. Tn <0.03 Pt INR has been elevated (3.59). Pharmacy believes due to drug interaction with Levaquin. Does adjusted with goal INR 2-3. Warfarin dose held.   Patient is currently experiencing nausea and sob at rest. Feels breathing is getting worse. She states she normally wears O2 at night but does not require it during the day. Currently on 4L. She  denies palpitations, chest pain or edema. She has not had any episodes of vomiting since this morning. No syncope. Denies recent travel or sick contacts. Last hospital admission >1 year.   Past Medical History:  Diagnosis Date  . Abdominal pain, other specified site    [place] - negative abdominal ultrasound  . Abnormal chest x-ray   . Chest pain 01/19/2009   myoview stress - LVEF >75% no reversible ischemia or infarction  . COPD (chronic obstructive pulmonary disease) ([place])   . Dyslipidemia   . Hypertension   . Paroxysmal atrial flutter ([place]) 01/19/2009   echo - EF 0000000; grade 1 diastolic dysfunction; mitral valve- calcified annlus, mildly dilated R ventricle and atrium  . Pneumonia    hx  . PONV (postoperative nausea and vomiting)   . Tremor, essential 09/07/2015      Surgical History:  Past Surgical History:  Procedure Laterality Date  . BREAST LUMPECTOMY WITH NEEDLE LOCALIZATION Right 10/02/2012   Procedure: BREAST LUMPECTOMY WITH NEEDLE LOCALIZATION;  Surgeon: Harl Bowie, MD;  Location: [place];  Service: General;  Laterality: Right;  . CATARACT EXTRACTION    . EYE SURGERY    . TONSILLECTOMY    . TONSILLECTOMY    . TUBAL LIGATION       Home Meds: Medication Sig  acetaminophen (TYLENOL) 325 MG tablet Take 650 mg by mouth every 6 (six) hours as needed for pain.  albuterol (PROAIR HFA) 108 (90 Base) MCG/ACT inhaler INHALE 2 PUFFS EVERY 6 HOURS AS NEEDED FOR WHEEZING OR SHORTNESS OF BREATH  ALPRAZolam (XANAX) 0.25 MG tablet TAKE 1 TABLET BY MOUTH THREE  TIMES DAILY AS NEEDED  amLODipine (NORVASC) 5 MG tablet Take 5 mg by mouth daily.  aspirin 81 MG tablet Take 81 mg by mouth daily.    carboxymethylcellulose (REFRESH PLUS) 0.5 % SOLN Place 1-2 drops into both eyes 2 (two) times daily.  losartan (COZAAR) 25 MG tablet TAKE 1 TABLET BY MOUTH EVERY DAY  OXYGEN Inhale 2 L into the lungs. Use @@ Bedtime  sotalol (BETAPACE) 80 MG tablet TAKE 1 TABLET BY MOUTH TWICE DAILY  tiotropium  (SPIRIVA HANDIHALER) 18 MCG inhalation capsule 1 inhale daily Patient taking differently: Place 18 mcg into inhaler and inhale daily.   warfarin (COUMADIN) 1 MG tablet TAKE 1 TO 2 TABLETS BY MOUTH EVERY DAY AS DIRECTED BY COUMADIN CLINIC Patient taking differently: 1 mg in the evening on Sun/Tues/Thurs/Sat and 2 mg on Mon/Wed/Fri  amLODipine (NORVASC) 2.5 MG tablet TAKE 1 TABLET BY MOUTH DAILY Patient not taking: Reported on 09/10/2016    Inpatient Medications:  . aspirin EC  81 mg Oral Daily  . ipratropium-albuterol  3 mL Nebulization Q6H  . [START ON 09/13/2016] levofloxacin  250 mg Oral Daily  . mouth rinse  15 mL Mouth Rinse BID  . methylPREDNISolone (SOLU-MEDROL) injection  60 mg Intravenous Q12H  . polyvinyl alcohol  1 drop Both Eyes TID  . sodium chloride flush  3 mL Intravenous Q12H  . sodium chloride flush  3 mL Intravenous Q12H  . Warfarin - Pharmacist Dosing Inpatient   Does not apply q1800   PRN Meds:.sodium chloride, acetaminophen, ALPRAZolam, hydrALAZINE, HYDROcodone-acetaminophen, ipratropium-albuterol, ondansetron **OR** ondansetron (ZOFRAN) IV, polyethylene glycol, sodium chloride flush   Allergies:  Allergies  Allergen Reactions  . Augmentin [Amoxicillin-Pot Clavulanate] Shortness Of Breath  . Doxycycline Shortness Of Breath  . Nitrofurantoin Shortness Of Breath  . Amiodarone Hcl Nausea And Vomiting  . Clarithromycin Other (See Comments)    Crazy feeling, confusion  . Clonazepam Other (See Comments)    Doesn't remember reaction  . Codeine Nausea And Vomiting  . Diltiazem Hcl Nausea And Vomiting  . Fish Oil Nausea And Vomiting  . Flecainide Nausea Only  . Multaq [Dronedarone] Nausea Only  . Rosuvastatin Other (See Comments)    Leg cramps   . Zetia [Ezetimibe] Other (See Comments)    Leg cramps    Social History   Social History  . Marital status: Married    Spouse name: Shanon Brow  . Number of children: 3  . Years of education: 12   Occupational History    . retired    Social History Main Topics  . Smoking status: Former Smoker    Packs/day: 2.00    Years: 25.00    Types: Cigarettes    Quit date: 08/15/1987  . Smokeless tobacco: Never Used  . Alcohol use No  . Drug use: No  . Sexual activity: Not on file   Other Topics Concern  . Not on file   Social History Narrative   Patient lives with her husband Shanon Brow).   Patient has 3 children.   Patient is right-handed.   Patient drinks 1 16 oz of Coke daily.   Patient is retired.   Patient has a high school education.     Family History  Problem Relation Age of Onset  . Bone cancer Mother   . Cancer Mother     bone  . Bone cancer Father   . Lung cancer Father   . Cancer Father     Lung  . Hyperlipidemia Father   .  Hypertension Father   . Hypertension Sister   . Cancer Sister     breast  . Hyperlipidemia Sister   . Hyperlipidemia Sister   . Alzheimer's disease Sister   . Hypertension Sister   . Hyperlipidemia Brother   . Hypertension Brother   . Breast cancer Sister      Review of Systems: General: negative for chills, fever, night sweats or weight changes.  Cardiovascular: Positive for SOB and DOE. Negative for chest pain, edema, orthopnea, palpitations, paroxysmal nocturnal dyspnea, shortness of breath or dyspnea on exertion Dermatological: negative for rash Respiratory: Positive for productive cough and wheezing. No hemoptysis.  Urologic: negative for hematuria Abdominal: Positive for nausea and vomiting. Negative for diarrhea, bright red blood per rectum, melena, or hematemesis Neurologic: negative for visual changes, syncope, or dizziness All other systems reviewed and are otherwise negative except as noted above.  Labs: Lab Results  Component Value Date   WBC 11.5 (H) 09/12/2016   HGB 13.3 09/12/2016   HCT 44.5 09/12/2016   MCV 92.7 09/12/2016   PLT 222 09/12/2016    Recent Labs Lab 09/09/16 0934  09/12/16 0501  NA 137  < > 143  K 4.2  < > 4.6  CL  102  < > 102  CO2 30  < > 33*  BUN 11  < > 35*  CREATININE 0.98  < > 1.04*  CALCIUM 9.1  < > 9.5  PROT 6.3*  --   --   BILITOT 0.8  --   --   ALKPHOS 78  --   --   ALT 14  --   --   AST 23  --   --   GLUCOSE 103*  < > 142*  < > = values in this interval not displayed. Lab Results  Component Value Date   TROPONINI <0.03 09/09/2016   Lab Results  Component Value Date   CHOL 224 (H) 07/31/2016   HDL 56 07/31/2016   LDLCALC 134 (H) 07/31/2016   TRIG 168 (H) 07/31/2016   BNP    Component Value Date/Time   BNP 257.1 (H) 09/09/2016 0934   Radiology/Studies:  Dg Chest 2 View Result Date: 09/09/2016 CLINICAL DATA:  Cough for 1 week, shortness of Breath EXAM: CHEST  2 VIEW COMPARISON:  01/31/2016 FINDINGS: There is hyperinflation of the lungs compatible with COPD. Scarring in the lingula and right upper lobe. Heart is upper limits normal in size. No acute airspace opacities or effusions. No acute bony abnormality. IMPRESSION: COPD/chronic changes.  No active disease. Electronically Signed   By: Rolm Baptise M.D.   On: 09/09/2016 09:29   Dg Chest Port 1 View Result Date: 09/12/2016 CLINICAL DATA:  Shortness of breath, abnormal lung sounds at the bases bilaterally. History of COPD, atrial flutter, CHF. EXAM: PORTABLE CHEST 1 VIEW COMPARISON:  PA and lateral chest x-ray of September 09, 2016 FINDINGS: The lungs remain mildly hyperinflated. The interstitial markings remain coarse especially in the right upper lobe. There is no alveolar infiltrate or pleural effusion. The cardiac silhouette is top-normal in size. The pulmonary vascularity is normal. There is calcification in the wall of the aortic arch. The trachea is midline. The bony thorax exhibits no acute abnormality. There are chronic changes of both shoulders. IMPRESSION: COPD.  Mild cardiomegaly.  No pneumonia nor pulmonary edema. Thoracic aortic atherosclerosis. Electronically Signed   By: David  Martinique M.D.   On: 09/12/2016 08:35    Wt  Readings from Last 3 Encounters:  09/12/16  59.6 kg (131 lb 6.4 oz)  09/06/16 61.8 kg (136 lb 3.2 oz)  07/27/16 61.3 kg (135 lb 3.2 oz)    EKG: Atrial fibrillation with RVR>>sinus rhythm w/ PAC and resulting bradycardia  Physical Exam: Blood pressure (!) 132/56, pulse 76, temperature 98.2 F (36.8 C), temperature source Oral, resp. rate 18, height 5\' 2"  (1.575 m), weight 59.6 kg (131 lb 6.4 oz), SpO2 99 %. Body mass index is 24.03 kg/m. General: Well developed, well nourished, in moderate respiratory distress Head: Normocephalic, atraumatic, sclera non-icteric, no xanthomas, nares are without discharge. Dentition: poor  Neck: Negative for carotid bruits. JVD elevated 10 cm Lungs: Diminished breath sounds throughout. Expiratory wheeze.  Heart: Irregularly irregular with S1 S2. 1/6 systolic ejection murmur. No rubs, or gallops appreciated. Abdomen: Soft, non-tender, non-distended with normoactive bowel sounds. No hepatomegaly. No rebound/guarding. No obvious abdominal masses. Msk:  Strength and tone appear normal for age. Extremities: No clubbing or cyanosis. No edema.  Distal pedal pulses are 2+ and equal bilaterally. Neuro: Alert and oriented X 3. No facial asymmetry. No focal deficit. Moves all extremities spontaneously. Psych:  Responds to questions appropriately with a normal affect.     Assessment and Plan  Claudia [name] is a 83 y.o. female with history of paroxysmal atrial fibrillation on chronic warfarin, COPD with chronic hypoxic respiratory failure, chronic diastolic heart failure, and HTN who was transferred from [facility] for COPD excerebration.    1) Atrial Fibrillation with bradycardia and RVR History of  paroxysmal atrial flutter and fibrillation and sob at rest.  Previously with flecainide and amiodarone without tolerating them Prior to admission to the hospital tolerating Sotalol and warfarin for tx.  01/30, the patient woke up with severe nausea and 3 episodes of vomiting.  Pts HR fell to Sinus brady in the 30-40s after event, BP stable. HR returned to afib RVR in the 140-150's. Without intervention, the patient HR dropped to sinus brady in 30-60s again.   Possible vasovagal response from N/V initially.  Metoprolol and Sotalol held by IM. Agree.  Several episodes of Loletha Grayer on tele since event. Currently NSR in 80's. Pt does not endorse being symptomatic other than nausea.  Consider Levalbuterol for future breathing treatments.  Continue Warfarin per Pharmacy. Pt INR has been elevated (3.59). Pharmacy believes due to drug interaction with Levaquin. Goal INR 2-3. CHADVASc 5 (agex2, gender, HTN, CHF).  CXR after event showed no evidence of acute changes. Stable mild cardiomegaly and thoracic aortic atherosclerosis.   Echo ordered. Pending result.  Mg 2.2, K 4.6. TSH 0.528. Tn <0.03 Vasovagal vs. Sick sinus syndrome? Continue monitor on telemetry. Intolerant flecainide, amiodarone. Possible failure of Sotalol. Pacemaker may be consideration in future if not isolated event and she becomes symptomatic from bradycardia.    2) Chronic diastolic CHF  Past Echo showed EF 55-60%, normal wall motion, G1DD and mild right atrium dilatation.  Noted to have SOB at baseline with home O2 only QHS.  + volume overload on exam with decreased BS bases and ++JVD but no CHF on CXR this am Echo ordered. Pending results.  Wt down 3+lbs since admission.  BNP 257.1  3) HTN At home Norvasc and losartan. Held per IM as patients BP is soft.  Adjustment outp prn.   Lenoard Aden 09/12/2016 4:51 PM Beeper [phone]

## 2016-09-12 NOTE — Progress Notes (Signed)
PHARMACY NOTE:  ANTIMICROBIAL RENAL DOSAGE ADJUSTMENT  Current antimicrobial regimen includes a mismatch between antimicrobial dosage and estimated renal function.  As per policy approved by the Pharmacy & Therapeutics and Medical Executive Committees, the antimicrobial dosage will be adjusted accordingly.  Current antimicrobial dosage:  levaquin 500mg  PO daily  Indication: COPD exacerbation  Renal Function:  Estimated Creatinine Clearance: 33.6 mL/min (by C-G formula based on SCr of 1.04 mg/dL (H)). []      On intermittent HD, scheduled: []      On CRRT    Antimicrobial dosage has been changed to:  levaquin 250mg  PO daily  Additional comments:   Thank you for allowing pharmacy to be a part of this patient's care.  Claudia [name], Claudia [name], Claudia [name] 09/12/2016 11:08 AM

## 2016-09-12 NOTE — Progress Notes (Signed)
Change in rhythm from NSR to SB. Notified triad MD, waiting for call back. Pt asymptomatic

## 2016-09-12 NOTE — Progress Notes (Signed)
Pt HR has been in the 12's to 130's. Pt asymptomatic. VS taken. EKG done. Lopressor 2.5 mg iv given. Notified [facility] service regarding elevated HR. Waiting for MD call back. Monitored pt closely. Pt stated. "Had this before my heart rate goes up sometimes."

## 2016-09-13 ENCOUNTER — Inpatient Hospital Stay (HOSPITAL_COMMUNITY): Payer: Medicare Other

## 2016-09-13 DIAGNOSIS — I4891 Unspecified atrial fibrillation: Secondary | ICD-10-CM

## 2016-09-13 DIAGNOSIS — I5032 Chronic diastolic (congestive) heart failure: Secondary | ICD-10-CM

## 2016-09-13 DIAGNOSIS — R0600 Dyspnea, unspecified: Secondary | ICD-10-CM

## 2016-09-13 DIAGNOSIS — R0603 Acute respiratory distress: Secondary | ICD-10-CM

## 2016-09-13 DIAGNOSIS — I495 Sick sinus syndrome: Secondary | ICD-10-CM | POA: Diagnosis present

## 2016-09-13 DIAGNOSIS — J9622 Acute and chronic respiratory failure with hypercapnia: Secondary | ICD-10-CM

## 2016-09-13 DIAGNOSIS — R0902 Hypoxemia: Secondary | ICD-10-CM | POA: Diagnosis present

## 2016-09-13 LAB — BLOOD GAS, ARTERIAL
Acid-Base Excess: 9.9 mmol/L — ABNORMAL HIGH (ref 0.0–2.0)
Bicarbonate: 36 mmol/L — ABNORMAL HIGH (ref 20.0–28.0)
DRAWN BY: 441371
O2 Content: 3 L/min
O2 Saturation: 98.7 %
PH ART: 7.325 — AB (ref 7.350–7.450)
Patient temperature: 98.6
pCO2 arterial: 71 mmHg (ref 32.0–48.0)
pO2, Arterial: 127 mmHg — ABNORMAL HIGH (ref 83.0–108.0)

## 2016-09-13 LAB — CBC
HCT: 46 % (ref 36.0–46.0)
HEMOGLOBIN: 13.4 g/dL (ref 12.0–15.0)
MCH: 27.9 pg (ref 26.0–34.0)
MCHC: 29.1 g/dL — AB (ref 30.0–36.0)
MCV: 95.6 fL (ref 78.0–100.0)
PLATELETS: 174 10*3/uL (ref 150–400)
RBC: 4.81 MIL/uL (ref 3.87–5.11)
RDW: 14.5 % (ref 11.5–15.5)
WBC: 9.1 10*3/uL (ref 4.0–10.5)

## 2016-09-13 LAB — BASIC METABOLIC PANEL
Anion gap: 7 (ref 5–15)
BUN: 37 mg/dL — AB (ref 6–20)
CHLORIDE: 100 mmol/L — AB (ref 101–111)
CO2: 34 mmol/L — ABNORMAL HIGH (ref 22–32)
Calcium: 9.3 mg/dL (ref 8.9–10.3)
Creatinine, Ser: 0.98 mg/dL (ref 0.44–1.00)
GFR calc Af Amer: >60 mL/min (ref >60)
GFR calc non Af Amer: 53 mL/min — ABNORMAL LOW (ref >60)
GLUCOSE: 124 mg/dL — AB (ref 65–99)
POTASSIUM: 4.9 mmol/L (ref 3.5–5.1)
Sodium: 141 mmol/L (ref 135–145)

## 2016-09-13 LAB — MRSA PCR SCREENING: MRSA by PCR: NEGATIVE

## 2016-09-13 LAB — ECHOCARDIOGRAM COMPLETE
Height: 62 in
WEIGHTICAEL: 2127 [oz_av]

## 2016-09-13 LAB — PROTIME-INR
INR: 3.32
Prothrombin Time: 34.5 seconds — ABNORMAL HIGH (ref 11.4–15.2)

## 2016-09-13 MED ORDER — DEXTROSE 50 % IV SOLN
INTRAVENOUS | Status: AC
  Filled 2016-09-13: qty 50

## 2016-09-13 MED ORDER — CHLORHEXIDINE GLUCONATE 0.12 % MT SOLN
15.0000 mL | Freq: Two times a day (BID) | OROMUCOSAL | Status: DC
  Administered 2016-09-14 – 2016-09-15 (×3): 15 mL via OROMUCOSAL
  Filled 2016-09-13 (×3): qty 15

## 2016-09-13 MED ORDER — METOPROLOL TARTRATE 12.5 MG HALF TABLET
12.5000 mg | ORAL_TABLET | Freq: Once | ORAL | Status: AC
  Administered 2016-09-13: 12.5 mg via ORAL
  Filled 2016-09-13: qty 1

## 2016-09-13 MED ORDER — LEVALBUTEROL HCL 0.63 MG/3ML IN NEBU
0.6300 mg | INHALATION_SOLUTION | Freq: Four times a day (QID) | RESPIRATORY_TRACT | Status: DC
  Administered 2016-09-13 – 2016-09-17 (×13): 0.63 mg via RESPIRATORY_TRACT
  Filled 2016-09-13 (×14): qty 3

## 2016-09-13 MED ORDER — ORAL CARE MOUTH RINSE
15.0000 mL | Freq: Two times a day (BID) | OROMUCOSAL | Status: DC
  Administered 2016-09-13 – 2016-09-15 (×3): 15 mL via OROMUCOSAL

## 2016-09-13 MED ORDER — ESMOLOL HCL-SODIUM CHLORIDE 2000 MG/100ML IV SOLN
25.0000 ug/kg/min | INTRAVENOUS | Status: DC
  Administered 2016-09-13: 25 ug/kg/min via INTRAVENOUS
  Administered 2016-09-13 – 2016-09-15 (×9): 100 ug/kg/min via INTRAVENOUS
  Administered 2016-09-16: 200 ug/kg/min via INTRAVENOUS
  Administered 2016-09-16: 100 ug/kg/min via INTRAVENOUS
  Filled 2016-09-13 (×14): qty 100

## 2016-09-13 MED ORDER — PANTOPRAZOLE SODIUM 40 MG PO TBEC
40.0000 mg | DELAYED_RELEASE_TABLET | Freq: Every day | ORAL | Status: DC
  Administered 2016-09-14 – 2016-09-30 (×17): 40 mg via ORAL
  Filled 2016-09-13 (×17): qty 1

## 2016-09-13 NOTE — Progress Notes (Signed)
Pt. HR in A-fib in the 130's-150's.  Esmolol gtt at 100 mcg/kg/min.  Cards called and said not to increase over 100 mcg due to tachybradycardia syndrome.  Pt. Is asymptomatic and BP is stable. Will continue to monitor closely.

## 2016-09-13 NOTE — Progress Notes (Signed)
RT assisted in patient transport from 4E to 4N29 on 3 L O2. Patient's vitals remained stable throughout. Patient placed on Bipap per Dr Pura Spice reqiest with a Serv[facility] when we arrived to the ICU. Report given to assigned RT.

## 2016-09-13 NOTE — Progress Notes (Addendum)
Pt HR in the 130-150's. Cardiology on call Md paged. MD ordered one time dose of lopressor 12.5mg  PO. Will continue to monitor.

## 2016-09-13 NOTE — Consult Note (Signed)
PULMONARY / CRITICAL CARE MEDICINE   Name: Claudia [name] MRN: [id number] DOB: 1932/08/16    ADMISSION DATE:  09/09/2016 CONSULTATION DATE:  09/13/16  REFERRING MD:  Ree Kida, MD  CHIEF COMPLAINT:  Shortness of breath  HISTORY OF PRESENT ILLNESS:   Claudia [name] is a 83yo woman with PMHx of paroxysmal AFib on warfarin, COPD, chronic diastolic HF, and HTN admitted on 1/27 for shortness of breath and cough found to be hypoxic at 76% on room air. Dyspnea and cough attributed to COPD exacerbation. She was noted to be in AFib with RVR on 1/28 and responded to scheduled sotalol and IV metoprolol. On 1/30, patient's HR fell into the 30's but then without intervention went back into AFib with RVR with HR in 150s. Cardiology consulted on 1/30 with concerns of tachy-brady syndrome. She then developed respiratory distress last night and was placed on BiPAP. Patient transferred to ICU to manage her tachy-brady and respiratory failure.   Patient reports her SOB is better on BiPAP. Denies any chest pain or abdominal pain. Does note some nausea. Denies any fevers, cough, myalgias at home.   PAST MEDICAL HISTORY :  She  has a past medical history of Abdominal pain, other specified site; Abnormal chest x-ray; Chest pain (01/19/2009); COPD (chronic obstructive pulmonary disease) ([place]); Dyslipidemia; Hypertension; Paroxysmal atrial flutter ([place]) (01/19/2009); Pneumonia; PONV (postoperative nausea and vomiting); and Tremor, essential (09/07/2015).  PAST SURGICAL HISTORY: She  has a past surgical history that includes Tonsillectomy; Cataract extraction; Tubal ligation; Tonsillectomy; Eye surgery; and Breast lumpectomy with needle localization (Right, 10/02/2012).  Allergies  Allergen Reactions  . Augmentin [Amoxicillin-Pot Clavulanate] Shortness Of Breath  . Doxycycline Shortness Of Breath  . Nitrofurantoin Shortness Of Breath  . Amiodarone Hcl Nausea And Vomiting  . Clarithromycin Other (See Comments)    Crazy  feeling, confusion  . Clonazepam Other (See Comments)    Doesn't remember reaction  . Codeine Nausea And Vomiting  . Diltiazem Hcl Nausea And Vomiting  . Fish Oil Nausea And Vomiting  . Flecainide Nausea Only  . Multaq [Dronedarone] Nausea Only  . Rosuvastatin Other (See Comments)    Leg cramps   . Zetia [Ezetimibe] Other (See Comments)    Leg cramps    No current facility-administered medications on file prior to encounter.    Current Outpatient Prescriptions on File Prior to Encounter  Medication Sig  . acetaminophen (TYLENOL) 325 MG tablet Take 650 mg by mouth every 6 (six) hours as needed for pain.  Marland Kitchen albuterol (PROAIR HFA) 108 (90 Base) MCG/ACT inhaler INHALE 2 PUFFS EVERY 6 HOURS AS NEEDED FOR WHEEZING OR SHORTNESS OF BREATH  . ALPRAZolam (XANAX) 0.25 MG tablet TAKE 1 TABLET BY MOUTH THREE TIMES DAILY AS NEEDED  . aspirin 81 MG tablet Take 81 mg by mouth daily.    Marland Kitchen losartan (COZAAR) 25 MG tablet TAKE 1 TABLET BY MOUTH EVERY DAY  . OXYGEN Inhale 2 L into the lungs. Use @@ Bedtime  . sotalol (BETAPACE) 80 MG tablet TAKE 1 TABLET BY MOUTH TWICE DAILY  . tiotropium (SPIRIVA HANDIHALER) 18 MCG inhalation capsule 1 inhale daily (Patient taking differently: Place 18 mcg into inhaler and inhale daily. )  . warfarin (COUMADIN) 1 MG tablet TAKE 1 TO 2 TABLETS BY MOUTH EVERY DAY AS DIRECTED BY COUMADIN CLINIC (Patient taking differently: 1 mg in the evening on Sun/Tues/Thurs/Sat and 2 mg on Mon/Wed/Fri)  . amLODipine (NORVASC) 2.5 MG tablet TAKE 1 TABLET BY MOUTH DAILY (Patient not taking: Reported on  09/10/2016)    FAMILY HISTORY:  Her indicated that her mother is deceased. She indicated that her father is deceased. She indicated that four of her five sisters are alive. She indicated that her brother is alive.    SOCIAL HISTORY: She  reports that she quit smoking about 29 years ago. Her smoking use included Cigarettes. She has a 50.00 pack-year smoking history. She has never used  smokeless tobacco. She reports that she does not drink alcohol or use drugs.  REVIEW OF SYSTEMS:   All negative except per HPI  SUBJECTIVE:  Reports SOB is better on BiPAP. Denies any chest pain or abdominal pain. Does note some nausea. Denies any fevers, cough, myalgias at home.    VITAL SIGNS: BP (!) 130/91 (BP Location: Left Arm)   Pulse (!) 135   Temp 97.7 F (36.5 C) (Axillary)   Resp 20   Ht 5\' 2"  (1.575 m)   Wt 132 lb 15 oz (60.3 kg)   SpO2 97%   BMI 24.31 kg/m   HEMODYNAMICS:    VENTILATOR SETTINGS: Vent Mode: BIPAP FiO2 (%):  [40 %] 40 % Set Rate:  [15 bmp] 15 bmp PEEP:  [5 cmH20-6 cmH20] 6 cmH20  INTAKE / OUTPUT: I/O last 3 completed shifts: In: 489 [P.O.:480; I.V.:9] Out: 725 [Urine:725]  PHYSICAL EXAMINATION: General:  Elderly woman resting in bed, NAD Neuro: alert and oriented x 3 HEENT:  [place]/AT, EOMI, PERRL Cardiovascular:  RRR, not in AFib currently, HRs in 80s, no m/g/r Lungs: CTA bilaterally, breaths non-labored on BiPAP Abdomen:  BS+, soft, non-tender Musculoskeletal:  Moves all extremities, no peripheral edema Skin:  Warm, dry  LABS:  BMET  Recent Labs Lab 09/11/16 0311 09/12/16 0501 09/13/16 0405  NA 142 143 141  K 4.1 4.6 4.9  CL 103 102 100*  CO2 29 33* 34*  BUN 20 35* 37*  CREATININE 0.99 1.04* 0.98  GLUCOSE 133* 142* 124*    Electrolytes  Recent Labs Lab 09/11/16 0311 09/12/16 0501 09/13/16 0405  CALCIUM 9.5 9.5 9.3  MG  --  2.2  --     CBC  Recent Labs Lab 09/11/16 0311 09/12/16 0501 09/13/16 0405  WBC 8.2 11.5* 9.1  HGB 13.2 13.3 13.4  HCT 42.5 44.5 46.0  PLT 182 222 174    Coag's  Recent Labs Lab 09/11/16 0311 09/12/16 0501 09/13/16 0405  INR 3.13 3.59 3.32    Sepsis Markers No results for input(s): LATICACIDVEN, PROCALCITON, O2SATVEN in the last 168 hours.  ABG  Recent Labs Lab 09/12/16 1740 09/12/16 2105 09/13/16 0825  PHART 7.235* 7.285* 7.325*  PCO2ART 90.1* 76.0* 71.0*  PO2ART  105 84.6 127*    Liver Enzymes  Recent Labs Lab 09/09/16 0934  AST 23  ALT 14  ALKPHOS 78  BILITOT 0.8  ALBUMIN 4.0    Cardiac Enzymes  Recent Labs Lab 09/09/16 0934  TROPONINI <0.03    Glucose  Recent Labs Lab 09/10/16 0641 09/11/16 0547 09/12/16 0555  GLUCAP 101* 121* 130*    Imaging Dg Chest Port 1 View  Result Date: 09/12/2016 CLINICAL DATA:  Shortness of breath. EXAM: PORTABLE CHEST 1 VIEW COMPARISON:  Radiograph of same day. FINDINGS: Stable cardiomediastinal silhouette is noted. No pneumothorax or pleural effusion is noted. No acute pulmonary disease is noted. Stable bilateral lung scarring is noted. No acute pulmonary disease is noted. Bony thorax is unremarkable. IMPRESSION: No acute cardiopulmonary abnormality seen. Electronically Signed   By: Marijo Conception, M.D.   On:  09/12/2016 19:45     STUDIES:  CXR 1/30> COPD changes, no acute abnormalities   CULTURES: RVP 1/28> negative  ANTIBIOTICS: None  SIGNIFICANT EVENTS: 1/27> Admitted with dyspnea and hypoxic respiratory failure 1/28> AFib with RVR, responded to metoprolol, sotalol 1/30>Bradycardia in 30s, then AFib with RVR in 150s 1/31> Transferred to ICU due to respiratory distress, tachy-brady  LINES/TUBES: PIV 1/27>  DISCUSSION:   ASSESSMENT / PLAN:  PULMONARY A: Acute hypoxic and hypercarbic respiratory failure COPD exacerbation  P:   BiPAP PRN Transition to oxygen via [place], aim for sats in 88-92% range Duonebs Q6H Solumedrol 60 q12h, decrease gradually  CARDIOVASCULAR A:  Hx paroxysmal AFib, now with RVR and bradycardia (tachy-brady) Diastolic CHF HTN P:  Esmolol gtt per cards Likely will need PM, EP consulted but will defer to cardiology Warfarin for AF Cardiac monitoring  RENAL A:   Metabolic alkalosis- likely reactive to respiratory acidosis P:   Follow bmet Trend UOP Replace lytes as needed  GASTROINTESTINAL A:   NPO while on BiPAP GI PPx P:   Heart  healthy diet when off BiPAP PPI with steroids  HEMATOLOGIC A:   No acute issues P:  CBC intermittently Monitor for bleeding Warfarin for [facility]   INFECTIOUS A:   No evidence of infection P:   Continue to monitor   ENDOCRINE A:   No acute issues P:   Monitor glucose on bmet Add SSI if glucose >180 consistently  NEUROLOGIC A:   No acute issues A&O x 3 P:   Continue to monitor   FAMILY  - Updates: Husband updated at bedside  Patient states she has a living will and does not wish to be intubated. Husband aware and agrees. Will have attending rediscuss code status and make DNR if aligns with patient's wishes.   Albin Felling, MD, MPH Internal Medicine Resident, PGY-III Pager: [phone] 09/13/2016, 10:43 AM  Attending Note:  83 year old female with extensive PMH who presents to PCCM with acute on chronic respiratory failure due to hypercarbia from COPD as well as a-fib with RVR who is requiring BiPAP and esmolol drips for control of both respectively.  On exam, mental status is great with decreased BS diffusely.  I reviewed CXR myself, hyperinflation but no acute disease noted.  Will transfer to the ICU, continue BiPAP.  Start esmolol drip for HR control.  Will likely need EP to see patient.  Continue steroids.  Duonebs.  PRN albuterol ordered.  Warfrin for a-fib.  Tele monitoring.  PCCM will assume care of the patient.  The patient is critically ill with multiple organ systems failure and requires high complexity decision making for assessment and support, frequent evaluation and titration of therapies, application of advanced monitoring technologies and extensive interpretation of multiple databases.   Critical Care Time devoted to patient care services described in this note is  35  Minutes. This time reflects time of care of this signee Dr Jennet Maduro. This critical care time does not reflect procedure time, or teaching time or supervisory time of PA/NP/Med student/Med  Resident etc but could involve care discussion time.  Rush Farmer, M.D. [facility] Pulmonary/Critical Care Medicine. Pager: [phone]. After hours pager: [phone].

## 2016-09-13 NOTE — Progress Notes (Signed)
Page by the nursing staff that patient's heart rate continued to be elevated in the 140s. She has tachybradycardia syndrome. I will see increase esmolol slightly to 75 mcg/kg/m. Later if her heart rate still is very difficult to control, with initially consider increasing it to 100 mcg/kg/m, however I would not try to increase it beyond that due to fear of severe bradycardia. If her heart rate is still uncontrolled despite this, since she is asymptomatic, we may have to wait until tomorrow for Tikosyn loading once sotalol was washed out.  Hilbert Corrigan PA Pager: [phone]

## 2016-09-13 NOTE — Progress Notes (Signed)
ANTICOAGULATION CONSULT NOTE - Follow Up Consult  Pharmacy Consult for warfarin Indication: atrial fibrillation  Allergies  Allergen Reactions  . Augmentin [Amoxicillin-Pot Clavulanate] Shortness Of Breath  . Doxycycline Shortness Of Breath  . Nitrofurantoin Shortness Of Breath  . Amiodarone Hcl Nausea And Vomiting  . Clarithromycin Other (See Comments)    Crazy feeling, confusion  . Clonazepam Other (See Comments)    Doesn't remember reaction  . Codeine Nausea And Vomiting  . Diltiazem Hcl Nausea And Vomiting  . Fish Oil Nausea And Vomiting  . Flecainide Nausea Only  . Multaq [Dronedarone] Nausea Only  . Rosuvastatin Other (See Comments)    Leg cramps   . Zetia [Ezetimibe] Other (See Comments)    Leg cramps    Patient Measurements: Height: 5\' 2"  (157.5 cm) Weight: 132 lb 15 oz (60.3 kg) IBW/kg (Calculated) : 50.1  Vital Signs: Temp: 97.9 F (36.6 C) (01/31 1138) Temp Source: Axillary (01/31 1138) BP: 130/91 (01/31 0810) Pulse Rate: 152 (01/31 1045)  Labs:  Recent Labs  09/11/16 0311 09/12/16 0501 09/13/16 0405  HGB 13.2 13.3 13.4  HCT 42.5 44.5 46.0  PLT 182 222 174  LABPROT 32.9* 36.7* 34.5*  INR 3.13 3.59 3.32  CREATININE 0.99 1.04* 0.98    Estimated Creatinine Clearance: 38.5 mL/min (by C-G formula based on SCr of 0.98 mg/dL).  Assessment: 84 YOF admitted for SOB on PTA warfarin for Afib. Pharmacy consulted to dose warfarin. INR has trended down slightly from 3.59 to 3.32 but remains supratherapeutic. May have been elevated d/t interacting w/ Levaquin which is now discontinued as of 1/30. No bleeding per RN.  PTA warfarin dose is 2mg  MWF, 1mg  all other days  Goal of Therapy:  INR 2-3 Monitor platelets by anticoagulation protocol: Yes   Plan:  Hold warfarin tonight Daily INR Monitor s/sx bleeding  Cheral Almas, PharmD Candidate 09/13/2016,11:57 AM

## 2016-09-13 NOTE — Progress Notes (Signed)
Spoke with patient regarding code status.  Evidently she has a living will that states DNR.  Will change code status.  Rush Farmer, M.D. [facility] Pulmonary/Critical Care Medicine. Pager: [phone]. After hours pager: [phone].

## 2016-09-13 NOTE — Care Management Note (Addendum)
Case Management Note  Patient Details  Name: Claudia [name] MRN: [id number] Date of Birth: 1932/11/04  Subjective/Objective:     Adm w resp distress               Action/Plan: lives at home   Expected Discharge Date:                  Expected Discharge Plan:  Home/Self Care  In-House Referral:     Discharge planning Services  CM Consult  Post Acute Care Choice:  Durable Medical Equipment Choice offered to:     DME Arranged:  Nebulizer/meds DME Agency:  Adult and pediatric specailists  HH Arranged:    [place] Agency:     Status of Service:  In process, will continue to follow  If discussed at Long Length of Stay Meetings, dates discussed:    Additional Comments:alerted adult and pediatric spec that pt has order for neb machine. Pt will need prescription for neb meds and get those at pharmacy.S/W MARGIE @ [facility] [employer] RX # [phone]   TIKOSYN  500 MCG  NONE FORMULARY  PRIOR APPROVAL YES # [phone] OPT- 5   PREFERRED :  DOFETILIDE 500 MCG  COVER- YES  CO-PAY- 45 % OF COAST  TIER- 4 DRUG  PRIOR APPROVAL- NO   PHARMACY : Aloha Gell, RN 09/13/2016, 11:07 AM

## 2016-09-13 NOTE — Progress Notes (Signed)
Report called to Janett Billow, receiving RN on  [place]. Pt in afib/flutter and needs esmolol gtt.  Transferred to 4n29 via bed with respiratory on bipap. Pt has personal belongings. Called patient's family member with new room number and update. Spoke to daughter, husband did not answer.  [facility]

## 2016-09-13 NOTE — Progress Notes (Addendum)
DAILY PROGRESS NOTE  Subjective:  Much more awake today - she went back into a-fib with RVR. Struggled with bradycardia yesterday. PCO2 71 today - down from 90 yesterday, pH now up to 7.325. Total 1.5L negative.  Objective:  Temp:  [97.4 F (36.3 C)-98.2 F (36.8 C)] 97.7 F (36.5 C) (01/31 0735) Pulse Rate:  [53-144] 135 (01/31 0810) Resp:  [18-31] 19 (01/31 0810) BP: (113-150)/(38-91) 130/91 (01/31 0810) SpO2:  [86 %-100 %] 97 % (01/31 0810) FiO2 (%):  [40 %] 40 % (01/31 0301) Weight:  [132 lb 15 oz (60.3 kg)-135 lb 5.8 oz (61.4 kg)] 132 lb 15 oz (60.3 kg) (01/31 0439) Weight change: 3 lb 15.4 oz (1.797 kg)  Intake/Output from previous day: 01/30 0701 - 01/31 0700 In: 129 [P.O.:120; I.V.:9] Out: 550 [Urine:550]  Intake/Output from this shift: Total I/O In: -  Out: 100 [Urine:100]  Medications: No current facility-administered medications on file prior to encounter.    Current Outpatient Prescriptions on File Prior to Encounter  Medication Sig Dispense Refill  . acetaminophen (TYLENOL) 325 MG tablet Take 650 mg by mouth every 6 (six) hours as needed for pain.    Marland Kitchen albuterol (PROAIR HFA) 108 (90 Base) MCG/ACT inhaler INHALE 2 PUFFS EVERY 6 HOURS AS NEEDED FOR WHEEZING OR SHORTNESS OF BREATH 8.5 g 12  . ALPRAZolam (XANAX) 0.25 MG tablet TAKE 1 TABLET BY MOUTH THREE TIMES DAILY AS NEEDED 90 tablet 5  . aspirin 81 MG tablet Take 81 mg by mouth daily.      Marland Kitchen losartan (COZAAR) 25 MG tablet TAKE 1 TABLET BY MOUTH EVERY DAY 90 tablet 3  . OXYGEN Inhale 2 L into the lungs. Use @@ Bedtime    . sotalol (BETAPACE) 80 MG tablet TAKE 1 TABLET BY MOUTH TWICE DAILY 60 tablet 5  . tiotropium (SPIRIVA HANDIHALER) 18 MCG inhalation capsule 1 inhale daily (Patient taking differently: Place 18 mcg into inhaler and inhale daily. ) 30 capsule 12  . warfarin (COUMADIN) 1 MG tablet TAKE 1 TO 2 TABLETS BY MOUTH EVERY DAY AS DIRECTED BY COUMADIN CLINIC (Patient taking differently: 1 mg in the  evening on Sun/Tues/Thurs/Sat and 2 mg on Mon/Wed/Fri) 135 tablet 1  . amLODipine (NORVASC) 2.5 MG tablet TAKE 1 TABLET BY MOUTH DAILY (Patient not taking: Reported on 09/10/2016) 30 tablet 10    Physical Exam: General appearance: alert and no distress Lungs: diminished breath sounds bilaterally Heart: irregularly irregular rhythm and tachycardic Extremities: extremities normal, atraumatic, no cyanosis or edema Neurologic: Mental status: Alert, oriented, thought content appropriate  Lab Results: Results for orders placed or performed during the hospital encounter of 09/09/16 (from the past 48 hour(s))  Protime-INR     Status: Abnormal   Collection Time: 09/12/16  5:01 AM  Result Value Ref Range   Prothrombin Time 36.7 (H) 11.4 - 15.2 seconds   INR 3.59   CBC     Status: Abnormal   Collection Time: 09/12/16  5:01 AM  Result Value Ref Range   WBC 11.5 (H) 4.0 - 10.5 K/uL   RBC 4.80 3.87 - 5.11 MIL/uL   Hemoglobin 13.3 12.0 - 15.0 g/dL   HCT 44.5 36.0 - 46.0 %   MCV 92.7 78.0 - 100.0 fL   MCH 27.7 26.0 - 34.0 pg   MCHC 29.9 (L) 30.0 - 36.0 g/dL   RDW 14.3 11.5 - 15.5 %   Platelets 222 150 - 400 K/uL  Basic metabolic panel     Status:  Abnormal   Collection Time: 09/12/16  5:01 AM  Result Value Ref Range   Sodium 143 135 - 145 mmol/L   Potassium 4.6 3.5 - 5.1 mmol/L   Chloride 102 101 - 111 mmol/L   CO2 33 (H) 22 - 32 mmol/L   Glucose, Bld 142 (H) 65 - 99 mg/dL   BUN 35 (H) 6 - 20 mg/dL   Creatinine, Ser 1.04 (H) 0.44 - 1.00 mg/dL   Calcium 9.5 8.9 - 10.3 mg/dL   GFR calc non Af Amer 49 (L) >60 mL/min   GFR calc Af Amer 57 (L) >60 mL/min    Comment: (NOTE) The eGFR has been calculated using the CKD EPI equation. This calculation has not been validated in all clinical situations. eGFR's persistently <60 mL/min signify possible Chronic Kidney Disease.    Anion gap 8 5 - 15  Magnesium     Status: None   Collection Time: 09/12/16  5:01 AM  Result Value Ref Range   Magnesium  2.2 1.7 - 2.4 mg/dL  Glucose, capillary     Status: Abnormal   Collection Time: 09/12/16  5:55 AM  Result Value Ref Range   Glucose-Capillary 130 (H) 65 - 99 mg/dL  TSH     Status: None   Collection Time: 09/12/16 11:16 AM  Result Value Ref Range   TSH 0.528 0.350 - 4.500 uIU/mL    Comment: Performed by a 3rd Generation assay with a functional sensitivity of <=0.01 uIU/mL.  Blood gas, arterial     Status: Abnormal   Collection Time: 09/12/16  5:40 PM  Result Value Ref Range   O2 Content 2.0 L/min   Delivery systems NASAL CANNULA    pH, Arterial 7.235 (L) 7.350 - 7.450   pCO2 arterial 90.1 (HH) 32.0 - 48.0 mmHg    Comment: CRITICAL RESULT CALLED TO, READ BACK BY AND VERIFIED WITH: HELLE LAYTON,RN AT 1751 BY KATEY FARGIS,RRT,RCP ON 09/12/16    pO2, Arterial 105 83.0 - 108.0 mmHg   Bicarbonate 36.9 (H) 20.0 - 28.0 mmol/L   Acid-Base Excess 9.5 (H) 0.0 - 2.0 mmol/L   O2 Saturation 97.2 %   Patient temperature 98.6    Collection site LEFT RADIAL    Drawn by [id number]    Sample type ARTERIAL DRAW    Allens test (pass/fail) PASS PASS  Blood gas, arterial     Status: Abnormal   Collection Time: 09/12/16  9:05 PM  Result Value Ref Range   FIO2 0.40    Delivery systems BILEVEL POSITIVE AIRWAY PRESSURE    Mode BILEVEL POSITIVE AIRWAY PRESSURE    LHR 15 resp/min   Inspiratory PAP 10    Expiratory PAP 5    pH, Arterial 7.285 (L) 7.350 - 7.450   pCO2 arterial 76.0 (HH) 32.0 - 48.0 mmHg    Comment: CRITICAL RESULT CALLED TO, READ BACK BY AND VERIFIED WITH: LEE WORLEY RN, AT 2115 BY Gwyndolyn Saxon BROWN RRT, RCP ON 09/12/2016    pO2, Arterial 84.6 83.0 - 108.0 mmHg   Bicarbonate 35.0 (H) 20.0 - 28.0 mmol/L   Acid-Base Excess 8.4 (H) 0.0 - 2.0 mmol/L   O2 Saturation 96.1 %   Patient temperature 98.6    Collection site RIGHT RADIAL    Drawn by [id number]    Sample type ARTERIAL DRAW    Allens test (pass/fail) PASS PASS  MRSA PCR Screening     Status: None   Collection Time: 09/13/16  1:53 AM    Result Value Ref Range  MRSA by PCR NEGATIVE NEGATIVE    Comment:        The GeneXpert MRSA Assay (FDA approved for NASAL specimens only), is one component of a comprehensive MRSA colonization surveillance program. It is not intended to diagnose MRSA infection nor to guide or monitor treatment for MRSA infections.   Protime-INR     Status: Abnormal   Collection Time: 09/13/16  4:05 AM  Result Value Ref Range   Prothrombin Time 34.5 (H) 11.4 - 15.2 seconds   INR 3.32   CBC     Status: Abnormal   Collection Time: 09/13/16  4:05 AM  Result Value Ref Range   WBC 9.1 4.0 - 10.5 K/uL   RBC 4.81 3.87 - 5.11 MIL/uL   Hemoglobin 13.4 12.0 - 15.0 g/dL   HCT 46.0 36.0 - 46.0 %   MCV 95.6 78.0 - 100.0 fL   MCH 27.9 26.0 - 34.0 pg   MCHC 29.1 (L) 30.0 - 36.0 g/dL   RDW 14.5 11.5 - 15.5 %   Platelets 174 150 - 400 K/uL  Basic metabolic panel     Status: Abnormal   Collection Time: 09/13/16  4:05 AM  Result Value Ref Range   Sodium 141 135 - 145 mmol/L   Potassium 4.9 3.5 - 5.1 mmol/L   Chloride 100 (L) 101 - 111 mmol/L   CO2 34 (H) 22 - 32 mmol/L   Glucose, Bld 124 (H) 65 - 99 mg/dL   BUN 37 (H) 6 - 20 mg/dL   Creatinine, Ser 0.98 0.44 - 1.00 mg/dL   Calcium 9.3 8.9 - 10.3 mg/dL   GFR calc non Af Amer 53 (L) >60 mL/min   GFR calc Af Amer >60 >60 mL/min    Comment: (NOTE) The eGFR has been calculated using the CKD EPI equation. This calculation has not been validated in all clinical situations. eGFR's persistently <60 mL/min signify possible Chronic Kidney Disease.    Anion gap 7 5 - 15  Blood gas, arterial     Status: Abnormal   Collection Time: 09/13/16  8:25 AM  Result Value Ref Range   O2 Content 3.0 L/min   Delivery systems NASAL CANNULA    pH, Arterial 7.325 (L) 7.350 - 7.450   pCO2 arterial 71.0 (HH) 32.0 - 48.0 mmHg    Comment: CRITICAL RESULT CALLED TO, READ BACK BY AND VERIFIED WITH: SUE PALMER,RRT,RCP AT 0836 BY KIM GROENDAL RRT,RCP ON 09/13/2016    pO2,  Arterial 127 (H) 83.0 - 108.0 mmHg   Bicarbonate 36.0 (H) 20.0 - 28.0 mmol/L   Acid-Base Excess 9.9 (H) 0.0 - 2.0 mmol/L   O2 Saturation 98.7 %   Patient temperature 98.6    Collection site RIGHT RADIAL    Drawn by [id number]    Sample type ARTERIAL DRAW    Allens test (pass/fail) PASS PASS    Imaging: Dg Chest Port 1 View  Result Date: 09/12/2016 CLINICAL DATA:  Shortness of breath. EXAM: PORTABLE CHEST 1 VIEW COMPARISON:  Radiograph of same day. FINDINGS: Stable cardiomediastinal silhouette is noted. No pneumothorax or pleural effusion is noted. No acute pulmonary disease is noted. Stable bilateral lung scarring is noted. No acute pulmonary disease is noted. Bony thorax is unremarkable. IMPRESSION: No acute cardiopulmonary abnormality seen. Electronically Signed   By: Marijo Conception, M.D.   On: 09/12/2016 19:45   Dg Chest Port 1 View  Result Date: 09/12/2016 CLINICAL DATA:  Shortness of breath, abnormal lung sounds at the bases bilaterally. History  of COPD, atrial flutter, CHF. EXAM: PORTABLE CHEST 1 VIEW COMPARISON:  PA and lateral chest x-ray of September 09, 2016 FINDINGS: The lungs remain mildly hyperinflated. The interstitial markings remain coarse especially in the right upper lobe. There is no alveolar infiltrate or pleural effusion. The cardiac silhouette is top-normal in size. The pulmonary vascularity is normal. There is calcification in the wall of the aortic arch. The trachea is midline. The bony thorax exhibits no acute abnormality. There are chronic changes of both shoulders. IMPRESSION: COPD.  Mild cardiomegaly.  No pneumonia nor pulmonary edema. Thoracic aortic atherosclerosis. Electronically Signed   By: David  Martinique M.D.   On: 09/12/2016 08:35    Assessment:  1. Principal Problem: 2.   COPD exacerbation ([place]) 3. Active Problems: 4.   Paroxysmal atrial fibrillation ([facility]) 5.   HTN (hypertension) 6.   Acute on chronic respiratory failure ([place]) 7.   Chronic diastolic CHF  (congestive heart failure) ([place]) 8.   COPD with acute exacerbation ([place]) 9.   Plan:  1. Marked improvement overnight in mental status with decrease in PCO2. Off bipap currently - not sure what her baseline CO2 is. Seems dry - would not additionally diurese. Main issue is tachybrady syndrome with recurrent a-fib and RVR in the 140's today. Planned cardizem gtts, however, she had nausea and vomiting with this in the past. Needs short-acting rate control, will start esmolol gtts - I will d/w EP about a pacemaker - I suspect she will need to to help control rates given her significant bradycardia yesterday (however, in the setting of sotalol which is washing out).  Time Spent Directly with Patient:  15 minutes  Length of Stay:  LOS: 3 days   Pixie Casino, MD, [facility] Attending Cardiologist [place] 09/13/2016, 8:50 AM

## 2016-09-13 NOTE — Progress Notes (Signed)
  Echocardiogram 2D Echocardiogram has been performed.  Claudia [name] 09/13/2016, 1:11 PM

## 2016-09-13 NOTE — Progress Notes (Signed)
PROGRESS NOTE    Claudia [name]  [id number] DOB: Dec 29, 1932 DOA: 09/09/2016 PCP: Pcp Not In System   Chief Complaint  Patient presents with  . Shortness of Breath  . Cough    Brief Narrative:  HPI on 09/09/2016 by Dr. Christia Reading Opyd Claudia [name] is a 83 y.o. female with medical history significant for paroxysmal atrial fibrillation on warfarin, COPD with chronic hypoxic respiratory failure, chronic diastolic CHF, and hypertension who presents in transfer from [facility] where she presented with 2 weeks of dry cough and dyspnea. Patient reports that she had been in her usual state of health until the insidious development of a dry cough and mild exertional dyspnea approximately 2 weeks ago. These symptoms have been intermittent since that time, but have become more consistent over the past couple days. She had dyspnea with minimal exertion yesterday, then after a coughing fit last night, had difficulty catching her breath and has experienced persistent shortness of breath while at rest since that time. Patient denies any recent fevers or chills, rhinorrhea, sore throat, or headache. She denies any chest pain or palpitations, and also denies lower extremity swelling or orthopnea. Patient denies sick contacts, recent long distance travel, or recent antibiotic use.   Interim history Being treated for COPD exacerbation.  Developed Afib RVR, given small doses of IV metoprolol and responded well. This morning however, patient had episode of nausea vomiting, and HR fell to 30, sinus.  BP stable. Rapid response called. HR improved to 60s, then back to Afib RVR at 150.  No medications given.  HR back to 30s. Cardiology consulted. ABG showed hypercapnea, placed on BiPAP.  Transferred to ICU for continued BiPAP and esmolol drip.   Assessment & Plan   Sinus bradycardia/ Tachy-brady syndrome -Patient's HR fell to 30s Sinus, however with no intervention, back to Afib RVR, rate 150.  Again no  medications given, HR back to 30s. Rapid response called. -Wonder if first episode of bradycardia due to vasovagal cause, as patient did have nausea and vomiting. -Cardiology consulted and appreciated, ?need for pacemaker -Held sotalol and metoprolol IV -CXR obtained, no infection appreciated -Echocardiogram pending -TSH 0.528, magnesium level 2.2 (potassium 4.9) -Spoke with cardiology, Dr. Debara Pickett, plan for transfer to ICU, placed on esmolol drip. He will speak with EP.  COPD with acute exacerbation / Acute hypoxic hypercapnic respiratory failure -Presented with progressive cough and dyspnea  -Hypoxic to 76% on room air with exertion, 88% while at rest  -CXR is clear and there is no evidence for an infectious etiology  -Continue DuoNebs -Placed on levaquin, however, patient feels that levaquin is adding to her shortness of breath -Continue IV solumedrol, supplemental O2 to maintain sats >92%, nebs -Patient may benefit from nebulizer/biapap at home -Repeat CXR today showed COPD, no pneumonia -ABG showed pCO2 90, placed on BiPAP, through the night. ABG today showed mild improvement in pCO2 (down to 71), acidosis improving mildly.  -PCCM consulted and greatly appreciated.   Chronic diastolic CHF  -Appears euvolemic on presentation; BNP noted to be mildly elevated, but no JVD, peripheral edema, gallop, or crackles  -TTE (01/19/09) with EF 0000000, grade 1 diastolic dysfunction, and mild MR  -Has not required diuretics at home -continue losartan- will hold   -monitor daily weights, intake/output  Paroxysmal atrial fibrillation, RVR -Currently in Afib -CHADS-VASc is 35 (age x2, gender, HTN, CHF)  -Continue AC with warfarin -sotalol held (see discussion above)  Essential Hypertension  -BP mildly elevated on admission  -  Norvasc and losartan- will hold as patients BP is soft -Hydralazine IV PRN  Nausea/vomiting -unknown etiology  -Continue antiemetics PRN  DVT Prophylaxis   coumadin  Code Status: Full  Family Communication: none at bedside  Disposition Plan: Admitted. Continue IV steroids, treatment for COPD and Afib. Transferred to ICU for continued care/monitoring of Resp failure and tachy/brady syndrome.  Consultants Cardiology  PCCM  Procedures  None  Antibiotics   Anti-infectives    Start     Dose/Rate Route Frequency Ordered Stop   09/13/16 1000  levofloxacin (LEVAQUIN) tablet 250 mg  Status:  Discontinued     250 mg Oral Daily 09/12/16 1108 09/12/16 1826   09/10/16 1000  levofloxacin (LEVAQUIN) tablet 500 mg  Status:  Discontinued     500 mg Oral Daily 09/10/16 0853 09/12/16 1108   09/09/16 0930  levofloxacin (LEVAQUIN) tablet 500 mg     500 mg Oral  Once 09/09/16 [id number] 09/09/16 0957      Subjective:   Claudia [name] seen and examined today.  Patient states she is feeling better this morning.  Does not like the BiPAP mask but understands why she needs it. Feels hungry but complains of nausea due to her hunger.  Currently denies chest pain or abdominal pain.     Objective:   Vitals:   09/13/16 1032 09/13/16 1045 09/13/16 1100 09/13/16 1138  BP:      Pulse:  (!) 152    Resp: 20 19 20    Temp:    97.9 F (36.6 C)  TempSrc:    Axillary  SpO2: 99% 99% 98%   Weight:      Height:        Intake/Output Summary (Last 24 hours) at 09/13/16 1200 Last data filed at 09/13/16 1000  Gross per 24 hour  Intake              366 ml  Output              575 ml  Net             -209 ml   Filed Weights   09/12/16 0649 09/12/16 1845 09/13/16 0439  Weight: 59.6 kg (131 lb 6.4 oz) 61.4 kg (135 lb 5.8 oz) 60.3 kg (132 lb 15 oz)    Exam  General: Well developed, well nourished, NAD  HEENT: NCAT, mucous membranes moist.   Cardiovascular: S1 S2 auscultated, 2/6SEM, irregularly irregular  Respiratory: Diminished breath sounds, BiPAP in place.   Abdomen: Soft, nontender, nondistended, + bowel sounds  Extremities: warm dry without cyanosis  clubbing or edema  Neuro: AAOx3, nonfocal  Psych: Appropriate mood and affect, pleasant   Data Reviewed: I have personally reviewed following labs and imaging studies  CBC:  Recent Labs Lab 09/09/16 0934 09/11/16 0311 09/12/16 0501 09/13/16 0405  WBC 5.3 8.2 11.5* 9.1  NEUTROABS 3.8  --   --   --   HGB 12.6 13.2 13.3 13.4  HCT 40.4 42.5 44.5 46.0  MCV 91.2 90.2 92.7 95.6  PLT 166 182 222 [id number]   Basic Metabolic Panel:  Recent Labs Lab 09/09/16 0934 09/10/16 0507 09/11/16 0311 09/12/16 0501 09/13/16 0405  NA 137 139 142 143 141  K 4.2 4.2 4.1 4.6 4.9  CL 102 104 103 102 100*  CO2 30 28 29  33* 34*  GLUCOSE 103* 106* 133* 142* 124*  BUN 11 13 20  35* 37*  CREATININE 0.98 0.97 0.99 1.04* 0.98  CALCIUM 9.1 9.3 9.5 9.5 9.3  MG  --   --   --  2.2  --    GFR: Estimated Creatinine Clearance: 38.5 mL/min (by C-G formula based on SCr of 0.98 mg/dL). Liver Function Tests:  Recent Labs Lab 09/09/16 0934  AST 23  ALT 14  ALKPHOS 78  BILITOT 0.8  PROT 6.3*  ALBUMIN 4.0   No results for input(s): LIPASE, AMYLASE in the last 168 hours. No results for input(s): AMMONIA in the last 168 hours. Coagulation Profile:  Recent Labs Lab 09/10/16 0507 09/11/16 0311 09/12/16 0501 09/13/16 0405  INR 2.85 3.13 3.59 3.32   Cardiac Enzymes:  Recent Labs Lab 09/09/16 0934  TROPONINI <0.03   BNP (last 3 results) No results for input(s): PROBNP in the last 8760 hours. HbA1C: No results for input(s): HGBA1C in the last 72 hours. CBG:  Recent Labs Lab 09/10/16 0641 09/11/16 0547 09/12/16 0555  GLUCAP 101* 121* 130*   Lipid Profile: No results for input(s): CHOL, HDL, LDLCALC, TRIG, CHOLHDL, LDLDIRECT in the last 72 hours. Thyroid Function Tests:  Recent Labs  09/12/16 1116  TSH 0.528   Anemia Panel: No results for input(s): VITAMINB12, FOLATE, FERRITIN, TIBC, IRON, RETICCTPCT in the last 72 hours. Urine analysis:    Component Value Date/Time   COLORURINE  YELLOW 09/09/2016 1050   APPEARANCEUR CLEAR 09/09/2016 1050   LABSPEC 1.007 09/09/2016 1050   PHURINE 7.5 09/09/2016 1050   GLUCOSEU NEGATIVE 09/09/2016 1050   HGBUR NEGATIVE 09/09/2016 1050   BILIRUBINUR NEGATIVE 09/09/2016 1050   KETONESUR NEGATIVE 09/09/2016 1050   PROTEINUR NEGATIVE 09/09/2016 1050   UROBILINOGEN 0.2 01/17/2009 1750   NITRITE NEGATIVE 09/09/2016 1050   LEUKOCYTESUR TRACE (A) 09/09/2016 1050   Sepsis Labs: @LABRCNTIP (procalcitonin:4,lacticidven:4)  ) Recent Results (from the past 240 hour(s))  Respiratory Panel by PCR     Status: None   Collection Time: 09/10/16  1:22 PM  Result Value Ref Range Status   Adenovirus NOT DETECTED NOT DETECTED Final   Coronavirus 229E NOT DETECTED NOT DETECTED Final   Coronavirus HKU1 NOT DETECTED NOT DETECTED Final   Coronavirus NL63 NOT DETECTED NOT DETECTED Final   Coronavirus OC43 NOT DETECTED NOT DETECTED Final   Metapneumovirus NOT DETECTED NOT DETECTED Final   Rhinovirus / Enterovirus NOT DETECTED NOT DETECTED Final   Influenza A NOT DETECTED NOT DETECTED Final   Influenza B NOT DETECTED NOT DETECTED Final   Parainfluenza Virus 1 NOT DETECTED NOT DETECTED Final   Parainfluenza Virus 2 NOT DETECTED NOT DETECTED Final   Parainfluenza Virus 3 NOT DETECTED NOT DETECTED Final   Parainfluenza Virus 4 NOT DETECTED NOT DETECTED Final   Respiratory Syncytial Virus NOT DETECTED NOT DETECTED Final   Bordetella pertussis NOT DETECTED NOT DETECTED Final   Chlamydophila pneumoniae NOT DETECTED NOT DETECTED Final   Mycoplasma pneumoniae NOT DETECTED NOT DETECTED Final  MRSA PCR Screening     Status: None   Collection Time: 09/13/16  1:53 AM  Result Value Ref Range Status   MRSA by PCR NEGATIVE NEGATIVE Final    Comment:        The GeneXpert MRSA Assay (FDA approved for NASAL specimens only), is one component of a comprehensive MRSA colonization surveillance program. It is not intended to diagnose MRSA infection nor to guide  or monitor treatment for MRSA infections.       Radiology Studies: Dg Chest Port 1 View  Result Date: 09/12/2016 CLINICAL DATA:  Shortness of breath. EXAM: PORTABLE CHEST 1 VIEW COMPARISON:  Radiograph of same day.  FINDINGS: Stable cardiomediastinal silhouette is noted. No pneumothorax or pleural effusion is noted. No acute pulmonary disease is noted. Stable bilateral lung scarring is noted. No acute pulmonary disease is noted. Bony thorax is unremarkable. IMPRESSION: No acute cardiopulmonary abnormality seen. Electronically Signed   By: Marijo Conception, M.D.   On: 09/12/2016 19:45   Dg Chest Port 1 View  Result Date: 09/12/2016 CLINICAL DATA:  Shortness of breath, abnormal lung sounds at the bases bilaterally. History of COPD, atrial flutter, CHF. EXAM: PORTABLE CHEST 1 VIEW COMPARISON:  PA and lateral chest x-ray of September 09, 2016 FINDINGS: The lungs remain mildly hyperinflated. The interstitial markings remain coarse especially in the right upper lobe. There is no alveolar infiltrate or pleural effusion. The cardiac silhouette is top-normal in size. The pulmonary vascularity is normal. There is calcification in the wall of the aortic arch. The trachea is midline. The bony thorax exhibits no acute abnormality. There are chronic changes of both shoulders. IMPRESSION: COPD.  Mild cardiomegaly.  No pneumonia nor pulmonary edema. Thoracic aortic atherosclerosis. Electronically Signed   By: David  Martinique M.D.   On: 09/12/2016 08:35     Scheduled Meds: . aspirin EC  81 mg Oral Daily  . [START ON 09/14/2016] chlorhexidine  15 mL Mouth Rinse BID  . dextrose      . ipratropium-albuterol  3 mL Nebulization Q6H  . mouth rinse  15 mL Mouth Rinse q12n4p  . methylPREDNISolone (SOLU-MEDROL) injection  60 mg Intravenous Q12H  . pantoprazole  40 mg Oral Daily  . polyvinyl alcohol  1 drop Both Eyes TID  . sodium chloride flush  3 mL Intravenous Q12H  . sodium chloride flush  3 mL Intravenous Q12H  .  Warfarin - Pharmacist Dosing Inpatient   Does not apply q1800   Continuous Infusions: . esmolol       LOS: 3 days   Time Spent in minutes   30 minutes  Sumeet Geter D.O. on 09/13/2016 at 12:00 PM  Between 7am to 7pm - Pager - [phone]  After 7pm go to [email or web address] - password [email or web address]  And look for the night coverage person covering for me after hours  [facility] Office  [phone]

## 2016-09-13 NOTE — Progress Notes (Addendum)
Physician notified: Eulas Post, [place] At: 1830  Regarding: Pt started on esmolol at 1630 with 510mcg/kg bolus. HR remains 140-150s. BP stable. Pt sleeping. please advise.   Orders: increase gtt to 58mcg/kg/min. Watch BP and for bradycardia. call cardiology if needing to increase greater than 100 mcg/kg/min d/t tachy-brady syndrome.

## 2016-09-13 NOTE — Progress Notes (Addendum)
Last ABG done on 09/12/2016 around 2100, pt. Was on the servo I NIV PC 40%, rate 15, PC 10, Peep 5 (total 15/5). After abg was done pressure was increased to compensate for pt. High co2.

## 2016-09-13 NOTE — Consult Note (Signed)
ELECTROPHYSIOLOGY CONSULT NOTE    Patient ID: Claudia [name] MRN: [id number], DOB/AGE: [birth year] 83 y.o.  Admit date: 09/09/2016 Date of Consult: 09/13/2016   Primary Physician: Pcp Not In System Primary Cardiologist: Dr. Sallyanne Kuster (last in 2014) Requesting MD: Dr. Debara Pickett  Reason for Consultation: tachy-brady  HPI: Claudia [name] is a 83 y.o. female with PMHx of known paroxysmal AFib, COPD, chronic hypoxic respiratory failure, chronic diastolic heart failure, and HTN.  She was admitted to [facility] 09/09/16 with 2 weeks of a dry cough and increased SOB, increasing coughing fits with difficulty recovering from these, feeling worsening SOB.  Record review notes: Upon arrival to the [facility] ED, patient is found to be afebrile, saturating only 76% on room air, and with vitals otherwise stable. EKG features a sinus rhythm with PVCs and nonspecific ST abnormality in the inferior leads which is similar to that seen on prior EKG. Chest x-ray is notable for chronic COPD changes, but no acute cardiopulmonary disease. Chemistry panel and CBC are unremarkable, troponin is undetectable, and BNP is mildly elevated to 257. Patient was treated with duo nebs, Levaquin, and 40 mg prednisone in the emergency department. She reported some subjective improvement with these measures, but remained dyspneic and hypoxic and Makeda Peeks be admitted to the telemetry unit for ongoing evaluation and management of acute exacerbation and COPD with acute on chronic hypoxic respiratory failure and transferred to [facility].  Cardiology was consulted yesterday who noted "SOB improved during hospital stay on nebs and steroids. She developed a-fib RVR and HR improved on IV metoprolol. Today the patient woke up with severe nausea and 3 episodes of vomiting. Pt did not eat breakfast or lunch. Pts HR fell to SR in the 30-40s after N&V, BP stable. Without intervention the HR returned to atrial fib RVR in the 140-150's. Without intervention, the patient went  back in Sinus brady in the 30-60s. The patient was asymptomatic at the time.  Metoprolol and Sotalol held by IM. CXR after event showed no changes."  Initially suspect to be vagally and respiratory mediated.  The patient was through the day yesterday noted to have increasing respiratory insufficiency requiring BIPAP yesterday had elevated PCO2 of 90, , acidotic with PH 7.235  She was not felt to be overloaded but imperically did get IV lasix.  This morning she is doing better from a pulmonary standpoint, though rates remain uncontrolled, and started on esmolol gtt with hx of N/V with dilt, and EP is asked to evaluate for possible tach-brady syndrome.  Upon arrival to ICU she was placed back on BIPAP and her rhythm converted to SR, remains 80's.  Patient is comfortable on BIPAP, denies any CP, or active symptoms outside of her SOB.  Husband is at bedside.    LABS: Today: K+ 4.9 BUN/Creat 37/0.98 INR 3.32 (trending down now)  09/12/16: H/H 13/44 WBC 11.5 plts 222 TSH 0.528   AFib Hx She was last seen by Dr. Sallyanne Kuster in 2014 who noted she was intolerant of amiodarone and flecainide (unclear why) and doing well with Sotalol. Was unaware of her AFib  Past Medical History:  Diagnosis Date  . Abdominal pain, other specified site    [place] - negative abdominal ultrasound  . Abnormal chest x-ray   . Chest pain 01/19/2009   myoview stress - LVEF >75% no reversible ischemia or infarction  . COPD (chronic obstructive pulmonary disease) ([place])   . Dyslipidemia   . Hypertension   . Paroxysmal atrial flutter ([place]) 01/19/2009  echo - EF 0000000; grade 1 diastolic dysfunction; mitral valve- calcified annlus, mildly dilated R ventricle and atrium  . Pneumonia    hx  . PONV (postoperative nausea and vomiting)   . Tremor, essential 09/07/2015     Surgical History:  Past Surgical History:  Procedure Laterality Date  . BREAST LUMPECTOMY WITH NEEDLE LOCALIZATION Right 10/02/2012   Procedure: BREAST  LUMPECTOMY WITH NEEDLE LOCALIZATION;  Surgeon: Harl Bowie, MD;  Location: [place];  Service: General;  Laterality: Right;  . CATARACT EXTRACTION    . EYE SURGERY    . TONSILLECTOMY    . TONSILLECTOMY    . TUBAL LIGATION       Prescriptions Prior to Admission  Medication Sig Dispense Refill Last Dose  . acetaminophen (TYLENOL) 325 MG tablet Take 650 mg by mouth every 6 (six) hours as needed for pain.   Past Week at Unknown time  . albuterol (PROAIR HFA) 108 (90 Base) MCG/ACT inhaler INHALE 2 PUFFS EVERY 6 HOURS AS NEEDED FOR WHEEZING OR SHORTNESS OF BREATH 8.5 g 12 09/09/2016 at am  . ALPRAZolam (XANAX) 0.25 MG tablet TAKE 1 TABLET BY MOUTH THREE TIMES DAILY AS NEEDED 90 tablet 5 09/08/2016 at Unknown time  . amLODipine (NORVASC) 5 MG tablet Take 5 mg by mouth daily.   09/09/2016 at am  . aspirin 81 MG tablet Take 81 mg by mouth daily.     09/09/2016 at 0730  . carboxymethylcellulose (REFRESH PLUS) 0.5 % SOLN Place 1-2 drops into both eyes 2 (two) times daily.   09/09/2016 at am  . losartan (COZAAR) 25 MG tablet TAKE 1 TABLET BY MOUTH EVERY DAY 90 tablet 3 09/09/2016 at am  . OXYGEN Inhale 2 L into the lungs. Use @@ Bedtime   Bedtime at Bedtime  . sotalol (BETAPACE) 80 MG tablet TAKE 1 TABLET BY MOUTH TWICE DAILY 60 tablet 5 09/09/2016 at am  . tiotropium (SPIRIVA HANDIHALER) 18 MCG inhalation capsule 1 inhale daily (Patient taking differently: Place 18 mcg into inhaler and inhale daily. ) 30 capsule 12 09/09/2016 at am  . warfarin (COUMADIN) 1 MG tablet TAKE 1 TO 2 TABLETS BY MOUTH EVERY DAY AS DIRECTED BY COUMADIN CLINIC (Patient taking differently: 1 mg in the evening on Sun/Tues/Thurs/Sat and 2 mg on Mon/Wed/Fri) 135 tablet 1 09/08/2016 at 1930  . amLODipine (NORVASC) 2.5 MG tablet TAKE 1 TABLET BY MOUTH DAILY (Patient not taking: Reported on 09/10/2016) 30 tablet 10 Not Taking at Unknown time    Inpatient Medications:  . aspirin EC  81 mg Oral Daily  . [START ON 09/14/2016] chlorhexidine  15  mL Mouth Rinse BID  . dextrose      . ipratropium-albuterol  3 mL Nebulization Q6H  . mouth rinse  15 mL Mouth Rinse q12n4p  . methylPREDNISolone (SOLU-MEDROL) injection  60 mg Intravenous Q12H  . polyvinyl alcohol  1 drop Both Eyes TID  . sodium chloride flush  3 mL Intravenous Q12H  . sodium chloride flush  3 mL Intravenous Q12H  . Warfarin - Pharmacist Dosing Inpatient   Does not apply q1800    Allergies:  Allergies  Allergen Reactions  . Augmentin [Amoxicillin-Pot Clavulanate] Shortness Of Breath  . Doxycycline Shortness Of Breath  . Nitrofurantoin Shortness Of Breath  . Amiodarone Hcl Nausea And Vomiting  . Clarithromycin Other (See Comments)    Crazy feeling, confusion  . Clonazepam Other (See Comments)    Doesn't remember reaction  . Codeine Nausea And Vomiting  . Diltiazem  Hcl Nausea And Vomiting  . Fish Oil Nausea And Vomiting  . Flecainide Nausea Only  . Multaq [Dronedarone] Nausea Only  . Rosuvastatin Other (See Comments)    Leg cramps   . Zetia [Ezetimibe] Other (See Comments)    Leg cramps    Social History   Social History  . Marital status: Married    Spouse name: Shanon Brow  . Number of children: 3  . Years of education: 12   Occupational History  . retired    Social History Main Topics  . Smoking status: Former Smoker    Packs/day: 2.00    Years: 25.00    Types: Cigarettes    Quit date: 08/15/1987  . Smokeless tobacco: Never Used  . Alcohol use No  . Drug use: No  . Sexual activity: Not on file   Other Topics Concern  . Not on file   Social History Narrative   Patient lives with her husband Shanon Brow).   Patient has 3 children.   Patient is right-handed.   Patient drinks 1 16 oz of Coke daily.   Patient is retired.   Patient has a high school education.     Family History  Problem Relation Age of Onset  . Bone cancer Mother   . Cancer Mother     bone  . Bone cancer Father   . Lung cancer Father   . Cancer Father     Lung  .  Hyperlipidemia Father   . Hypertension Father   . Hypertension Sister   . Cancer Sister     breast  . Hyperlipidemia Sister   . Hyperlipidemia Sister   . Alzheimer's disease Sister   . Hypertension Sister   . Hyperlipidemia Brother   . Hypertension Brother   . Breast cancer Sister      Review of Systems: All other systems reviewed and are otherwise negative except as noted above.  Physical Exam: Vitals:   09/13/16 0334 09/13/16 0439 09/13/16 0735 09/13/16 0810  BP: 136/71   (!) 130/91  Pulse: (!) 133   (!) 135  Resp: 19   19  Temp: 98 F (36.7 C)  97.7 F (36.5 C)   TempSrc: Axillary  Axillary   SpO2: 98%   97%  Weight:  132 lb 15 oz (60.3 kg)    Height:        GEN- The patient is well appearing, alert and oriented x 3 today.   HEENT: normocephalic, atraumatic; sclera clear, conjunctiva pink; hearing intact; oropharynx clear; neck supple, no JVP Lymph- no cervical lymphadenopathy Lungs- on BIPAP, clear b/l  No wheezes, rales, rhonchi Heart-  RRR, extrasystoles, no significant murmurs, rubs or gallops are appreciated GI- soft, non-tender, non-distended Extremities- no clubbing, cyanosis, no edema MS- no significant deformity or atrophy Skin- warm and dry, no rash or lesion Psych- euthymic mood, full affect Neuro- no gross deficits observed  Labs:   Lab Results  Component Value Date   WBC 9.1 09/13/2016   HGB 13.4 09/13/2016   HCT 46.0 09/13/2016   MCV 95.6 09/13/2016   PLT 174 09/13/2016    Recent Labs Lab 09/09/16 0934  09/13/16 0405  NA 137  < > 141  K 4.2  < > 4.9  CL 102  < > 100*  CO2 30  < > 34*  BUN 11  < > 37*  CREATININE 0.98  < > 0.98  CALCIUM 9.1  < > 9.3  PROT 6.3*  --   --  BILITOT 0.8  --   --   ALKPHOS 78  --   --   ALT 14  --   --   AST 23  --   --   GLUCOSE 103*  < > 124*  < > = values in this interval not displayed.    Radiology/Studies:   Dg Chest Port 1 View Result Date: 09/12/2016 CLINICAL DATA:  Shortness of breath.  EXAM: PORTABLE CHEST 1 VIEW COMPARISON:  Radiograph of same day. FINDINGS: Stable cardiomediastinal silhouette is noted. No pneumothorax or pleural effusion is noted. No acute pulmonary disease is noted. Stable bilateral lung scarring is noted. No acute pulmonary disease is noted. Bony thorax is unremarkable. IMPRESSION: No acute cardiopulmonary abnormality seen. Electronically Signed   By: Marijo Conception, M.D.   On: 09/12/2016 19:45   Dg Chest Port 1 View Result Date: 09/12/2016 CLINICAL DATA:  Shortness of breath, abnormal lung sounds at the bases bilaterally. History of COPD, atrial flutter, CHF. EXAM: PORTABLE CHEST 1 VIEW COMPARISON:  PA and lateral chest x-ray of September 09, 2016 FINDINGS: The lungs remain mildly hyperinflated. The interstitial markings remain coarse especially in the right upper lobe. There is no alveolar infiltrate or pleural effusion. The cardiac silhouette is top-normal in size. The pulmonary vascularity is normal. There is calcification in the wall of the aortic arch. The trachea is midline. The bony thorax exhibits no acute abnormality. There are chronic changes of both shoulders. IMPRESSION: COPD.  Mild cardiomegaly.  No pneumonia nor pulmonary edema. Thoracic aortic atherosclerosis. Electronically Signed   By: David  Martinique M.D.   On: 09/12/2016 08:35    EKG:  - SR, 86bpm, PR 130ms, QRS 47ms, QTc 467ms - SR, PVC's, QTc 477ms - AFlutter/course AFib, 127bpm, QTc 3102ms - sinus, blocked PACs in a bigeminal pattern V rate 40bpm, QTc 344ms TELEMETRY: currently is SR 80's, PACs, in review she has had PAFib with RVR intermittently SR, and bradycardic rates 40's with blocked PAC's, in a bigeminal pattern with V rates about 40bpm Echo this admission is ordered /pending  01/19/09: TTE Study Conclusions 1. Left ventricle: The cavity size was normal. Systolic function was  normal. The estimated ejection fraction was in the range of 55%  to 60%. Wall motion was normal; there  were no regional wall  motion abnormalities. Doppler parameters are consistent with  abnormal left ventricular relaxation (grade 1 diastolic  dysfunction). 2. Mitral valve: Calcified annulus. Mild regurgitation. 3. Right ventricle: The cavity size was mildly dilated. Systolic  function was mildly reduced. 4. Right atrium: The atrium was mildly dilated.  Assessment and Plan:   1. Paroxysmal AFib     Bradycardic rates observed blocked PACs, home sotalol stopped yesterday     RAFib again this morning     Echo is pending (EF in 2010 55-60%, no significant VHD)     INR today is 3.3, otherwise labs look OK     Suspect there is a respiratory that continues to play a role, she is back on BIPAP in SR 80's, her BP has remained stable without symptoms.  Continue to manage respiratory issues, hold off Esmolol gtt for now (discussed with RN, if RAFib returns and persists can go ahead and start.  Plan to initiate Tikosyn for rhythm control, discussed with RPH, wait 48 hours after last sotalol (tomorrow).  Davarion Cuffee ask case management to look into coverage.  2. Respiratory failure     Acute on chronic, COPD exacerbation     IM noted,  not felt to have infectious component     Getting steroids, nebs, back on BIPAP this morning  3. Diastolic CHF mentioned     Not felt to be actively overloaded  4. HTN     Stable, no changes  5. N/V     Not an ongoing complaint at this time  Venetia Night, Hershal Coria 09/13/2016 10:28 AM    I have seen and examined this patient with [name].  Agree with above, note added to reflect my findings.  On exam, regular rhythm, no murmurs, lungs clear. Presented to the hospital with respiratory distress having a possible COPD exacerbation. She does have a history of atrial fibrillation and is maintained on sotalol. She did go into fast atrial fibrillation, and then converted to sinus rhythm with episodes of bradycardia. Her episodes of bradycardia are  associated with PACs and atrial bigeminy pattern. Med being said it is possible that she does have some evidence of tachybradycardia syndrome. She says that prior to her admission maintained on sotalol without any major issues. To decrease her chances of significant bradycardia associated with sotalol, would plan to load her on dofetilide. She requires 48 hours after her last dose of sotalol prior to initiation of dofetilide. We'll start her dofetilide tomorrow morning. Of note she was in atrial fibrillation when she was moved to 4 N, and once put on BiPAP, she returned to sinus rhythm. It is certainly possible that her atrial fibrillation is related to her respiratory status.   Darey Hershberger M. Darey Hershberger MD 09/13/2016 3:38 PM

## 2016-09-14 LAB — CBC
HCT: 44.3 % (ref 36.0–46.0)
HEMOGLOBIN: 13 g/dL (ref 12.0–15.0)
MCH: 28.1 pg (ref 26.0–34.0)
MCHC: 29.3 g/dL — AB (ref 30.0–36.0)
MCV: 95.9 fL (ref 78.0–100.0)
Platelets: 186 10*3/uL (ref 150–400)
RBC: 4.62 MIL/uL (ref 3.87–5.11)
RDW: 14.2 % (ref 11.5–15.5)
WBC: 8 10*3/uL (ref 4.0–10.5)

## 2016-09-14 LAB — BASIC METABOLIC PANEL
ANION GAP: 7 (ref 5–15)
BUN: 37 mg/dL — ABNORMAL HIGH (ref 6–20)
CALCIUM: 9.5 mg/dL (ref 8.9–10.3)
CHLORIDE: 99 mmol/L — AB (ref 101–111)
CO2: 38 mmol/L — ABNORMAL HIGH (ref 22–32)
CREATININE: 1 mg/dL (ref 0.44–1.00)
GFR calc Af Amer: 60 mL/min — ABNORMAL LOW (ref >60)
GFR calc non Af Amer: 51 mL/min — ABNORMAL LOW (ref >60)
Glucose, Bld: 138 mg/dL — ABNORMAL HIGH (ref 65–99)
Potassium: 5.3 mmol/L — ABNORMAL HIGH (ref 3.5–5.1)
SODIUM: 144 mmol/L (ref 135–145)

## 2016-09-14 LAB — PROTIME-INR
INR: 3.12
PROTHROMBIN TIME: 32.8 s — AB (ref 11.4–15.2)

## 2016-09-14 LAB — GLUCOSE, CAPILLARY: GLUCOSE-CAPILLARY: 149 mg/dL — AB (ref 65–99)

## 2016-09-14 LAB — PHOSPHORUS: PHOSPHORUS: 3.8 mg/dL (ref 2.5–4.6)

## 2016-09-14 LAB — MAGNESIUM: MAGNESIUM: 2.5 mg/dL — AB (ref 1.7–2.4)

## 2016-09-14 MED ORDER — DOFETILIDE 250 MCG PO CAPS
250.0000 ug | ORAL_CAPSULE | Freq: Two times a day (BID) | ORAL | Status: DC
  Administered 2016-09-14 – 2016-09-22 (×17): 250 ug via ORAL
  Filled 2016-09-14 (×17): qty 1

## 2016-09-14 MED ORDER — WARFARIN SODIUM 2 MG PO TABS
2.0000 mg | ORAL_TABLET | Freq: Once | ORAL | Status: AC
  Administered 2016-09-14: 2 mg via ORAL
  Filled 2016-09-14: qty 1

## 2016-09-14 MED ORDER — SODIUM CHLORIDE 0.9 % IV SOLN: 250.0000 mL | INTRAVENOUS | Status: DC | PRN

## 2016-09-14 MED ORDER — SODIUM CHLORIDE 0.9% FLUSH
3.0000 mL | Freq: Two times a day (BID) | INTRAVENOUS | Status: DC
  Administered 2016-09-14 – 2016-09-15 (×3): 3 mL via INTRAVENOUS

## 2016-09-14 MED ORDER — SODIUM CHLORIDE 0.9% FLUSH: 3.0000 mL | INTRAVENOUS | Status: DC | PRN

## 2016-09-14 NOTE — Progress Notes (Signed)
PULMONARY / CRITICAL CARE MEDICINE   Name: Claudia [name] MRN: [id number] DOB: [birth year]    ADMISSION DATE:  09/09/2016 CONSULTATION DATE:  09/13/16  REFERRING MD:  Ree Kida, MD  CHIEF COMPLAINT:  Shortness of breath  HISTORY OF PRESENT ILLNESS:   Claudia [name] is a 83yo woman with PMHx of paroxysmal AFib on warfarin, COPD, chronic diastolic HF, and HTN admitted on 1/27 for shortness of breath and cough found to be hypoxic at 76% on room air. Dyspnea and cough attributed to COPD exacerbation. She was noted to be in AFib with RVR on 1/28 and responded to scheduled sotalol and IV metoprolol. On 1/30, patient's HR fell into the 30's but then without intervention went back into AFib with RVR with HR in 150s. Cardiology consulted on 1/30 with concerns of tachy-brady syndrome. She then developed respiratory distress last night and was placed on BiPAP. Patient transferred to ICU to manage her tachy-brady and respiratory failure.   Patient reports her SOB is better on BiPAP. Denies any chest pain or abdominal pain. Does note some nausea. Denies any fevers, cough, myalgias at home.   SUBJECTIVE:  No events overnight, did not need BiPAP overnight  VITAL SIGNS: BP (!) 89/69   Pulse (!) 123   Temp 97.6 F (36.4 C) (Oral)   Resp (!) 22   Ht 5\' 2"  (1.575 m)   Wt 60.9 kg (134 lb 4.2 oz)   SpO2 97%   BMI 24.56 kg/m   HEMODYNAMICS:    VENTILATOR SETTINGS: Vent Mode: BIPAP FiO2 (%):  [40 %] 40 % Set Rate:  [15 bmp] 15 bmp PEEP:  [6 cmH20] 6 cmH20  INTAKE / OUTPUT: I/O last 3 completed shifts: In: 559.8 [P.O.:240; I.V.:319.8] Out: 1000 [Urine:1000]  PHYSICAL EXAMINATION: General:  Elderly woman resting in bed, NAD off BiPAP Neuro: Alert and oriented x 3 HEENT:  [place]/AT, EOMI, PERRL Cardiovascular:  RRR, not in AFib currently, HRs in 80s, no m/g/r Lungs: Bibasilar crackles noted Abdomen:  BS+, soft, non-tender Musculoskeletal:  Moves all extremities, no peripheral edema Skin:  Warm,  dry  LABS:  BMET  Recent Labs Lab 09/12/16 0501 09/13/16 0405 09/14/16 0231  NA 143 141 144  K 4.6 4.9 5.3*  CL 102 100* 99*  CO2 33* 34* 38*  BUN 35* 37* 37*  CREATININE 1.04* 0.98 1.00  GLUCOSE 142* 124* 138*   Electrolytes  Recent Labs Lab 09/12/16 0501 09/13/16 0405 09/14/16 0231  CALCIUM 9.5 9.3 9.5  MG 2.2  --  2.5*  PHOS  --   --  3.8   CBC  Recent Labs Lab 09/12/16 0501 09/13/16 0405 09/14/16 0231  WBC 11.5* 9.1 8.0  HGB 13.3 13.4 13.0  HCT 44.5 46.0 44.3  PLT 222 174 186   Coag's  Recent Labs Lab 09/12/16 0501 09/13/16 0405 09/14/16 0231  INR 3.59 3.32 3.12   Sepsis Markers No results for input(s): LATICACIDVEN, PROCALCITON, O2SATVEN in the last 168 hours.  ABG  Recent Labs Lab 09/12/16 1740 09/12/16 2105 09/13/16 0825  PHART 7.235* 7.285* 7.325*  PCO2ART 90.1* 76.0* 71.0*  PO2ART 105 84.6 127*   Liver Enzymes  Recent Labs Lab 09/09/16 0934  AST 23  ALT 14  ALKPHOS 78  BILITOT 0.8  ALBUMIN 4.0   Cardiac Enzymes  Recent Labs Lab 09/09/16 0934  TROPONINI <0.03   Glucose  Recent Labs Lab 09/10/16 0641 09/11/16 0547 09/12/16 0555 09/14/16 0814  GLUCAP 101* 121* 130* 149*   Imaging   STUDIES:  CXR 1/30> COPD changes, no acute abnormalities   CULTURES: RVP 1/28> negative  ANTIBIOTICS: None  SIGNIFICANT EVENTS: 1/27> Admitted with dyspnea and hypoxic respiratory failure 1/28> AFib with RVR, responded to metoprolol, sotalol 1/30>Bradycardia in 30s, then AFib with RVR in 150s 1/31> Transferred to ICU due to respiratory distress, tachy-brady  LINES/TUBES: PIV 1/27>  DISCUSSION: 83 year old female with extensive PMH who presents to PCCM with acute on chronic respiratory failure due to hypercarbia from COPD as well as a-fib with RVR who is requiring BiPAP and esmolol drips for control of both respectively.  ASSESSMENT / PLAN:  PULMONARY A: Acute hypoxic and hypercarbic respiratory failure COPD  exacerbation  P:   Change BiPAP to PRN Transition to oxygen via [place], aim for sats in 88-92% range Duonebs Q6H Solumedrol 60 q12h, decrease gradually starting 2/2  CARDIOVASCULAR A:  Hx paroxysmal AFib, now with RVR and bradycardia (tachy-brady) Diastolic CHF HTN P:  Esmolol gtt per cards Likely will need PM, EP consulted but will defer to cardiology Warfarin for AF Cardiac monitoring Will speak with EP, esmolol drip alone is clearly ineffective.  RENAL A:   Metabolic alkalosis- likely reactive to respiratory acidosis Hyperkalemia P:   Follow bmet Trend UOP Replace lytes as needed  GASTROINTESTINAL A:   NPO while on BiPAP GI PPx P:   Heart healthy diet PPI with steroids  HEMATOLOGIC A:   No acute issues P:  CBC intermittently Monitor for bleeding Warfarin for [facility]  INFECTIOUS A:   No evidence of infection P:   Continue to monitor   ENDOCRINE A:   No acute issues P:   Monitor glucose on bmet Add SSI if glucose >180 consistently  NEUROLOGIC A:   No acute issues A&O x 3 P:   Continue to monitor   FAMILY  - Updates: Patient and husband updated at bedside  Hold in ICU overnight given HR in the 150's even with esmolol drip at 100 mg  The patient is critically ill with multiple organ systems failure and requires high complexity decision making for assessment and support, frequent evaluation and titration of therapies, application of advanced monitoring technologies and extensive interpretation of multiple databases.   Critical Care Time devoted to patient care services described in this note is  35  Minutes. This time reflects time of care of this signee Dr Jennet Maduro. This critical care time does not reflect procedure time, or teaching time or supervisory time of PA/NP/Med student/Med Resident etc but could involve care discussion time.  Rush Farmer, M.D. [facility] Pulmonary/Critical Care Medicine. Pager: [phone]. After hours pager: [phone].

## 2016-09-14 NOTE — Progress Notes (Signed)
Pharmacy Review for Dofetilide (Tikosyn) Initiation  Admit Complaint: 83 y.o. female admitted 09/09/2016 with atrial fibrillation to be initiated on dofetilide.   Assessment:  Patient Exclusion Criteria: If any screening criteria checked as "Yes", then  patient  should NOT receive dofetilide until criteria item is corrected. If "Yes" please indicate correction plan.  YES  NO Patient  Exclusion Criteria Correction Plan  []  [x]  Baseline QTc interval is greater than or equal to 440 msec. IF above YES box checked dofetilide contraindicated unless patient has ICD; then may proceed if QTc 500-550 msec or with known ventricular conduction abnormalities may proceed with QTc 550-600 msec. QTc = 0.32   []  [x]  Magnesium level is less than 1.8 mEq/l : Last magnesium:  Lab Results  Component Value Date   MG 2.5 (H) 09/14/2016         []  [x]  Potassium level is less than 4 mEq/l : Last potassium:  Lab Results  Component Value Date   K 5.3 (H) 09/14/2016         []  [x]  Patient is known or suspected to have a digoxin level greater than 2 ng/ml: No results found for: DIGOXIN    []  [x]  Creatinine clearance less than 20 ml/min (calculated using Cockcroft-Gault, actual body weight and serum creatinine): Estimated Creatinine Clearance: 37.9 mL/min (by C-G formula based on SCr of 1 mg/dL).    []  [x]  Patient has received drugs known to prolong the QT intervals within the last 48 hours (phenothiazines, tricyclics or tetracyclic antidepressants, erythromycin, H-1 antihistamines, cisapride, fluoroquinolones, azithromycin). Drugs not listed above may have an, as yet, undetected potential to prolong the QT interval, updated information on QT prolonging agents is available at this website:QT prolonging agents   []  [x]  Patient received a dose of hydrochlorothiazide (Oretic) alone or in any combination including triamterene (Dyazide, Maxzide) in the last 48 hours.   []  [x]  Patient received a medication known to  increase dofetilide plasma concentrations prior to initial dofetilide dose:  . Trimethoprim (Primsol, Proloprim) in the last 36 hours . Verapamil (Calan, Verelan) in the last 36 hours or a sustained release dose in the last 72 hours . Megestrol (Megace) in the last 5 days  . Cimetidine (Tagamet) in the last 6 hours . Ketoconazole (Nizoral) in the last 24 hours . Itraconazole (Sporanox) in the last 48 hours  . Prochlorperazine (Compazine) in the last 36 hours    []  [x]  Patient is known to have a history of torsades de pointes; congenital or acquired long QT syndromes.   []  [x]  Patient has received a Class 1 antiarrhythmic with less than 2 half-lives since last dose. (Disopyramide, Quinidine, Procainamide, Lidocaine, Mexiletine, Flecainide, Propafenone)   []  [x]  Patient has received amiodarone therapy in the past 3 months or amiodarone level is greater than 0.3 ng/ml.    Patient has been appropriately anticoagulated with warfarin.  Ordering provider was confirmed at [email or web address] if they are not listed on the [facility] Prescribers list.  Goal of Therapy: Follow renal function, electrolytes, potential drug interactions, and dose adjustment. Provide education and 1 week supply at discharge.  Plan:  [x]   Physician selected initial dose within range recommended for patients level of renal function - will monitor for response.  []   Physician selected initial dose outside of range recommended for patients level of renal function - will discuss if the dose should be altered at this time.   Select One Calculated CrCl  Dose q12h  []  > 60 ml/min 500 mcg  [  x] 40-60 ml/min 250 mcg  []  20-40 ml/min 125 mcg   2. Follow up QTc after the first 5 doses, renal function, electrolytes (K & Mg) daily x 3     days, dose adjustment, success of initiation and facilitate 1 week discharge supply as     clinically indicated.  3. Initiate Tikosyn education video (Call [phone] and ask for Tikosyn Video  # 116).  4. Place Enrollment Form on the chart for discharge supply of dofetilide.   Uvaldo Rising, BCPS  Clinical Pharmacist Pager [phone]  09/14/2016 9:23 AM

## 2016-09-14 NOTE — Progress Notes (Addendum)
SUBJECTIVE: The patient is doing better today.  She is on n/c O2 this AM, denies SOB, no CP, does not perceive palpitations.  Marland Kitchen aspirin EC  81 mg Oral Daily  . chlorhexidine  15 mL Mouth Rinse BID  . dofetilide  250 mcg Oral BID  . levalbuterol  0.63 mg Nebulization Q6H  . mouth rinse  15 mL Mouth Rinse q12n4p  . methylPREDNISolone (SOLU-MEDROL) injection  60 mg Intravenous Q12H  . pantoprazole  40 mg Oral Daily  . polyvinyl alcohol  1 drop Both Eyes TID  . sodium chloride flush  3 mL Intravenous Q12H  . sodium chloride flush  3 mL Intravenous Q12H  . sodium chloride flush  3 mL Intravenous Q12H  . Warfarin - Pharmacist Dosing Inpatient   Does not apply q1800   . esmolol 100 mcg/kg/min (09/14/16 0414)    OBJECTIVE: Physical Exam: Vitals:   09/14/16 0700 09/14/16 0800 09/14/16 0828 09/14/16 0900  BP: 122/86 107/65  (!) 112/98  Pulse: (!) 145 (!) 132 (!) 114 (!) 146  Resp: (!) 22 (!) 22 20 (!) 25  Temp: 97.6 F (36.4 C)     TempSrc: Oral     SpO2: 98% 98% 97% 95%  Weight:      Height:        Intake/Output Summary (Last 24 hours) at 09/14/16 0919 Last data filed at 09/14/16 0800  Gross per 24 hour  Intake           369.97 ml  Output              500 ml  Net          -130.03 ml    Telemetry reveals Coarse AFib/flutter, 130's-140's  GEN- The patient is ill appearing, alert and oriented x 3 today.   Head- normocephalic, atraumatic Eyes-  Sclera clear, conjunctiva pink Ears- hearing intact Oropharynx- clear Neck- supple, no JVP Lungs- Clear to ausculation bilaterally,  normal work of breathing Heart-  IRRR/tachycardic, no significant murmurs, no rubs or gallops GI- soft, NT, ND Extremities- no clubbing, cyanosis, or edema Skin- no rash or lesion Psych- euthymic mood, full affect Neuro- no gross deficits appreciated  LABS: Basic Metabolic Panel:  Recent Labs  09/12/16 0501 09/13/16 0405 09/14/16 0231  NA 143 141 144  K 4.6 4.9 5.3*  CL 102 100* 99*    CO2 33* 34* 38*  GLUCOSE 142* 124* 138*  BUN 35* 37* 37*  CREATININE 1.04* 0.98 1.00  CALCIUM 9.5 9.3 9.5  MG 2.2  --  2.5*  PHOS  --   --  3.8   CBC:  Recent Labs  09/13/16 0405 09/14/16 0231  WBC 9.1 8.0  HGB 13.4 13.0  HCT 46.0 44.3  MCV 95.6 95.9  PLT 174 186    Thyroid Function Tests:  Recent Labs  09/12/16 1116  TSH 0.528      ASSESSMENT AND PLAN:    1. Paroxysmal AFib     Bradycardic rates observed blocked PACs, home sotalol stopped yesterday     RAFib again late yesterday, no recurrent brady rates     Echo EF 60-65%, normal wall motion, trivial pericardial effusion, grade II DD     INR today is 3.12, (she had SR yesterday)     Continue to manage respiratory issues with CCM/pulmonary team     K+ 5.3     Mag 2.5     Creat 1.00 (Calc Creat Cl = 42)  EKG's reviewed by Dr.Kristopher Attwood,  QTc stable to initiate Tikosyn, given clearance Jayesh Marbach be 238mcg dose     Last dose of Sotalol 09/11/16 @ [id number], has been >48 hours, no other noted prohibative meds noted     Pharmacy Jasmon Mattice be consulted to get Tikosyn started   2. Respiratory failure     Acute on chronic, COPD exacerbation     IM noted, not felt to have infectious component     Getting steroids, nebs, PRN BIPAP, on n/c O2 this AM  3. Diastolic CHF mentioned     Not felt to be actively overloaded  4. HTN     Stable, no changes  5. N/V     Not an ongoing complaint  [name], PA-C 09/14/2016 9:19 AM  I have seen and examined this patient with [name].  Agree with above, note added to reflect my findings.  On exam, tachycardic, irregular, no murmurs, lungs clear. Continued atrial fibrillation but no evidence of bradycardia. Plan to start Tikosyn today. Monitor ECGs after each dose, keep K >4 and Mg>2.   She has, in the past, been maintained on sotalol and in sinus rhythm but bradycardia necessitates a change to tikosyn.  Kimorah Ridolfi M. Tonatiuh Mallon MD 09/14/2016 11:48 AM

## 2016-09-14 NOTE — Progress Notes (Signed)
ANTICOAGULATION CONSULT NOTE - Follow Up Consult  Pharmacy Consult for warfarin Indication: atrial fibrillation  Allergies  Allergen Reactions  . Augmentin [Amoxicillin-Pot Clavulanate] Shortness Of Breath  . Doxycycline Shortness Of Breath  . Nitrofurantoin Shortness Of Breath  . Amiodarone Hcl Nausea And Vomiting  . Clarithromycin Other (See Comments)    Crazy feeling, confusion  . Clonazepam Other (See Comments)    Doesn't remember reaction  . Codeine Nausea And Vomiting  . Diltiazem Hcl Nausea And Vomiting  . Fish Oil Nausea And Vomiting  . Flecainide Nausea Only  . Multaq [Dronedarone] Nausea Only  . Rosuvastatin Other (See Comments)    Leg cramps   . Zetia [Ezetimibe] Other (See Comments)    Leg cramps    Patient Measurements: Height: 5\' 2"  (157.5 cm) Weight: 134 lb 4.2 oz (60.9 kg) IBW/kg (Calculated) : 50.1  Vital Signs: Temp: 97.6 F (36.4 C) (02/01 0700) Temp Source: Oral (02/01 0700) BP: 112/98 (02/01 0900) Pulse Rate: 146 (02/01 0900)  Labs:  Recent Labs  09/12/16 0501 09/13/16 0405 09/14/16 0231  HGB 13.3 13.4 13.0  HCT 44.5 46.0 44.3  PLT 222 174 186  LABPROT 36.7* 34.5* 32.8*  INR 3.59 3.32 3.12  CREATININE 1.04* 0.98 1.00    Estimated Creatinine Clearance: 37.9 mL/min (by C-G formula based on SCr of 1 mg/dL).  Assessment: 30 YOF admitted for SOB on PTA warfarin for Afib. Pharmacy consulted to dose warfarin. INR has trended down to 3.12 from 3.59 at admission but remains supratherapeutic. May have been elevated d/t interacting w/ Levaquin which is now discontinued as of 1/30. No bleeding per RN.  Tikosyn started today.  PTA warfarin dose is 2mg  MWF, 1mg  all other days (dose very stable as outpatient per clinic records)  Goal of Therapy:  INR 2-3 Monitor platelets by anticoagulation protocol: Yes   Plan:  Warfarin 2 mg x 1 tonight. Daily INR Monitor s/sx bleeding  Uvaldo Rising, BCPS  Clinical Pharmacist Pager [phone]  09/14/2016 9:27 AM

## 2016-09-15 ENCOUNTER — Inpatient Hospital Stay (HOSPITAL_COMMUNITY): Payer: Medicare Other

## 2016-09-15 LAB — CBC
HCT: 42.6 % (ref 36.0–46.0)
HEMOGLOBIN: 12.5 g/dL (ref 12.0–15.0)
MCH: 27.8 pg (ref 26.0–34.0)
MCHC: 29.3 g/dL — ABNORMAL LOW (ref 30.0–36.0)
MCV: 94.9 fL (ref 78.0–100.0)
Platelets: 161 10*3/uL (ref 150–400)
RBC: 4.49 MIL/uL (ref 3.87–5.11)
RDW: 14.2 % (ref 11.5–15.5)
WBC: 5.7 10*3/uL (ref 4.0–10.5)

## 2016-09-15 LAB — BASIC METABOLIC PANEL
ANION GAP: 9 (ref 5–15)
BUN: 43 mg/dL — ABNORMAL HIGH (ref 6–20)
CO2: 32 mmol/L (ref 22–32)
Calcium: 9.3 mg/dL (ref 8.9–10.3)
Chloride: 100 mmol/L — ABNORMAL LOW (ref 101–111)
Creatinine, Ser: 0.91 mg/dL (ref 0.44–1.00)
GFR, EST NON AFRICAN AMERICAN: 58 mL/min — AB (ref >60)
GLUCOSE: 128 mg/dL — AB (ref 65–99)
POTASSIUM: 5.3 mmol/L — AB (ref 3.5–5.1)
SODIUM: 141 mmol/L (ref 135–145)

## 2016-09-15 LAB — PROTIME-INR
INR: 3.91
Prothrombin Time: 39.3 seconds — ABNORMAL HIGH (ref 11.4–15.2)

## 2016-09-15 LAB — PHOSPHORUS: PHOSPHORUS: 3.6 mg/dL (ref 2.5–4.6)

## 2016-09-15 LAB — GLUCOSE, CAPILLARY: GLUCOSE-CAPILLARY: 126 mg/dL — AB (ref 65–99)

## 2016-09-15 LAB — MAGNESIUM: MAGNESIUM: 2.5 mg/dL — AB (ref 1.7–2.4)

## 2016-09-15 NOTE — Progress Notes (Signed)
ANTICOAGULATION CONSULT NOTE - Follow Up Consult  Pharmacy Consult for warfarin Indication: atrial fibrillation  Allergies  Allergen Reactions  . Augmentin [Amoxicillin-Pot Clavulanate] Shortness Of Breath  . Doxycycline Shortness Of Breath  . Nitrofurantoin Shortness Of Breath  . Amiodarone Hcl Nausea And Vomiting  . Clarithromycin Other (See Comments)    Crazy feeling, confusion  . Clonazepam Other (See Comments)    Doesn't remember reaction  . Codeine Nausea And Vomiting  . Diltiazem Hcl Nausea And Vomiting  . Fish Oil Nausea And Vomiting  . Flecainide Nausea Only  . Multaq [Dronedarone] Nausea Only  . Rosuvastatin Other (See Comments)    Leg cramps   . Zetia [Ezetimibe] Other (See Comments)    Leg cramps    Patient Measurements: Height: 5\' 2"  (157.5 cm) Weight: 136 lb 14.5 oz (62.1 kg) IBW/kg (Calculated) : 50.1  Vital Signs: Temp: 97.5 F (36.4 C) (02/02 0810) Temp Source: Oral (02/02 0810) BP: 94/76 (02/02 1000) Pulse Rate: 138 (02/02 1000)  Labs:  Recent Labs  09/13/16 0405 09/14/16 0231 09/15/16 0446  HGB 13.4 13.0 12.5  HCT 46.0 44.3 42.6  PLT 174 186 161  LABPROT 34.5* 32.8* 39.3*  INR 3.32 3.12 3.91  CREATININE 0.98 1.00 0.91    Estimated Creatinine Clearance: 42 mL/min (by C-G formula based on SCr of 0.91 mg/dL).  Assessment: 5 YOF admitted for SOB on PTA warfarin for Afib. Pharmacy consulted to dose warfarin. INR 3.9 > goal 2-3 need to keep > 2 after tikosyn load.  May have been elevated d/t interacting w/ Levaquin which is now discontinued. No bleeding per RN.  Tikosyn started 2./1 Afib> SR 2/2  PTA warfarin dose is 2mg  MWF, 1mg  all other days (dose very stable as outpatient per clinic records)  Goal of Therapy:  INR 2-3 Monitor platelets by anticoagulation protocol: Yes   Plan:  No Warfarin tonight. Daily INR Monitor s/sx bleeding  Bonnita Nasuti Pharm.D. CPP, BCPS Clinical Pharmacist [phone] 09/15/2016 10:22 AM

## 2016-09-15 NOTE — Progress Notes (Signed)
Mrs. Claudia [name] has been able to answer all of the orientation questions for the past two nights.  Fell asleep on nasal canula about 2300.  When awakened to do her breathing treatment at 0200 she began moaning and could not answer orientation questions, she did say "no" when I ask her if she was in pain.  We put the bbipap on and I alerted E-link about the mental status change.  She has been on BiPAP now for about 45 min and has stopped moaning and could answer orientation questions.  I will continue to monitor closely.

## 2016-09-15 NOTE — Progress Notes (Signed)
SUBJECTIVE: The patient is doing better today.  She is on Bipap O2 this AM. Sleeping. Had bradycardia yesterday with esmolol and drip stopped. Became confused and had bipap replaced.  Marland Kitchen aspirin EC  81 mg Oral Daily  . chlorhexidine  15 mL Mouth Rinse BID  . dofetilide  250 mcg Oral BID  . levalbuterol  0.63 mg Nebulization Q6H  . mouth rinse  15 mL Mouth Rinse q12n4p  . methylPREDNISolone (SOLU-MEDROL) injection  60 mg Intravenous Q12H  . pantoprazole  40 mg Oral Daily  . polyvinyl alcohol  1 drop Both Eyes TID  . sodium chloride flush  3 mL Intravenous Q12H  . sodium chloride flush  3 mL Intravenous Q12H  . sodium chloride flush  3 mL Intravenous Q12H  . Warfarin - Pharmacist Dosing Inpatient   Does not apply q1800   . esmolol 100.055 mcg/kg/min (09/15/16 1300)    OBJECTIVE: Physical Exam: Vitals:   09/15/16 1100 09/15/16 1148 09/15/16 1200 09/15/16 1300  BP: 102/70 97/63 104/79 124/76  Pulse: (!) 147 (!) 138 (!) 132 (!) 134  Resp: (!) 22 19 19 17   Temp:  97.4 F (36.3 C)    TempSrc:  Oral    SpO2: 99% 98% 97% 96%  Weight:      Height:        Intake/Output Summary (Last 24 hours) at 09/15/16 1315 Last data filed at 09/15/16 1300  Gross per 24 hour  Intake           862.63 ml  Output              595 ml  Net           267.63 ml    Telemetry reveals Coarse AFib/flutter, 130's-140's  GEN- The patient is ill appearing, alert and oriented x 3 today.   Head- normocephalic, atraumatic Eyes-  Sclera clear, conjunctiva pink Ears- hearing intact Oropharynx- clear Neck- supple, no JVP Lungs- Clear to ausculation bilaterally,  normal work of breathing Heart-  IRRR/tachycardic, no significant murmurs, no rubs or gallops GI- soft, NT, ND Extremities- no clubbing, cyanosis, or edema Skin- no rash or lesion Psych- euthymic mood, full affect Neuro- no gross deficits appreciated  LABS: Basic Metabolic Panel:  Recent Labs  09/14/16 0231 09/15/16 0446  NA 144 141    K 5.3* 5.3*  CL 99* 100*  CO2 38* 32  GLUCOSE 138* 128*  BUN 37* 43*  CREATININE 1.00 0.91  CALCIUM 9.5 9.3  MG 2.5* 2.5*  PHOS 3.8 3.6   CBC:  Recent Labs  09/14/16 0231 09/15/16 0446  WBC 8.0 5.7  HGB 13.0 12.5  HCT 44.3 42.6  MCV 95.9 94.9  PLT 186 161    Thyroid Function Tests: No results for input(s): TSH, T4TOTAL, T3FREE, THYROIDAB in the last 72 hours.  Invalid input(s): FREET3    ASSESSMENT AND PLAN:    1. Paroxysmal AFib     Bradycardic rates observed blocked PACs, home sotalol stopped yesterday     RAFib again late yesterday, no recurrent brady rates     Echo EF 60-65%, normal wall motion, trivial pericardial effusion, grade II DD     INR today is 3.91, (she had SR yesterday)     Continue to manage respiratory issues with CCM/pulmonary team     K+ 5.3     Mag 2.5     Creat 1.00 (Calc Creat Cl = 42)     EKG's reviewed,  QTc stable given  clearance will be 2107mcg dose, continue at current dose    2. Respiratory failure     Acute on chronic, COPD exacerbation     IM noted, not felt to have infectious component     Getting steroids, nebs, PRN BIPAP, on n/c O2 this AM  3. Diastolic CHF mentioned     Not felt to be actively overloaded  4. HTN     Stable, no changes   Will M. Camnitz MD 09/15/2016 1:15 PM

## 2016-09-15 NOTE — Progress Notes (Signed)
Claudia [name] went back into A-fib RVR this am at around 5:00. Esmolol restarted and titrated back up to 100 mcg/kg/min.  Pt. Is asymptomatic and BP is within normal limits.  E-link notified.  Will continue to monitor closely.

## 2016-09-15 NOTE — Progress Notes (Signed)
PULMONARY / CRITICAL CARE MEDICINE   Name: Claudia [name] MRN: [id number] DOB: [birth year]    ADMISSION DATE:  09/09/2016 CONSULTATION DATE:  09/13/16  REFERRING MD:  Claudia Kida, MD  CHIEF COMPLAINT:  Shortness of breath  HISTORY OF PRESENT ILLNESS:   Claudia [name] is a 83yo woman with PMHx of paroxysmal AFib on warfarin, COPD, chronic diastolic HF, and HTN admitted on 1/27 for shortness of breath and cough found to be hypoxic at 76% on room air. Dyspnea and cough attributed to COPD exacerbation. She was noted to be in AFib with RVR on 1/28 and responded to scheduled sotalol and IV metoprolol. On 1/30, patient's HR fell into the 30's but then without intervention went back into AFib with RVR with HR in 150s. Cardiology consulted on 1/30 with concerns of tachy-brady syndrome. She then developed respiratory distress last night and was placed on BiPAP. Patient transferred to ICU to manage her tachy-brady and respiratory failure.   Patient reports her SOB is better on BiPAP. Denies any chest pain or abdominal pain. Does note some nausea. Denies any fevers, cough, myalgias at home.   SUBJECTIVE:  Started on Tikosyn yesterday and converted for a brief period of time then back to a-fib with RVR overnight. Required BiPAP overnight for WOB  VITAL SIGNS: BP 94/76   Pulse (!) 138   Temp 97.5 F (36.4 C) (Oral)   Resp (!) 22   Ht 5\' 2"  (1.575 m)   Wt 62.1 kg (136 lb 14.5 oz)   SpO2 97%   BMI 25.04 kg/m   HEMODYNAMICS:    VENTILATOR SETTINGS: Vent Mode: BIPAP FiO2 (%):  [40 %] 40 %  INTAKE / OUTPUT: I/O last 3 completed shifts: In: 899.4 [I.V.:899.4] Out: 700 [Urine:700]  PHYSICAL EXAMINATION: General:  Elderly woman resting in bed, NAD off BiPAP Neuro: Alert and oriented x 3 HEENT:  [place]/AT, EOMI, PERRL Cardiovascular:  RRR, not in AFib currently, HR of 140-150 Lungs: Bibasilar crackles noted Abdomen:  BS+, soft, non-tender Musculoskeletal:  Moves all extremities, no peripheral  edema Skin:  Warm, dry  LABS:  BMET  Recent Labs Lab 09/13/16 0405 09/14/16 0231 09/15/16 0446  NA 141 144 141  K 4.9 5.3* 5.3*  CL 100* 99* 100*  CO2 34* 38* 32  BUN 37* 37* 43*  CREATININE 0.98 1.00 0.91  GLUCOSE 124* 138* 128*   Electrolytes  Recent Labs Lab 09/12/16 0501 09/13/16 0405 09/14/16 0231 09/15/16 0446  CALCIUM 9.5 9.3 9.5 9.3  MG 2.2  --  2.5* 2.5*  PHOS  --   --  3.8 3.6   CBC  Recent Labs Lab 09/13/16 0405 09/14/16 0231 09/15/16 0446  WBC 9.1 8.0 5.7  HGB 13.4 13.0 12.5  HCT 46.0 44.3 42.6  PLT 174 186 161   Coag's  Recent Labs Lab 09/13/16 0405 09/14/16 0231 09/15/16 0446  INR 3.32 3.12 3.91   Sepsis Markers No results for input(s): LATICACIDVEN, PROCALCITON, O2SATVEN in the last 168 hours.  ABG  Recent Labs Lab 09/12/16 1740 09/12/16 2105 09/13/16 0825  PHART 7.235* 7.285* 7.325*  PCO2ART 90.1* 76.0* 71.0*  PO2ART 105 84.6 127*   Liver Enzymes  Recent Labs Lab 09/09/16 0934  AST 23  ALT 14  ALKPHOS 78  BILITOT 0.8  ALBUMIN 4.0   Cardiac Enzymes  Recent Labs Lab 09/09/16 0934  TROPONINI <0.03   Glucose  Recent Labs Lab 09/10/16 0641 09/11/16 0547 09/12/16 0555 09/14/16 0814 09/15/16 0825  GLUCAP 101* 121* 130* 149* 126*  Imaging   STUDIES:  CXR 1/30> COPD changes, no acute abnormalities   CULTURES: RVP 1/28> negative  ANTIBIOTICS: None  SIGNIFICANT EVENTS: 1/27> Admitted with dyspnea and hypoxic respiratory failure 1/28> AFib with RVR, responded to metoprolol, sotalol 1/30>Bradycardia in 30s, then AFib with RVR in 150s 1/31> Transferred to ICU due to respiratory distress, tachy-brady  LINES/TUBES: PIV 1/27>  DISCUSSION: 83 year old female with extensive PMH who presents to PCCM with acute on chronic respiratory failure due to hypercarbia from COPD as well as a-fib with RVR who is requiring BiPAP and esmolol drips for control of both respectively.  ASSESSMENT /  PLAN:  PULMONARY A: Acute hypoxic and hypercarbic respiratory failure COPD exacerbation  P:   BiPAP to QHS given need Diureses once BP improves Transition to oxygen via [place], aim for sats in 88-92% range Duonebs Q6H Decrease Solumedrol to 30 q12h and titrate gently specially with persistent respiratory issues  CARDIOVASCULAR A:  Hx paroxysmal AFib, now with RVR and bradycardia (tachy-brady) Diastolic CHF HTN P:  Esmolol gtt per cards Will contact EP regarding plan Warfarin for AF Cardiac monitoring  RENAL A:   Metabolic alkalosis- likely reactive to respiratory acidosis Hyperkalemia P:   Follow bmet Trend UOP Replace lytes as needed Hold lasix for today  GASTROINTESTINAL A:   NPO while on BiPAP GI PPx P:   Heart healthy diet PPI with steroids  HEMATOLOGIC A:   No acute issues P:  CBC intermittently Monitor for bleeding Warfarin for [facility]  INFECTIOUS A:   No evidence of infection P:   Continue to monitor   ENDOCRINE A:   No acute issues P:   Monitor glucose on bmet Add SSI if glucose >180 consistently  NEUROLOGIC A:   No acute issues A&O x 3 P:   Continue to monitor   FAMILY  - Updates: Patient updated bedside.  Hold in ICU overnight given HR in the 150's even with esmolol drip at 100 mg, will contact EP, ?cardioversion.  The patient is critically ill with multiple organ systems failure and requires high complexity decision making for assessment and support, frequent evaluation and titration of therapies, application of advanced monitoring technologies and extensive interpretation of multiple databases.   Critical Care Time devoted to patient care services described in this note is  35  Minutes. This time reflects time of care of this signee Dr Claudia [name]. This critical care time does not reflect procedure time, or teaching time or supervisory time of PA/NP/Med student/Med Resident etc but could involve care discussion time.  Claudia [name],  M.D. [facility] Pulmonary/Critical Care Medicine. Pager: [phone]. After hours pager: [phone].

## 2016-09-15 NOTE — Care Management Note (Signed)
Case Management Note  Patient Details  Name: THEONA HIGBIE MRN: [id number] Date of Birth: 05-13-1933  Subjective/Objective:    Pt adm w copd exacerb                Action/Plan:lives w fam. Had home o2 w adult and pediatric specailists. Will ask ad and ped spec for home neb for pt.   Expected Discharge Date:                  Expected Discharge Plan:  Home/Self Care  In-House Referral:     Discharge planning Services  CM Consult  Post Acute Care Choice:  Durable Medical Equipment Choice offered to:     DME Arranged:  [place]/meds DME Agency:  [facility]  HH Arranged:    [place] Agency:     Status of Service:  In process, will continue to follow  If discussed at Long Length of Stay Meetings, dates discussed:    Additional Comments:  Lacretia Leigh, RN 09/15/2016, 6:53 AM

## 2016-09-16 ENCOUNTER — Inpatient Hospital Stay (HOSPITAL_COMMUNITY): Payer: Medicare Other

## 2016-09-16 LAB — CBC
HCT: 40.8 % (ref 36.0–46.0)
HEMOGLOBIN: 12.3 g/dL (ref 12.0–15.0)
MCH: 27.9 pg (ref 26.0–34.0)
MCHC: 30.1 g/dL (ref 30.0–36.0)
MCV: 92.5 fL (ref 78.0–100.0)
PLATELETS: 153 10*3/uL (ref 150–400)
RBC: 4.41 MIL/uL (ref 3.87–5.11)
RDW: 13.5 % (ref 11.5–15.5)
WBC: 8.2 10*3/uL (ref 4.0–10.5)

## 2016-09-16 LAB — BASIC METABOLIC PANEL
ANION GAP: 6 (ref 5–15)
BUN: 37 mg/dL — ABNORMAL HIGH (ref 6–20)
CHLORIDE: 100 mmol/L — AB (ref 101–111)
CO2: 35 mmol/L — ABNORMAL HIGH (ref 22–32)
CREATININE: 0.9 mg/dL (ref 0.44–1.00)
Calcium: 9.1 mg/dL (ref 8.9–10.3)
GFR calc non Af Amer: 58 mL/min — ABNORMAL LOW (ref >60)
Glucose, Bld: 129 mg/dL — ABNORMAL HIGH (ref 65–99)
POTASSIUM: 4.8 mmol/L (ref 3.5–5.1)
SODIUM: 141 mmol/L (ref 135–145)

## 2016-09-16 LAB — PROTIME-INR
INR: 5.32
PROTHROMBIN TIME: 50.3 s — AB (ref 11.4–15.2)

## 2016-09-16 LAB — PHOSPHORUS: Phosphorus: 2.4 mg/dL — ABNORMAL LOW (ref 2.5–4.6)

## 2016-09-16 LAB — MAGNESIUM: Magnesium: 2.3 mg/dL (ref 1.7–2.4)

## 2016-09-16 LAB — GLUCOSE, CAPILLARY: GLUCOSE-CAPILLARY: 126 mg/dL — AB (ref 65–99)

## 2016-09-16 MED ORDER — DILTIAZEM LOAD VIA INFUSION
5.0000 mg | Freq: Once | INTRAVENOUS | Status: AC
  Administered 2016-09-16: 5 mg via INTRAVENOUS
  Filled 2016-09-16: qty 5

## 2016-09-16 MED ORDER — DIGOXIN 0.25 MG/ML IJ SOLN
0.2500 mg | Freq: Once | INTRAMUSCULAR | Status: DC
Start: 2016-09-16 — End: 2016-09-24

## 2016-09-16 MED ORDER — ORAL CARE MOUTH RINSE
15.0000 mL | Freq: Two times a day (BID) | OROMUCOSAL | Status: DC
  Administered 2016-09-16 – 2016-09-29 (×23): 15 mL via OROMUCOSAL

## 2016-09-16 MED ORDER — DILTIAZEM HCL 100 MG IV SOLR
5.0000 mg/h | INTRAVENOUS | Status: DC
  Administered 2016-09-16: 15 mg/h via INTRAVENOUS
  Administered 2016-09-16: 5 mg/h via INTRAVENOUS
  Administered 2016-09-17: 10 mg/h via INTRAVENOUS
  Administered 2016-09-17 – 2016-09-18 (×6): 15 mg/h via INTRAVENOUS
  Filled 2016-09-16 (×10): qty 100

## 2016-09-16 MED ORDER — ONDANSETRON HCL 4 MG/2ML IJ SOLN: 2.0000 mg | INTRAMUSCULAR | Status: DC | PRN

## 2016-09-16 MED ORDER — IPRATROPIUM BROMIDE 0.02 % IN SOLN
0.5000 mg | Freq: Four times a day (QID) | RESPIRATORY_TRACT | Status: DC
  Administered 2016-09-16 – 2016-09-17 (×3): 0.5 mg via RESPIRATORY_TRACT
  Filled 2016-09-16 (×3): qty 2.5

## 2016-09-16 MED ORDER — DIGOXIN 0.25 MG/ML IJ SOLN
0.2500 mg | Freq: Once | INTRAMUSCULAR | Status: AC
  Administered 2016-09-16: 0.25 mg via INTRAVENOUS
  Filled 2016-09-16: qty 2

## 2016-09-16 MED ORDER — PREDNISONE 20 MG PO TABS
40.0000 mg | ORAL_TABLET | Freq: Every day | ORAL | Status: DC
  Administered 2016-09-17: 40 mg via ORAL
  Filled 2016-09-16: qty 2

## 2016-09-16 NOTE — Progress Notes (Signed)
ANTICOAGULATION CONSULT NOTE - Follow Up Consult  Pharmacy Consult for warfarin Indication: atrial fibrillation  Allergies  Allergen Reactions  . Augmentin [Amoxicillin-Pot Clavulanate] Shortness Of Breath  . Doxycycline Shortness Of Breath  . Nitrofurantoin Shortness Of Breath  . Amiodarone Hcl Nausea And Vomiting  . Clarithromycin Other (See Comments)    Crazy feeling, confusion  . Clonazepam Other (See Comments)    Doesn't remember reaction  . Codeine Nausea And Vomiting  . Diltiazem Hcl Nausea And Vomiting  . Fish Oil Nausea And Vomiting  . Flecainide Nausea Only  . Multaq [Dronedarone] Nausea Only  . Rosuvastatin Other (See Comments)    Leg cramps   . Zetia [Ezetimibe] Other (See Comments)    Leg cramps    Patient Measurements: Height: 5\' 2"  (157.5 cm) Weight: 132 lb 15 oz (60.3 kg) IBW/kg (Calculated) : 50.1  Vital Signs: Temp: 97.2 F (36.2 C) (02/03 1146) Temp Source: Oral (02/03 1146) BP: 142/88 (02/03 1146) Pulse Rate: 135 (02/03 1146)  Labs:  Recent Labs  09/14/16 0231 09/15/16 0446 09/16/16 0254  HGB 13.0 12.5 12.3  HCT 44.3 42.6 40.8  PLT 186 161 153  LABPROT 32.8* 39.3* 50.3*  INR 3.12 3.91 5.32*  CREATININE 1.00 0.91 0.90    Estimated Creatinine Clearance: 41.9 mL/min (by C-G formula based on SCr of 0.9 mg/dL).  Assessment: 19 YOF admitted for SOB on PTA warfarin for Afib. Pharmacy consulted to dose warfarin. INR 5.32 > goal 2-3 need to keep > 2 after tikosyn load.  May be elevated due to interacting w/ Levaquin which is now discontinued. No bleeding per RN.  Tikosyn started 2./1 Afib> SR 2/2  PTA warfarin dose is 2mg  MWF, 1mg  all other days (dose very stable as outpatient per clinic records)  Goal of Therapy:  INR 2-3 Monitor platelets by anticoagulation protocol: Yes   Plan:  No Warfarin tonight. Daily INR Monitor s/sx bleeding  Uvaldo Rising, BCPS  Clinical Pharmacist Pager [phone]  09/16/2016 1:00  PM

## 2016-09-16 NOTE — Progress Notes (Signed)
PULMONARY / CRITICAL CARE MEDICINE   Name: Claudia [name] MRN: [id number] DOB: [birth year]    ADMISSION DATE:  09/09/2016 CONSULTATION DATE:  09/13/16  REFERRING MD:  Ree Kida, MD  CHIEF COMPLAINT:  Shortness of breath  HISTORY OF PRESENT ILLNESS:   Claudia [name] is a 83yo woman with PMHx of paroxysmal AFib on warfarin, COPD, chronic diastolic HF, and HTN admitted on 1/27 for shortness of breath and cough found to be hypoxic at 76% on room air. Dyspnea and cough attributed to COPD exacerbation. She was noted to be in AFib with RVR on 1/28 and responded to scheduled sotalol and IV metoprolol. On 1/30, patient's HR fell into the 30's but then without intervention went back into AFib with RVR with HR in 150s. Cardiology consulted on 1/30 with concerns of tachy-brady syndrome. She then developed respiratory distress last night and was placed on BiPAP. Patient transferred to ICU to manage her tachy-brady and respiratory failure.   Patient reports her SOB is better on BiPAP. Denies any chest pain or abdominal pain. Does note some nausea. Denies any fevers, cough, myalgias at home.   SUBJECTIVE:  Now confused. Still w/ RVR C/o headache   VITAL SIGNS: BP (!) 110/59   Pulse (!) 138   Temp 98.1 F (36.7 C) (Oral)   Resp (!) 27   Ht 5\' 2"  (1.575 m)   Wt 132 lb 15 oz (60.3 kg)   SpO2 97%   BMI 24.31 kg/m   HEMODYNAMICS:    VENTILATOR SETTINGS:    INTAKE / OUTPUT: I/O last 3 completed shifts: In: 1294.7 [P.O.:360; I.V.:934.7] Out: 1020 [Urine:1020]  PHYSICAL EXAMINATION: General: elderly female, resting in bed. Does have some mild accessory use Neuro: awake, oriented X1-2, having more episodes of confusion, moves all extremities HENT> NCAT, no JVD, MMM Card: tachy rrr, irreg. No murmur Lungs: mild accessory use. Crackles posterior bases., no wheeze  Abd: soft, not tender. No OM  MS: warm,dry, brisk CR.  Derm: warm and dry   LABS:  BMET  Recent Labs Lab 09/14/16 0231  09/15/16 0446 09/16/16 0254  NA 144 141 141  K 5.3* 5.3* 4.8  CL 99* 100* 100*  CO2 38* 32 35*  BUN 37* 43* 37*  CREATININE 1.00 0.91 0.90  GLUCOSE 138* 128* 129*   Electrolytes  Recent Labs Lab 09/14/16 0231 09/15/16 0446 09/16/16 0254  CALCIUM 9.5 9.3 9.1  MG 2.5* 2.5* 2.3  PHOS 3.8 3.6 2.4*   CBC  Recent Labs Lab 09/14/16 0231 09/15/16 0446 09/16/16 0254  WBC 8.0 5.7 8.2  HGB 13.0 12.5 12.3  HCT 44.3 42.6 40.8  PLT 186 161 153   Coag's  Recent Labs Lab 09/14/16 0231 09/15/16 0446 09/16/16 0254  INR 3.12 3.91 5.32*   Sepsis Markers No results for input(s): LATICACIDVEN, PROCALCITON, O2SATVEN in the last 168 hours.  ABG  Recent Labs Lab 09/12/16 1740 09/12/16 2105 09/13/16 0825  PHART 7.235* 7.285* 7.325*  PCO2ART 90.1* 76.0* 71.0*  PO2ART 105 84.6 127*   Liver Enzymes No results for input(s): AST, ALT, ALKPHOS, BILITOT, ALBUMIN in the last 168 hours. Cardiac Enzymes No results for input(s): TROPONINI, PROBNP in the last 168 hours. Glucose  Recent Labs Lab 09/10/16 0641 09/11/16 0547 09/12/16 0555 09/14/16 0814 09/15/16 0825 09/16/16 0818  GLUCAP 101* 121* 130* 149* 126* 126*   Imaging   STUDIES:  CXR 1/30> COPD changes, no acute abnormalities   CULTURES: RVP 1/28> negative  ANTIBIOTICS: None  SIGNIFICANT EVENTS: 1/27> Admitted with  dyspnea and hypoxic respiratory failure 1/28> AFib with RVR, responded to metoprolol, sotalol 1/30>Bradycardia in 30s, then AFib with RVR in 150s 1/31> Transferred to ICU due to respiratory distress, tachy-brady  LINES/TUBES: PIV 1/27>   ASSESSMENT / PLAN:  Acute hypoxic and hypercarbic respiratory failure in setting of AECOPD and probably further exacerbated by AF w/ RVR Plan Change BIPAP to PRN Cont xopenex and atrovent  Slow steroid taper Wean O2 rx afib  Repeat CXR   afib w/ RVR H/o PAF H/o diastolic dysfxn -EP following. Have not been able to get HR under control w/ esmolol.  Apparently has some "allergy" to CCB but unclear what Plan Warfarin for AF Cardiac monitoring Dilt, CCB gtt & Tikosyn   Coumadin induced coagulopathy Plan Adjust coumadin per pharmacy   Metabolic alkalosis- likely reactive to respiratory acidosis Hyperkalemia Plan:   Follow bmet Trend UOP Replace lytes as needed  Delirium Plan Decrease steroids Maximize sleep wake cycle Supportive care  FAMILY  - Updates: Patient updated bedside.  DISCUSSION: 83 year old female with extensive PMH who presents to PCCM with acute on chronic respiratory failure due to hypercarbia from COPD as well as a-fib with RVR. Our service as was requiring BiPAP and esmolol drips for control of both respectively. We have yet to get her HR under control. She is now developing some delirium. Suspect that this is a mix of sleep deprivation and steroids. I will repeat her CXR today to look for evidence of pulmonary edema. Might benefit from lasix. She is DNR. I am concerned that she may be fatiguing and that she can't tolerate this HR much longer.   My critical care time 32 minutes.   Erick Colace ACNP-BC [place] Pager # [phone] OR # [phone] if no answer   ATTENDING NOTE / ATTESTATION NOTE :   I have discussed the case with the resident/APP  Claudia Griffon NP  I agree with the resident/APP's  history, physical examination, assessment, and plans.    I have edited the above note and modified it according to our agreed history, physical examination, assessment and plan.   Pt is a 83yo woman with PMHx of paroxysmal AFib on warfarin, COPD, chronic diastolic HF, and HTN admitted on 1/27 for shortness of breath and cough found to be hypoxic at 76% on room air. Dyspnea and cough attributed to COPD exacerbation. She was noted to be in AFib with RVR on 1/28 and responded to scheduled sotalol and IV metoprolol. On 1/30, patient's HR fell into the 30's but then without intervention went back  into AFib with RVR with HR in 150s. Cardiology consulted on 1/30 with concerns of tachy-brady syndrome. Pt ransferred to ICU to manage resp failure. On and off bipap. Confusion, on and off.   Vitals:  Vitals:   09/16/16 1430 09/16/16 1445 09/16/16 1500 09/16/16 1515  BP: (!) 139/91 (!) 104/56 (!) 110/94 (!) 131/109  Pulse: (!) 119 (!) 112 (!) 107 (!) 105  Resp: (!) 21 (!) 23 20 20   Temp:      TempSrc:      SpO2: 97% 100% 100% 99%  Weight:      Height:        Constitutional/General: well-nourished, well-developed, on and off confusion. Mild resp distress  Body mass index is 24.31 kg/m. Wt Readings from Last 3 Encounters:  09/16/16 60.3 kg (132 lb 15 oz)  09/06/16 61.8 kg (136 lb 3.2 oz)  07/27/16 61.3 kg (135 lb 3.2 oz)  HEENT: PERLA, anicteric sclerae. (-) Oral thrush. In mild distress.   Neck: No masses. Midline trachea. No JVD, (-) LAD. (-) bruits appreciated.  Respiratory/Chest: Grossly normal chest. (-) deformity. (-) Accessory muscle use.  Symmetric expansion. Diminished BS on both lower lung zones. Occasional wheezing  Cardiovascular: Regular rate and  rhythm, heart sounds normal, no murmur or gallops,  Trace peripheral edema  Gastrointestinal:  Normal bowel sounds. Soft, non-tender. No hepatosplenomegaly.  (-) masses.   Musculoskeletal:  Normal muscle tone.   Extremities: Grossly normal. (-) clubbing, cyanosis.  (-) edema  Skin: (-) rash,lesions seen.   Neurological/Psychiatric : sedated, intubated. CN grossly intact. (-) lateralizing signs.    CBC Recent Labs     09/14/16  0231  09/15/16  0446  09/16/16  0254  WBC  8.0  5.7  8.2  HGB  13.0  12.5  12.3  HCT  44.3  42.6  40.8  PLT  186  161  153    Coag's Recent Labs     09/14/16  0231  09/15/16  0446  09/16/16  0254  INR  3.12  3.91  5.32*    BMET Recent Labs     09/14/16  0231  09/15/16  0446  09/16/16  0254  NA  144  141  141  K  5.3*  5.3*  4.8  CL  99*  100*  100*  CO2  38*   32  35*  BUN  37*  43*  37*  CREATININE  1.00  0.91  0.90  GLUCOSE  138*  128*  129*    Electrolytes Recent Labs     09/14/16  0231  09/15/16  0446  09/16/16  0254  CALCIUM  9.5  9.3  9.1  MG  2.5*  2.5*  2.3  PHOS  3.8  3.6  2.4*    Sepsis Markers No results for input(s): PROCALCITON, O2SATVEN in the last 72 hours.  Invalid input(s): LACTICACIDVEN  ABG No results for input(s): PHART, PCO2ART, PO2ART in the last 72 hours.  Liver Enzymes No results for input(s): AST, ALT, ALKPHOS, BILITOT, ALBUMIN in the last 72 hours.  Cardiac Enzymes No results for input(s): TROPONINI, PROBNP in the last 72 hours.  Glucose Recent Labs     09/14/16  0814  09/15/16  0825  09/16/16  0818  GLUCAP  149*  126*  126*    Imaging Dg Chest Port 1 View  Result Date: 09/16/2016 CLINICAL DATA:  Shortness of breath, respiratory failure, hypoxia and COPD exacerbation. Atrial fibrillation with rapid ventricular response. EXAM: PORTABLE CHEST 1 VIEW COMPARISON:  09/15/2016 FINDINGS: The heart size and mediastinal contours are within normal limits. Stable emphysematous lung disease. There is no evidence of pulmonary edema, consolidation, pneumothorax, nodule or pleural fluid. The visualized skeletal structures are unremarkable. IMPRESSION: Stable emphysema. Electronically Signed   By: Aletta Edouard M.D.   On: 09/16/2016 14:11   Dg Chest Port 1 View  Result Date: 09/15/2016 CLINICAL DATA:  Respiratory failure. EXAM: PORTABLE CHEST 1 VIEW COMPARISON:  09/12/2016.  01/31/2016. FINDINGS: Mediastinum hilar structures normal. Heart size normal. Bilateral pleural-parenchymal thickening with scarring again noted. COPD. No pneumothorax. IMPRESSION: Bilateral pleural-parenchymal thickening again noted without interim change. Findings consistent with scarring. COPD . Electronically Signed   By: Marcello Moores  Register   On: 09/15/2016 06:44   Assessment/Plan :  Acute hypoxemic resp failure 2/2 AECOPD + Pulm edema -  bipap prn. Keep o2 sats > 88% - cont neb meds (atrovent /  xopenex) - cont prednisone  Atrial Fibrillation  - cont diltiazem, digoxin, tikosyn - cont coumadin  Delirium - supportive care - observe for now   Family : No family at bedside.    Monica Becton, MD 09/16/2016, 3:55 PM [facility] Pager [phone] After 3 pm or if no answer, call [phone]

## 2016-09-16 NOTE — Progress Notes (Signed)
Dr. Lovena Le notified of continued heart rate 140s despite maximum dose of esmolol.  Patient now experiencing some shortness of breath and respiratory rate in mid 20s.  Orders received.  Will continue to monitor pt closely.

## 2016-09-16 NOTE — Progress Notes (Signed)
Patient Name: Claudia [name] Date of Encounter: 09/16/2016     Principal Problem:   COPD exacerbation ([place]) Active Problems:   Paroxysmal atrial fibrillation ([facility])   HTN (hypertension)   Acute on chronic respiratory failure ([facility])   Chronic diastolic CHF (congestive heart failure) ([place])   COPD with acute exacerbation (H[facility])   Hypoxia   Respiratory distress   Tachy-brady syndrome ([place])    SUBJECTIVE  Minimal dyspnea. Feels weak.  CURRENT MEDS . aspirin EC  81 mg Oral Daily  . chlorhexidine  15 mL Mouth Rinse BID  . dofetilide  250 mcg Oral BID  . levalbuterol  0.63 mg Nebulization Q6H  . mouth rinse  15 mL Mouth Rinse q12n4p  . methylPREDNISolone (SOLU-MEDROL) injection  60 mg Intravenous Q12H  . pantoprazole  40 mg Oral Daily  . polyvinyl alcohol  1 drop Both Eyes TID  . sodium chloride flush  3 mL Intravenous Q12H  . sodium chloride flush  3 mL Intravenous Q12H  . sodium chloride flush  3 mL Intravenous Q12H  . Warfarin - Pharmacist Dosing Inpatient   Does not apply q1800    OBJECTIVE  Vitals:   09/16/16 0500 09/16/16 0600 09/16/16 0700 09/16/16 0826  BP: 136/90 117/73 132/84 108/87  Pulse: (!) 148 (!) 136 (!) 136 (!) 141  Resp: 19 (!) 22 19 (!) 25  Temp:    98.1 F (36.7 C)  TempSrc:    Oral  SpO2: 97% 94% 97% 96%  Weight:      Height:        Intake/Output Summary (Last 24 hours) at 09/16/16 0828 Last data filed at 09/16/16 0700  Gross per 24 hour  Intake            889.3 ml  Output              745 ml  Net            144.3 ml   Filed Weights   09/14/16 0305 09/15/16 0400 09/16/16 0401  Weight: 134 lb 4.2 oz (60.9 kg) 136 lb 14.5 oz (62.1 kg) 132 lb 15 oz (60.3 kg)    PHYSICAL EXAM  General: Pleasant, frail appearing, NAD. Neuro: Alert and oriented X 3. Moves all extremities spontaneously. Psych: blunted HEENT:  [place]/AT  Neck: Supple without bruits or JVD. Lungs:  Resp without increased work of breathing Heart: IRIR tachy no s3, s4, or  murmurs. Abdomen: Soft, non-tender, non-distended, BS + x 4.  Extremities: No clubbing, cyanosis or edema. DP/PT/Radials 2+ and equal bilaterally.  Accessory Clinical Findings  CBC  Recent Labs  09/15/16 0446 09/16/16 0254  WBC 5.7 8.2  HGB 12.5 12.3  HCT 42.6 40.8  MCV 94.9 92.5  PLT 161 0000000   Basic Metabolic Panel  Recent Labs  09/15/16 0446 09/16/16 0254  NA 141 141  K 5.3* 4.8  CL 100* 100*  CO2 32 35*  GLUCOSE 128* 129*  BUN 43* 37*  CREATININE 0.91 0.90  CALCIUM 9.3 9.1  MG 2.5* 2.3  PHOS 3.6 2.4*   Liver Function Tests No results for input(s): AST, ALT, ALKPHOS, BILITOT, PROT, ALBUMIN in the last 72 hours. No results for input(s): LIPASE, AMYLASE in the last 72 hours. Cardiac Enzymes No results for input(s): CKTOTAL, CKMB, CKMBINDEX, TROPONINI in the last 72 hours. BNP Invalid input(s): POCBNP D-Dimer No results for input(s): DDIMER in the last 72 hours. Hemoglobin A1C No results for input(s): HGBA1C in the last 72 hours.  Fasting Lipid Panel No results for input(s): CHOL, HDL, LDLCALC, TRIG, CHOLHDL, LDLDIRECT in the last 72 hours. Thyroid Function Tests No results for input(s): TSH, T4TOTAL, T3FREE, THYROIDAB in the last 72 hours.  Invalid input(s): FREET3  TELE  Coarse atrial fib with a RVR  Radiology/Studies  Dg Chest 2 View  Result Date: 09/09/2016 CLINICAL DATA:  Cough for 1 week, shortness of Breath EXAM: CHEST  2 VIEW COMPARISON:  01/31/2016 FINDINGS: There is hyperinflation of the lungs compatible with COPD. Scarring in the lingula and right upper lobe. Heart is upper limits normal in size. No acute airspace opacities or effusions. No acute bony abnormality. IMPRESSION: COPD/chronic changes.  No active disease. Electronically Signed   By: Rolm Baptise M.D.   On: 09/09/2016 09:29   Dg Chest Port 1 View  Result Date: 09/15/2016 CLINICAL DATA:  Respiratory failure. EXAM: PORTABLE CHEST 1 VIEW COMPARISON:  09/12/2016.  01/31/2016. FINDINGS:  Mediastinum hilar structures normal. Heart size normal. Bilateral pleural-parenchymal thickening with scarring again noted. COPD. No pneumothorax. IMPRESSION: Bilateral pleural-parenchymal thickening again noted without interim change. Findings consistent with scarring. COPD . Electronically Signed   By: Marcello Moores  Register   On: 09/15/2016 06:44   Dg Chest Port 1 View  Result Date: 09/12/2016 CLINICAL DATA:  Shortness of breath. EXAM: PORTABLE CHEST 1 VIEW COMPARISON:  Radiograph of same day. FINDINGS: Stable cardiomediastinal silhouette is noted. No pneumothorax or pleural effusion is noted. No acute pulmonary disease is noted. Stable bilateral lung scarring is noted. No acute pulmonary disease is noted. Bony thorax is unremarkable. IMPRESSION: No acute cardiopulmonary abnormality seen. Electronically Signed   By: Marijo Conception, M.D.   On: 09/12/2016 19:45   Dg Chest Port 1 View  Result Date: 09/12/2016 CLINICAL DATA:  Shortness of breath, abnormal lung sounds at the bases bilaterally. History of COPD, atrial flutter, CHF. EXAM: PORTABLE CHEST 1 VIEW COMPARISON:  PA and lateral chest x-ray of September 09, 2016 FINDINGS: The lungs remain mildly hyperinflated. The interstitial markings remain coarse especially in the right upper lobe. There is no alveolar infiltrate or pleural effusion. The cardiac silhouette is top-normal in size. The pulmonary vascularity is normal. There is calcification in the wall of the aortic arch. The trachea is midline. The bony thorax exhibits no acute abnormality. There are chronic changes of both shoulders. IMPRESSION: COPD.  Mild cardiomegaly.  No pneumonia nor pulmonary edema. Thoracic aortic atherosclerosis. Electronically Signed   By: David  Martinique M.D.   On: 09/12/2016 08:35    ASSESSMENT AND PLAN  1. Uncontrolled atrial fib - she will continue her Tikosyn. She will need AV nodal blocking drugs for rate control. Will restart Esmolol. Unclear what the allergy to IV  diltiazem is. 2. COPD - stable. She is not actively wheezing. She will need to hold nebulizer until HR under 120.   Carleene Overlie Taylor,M.D.  09/16/2016 8:28 AMPatient ID: Daryel November, female   DOB: March 17, 1933, 83 y.o.   MRN: [id number]

## 2016-09-17 DIAGNOSIS — I482 Chronic atrial fibrillation, unspecified: Secondary | ICD-10-CM | POA: Diagnosis present

## 2016-09-17 LAB — BASIC METABOLIC PANEL
Anion gap: 8 (ref 5–15)
BUN: 32 mg/dL — ABNORMAL HIGH (ref 6–20)
CHLORIDE: 97 mmol/L — AB (ref 101–111)
CO2: 34 mmol/L — ABNORMAL HIGH (ref 22–32)
CREATININE: 0.88 mg/dL (ref 0.44–1.00)
Calcium: 8.7 mg/dL — ABNORMAL LOW (ref 8.9–10.3)
GFR, EST NON AFRICAN AMERICAN: 60 mL/min — AB (ref >60)
Glucose, Bld: 112 mg/dL — ABNORMAL HIGH (ref 65–99)
POTASSIUM: 4.3 mmol/L (ref 3.5–5.1)
SODIUM: 139 mmol/L (ref 135–145)

## 2016-09-17 LAB — GLUCOSE, CAPILLARY: GLUCOSE-CAPILLARY: 101 mg/dL — AB (ref 65–99)

## 2016-09-17 LAB — PROTIME-INR
INR: 4.86
Prothrombin Time: 46.7 seconds — ABNORMAL HIGH (ref 11.4–15.2)

## 2016-09-17 LAB — MAGNESIUM: Magnesium: 2.3 mg/dL (ref 1.7–2.4)

## 2016-09-17 MED ORDER — LEVALBUTEROL HCL 0.63 MG/3ML IN NEBU
0.6300 mg | INHALATION_SOLUTION | Freq: Four times a day (QID) | RESPIRATORY_TRACT | Status: DC | PRN
Start: 2016-09-17 — End: 2016-09-25
  Administered 2016-09-20: 0.63 mg via RESPIRATORY_TRACT
  Filled 2016-09-17: qty 3

## 2016-09-17 MED ORDER — PREDNISONE 20 MG PO TABS
30.0000 mg | ORAL_TABLET | Freq: Every day | ORAL | Status: DC
  Administered 2016-09-18 – 2016-09-20 (×3): 30 mg via ORAL
  Filled 2016-09-17 (×4): qty 1

## 2016-09-17 MED ORDER — LEVALBUTEROL HCL 0.63 MG/3ML IN NEBU
0.6300 mg | INHALATION_SOLUTION | Freq: Three times a day (TID) | RESPIRATORY_TRACT | Status: DC
  Administered 2016-09-17 – 2016-09-18 (×4): 0.63 mg via RESPIRATORY_TRACT
  Filled 2016-09-17 (×4): qty 3

## 2016-09-17 MED ORDER — IPRATROPIUM BROMIDE 0.02 % IN SOLN
0.5000 mg | Freq: Three times a day (TID) | RESPIRATORY_TRACT | Status: DC
  Administered 2016-09-17 – 2016-09-18 (×4): 0.5 mg via RESPIRATORY_TRACT
  Filled 2016-09-17 (×5): qty 2.5

## 2016-09-17 MED ORDER — ENSURE ENLIVE PO LIQD
237.0000 mL | Freq: Two times a day (BID) | ORAL | Status: DC
  Administered 2016-09-17 – 2016-09-28 (×17): 237 mL via ORAL

## 2016-09-17 NOTE — Progress Notes (Signed)
PULMONARY / CRITICAL CARE MEDICINE   Name: Claudia [name] MRN: [id number] DOB: 01-Aug-1933    ADMISSION DATE:  09/09/2016 CONSULTATION DATE:  09/13/16  REFERRING MD:  Ree Kida, MD  CHIEF COMPLAINT:  Shortness of breath  HISTORY OF PRESENT ILLNESS:   Claudia [name] is a 83yo woman with PMHx of paroxysmal AFib on warfarin, COPD, chronic diastolic HF, and HTN admitted on 1/27 for shortness of breath and cough found to be hypoxic at 76% on room air. Dyspnea and cough attributed to COPD exacerbation. She was noted to be in AFib with RVR on 1/28 and responded to scheduled sotalol and IV metoprolol. On 1/30, patient's HR fell into the 30's but then without intervention went back into AFib with RVR with HR in 150s. Cardiology consulted on 1/30 with concerns of tachy-brady syndrome. She then developed respiratory distress last night and was placed on BiPAP. Patient transferred to ICU to manage her tachy-brady and respiratory failure.   Patient reports her SOB is better on BiPAP. Denies any chest pain or abdominal pain. Does note some nausea. Denies any fevers, cough, myalgias at home.   SUBJECTIVE:  Feels better.  HR dropped down to 30s a couple of times last night  VITAL SIGNS: BP 128/67   Pulse (!) 118   Temp 97.5 F (36.4 C) (Oral)   Resp (!) 22   Ht 5\' 2"  (1.575 m)   Wt 133 lb 13.1 oz (60.7 kg)   SpO2 95%   BMI 24.48 kg/m  2 liters  HEMODYNAMICS:    VENTILATOR SETTINGS:    INTAKE / OUTPUT: I/O last 3 completed shifts: In: 1351.1 [P.O.:600; I.V.:751.1] Out: [id number] [Urine:1375]  General appearance:  Frail 83 Year old  Female, NAD,  conversant  Eyes: anicteric sclerae, moist conjunctivae; PERRL, EOMI bilaterally. Mouth:  membranes and no mucosal ulcerations; normal hard and soft palate Neck: Trachea midline; neck supple, no JVD Lungs/chest: CTA, no wheeze, with normal respiratory effort and no intercostal retractions CV: irreg irreg, no MRGs  Abdomen: Soft, non-tender; no masses or  HSM Extremities: No peripheral edema or extremity lymphadenopathy Skin: Normal temperature, turgor and texture; no rash, ulcers or subcutaneous nodules Psych: Appropriate affect, alert and oriented to person, place and time LABS:  BMET  Recent Labs Lab 09/15/16 0446 09/16/16 0254 09/17/16 0209  NA 141 141 139  K 5.3* 4.8 4.3  CL 100* 100* 97*  CO2 32 35* 34*  BUN 43* 37* 32*  CREATININE 0.91 0.90 0.88  GLUCOSE 128* 129* 112*   Electrolytes  Recent Labs Lab 09/14/16 0231 09/15/16 0446 09/16/16 0254 09/17/16 0209  CALCIUM 9.5 9.3 9.1 8.7*  MG 2.5* 2.5* 2.3 2.3  PHOS 3.8 3.6 2.4*  --    CBC  Recent Labs Lab 09/14/16 0231 09/15/16 0446 09/16/16 0254  WBC 8.0 5.7 8.2  HGB 13.0 12.5 12.3  HCT 44.3 42.6 40.8  PLT 186 161 153   Coag's  Recent Labs Lab 09/15/16 0446 09/16/16 0254 09/17/16 0209  INR 3.91 5.32* 4.86*   Sepsis Markers No results for input(s): LATICACIDVEN, PROCALCITON, O2SATVEN in the last 168 hours.  ABG  Recent Labs Lab 09/12/16 1740 09/12/16 2105 09/13/16 0825  PHART 7.235* 7.285* 7.325*  PCO2ART 90.1* 76.0* 71.0*  PO2ART 105 84.6 127*   Liver Enzymes No results for input(s): AST, ALT, ALKPHOS, BILITOT, ALBUMIN in the last 168 hours. Cardiac Enzymes No results for input(s): TROPONINI, PROBNP in the last 168 hours. Glucose  Recent Labs Lab 09/11/16 0547 09/12/16 0555 09/14/16  [id number] 09/15/16 0825 09/16/16 0818 09/17/16 0753  GLUCAP 121* 130* 149* 126* 126* 101*   Imaging I personally reviewed the CXR from 2/3: here is no edema or CXR. Stable film  STUDIES:  CXR 1/30> COPD changes, no acute abnormalities   CULTURES: RVP 1/28> negative  ANTIBIOTICS: None  SIGNIFICANT EVENTS: 1/27> Admitted with dyspnea and hypoxic respiratory failure 1/28> AFib with RVR, responded to metoprolol, sotalol 1/30>Bradycardia in 30s, then AFib with RVR in 150s 1/31> Transferred to ICU due to respiratory distress, tachy-brady 2/3 started  on CCB gtt 2/4: looks better. Rate controlled.    LINES/TUBES: PIV 1/27>   ASSESSMENT / PLAN:  Acute hypoxic and hypercarbic respiratory failure in the setting of AECOPD, further exacerbated by AF w/ RVR Plan Dc bipap Cont xopenex and atrovent  Wean )2 Slow taper abx rx af Mobilize  Af w/ RVR and sss H/o diastolic HF Plan Cont ccb per cards Cont tele Dig/ccb/tikosyn Coumadin per pharmacy   Metabolic alkalosis in setting of chronic hypercarbia and probably exacerbated by diuretics  Plan Holding lasix F/u am chem  delirium  Plan  Cont supportive care  Decrease steroids   FAMILY  - Updates: Patient updated bedside.  DISCUSSION: Looks better w/ rate controlled.  Delirium a little better Will keep in ICU given labile HR Decrease steroids Get OOB Add ensure as PO intake is poor  Erick Colace ACNP-BC [facility] Pager # [phone] OR # [phone] if no answer   ATTENDING NOTE / ATTESTATION NOTE :   I have discussed the case with the resident/APP  Claudia Griffon NP.  I agree with the resident/APP's  history, physical examination, assessment, and plans.    I have edited the above note and modified it according to our agreed history, physical examination, assessment and plan.   Pt is a 83yo woman with PMHx of paroxysmal AFib on warfarin, COPD, chronic diastolic HF, and HTN admitted on 1/27 for shortness of breath and cough found to be hypoxic at 76% on room air. Dyspnea and cough attributed to COPD exacerbation. She was noted to be in AFib with RVR on 1/28 and responded to scheduled sotalol and IV metoprolol. On 1/30, patient's HR fell into the 30's but then without intervention went back into AFib with RVR with HR in 150s. Cardiology consulted on 1/30 with concerns of tachy-brady syndrome. Pt ransferred to ICU to manage resp failure. On and off bipap. Confusion.   Did not wear bipap last night. Less confused today. (-) subjective complaints.  Still on diltiazem drip.   Vitals:  Vitals:   09/17/16 1200 09/17/16 1238 09/17/16 1300 09/17/16 1617  BP: (!) 145/76  129/84 (!) 125/93  Pulse: 93  98 (!) 156  Resp: (!) 21  (!) 25 (!) 25  Temp:    98.1 F (36.7 C)  TempSrc:    Oral  SpO2: 97% 96% 96% 94%  Weight:      Height:        Constitutional/General: chronically ill,  not in any distress, oriented x 3  Body mass index is 24.48 kg/m. Wt Readings from Last 3 Encounters:  09/17/16 60.7 kg (133 lb 13.1 oz)  09/06/16 61.8 kg (136 lb 3.2 oz)  07/27/16 61.3 kg (135 lb 3.2 oz)    HEENT: PERLA, anicteric sclerae. (-) Oral thrush.  Neck: No masses. Midline trachea. No JVD, (-) LAD. (-) bruits appreciated.  Respiratory/Chest: Grossly normal chest. (-) deformity. (-) Accessory muscle use.  Symmetric expansion. Diminished BS on  both lower lung zones. (-) wheezing,rhonchi. Some crackles at bases (-) egophony  Cardiovascular: variable s1, heart sounds normal, no murmur or gallops,  Trace peripheral edema  Gastrointestinal:  Normal bowel sounds. Soft, non-tender. No hepatosplenomegaly.  (-) masses.   Musculoskeletal:  Normal muscle tone.   Extremities: Grossly normal. (-) clubbing, cyanosis.  (-) edema  Skin: (-) rash,lesions seen.   Neurological/Psychiatric : sedated, intubated. CN grossly intact. (-) lateralizing signs.   CBC Recent Labs     09/15/16  0446  09/16/16  0254  WBC  5.7  8.2  HGB  12.5  12.3  HCT  42.6  40.8  PLT  161  153    Coag's Recent Labs     09/15/16  0446  09/16/16  0254  09/17/16  0209  INR  3.91  5.32*  4.86*    BMET Recent Labs     09/15/16  0446  09/16/16  0254  09/17/16  0209  NA  141  141  139  K  5.3*  4.8  4.3  CL  100*  100*  97*  CO2  32  35*  34*  BUN  43*  37*  32*  CREATININE  0.91  0.90  0.88  GLUCOSE  128*  129*  112*    Electrolytes Recent Labs     09/15/16  0446  09/16/16  0254  09/17/16  0209  CALCIUM  9.3  9.1  8.7*  MG  2.5*  2.3  2.3  PHOS   3.6  2.4*   --     Sepsis Markers No results for input(s): PROCALCITON, O2SATVEN in the last 72 hours.  Invalid input(s): LACTICACIDVEN  ABG No results for input(s): PHART, PCO2ART, PO2ART in the last 72 hours.  Liver Enzymes No results for input(s): AST, ALT, ALKPHOS, BILITOT, ALBUMIN in the last 72 hours.  Cardiac Enzymes No results for input(s): TROPONINI, PROBNP in the last 72 hours.  Glucose Recent Labs     09/15/16  0825  09/16/16  0818  09/17/16  0753  GLUCAP  126*  126*  101*    Imaging Dg Chest Port 1 View  Result Date: 09/16/2016 CLINICAL DATA:  Shortness of breath, respiratory failure, hypoxia and COPD exacerbation. Atrial fibrillation with rapid ventricular response. EXAM: PORTABLE CHEST 1 VIEW COMPARISON:  09/15/2016 FINDINGS: The heart size and mediastinal contours are within normal limits. Stable emphysematous lung disease. There is no evidence of pulmonary edema, consolidation, pneumothorax, nodule or pleural fluid. The visualized skeletal structures are unremarkable. IMPRESSION: Stable emphysema. Electronically Signed   By: Aletta Edouard M.D.   On: 09/16/2016 14:11   Assessment/Plan :  S/P Acute hypoxemic resp failure 2/2 AECOPD + Pulm edema - bipap prn. Keep o2 sats > 88%. Did not wear bipap last night. - cont neb meds (atrovent / xopenex) - cont prednisone  Atrial Fibrillation  - cont diltiazem, digoxin, tikosyn.  - RN increased diltiazem drip today and HR dropped in the 30s.  Diltiazem drip decreased and HR improved.  - Noted plans for possible PPM.  - cont coumadin  Delirium - supportive care - observe for now - her delirium is improved.    Family :  No family at bedside. Plan d/w pt.  Hopefully, we can transfer pt to SDU soon.    Monica Becton, MD 09/17/2016, 5:24 PM [facility] Pager [phone] After 3 pm or if no answer, call [phone]

## 2016-09-17 NOTE — Progress Notes (Signed)
ANTICOAGULATION CONSULT NOTE - Follow Up Consult  Pharmacy Consult for warfarin Indication: atrial fibrillation  Allergies  Allergen Reactions  . Augmentin [Amoxicillin-Pot Clavulanate] Shortness Of Breath  . Doxycycline Shortness Of Breath  . Nitrofurantoin Shortness Of Breath  . Amiodarone Hcl Nausea And Vomiting  . Clarithromycin Other (See Comments)    Crazy feeling, confusion  . Clonazepam Other (See Comments)    Doesn't remember reaction  . Codeine Nausea And Vomiting  . Diltiazem Hcl Nausea And Vomiting  . Fish Oil Nausea And Vomiting  . Flecainide Nausea Only  . Multaq [Dronedarone] Nausea Only  . Rosuvastatin Other (See Comments)    Leg cramps   . Zetia [Ezetimibe] Other (See Comments)    Leg cramps    Patient Measurements: Height: 5\' 2"  (157.5 cm) Weight: 133 lb 13.1 oz (60.7 kg) IBW/kg (Calculated) : 50.1  Vital Signs: Temp: 97.5 F (36.4 C) (02/04 0353) Temp Source: Oral (02/04 0353) BP: 98/53 (02/04 0600) Pulse Rate: 132 (02/04 0600)  Labs:  Recent Labs  09/15/16 0446 09/16/16 0254 09/17/16 0209  HGB 12.5 12.3  --   HCT 42.6 40.8  --   PLT 161 153  --   LABPROT 39.3* 50.3* 46.7*  INR 3.91 5.32* 4.86*  CREATININE 0.91 0.90 0.88    Estimated Creatinine Clearance: 43 mL/min (by C-G formula based on SCr of 0.88 mg/dL).  Assessment: 62 YOF admitted for SOB on PTA warfarin for Afib. Pharmacy consulted to dose warfarin. INR 4.86 > goal 2-3 need to keep > 2 after tikosyn load.  May be elevated due to interacting w/ Levaquin which is now discontinued. No bleeding per RN.  Tikosyn started 2/1 Afib> SR 2/2  PTA warfarin dose is 2mg  MWF, 1mg  all other days (dose very stable as outpatient per clinic records)  Goal of Therapy:  INR 2-3 Monitor platelets by anticoagulation protocol: Yes   Plan:  No Warfarin tonight. Daily INR Monitor s/sx bleeding  Uvaldo Rising, BCPS  Clinical Pharmacist Pager [phone]  09/17/2016 9:04  AM

## 2016-09-17 NOTE — Progress Notes (Signed)
Patient Name: Claudia [name] Date of Encounter: 09/17/2016     Principal Problem:   COPD exacerbation ([place]) Active Problems:   Paroxysmal atrial fibrillation (H[facility])   HTN (hypertension)   Acute on chronic respiratory failure ([facility])   Chronic diastolic CHF (congestive heart failure) ([place])   COPD with acute exacerbation (H[facility])   Hypoxia   Respiratory distress   Tachy-brady syndrome ([place])    SUBJECTIVE  No chest pain or sob. HR improved.  CURRENT MEDS . aspirin EC  81 mg Oral Daily  . digoxin  0.25 mg Intravenous Once  . dofetilide  250 mcg Oral BID  . ipratropium  0.5 mg Nebulization TID  . levalbuterol  0.63 mg Nebulization TID  . mouth rinse  15 mL Mouth Rinse BID  . pantoprazole  40 mg Oral Daily  . polyvinyl alcohol  1 drop Both Eyes TID  . predniSONE  40 mg Oral Q breakfast  . sodium chloride flush  3 mL Intravenous Q12H  . sodium chloride flush  3 mL Intravenous Q12H  . sodium chloride flush  3 mL Intravenous Q12H  . Warfarin - Pharmacist Dosing Inpatient   Does not apply q1800    OBJECTIVE  Vitals:   09/17/16 0500 09/17/16 0600 09/17/16 0800 09/17/16 0818  BP: (!) 103/57 (!) 98/53 123/88   Pulse: 94 (!) 132 (!) 57   Resp: 18 18 20    Temp:   97.5 F (36.4 C)   TempSrc:   Oral   SpO2: 97% 97% 98% 99%  Weight: 133 lb 13.1 oz (60.7 kg)     Height:        Intake/Output Summary (Last 24 hours) at 09/17/16 1007 Last data filed at 09/17/16 0900  Gross per 24 hour  Intake           720.21 ml  Output             1100 ml  Net          -379.79 ml   Filed Weights   09/15/16 0400 09/16/16 0401 09/17/16 0500  Weight: 136 lb 14.5 oz (62.1 kg) 132 lb 15 oz (60.3 kg) 133 lb 13.1 oz (60.7 kg)    PHYSICAL EXAM  General: Pleasant, chronically ill appearing, NAD. Neuro: Alert and oriented X 3. Moves all extremities spontaneously. Psych: blunted affect. HEENT:  Normal  Neck: Supple without bruits or JVD. Lungs:  Resp regular and unlabored, CTA. Heart: IRegular  tachy, no s3, s4, or murmurs. Abdomen: Soft, non-tender, non-distended, BS + x 4.  Extremities: No clubbing, cyanosis or edema. DP/PT/Radials 2+ and equal bilaterally.  Accessory Clinical Findings  CBC  Recent Labs  09/15/16 0446 09/16/16 0254  WBC 5.7 8.2  HGB 12.5 12.3  HCT 42.6 40.8  MCV 94.9 92.5  PLT 161 0000000   Basic Metabolic Panel  Recent Labs  09/15/16 0446 09/16/16 0254 09/17/16 0209  NA 141 141 139  K 5.3* 4.8 4.3  CL 100* 100* 97*  CO2 32 35* 34*  GLUCOSE 128* 129* 112*  BUN 43* 37* 32*  CREATININE 0.91 0.90 0.88  CALCIUM 9.3 9.1 8.7*  MG 2.5* 2.3 2.3  PHOS 3.6 2.4*  --    Liver Function Tests No results for input(s): AST, ALT, ALKPHOS, BILITOT, PROT, ALBUMIN in the last 72 hours. No results for input(s): LIPASE, AMYLASE in the last 72 hours. Cardiac Enzymes No results for input(s): CKTOTAL, CKMB, CKMBINDEX, TROPONINI in the last 72 hours. BNP Invalid input(s): POCBNP  D-Dimer No results for input(s): DDIMER in the last 72 hours. Hemoglobin A1C No results for input(s): HGBA1C in the last 72 hours. Fasting Lipid Panel No results for input(s): CHOL, HDL, LDLCALC, TRIG, CHOLHDL, LDLDIRECT in the last 72 hours. Thyroid Function Tests No results for input(s): TSH, T4TOTAL, T3FREE, THYROIDAB in the last 72 hours.  Invalid input(s): FREET3  TELE  Atrial fib with RVR, with post termination pauses  Radiology/Studies  Dg Chest 2 View  Result Date: 09/09/2016 CLINICAL DATA:  Cough for 1 week, shortness of Breath EXAM: CHEST  2 VIEW COMPARISON:  01/31/2016 FINDINGS: There is hyperinflation of the lungs compatible with COPD. Scarring in the lingula and right upper lobe. Heart is upper limits normal in size. No acute airspace opacities or effusions. No acute bony abnormality. IMPRESSION: COPD/chronic changes.  No active disease. Electronically Signed   By: Rolm Baptise M.D.   On: 09/09/2016 09:29   Dg Chest Port 1 View  Result Date: 09/16/2016 CLINICAL  DATA:  Shortness of breath, respiratory failure, hypoxia and COPD exacerbation. Atrial fibrillation with rapid ventricular response. EXAM: PORTABLE CHEST 1 VIEW COMPARISON:  09/15/2016 FINDINGS: The heart size and mediastinal contours are within normal limits. Stable emphysematous lung disease. There is no evidence of pulmonary edema, consolidation, pneumothorax, nodule or pleural fluid. The visualized skeletal structures are unremarkable. IMPRESSION: Stable emphysema. Electronically Signed   By: Aletta Edouard M.D.   On: 09/16/2016 14:11   Dg Chest Port 1 View  Result Date: 09/15/2016 CLINICAL DATA:  Respiratory failure. EXAM: PORTABLE CHEST 1 VIEW COMPARISON:  09/12/2016.  01/31/2016. FINDINGS: Mediastinum hilar structures normal. Heart size normal. Bilateral pleural-parenchymal thickening with scarring again noted. COPD. No pneumothorax. IMPRESSION: Bilateral pleural-parenchymal thickening again noted without interim change. Findings consistent with scarring. COPD . Electronically Signed   By: Marcello Moores  Register   On: 09/15/2016 06:44   Dg Chest Port 1 View  Result Date: 09/12/2016 CLINICAL DATA:  Shortness of breath. EXAM: PORTABLE CHEST 1 VIEW COMPARISON:  Radiograph of same day. FINDINGS: Stable cardiomediastinal silhouette is noted. No pneumothorax or pleural effusion is noted. No acute pulmonary disease is noted. Stable bilateral lung scarring is noted. No acute pulmonary disease is noted. Bony thorax is unremarkable. IMPRESSION: No acute cardiopulmonary abnormality seen. Electronically Signed   By: Marijo Conception, M.D.   On: 09/12/2016 19:45   Dg Chest Port 1 View  Result Date: 09/12/2016 CLINICAL DATA:  Shortness of breath, abnormal lung sounds at the bases bilaterally. History of COPD, atrial flutter, CHF. EXAM: PORTABLE CHEST 1 VIEW COMPARISON:  PA and lateral chest x-ray of September 09, 2016 FINDINGS: The lungs remain mildly hyperinflated. The interstitial markings remain coarse especially in  the right upper lobe. There is no alveolar infiltrate or pleural effusion. The cardiac silhouette is top-normal in size. The pulmonary vascularity is normal. There is calcification in the wall of the aortic arch. The trachea is midline. The bony thorax exhibits no acute abnormality. There are chronic changes of both shoulders. IMPRESSION: COPD.  Mild cardiomegaly.  No pneumonia nor pulmonary edema. Thoracic aortic atherosclerosis. Electronically Signed   By: David  Martinique M.D.   On: 09/12/2016 08:35    ASSESSMENT AND PLAN  1. Atrial fib with a RVR - her rates are improved on IV cardizem. Will continue this today.  2. Sinus node dysfunction - she has had some significant post termination pauses. I suspect she will require a PPM because her rates in atrial fib have been very difficult to control.  3. COPD exacerbation - she appears improved. Her lung exam without significant wheezes.  Carleene Overlie Tavian Callander,M.D.  09/17/2016 10:07 AMPatient ID: Claudia [name], female   DOB: 1933/05/21, 83 y.o.   MRN: [id number]

## 2016-09-18 ENCOUNTER — Inpatient Hospital Stay (HOSPITAL_COMMUNITY): Payer: Medicare Other

## 2016-09-18 ENCOUNTER — Encounter (HOSPITAL_COMMUNITY)
Admission: EM | Disposition: A | Discharge status: Home Health | Payer: Self-pay | Source: Home / Self Care | Attending: Internal Medicine

## 2016-09-18 DIAGNOSIS — J9601 Acute respiratory failure with hypoxia: Secondary | ICD-10-CM | POA: Diagnosis present

## 2016-09-18 DIAGNOSIS — I495 Sick sinus syndrome: Secondary | ICD-10-CM

## 2016-09-18 DIAGNOSIS — R0602 Shortness of breath: Secondary | ICD-10-CM

## 2016-09-18 DIAGNOSIS — R0902 Hypoxemia: Secondary | ICD-10-CM

## 2016-09-18 HISTORY — PX: PACEMAKER IMPLANT: EP1218

## 2016-09-18 LAB — CBC
HEMATOCRIT: 46.5 % — AB (ref 36.0–46.0)
HEMOGLOBIN: 15.1 g/dL — AB (ref 12.0–15.0)
MCH: 28.2 pg (ref 26.0–34.0)
MCHC: 32.5 g/dL (ref 30.0–36.0)
MCV: 86.8 fL (ref 78.0–100.0)
Platelets: 192 10*3/uL (ref 150–400)
RBC: 5.36 MIL/uL — ABNORMAL HIGH (ref 3.87–5.11)
RDW: 13.3 % (ref 11.5–15.5)
WBC: 12.4 10*3/uL — AB (ref 4.0–10.5)

## 2016-09-18 LAB — MAGNESIUM: Magnesium: 2.1 mg/dL (ref 1.7–2.4)

## 2016-09-18 LAB — COMPREHENSIVE METABOLIC PANEL
ALBUMIN: 3.1 g/dL — AB (ref 3.5–5.0)
ALK PHOS: 69 U/L (ref 38–126)
ALT: 18 U/L (ref 14–54)
AST: 20 U/L (ref 15–41)
Anion gap: 8 (ref 5–15)
BILIRUBIN TOTAL: 1.1 mg/dL (ref 0.3–1.2)
BUN: 30 mg/dL — AB (ref 6–20)
CO2: 35 mmol/L — AB (ref 22–32)
Calcium: 8.8 mg/dL — ABNORMAL LOW (ref 8.9–10.3)
Chloride: 95 mmol/L — ABNORMAL LOW (ref 101–111)
Creatinine, Ser: 0.92 mg/dL (ref 0.44–1.00)
GFR calc Af Amer: >60 mL/min (ref >60)
GFR calc non Af Amer: 57 mL/min — ABNORMAL LOW (ref >60)
GLUCOSE: 119 mg/dL — AB (ref 65–99)
POTASSIUM: 4.5 mmol/L (ref 3.5–5.1)
Sodium: 138 mmol/L (ref 135–145)
TOTAL PROTEIN: 4.8 g/dL — AB (ref 6.5–8.1)

## 2016-09-18 LAB — PROTIME-INR
INR: 2.44
Prothrombin Time: 27 seconds — ABNORMAL HIGH (ref 11.4–15.2)

## 2016-09-18 LAB — SURGICAL PCR SCREEN
MRSA, PCR: NEGATIVE
Staphylococcus aureus: NEGATIVE

## 2016-09-18 LAB — TYPE AND SCREEN
ABO/RH(D): A POS
Antibody Screen: NEGATIVE

## 2016-09-18 LAB — GLUCOSE, CAPILLARY: Glucose-Capillary: 123 mg/dL — ABNORMAL HIGH (ref 65–99)

## 2016-09-18 LAB — ABO/RH: ABO/RH(D): A POS

## 2016-09-18 SURGERY — PACEMAKER IMPLANT

## 2016-09-18 MED ORDER — LIDOCAINE HCL (PF) 1 % IJ SOLN
INTRAMUSCULAR | Status: AC
  Filled 2016-09-18: qty 30

## 2016-09-18 MED ORDER — SODIUM CHLORIDE 0.9 % IV SOLN
INTRAVENOUS | Status: DC
  Administered 2016-09-18: 12:00:00 via INTRAVENOUS

## 2016-09-18 MED ORDER — VANCOMYCIN HCL IN DEXTROSE 1-5 GM/200ML-% IV SOLN
INTRAVENOUS | Status: AC
  Filled 2016-09-18: qty 200

## 2016-09-18 MED ORDER — FENTANYL CITRATE (PF) 100 MCG/2ML IJ SOLN
INTRAMUSCULAR | Status: AC
  Filled 2016-09-18: qty 2

## 2016-09-18 MED ORDER — IPRATROPIUM-ALBUTEROL 0.5-2.5 (3) MG/3ML IN SOLN
RESPIRATORY_TRACT | Status: AC
  Filled 2016-09-18: qty 3

## 2016-09-18 MED ORDER — SODIUM CHLORIDE 0.9 % IV SOLN: INTRAVENOUS | Status: DC

## 2016-09-18 MED ORDER — MIDAZOLAM HCL 5 MG/5ML IJ SOLN
INTRAMUSCULAR | Status: AC
  Filled 2016-09-18: qty 5

## 2016-09-18 MED ORDER — LIDOCAINE HCL (PF) 1 % IJ SOLN
INTRAMUSCULAR | Status: DC | PRN
  Administered 2016-09-18: 15 mL
  Administered 2016-09-18: 35 mL via INTRADERMAL

## 2016-09-18 MED ORDER — SODIUM CHLORIDE 0.9 % IV SOLN: Freq: Once | INTRAVENOUS | Status: DC

## 2016-09-18 MED ORDER — IPRATROPIUM-ALBUTEROL 0.5-2.5 (3) MG/3ML IN SOLN
3.0000 mL | Freq: Four times a day (QID) | RESPIRATORY_TRACT | Status: DC
  Administered 2016-09-18 – 2016-09-22 (×16): 3 mL via RESPIRATORY_TRACT
  Filled 2016-09-18 (×20): qty 3

## 2016-09-18 MED ORDER — SODIUM CHLORIDE 0.9 % IR SOLN
80.0000 mg | Status: AC
  Administered 2016-09-18: 80 mg

## 2016-09-18 MED ORDER — VANCOMYCIN HCL IN DEXTROSE 1-5 GM/200ML-% IV SOLN
1000.0000 mg | Freq: Two times a day (BID) | INTRAVENOUS | Status: AC
  Administered 2016-09-18: 1000 mg via INTRAVENOUS
  Filled 2016-09-18 (×2): qty 200

## 2016-09-18 MED ORDER — CHLORHEXIDINE GLUCONATE 4 % EX LIQD: 60.0000 mL | Freq: Once | CUTANEOUS | Status: DC

## 2016-09-18 MED ORDER — FENTANYL CITRATE (PF) 100 MCG/2ML IJ SOLN
25.0000 ug | Freq: Once | INTRAMUSCULAR | Status: DC
  Administered 2016-09-18: 25 ug via INTRAVENOUS

## 2016-09-18 MED ORDER — CHLORHEXIDINE GLUCONATE 4 % EX LIQD
60.0000 mL | Freq: Once | CUTANEOUS | Status: AC
  Administered 2016-09-18: 4 via TOPICAL

## 2016-09-18 MED ORDER — HEPARIN (PORCINE) IN NACL 2-0.9 UNIT/ML-% IJ SOLN
INTRAMUSCULAR | Status: AC
  Filled 2016-09-18: qty 500

## 2016-09-18 MED ORDER — CHLORHEXIDINE GLUCONATE 4 % EX LIQD
CUTANEOUS | Status: AC
  Administered 2016-09-18: 4 via TOPICAL
  Filled 2016-09-18: qty 15

## 2016-09-18 MED ORDER — SODIUM CHLORIDE 0.9 % IR SOLN
Status: AC
  Filled 2016-09-18: qty 2

## 2016-09-18 MED ORDER — HEPARIN (PORCINE) IN NACL 2-0.9 UNIT/ML-% IJ SOLN
INTRAMUSCULAR | Status: DC | PRN
  Administered 2016-09-18: 13:00:00

## 2016-09-18 MED ORDER — DILTIAZEM HCL ER COATED BEADS 240 MG PO CP24
240.0000 mg | ORAL_CAPSULE | Freq: Every day | ORAL | Status: DC
  Administered 2016-09-18: 240 mg via ORAL
  Filled 2016-09-18: qty 1

## 2016-09-18 MED ORDER — VANCOMYCIN HCL IN DEXTROSE 1-5 GM/200ML-% IV SOLN
1000.0000 mg | INTRAVENOUS | Status: DC
  Administered 2016-09-18: 1000 mg via INTRAVENOUS

## 2016-09-18 SURGICAL SUPPLY — 7 items
CABLE SURGICAL S-101-97-12 (CABLE) ×3 IMPLANT
LEAD TENDRIL MRI 46CM LPA1200M (Lead) ×3 IMPLANT
LEAD TENDRIL MRI 52CM LPA1200M (Lead) ×3 IMPLANT
PACEMAKER ASSURITY DR-RF (Pacemaker) ×3 IMPLANT
PAD DEFIB LIFELINK (PAD) ×3 IMPLANT
SHEATH CLASSIC 8F (SHEATH) ×6 IMPLANT
TRAY PACEMAKER INSERTION (PACKS) ×3 IMPLANT

## 2016-09-18 NOTE — Progress Notes (Signed)
Patient post PPM implant in acute respiratory distress. Respiratory Therapy here giving treatment. STAT PCXR done. Dr. Curt Bears at bedside. Dr. Titus Mould here to place left chest tube.

## 2016-09-18 NOTE — Progress Notes (Signed)
Orthopedic Tech Progress Note Patient Details:  Claudia [name] [facility] 1933/02/28 [id number]  Ortho Devices Ortho Device/Splint Location: Pt Already has Arm Sling at this time. (Ortho Tech)   Kristopher Oppenheim 09/18/2016, 4:05 PM

## 2016-09-18 NOTE — Progress Notes (Signed)
PULMONARY / CRITICAL CARE MEDICINE   Name: Claudia [name] MRN: [id number] DOB: [birth year]    ADMISSION DATE:  09/09/2016 CONSULTATION DATE:  09/13/16  REFERRING MD:  Ree Kida, MD  CHIEF COMPLAINT:  Shortness of breath  BRIEF SUMMARY:  83yo F admitted 1/27 with dyspnea, cough and hypoxia thought related to AECOPD.  Found to be in AF with RVR 1/28 (responded well to sotalol / metoprolol).  Cardiology following for concerns of tachy-brady syndrome.  She had an episode of respiratory distress 1/31 and was transferred to ICU for bipap.  Course complicated by AF with RVR and episodes of bradycardia.    PMHx of paroxysmal AFib on warfarin, COPD, chronic diastolic HF, and HTN    SUBJECTIVE:  Pt reports feeling "jittery" and mild SOB.  HR range 1020-180 while at bedside. Denies chest pain, fevers, chills, n/v/d.    VITAL SIGNS: BP (!) 167/85 (BP Location: Right Arm)   Pulse (!) 142   Temp 97.3 F (36.3 C) (Oral)   Resp (!) 22   Ht 5\' 2"  (1.575 m)   Wt 131 lb 6.3 oz (59.6 kg)   SpO2 96%   BMI 24.03 kg/m  2 liters   HEMODYNAMICS:    VENTILATOR SETTINGS:    INTAKE / OUTPUT: I/O last 3 completed shifts: In: 975.2 [P.O.:480; I.V.:495.2] Out: 1600 [Urine:1600]  General: elderly female in NAD lying in bed HEENT: MM pink/moist PSY: anxious Neuro: AAOx4, speech clear, MAE, generalized weakness  CV: s1s2 irr irr, no m/r/g, AF w RVR on monitor PULM: even/non-labored, lungs bilaterally with few soft wheezes  [id number], non-tender, bsx4 active  Extremities: warm/dry, trace LE edema  Skin: no rashes or lesions    LABS:  BMET  Recent Labs Lab 09/16/16 0254 09/17/16 0209 09/18/16 0210  NA 141 139 138  K 4.8 4.3 4.5  CL 100* 97* 95*  CO2 35* 34* 35*  BUN 37* 32* 30*  CREATININE 0.90 0.88 0.92  GLUCOSE 129* 112* 119*   Electrolytes  Recent Labs Lab 09/14/16 0231 09/15/16 0446 09/16/16 0254 09/17/16 0209 09/18/16 0210  CALCIUM 9.5 9.3 9.1 8.7* 8.8*  MG 2.5* 2.5* 2.3  2.3 2.1  PHOS 3.8 3.6 2.4*  --   --    CBC  Recent Labs Lab 09/15/16 0446 09/16/16 0254 09/18/16 0210  WBC 5.7 8.2 12.4*  HGB 12.5 12.3 15.1*  HCT 42.6 40.8 46.5*  PLT 161 153 192   Coag's  Recent Labs Lab 09/16/16 0254 09/17/16 0209 09/18/16 0210  INR 5.32* 4.86* 2.44   Sepsis Markers No results for input(s): LATICACIDVEN, PROCALCITON, O2SATVEN in the last 168 hours.  ABG  Recent Labs Lab 09/12/16 1740 09/12/16 2105 09/13/16 0825  PHART 7.235* 7.285* 7.325*  PCO2ART 90.1* 76.0* 71.0*  PO2ART 105 84.6 127*   Liver Enzymes  Recent Labs Lab 09/18/16 0210  AST 20  ALT 18  ALKPHOS 69  BILITOT 1.1  ALBUMIN 3.1*   Cardiac Enzymes No results for input(s): TROPONINI, PROBNP in the last 168 hours.   Glucose  Recent Labs Lab 09/12/16 0555 09/14/16 0814 09/15/16 0825 09/16/16 0818 09/17/16 0753 09/18/16 0826  GLUCAP 130* 149* 126* 126* 101* 123*   Imaging No new films 2/5.  Last imaging 2/3.    STUDIES:  CXR 1/30 >> COPD changes, no acute abnormalities   CULTURES: RVP 1/28 >> negative  ANTIBIOTICS: None  SIGNIFICANT EVENTS: 1/27  Admitted with dyspnea and hypoxic respiratory failure 1/28  AFib with RVR, responded to metoprolol, sotalol 1/30  Bradycardia in 30s, then AFib with RVR in 150s 1/31  Transferred to ICU due to respiratory distress, tachy-brady 2/03  Started on CCB gtt 2/04  Rate controlled.   LINES/TUBES: PIV 1/27   ASSESSMENT / PLAN:  Acute hypoxic and hypercarbic respiratory failure in the setting of AECOPD, further exacerbated by AF w/ RVR Plan Continue O2, wean for sats 90-95% Pulmonary hygiene - IS, mobilize as able  Treat underlying tachy-brady syndrome  Continue Xopenex + Atrovent TID  Wean prednisone, 30 mg QD  Af w/ RVR and SSS Hx diastolic HF Plan Cardiology following  Tele monitoring  Likely plan for pacemaker placement (2/5 ?) then medical mgmt of tachycardia  Continue digoxin, tikosyn and  cardizem Coumadin per pharmacy   Metabolic alkalosis in setting of chronic hypercarbia and probably exacerbated by diuretics  Plan Trend BMP  Replace electrolytes as indicated   Delirium  Plan  Wean steroids to off as able  Supportive care Promote sleep / wake cycle   FAMILY  - Updates:  Patient and daughter updated at bedside 2/5.      Noe Gens, NP-C [facility] Pgr: [phone] or if no answer [phone] 09/18/2016, 9:03 AM

## 2016-09-18 NOTE — Progress Notes (Signed)
Claudia [name] presented to the EP lab with a history of tachy brady syndrome. During the procedure, she continuously became more short of breath. When coming off the table, her O2 sats dropped to 84. She was moved into the post procedure holding area and a chest x-ray was perfromed. On CXR, a pneumothorax was noted with tension features. Critical care medicine was called who placed a chest tube. Post chest tube, the patient's breathing improved. Post procedure chest xray showed resolution of the pneumothorax.  Abayomi Pattison Curt Bears, MD 09/18/2016 3:38 PM

## 2016-09-18 NOTE — Progress Notes (Signed)
SUBJECTIVE: The patient denies chest pain, no active shortness of breath on n/c O2, she is "a little nervous" about the idea of a pacemaker.  Marland Kitchen aspirin EC  81 mg Oral Daily  . digoxin  0.25 mg Intravenous Once  . dofetilide  250 mcg Oral BID  . feeding supplement (ENSURE ENLIVE)  237 mL Oral BID BM  . ipratropium  0.5 mg Nebulization TID  . levalbuterol  0.63 mg Nebulization TID  . mouth rinse  15 mL Mouth Rinse BID  . pantoprazole  40 mg Oral Daily  . polyvinyl alcohol  1 drop Both Eyes TID  . predniSONE  30 mg Oral Q breakfast  . sodium chloride flush  3 mL Intravenous Q12H  . sodium chloride flush  3 mL Intravenous Q12H  . sodium chloride flush  3 mL Intravenous Q12H  . Warfarin - Pharmacist Dosing Inpatient   Does not apply q1800   . diltiazem (CARDIZEM) infusion 15 mg/hr (09/18/16 0700)    OBJECTIVE: Physical Exam: Vitals:   09/18/16 0500 09/18/16 0600 09/18/16 0829 09/18/16 0839  BP: (!) 153/98 (!) 169/107 (!) 167/85   Pulse: (!) 150 (!) 103 (!) 142   Resp: 18 19 (!) 22   Temp:   97.3 F (36.3 C)   TempSrc:   Oral   SpO2: 93% 95% 92% 96%  Weight:      Height:        Intake/Output Summary (Last 24 hours) at 09/18/16 0846 Last data filed at 09/18/16 0700  Gross per 24 hour  Intake           787.71 ml  Output              850 ml  Net           -62.29 ml    Telemetry is reviewed by myself, AFib 130's-150's, bursts as high 170's, she also has had occasional very brief periods of SR, noted both very short post termination pauses, and as long as 3.5 seconds pauses has been noted  GEN- The patient is well appearing, alert and oriented x 3 today.   Head- normocephalic, atraumatic Eyes-  Sclera clear, conjunctiva pink Ears- hearing intact Oropharynx- clear Neck- supple, no JVP Lungs- CTA, no wheezes, normal work of breathing Heart- tachycardic, irregular, no significant murmurs, no rubs or gallops GI- soft, NT, ND Extremities- no clubbing, cyanosis, or  edema Skin- no rash or lesion Psych- euthymic mood, full affect Neuro- no gross deficits appreciated  LABS: Basic Metabolic Panel:  Recent Labs  09/16/16 0254 09/17/16 0209 09/18/16 0210  NA 141 139 138  K 4.8 4.3 4.5  CL 100* 97* 95*  CO2 35* 34* 35*  GLUCOSE 129* 112* 119*  BUN 37* 32* 30*  CREATININE 0.90 0.88 0.92  CALCIUM 9.1 8.7* 8.8*  MG 2.3 2.3 2.1  PHOS 2.4*  --   --    Liver Function Tests:  Recent Labs  09/18/16 0210  AST 20  ALT 18  ALKPHOS 69  BILITOT 1.1  PROT 4.8*  ALBUMIN 3.1*   CBC:  Recent Labs  09/16/16 0254 09/18/16 0210  WBC 8.2 12.4*  HGB 12.3 15.1*  HCT 40.8 46.5*  MCV 92.5 86.8  PLT 153 192    RADIOLOGY: Dg Chest Port 1 View Result Date: 09/16/2016 CLINICAL DATA:  Shortness of breath, respiratory failure, hypoxia and COPD exacerbation. Atrial fibrillation with rapid ventricular response. EXAM: PORTABLE CHEST 1 VIEW COMPARISON:  09/15/2016 FINDINGS: The heart  size and mediastinal contours are within normal limits. Stable emphysematous lung disease. There is no evidence of pulmonary edema, consolidation, pneumothorax, nodule or pleural fluid. The visualized skeletal structures are unremarkable. IMPRESSION: Stable emphysema. Electronically Signed   By: Aletta Edouard M.D.   On: 09/16/2016 14:11     ASSESSMENT AND PLAN:  Bradycardic rates observed blocked PACs, home sotalol stopped yesterday RAFib again late yesterday, no recurrent brady rates Echo EF 60-65%, normal wall motion, trivial pericardial effusion, grade II DD INR today is 2.44, has mantained a therapeutic or supra-therapeutic level since here   Continue to manage respiratory issues with CCM/pulmonary team     K+ 4.5     Mag 2.1     Creat 0.92     Tikosyn load completed, also on dilt gtt, rates continue to be uncontrolled, despite improved respiratory status     Post termination pauses noted, was discussed yesterday possible need for pacer     Patient is  agreeable to idea of pacemaker, discussed with her yesterday by Dr. Lovena Le       2. Respiratory failure Acute on chronic, COPD exacerbation IM noted, not felt to have infectious component Getting steroids, nebs, PRN BIPAP, on n/c O2 this AM     Patient's daughter was here all day, and reviewed with RN, did not require BIPAP yesterday  3. Diastolic CHF mentioned Not felt to be actively overloaded  4. HTN continue meds, PRN hydralazine   5. N/V Not an ongoing complaint   [name], PA-C 09/18/2016 8:46 AM  I have seen and examined this patient with [name].  Agree with above, note added to reflect my findings.  On exam, tachycardic, irregular, no murmurs, lungs clear.  Continued atrial fibrillation with RVR and pauses when she converts to sinus rhythm. Likely with tachy-brady syndrome. Due to that, Dot Splinter plan for pacemaker placement. Her respiratory status has seemingly improved and she has not required bipap in the last 24 hours. Risks and benefits discussed. Risks include but not limited to bleeding, infection, tamponade, pneumothorax. The patient understands these risks and has agreed to the procedure. Bonnita Newby likely require more aggressive rate control after the pacemaker is in place. May also require cardioversion as her lung issues are resolving and she may remain in sinus rhythm.  Prima Rayner M. Edyn Popoca MD 09/18/2016 1:01 PM

## 2016-09-18 NOTE — Procedures (Signed)
Chest Tube Insertion Procedure Note Post pacer, looking like near arrest PTX  Indications:  Clinically significant Pneumothorax  Pre-operative Diagnosis: Pneumothorax  LEFT  Post-operative Diagnosis: Pneumothorax  Procedure Details  Informed consent was obtained for the procedure, including sedation.  Risks of lung perforation, hemorrhage, arrhythmia, and adverse drug reaction were discussed.   After sterile skin prep, using standard technique, a 20 French tube was placed in the left lateral 5th rib space.  Findings: Big gush air  Estimated Blood Loss:  Minimal         Specimens:  None              Complications:  None; patient tolerated the procedure well.         Disposition: PACU - hemodynamically stable.         Condition: stable  Attending Attestation: I performed the procedure.  Initially ordered ffp but had min loss and nothing from chest out Dc ffp pcxr stat resolving ptx   Lavon Paganini. Titus Mould, MD, [place] Pgr: [facility]

## 2016-09-18 NOTE — Progress Notes (Signed)
PCXR post chest tube insertion done. BP stable.Patient awake, states breathing is much better. Chest tube to pleurovac

## 2016-09-18 NOTE — Progress Notes (Signed)
Neb treatment stopped, NRB masl placed on pt due to desat. CCM came in to place a chest tube.

## 2016-09-19 ENCOUNTER — Encounter (HOSPITAL_COMMUNITY): Payer: Self-pay | Admitting: Cardiology

## 2016-09-19 ENCOUNTER — Inpatient Hospital Stay (HOSPITAL_COMMUNITY): Payer: Medicare Other

## 2016-09-19 DIAGNOSIS — R410 Disorientation, unspecified: Secondary | ICD-10-CM

## 2016-09-19 DIAGNOSIS — S270XXA Traumatic pneumothorax, initial encounter: Secondary | ICD-10-CM | POA: Diagnosis present

## 2016-09-19 LAB — CBC
HCT: 41.8 % (ref 36.0–46.0)
Hemoglobin: 13.2 g/dL (ref 12.0–15.0)
MCH: 27.7 pg (ref 26.0–34.0)
MCHC: 31.6 g/dL (ref 30.0–36.0)
MCV: 87.6 fL (ref 78.0–100.0)
Platelets: 202 10*3/uL (ref 150–400)
RBC: 4.77 MIL/uL (ref 3.87–5.11)
RDW: 14.2 % (ref 11.5–15.5)
WBC: 18.3 10*3/uL — ABNORMAL HIGH (ref 4.0–10.5)

## 2016-09-19 LAB — PROTIME-INR
INR: 1.6
PROTHROMBIN TIME: 19.2 s — AB (ref 11.4–15.2)

## 2016-09-19 LAB — BASIC METABOLIC PANEL
Anion gap: 14 (ref 5–15)
BUN: 34 mg/dL — AB (ref 6–20)
CALCIUM: 9 mg/dL (ref 8.9–10.3)
CO2: 30 mmol/L (ref 22–32)
CREATININE: 1.42 mg/dL — AB (ref 0.44–1.00)
Chloride: 90 mmol/L — ABNORMAL LOW (ref 101–111)
GFR calc non Af Amer: 34 mL/min — ABNORMAL LOW (ref >60)
GFR, EST AFRICAN AMERICAN: 39 mL/min — AB (ref >60)
Glucose, Bld: 113 mg/dL — ABNORMAL HIGH (ref 65–99)
Potassium: 4.4 mmol/L (ref 3.5–5.1)
SODIUM: 134 mmol/L — AB (ref 135–145)

## 2016-09-19 LAB — GLUCOSE, CAPILLARY: GLUCOSE-CAPILLARY: 103 mg/dL — AB (ref 65–99)

## 2016-09-19 LAB — PREPARE FRESH FROZEN PLASMA
UNIT DIVISION: 0
Unit division: 0

## 2016-09-19 MED ORDER — METOPROLOL TARTRATE 25 MG PO TABS
25.0000 mg | ORAL_TABLET | Freq: Four times a day (QID) | ORAL | Status: DC
  Administered 2016-09-19 – 2016-09-20 (×4): 25 mg via ORAL
  Filled 2016-09-19 (×4): qty 1

## 2016-09-19 MED ORDER — WARFARIN SODIUM 3 MG PO TABS
3.0000 mg | ORAL_TABLET | Freq: Once | ORAL | Status: AC
  Administered 2016-09-19: 3 mg via ORAL
  Filled 2016-09-19: qty 1.5
  Filled 2016-09-19: qty 1

## 2016-09-19 MED ORDER — DILTIAZEM HCL ER COATED BEADS 180 MG PO CP24
360.0000 mg | ORAL_CAPSULE | Freq: Every day | ORAL | Status: DC
  Administered 2016-09-19 – 2016-09-24 (×6): 360 mg via ORAL
  Filled 2016-09-19: qty 2
  Filled 2016-09-19 (×5): qty 1

## 2016-09-19 MED FILL — Midazolam HCl Inj 5 MG/5ML (Base Equivalent): INTRAMUSCULAR | Qty: 5 | Status: AC

## 2016-09-19 MED FILL — Fentanyl Citrate Preservative Free (PF) Inj 100 MCG/2ML: INTRAMUSCULAR | Qty: 2 | Status: AC

## 2016-09-19 NOTE — Progress Notes (Signed)
ANTICOAGULATION CONSULT NOTE - Follow Up Consult  Pharmacy Consult for warfarin Indication: atrial fibrillation  Allergies  Allergen Reactions  . Augmentin [Amoxicillin-Pot Clavulanate] Shortness Of Breath  . Doxycycline Shortness Of Breath  . Nitrofurantoin Shortness Of Breath  . Amiodarone Hcl Nausea And Vomiting  . Clarithromycin Other (See Comments)    Crazy feeling, confusion  . Clonazepam Other (See Comments)    Doesn't remember reaction  . Codeine Nausea And Vomiting  . Diltiazem Hcl Nausea And Vomiting  . Fish Oil Nausea And Vomiting  . Flecainide Nausea Only  . Multaq [Dronedarone] Nausea Only  . Rosuvastatin Other (See Comments)    Leg cramps   . Zetia [Ezetimibe] Other (See Comments)    Leg cramps    Patient Measurements: Height: 5\' 2"  (157.5 cm) Weight: 136 lb 11 oz (62 kg) IBW/kg (Calculated) : 50.1  Vital Signs: Temp: 97.8 F (36.6 C) (02/06 0824) Temp Source: Oral (02/06 0824) BP: 102/51 (02/06 1000) Pulse Rate: 67 (02/06 1015)  Labs:  Recent Labs  09/17/16 0209 09/18/16 0210 09/19/16 0915  HGB  --  15.1* 13.2  HCT  --  46.5* 41.8  PLT  --  192 202  LABPROT 46.7* 27.0* 19.2*  INR 4.86* 2.44 1.60  CREATININE 0.88 0.92 1.42*    Estimated Creatinine Clearance: 26.9 mL/min (by C-G formula based on SCr of 1.42 mg/dL (H)).  Assessment: 8 YOF admitted for SOB on PTA warfarin for Afib. Pharmacy consulted to dose warfarin. INR 4.86 > goal 2-3 need to keep > 2 after tikosyn load.  May be elevated due to interacting w/ Levaquin which is now discontinued. No bleeding per RN.  Tikosyn started 2/1 Afib> SR 2/2  PTA warfarin dose is 2mg  MWF, 1mg  all other days (dose very stable as outpatient per clinic records)  Warfarin dose was missed during peri-procedural period yesterday, INR down to 1.6 today.  Discussed with [name], PA-C, no plans for bridging right now, given pacer implantation yesterday.  Goal of Therapy:  INR 2-3 Monitor platelets by  anticoagulation protocol: Yes   Plan:  Warfarin 3 mg x 1 now. Daily INR Monitor s/sx bleeding  Uvaldo Rising, BCPS  Clinical Pharmacist Pager [phone]  09/19/2016 11:11 AM

## 2016-09-19 NOTE — Care Management Note (Addendum)
Case Management Note  Patient Details  Name: RUPAL WETHERELL MRN: [id number] Date of Birth: 10/25/1932  Subjective/Objective:   Adm w copd, arrthymia                 Action/Plan: lives w fam, [place]  Expected Discharge Date:                  Expected Discharge Plan:  Home/Self Care  In-House Referral:     Discharge planning Services  CM Consult  Post Acute Care Choice:  Durable Medical Equipment Choice offered to:     DME Arranged:  [facility]/meds DME Agency:  [facility]  HH Arranged:    [place] Agency:     Status of Service:  In process, will continue to follow  If discussed at Long Length of Stay Meetings, dates discussed:    Additional Comments:pt on tikosyn. thas 45% copay and needs prior auth. Placed pt assist form on shadow chart. Have spoken w pt and da to alert them of this also. Da will ck w pt's drugstore about exact copay.  Lacretia Leigh, RN 09/19/2016, 10:50 AM

## 2016-09-19 NOTE — Progress Notes (Signed)
Orthopedic Tech Progress Note Patient Details:  Claudia [name] 01/29/1933 [id number]  Ortho Devices Type of Ortho Device: Arm sling Ortho Device/Splint Location: replacement Ortho Device/Splint Interventions: Application   [place] Pink 09/19/2016, 5:19 PM

## 2016-09-19 NOTE — Progress Notes (Signed)
PULMONARY / CRITICAL CARE MEDICINE   Name: Claudia [name] MRN: [id number] DOB: 1932-10-18    ADMISSION DATE:  09/09/2016 CONSULTATION DATE:  09/13/16  REFERRING MD:  Ree Kida, MD  CHIEF COMPLAINT:  Shortness of breath  BRIEF SUMMARY:  83yo F admitted 1/27 with dyspnea, cough and hypoxia thought related to AECOPD.  Found to be in AF with RVR 1/28 (responded well to sotalol / metoprolol).  Cardiology following for concerns of tachy-brady syndrome.  She had an episode of respiratory distress 1/31 and was transferred to ICU for bipap.  Course complicated by AF with RVR and episodes of bradycardia.    PMHx of paroxysmal AFib on warfarin, COPD, chronic diastolic HF, and HTN  2/5 left pnx post PPM insertion   SUBJECTIVE:  Co left chest pain VITAL SIGNS: BP (!) 102/51   Pulse 67   Temp 97.8 F (36.6 C) (Oral)   Resp 20   Ht 5\' 2"  (1.575 m)   Wt 136 lb 11 oz (62 kg)   SpO2 99%   BMI 25.00 kg/m  2 liters   HEMODYNAMICS:    VENTILATOR SETTINGS:    INTAKE / OUTPUT: I/O last 3 completed shifts: In: 672.6 [P.O.:290; I.V.:382.6] Out: 1406 [Urine:1400; Chest Tube:6]  General: elderly female in NAD lying in bed HEENT: MM pink/moist PSY: anxious Neuro: AAOx4, speech clear, MAE, generalized weakness  CV: s1s2 irr irr, no m/r/g, sr 78 PULM: even/non-labored, lungs bilaterally with few soft wheezes  Left ct without air leak [id number], non-tender, bsx4 active  Extremities: warm/dry, trace LE edema  Skin: no rashes or lesions    LABS:  BMET  Recent Labs Lab 09/17/16 0209 09/18/16 0210 09/19/16 0915  NA 139 138 134*  K 4.3 4.5 4.4  CL 97* 95* 90*  CO2 34* 35* 30  BUN 32* 30* 34*  CREATININE 0.88 0.92 1.42*  GLUCOSE 112* 119* 113*   Electrolytes  Recent Labs Lab 09/14/16 0231 09/15/16 0446 09/16/16 0254 09/17/16 0209 09/18/16 0210 09/19/16 0915  CALCIUM 9.5 9.3 9.1 8.7* 8.8* 9.0  MG 2.5* 2.5* 2.3 2.3 2.1  --   PHOS 3.8 3.6 2.4*  --   --   --    CBC  Recent  Labs Lab 09/16/16 0254 09/18/16 0210 09/19/16 0915  WBC 8.2 12.4* 18.3*  HGB 12.3 15.1* 13.2  HCT 40.8 46.5* 41.8  PLT 153 192 202   Coag's  Recent Labs Lab 09/17/16 0209 09/18/16 0210 09/19/16 0915  INR 4.86* 2.44 1.60   Sepsis Markers No results for input(s): LATICACIDVEN, PROCALCITON, O2SATVEN in the last 168 hours.  ABG  Recent Labs Lab 09/12/16 1740 09/12/16 2105 09/13/16 0825  PHART 7.235* 7.285* 7.325*  PCO2ART 90.1* 76.0* 71.0*  PO2ART 105 84.6 127*   Liver Enzymes  Recent Labs Lab 09/18/16 0210  AST 20  ALT 18  ALKPHOS 69  BILITOT 1.1  ALBUMIN 3.1*   Cardiac Enzymes No results for input(s): TROPONINI, PROBNP in the last 168 hours.   Glucose  Recent Labs Lab 09/14/16 0814 09/15/16 0825 09/16/16 0818 09/17/16 0753 09/18/16 0826 09/19/16 0825  GLUCAP 149* 126* 126* 101* 123* 103*   Imaging 2/6    STUDIES:  CXR 1/30 >> COPD changes, no acute abnormalities  2/5 cxr with new PPM and lt pnx  CULTURES: RVP 1/28 >> negative  ANTIBIOTICS: None  SIGNIFICANT EVENTS: 1/27  Admitted with dyspnea and hypoxic respiratory failure 1/28  AFib with RVR, responded to metoprolol, sotalol 1/30  Bradycardia in 30s, then AFib  with RVR in 150s 1/31  Transferred to ICU due to respiratory distress, tachy-brady 2/03  Started on CCB gtt 2/04  Rate controlled.  2/5 pnx fromm PPM placed 2/5 2/5   LINES/TUBES: PIV 1/27 2/5 left chest tube>>   ASSESSMENT / PLAN:  Acute hypoxic and hypercarbic respiratory failure in the setting of AECOPD, further exacerbated by AF w/ RVR 2/5 left pnx post PPM insertion Plan Continue O2, wean for sats 90-95% Pulmonary hygiene - IS, mobilize as able  Treat underlying tachy-brady syndrome  Continue Xopenex + Atrovent TID  Wean prednisone, 30 mg QD 2/6 take suction off ct, check cxr in am 2/7  Af w/ RVR and SSS Hx diastolic HF PPM placed 2/5 with pnx Lab Results  Component Value Date   INR 1.60 09/19/2016    INR 2.44 09/18/2016   INR 4.86 (HH) 09/17/2016    Plan Cardiology following , currently sr Tele monitoring   tachycardia  Continue digoxin, tikosyn and cardizem Coumadin per pharmacy   Metabolic alkalosis in setting of chronic hypercarbia and probably exacerbated by diuretics  Plan Trend BMP  Replace electrolytes as indicated   Delirium  Plan  Wean steroids to off as able  Supportive care Promote sleep / wake cycle   FAMILY  - Updates:  Patient and daughter updated at bedside 2/5.  Plan to dc suction and follow c x r .   Richardson Landry Minor ACNP Maryanna Shape PCCM Pager [phone] till 3 pm If no answer page [phone] 09/19/2016, 11:28 AM  Attending Note:  83 year old female with PMH above that is DNR who suffered a PTX after placement of a pacer.  CT in place and on suction.  On exam, BS heard bilaterally.  I reviewed CXR myself, CT in good position and no PTX.  Discussed with PCCM-NP and TRH-MD.  PTX:  - CT to suction  - CXR in AM  Hypoxemia:  - Titrate O2 for sat of 88-92%.  - Will need an ambulatory desaturation study prior to discharge for ?home O2  Disposition:  - Transfer to SDU  - Transfer care to [facility] service  Delirium:  - Wean down steroids.  - Supportive care.  - Promote sleep/wake cycle.  COPD  - BD as ordered  - Ambulate  Patient's daughter updated bedside.  Transfer to SDU and to [facility] service with PCCM following for CT management.  Patient seen and examined, agree with above note.  I dictated the care and orders written for this patient under my direction.  Rush Farmer, MD [phone]

## 2016-09-19 NOTE — Plan of Care (Signed)
Problem: Education: Goal: Knowledge of cardiac device and self-care will improve Outcome: Progressing Patient understands not to utilize left arm until further instructed d/t recent pacemaker.

## 2016-09-19 NOTE — Progress Notes (Signed)
SUBJECTIVE: The patient denies chest pain, no active shortness of breath on n/c O2, she is "a little nervous" about the idea of a pacemaker.  Marland Kitchen aspirin EC  81 mg Oral Daily  . digoxin  0.25 mg Intravenous Once  . diltiazem  360 mg Oral Daily  . dofetilide  250 mcg Oral BID  . feeding supplement (ENSURE ENLIVE)  237 mL Oral BID BM  . ipratropium-albuterol  3 mL Nebulization Q6H  . mouth rinse  15 mL Mouth Rinse BID  . metoprolol tartrate  25 mg Oral Q6H  . pantoprazole  40 mg Oral Daily  . polyvinyl alcohol  1 drop Both Eyes TID  . predniSONE  30 mg Oral Q breakfast  . sodium chloride flush  3 mL Intravenous Q12H  . Warfarin - Pharmacist Dosing Inpatient   Does not apply q1800   . diltiazem (CARDIZEM) infusion Stopped (09/18/16 1845)    OBJECTIVE: Physical Exam: Vitals:   09/19/16 0700 09/19/16 0800 09/19/16 0824 09/19/16 0900  BP: (!) 139/91 138/88  118/72  Pulse: (!) 151 (!) 149  (!) 159  Resp: 20 (!) 23  18  Temp:   97.8 F (36.6 C)   TempSrc:   Oral   SpO2: 96% 96%  96%  Weight:      Height:        Intake/Output Summary (Last 24 hours) at 09/19/16 0944 Last data filed at 09/19/16 0530  Gross per 24 hour  Intake           189.92 ml  Output              556 ml  Net          -366.08 ml    Telemetry is reviewed by myself, AFib 130's-150's, bursts as high 170's, she also has had occasional very brief periods of SR, noted both very short post termination pauses, and as long as 3.5 seconds pauses has been noted  GEN- The patient is well appearing, alert and oriented x 3 today.   Head- normocephalic, atraumatic Eyes-  Sclera clear, conjunctiva pink Ears- hearing intact Oropharynx- clear Neck- supple, no JVP Lungs- CTA, no wheezes, normal work of breathing Heart- tachycardic, irregular, no significant murmurs, no rubs or gallops GI- soft, NT, ND Extremities- no clubbing, cyanosis, or edema Skin- no rash or lesion Psych- euthymic mood, full affect Neuro- no  gross deficits appreciated  LABS: Basic Metabolic Panel:  Recent Labs  09/17/16 0209 09/18/16 0210  NA 139 138  K 4.3 4.5  CL 97* 95*  CO2 34* 35*  GLUCOSE 112* 119*  BUN 32* 30*  CREATININE 0.88 0.92  CALCIUM 8.7* 8.8*  MG 2.3 2.1   Liver Function Tests:  Recent Labs  09/18/16 0210  AST 20  ALT 18  ALKPHOS 69  BILITOT 1.1  PROT 4.8*  ALBUMIN 3.1*   CBC:  Recent Labs  09/18/16 0210  WBC 12.4*  HGB 15.1*  HCT 46.5*  MCV 86.8  PLT 192    RADIOLOGY: Dg Chest Port 1 View Result Date: 09/16/2016 CLINICAL DATA:  Shortness of breath, respiratory failure, hypoxia and COPD exacerbation. Atrial fibrillation with rapid ventricular response. EXAM: PORTABLE CHEST 1 VIEW COMPARISON:  09/15/2016 FINDINGS: The heart size and mediastinal contours are within normal limits. Stable emphysematous lung disease. There is no evidence of pulmonary edema, consolidation, pneumothorax, nodule or pleural fluid. The visualized skeletal structures are unremarkable. IMPRESSION: Stable emphysema. Electronically Signed   By: Claudia [name]  M.D.   On: 09/16/2016 14:11     ASSESSMENT AND PLAN:  Echo EF 60-65%, normal wall motion, trivial pericardial effusion, grade II DD INR today is pending this morning, has mantained a therapeutic or supra-therapeutic level since here so far     Tikosyn load completed     Rates remained difficult to control despite improved respiratory status, post termination pauses were also noted, as long as 3.5 seconds, and underwent PPM implant yesterday with Dr. Curt [name]     Left pneumothorax post PPM required chest tube     CXR today with no residual PTX, reviwed by Dr. Curt [name] with stable lead positions     PPM check this morning with stable measurements, device function     Pacer site dressing dry, no hematoma     Rate remains uncontrolled on PO diltiazem, Claudia [name] up-titrate and add metoprolol     Continue Tikosyn, she is having some SR observed as well.       Chest tube management deferred to pulm medicine       2. Respiratory failure Acute on chronic, COPD exacerbation IM noted, not felt to have infectious component Getting steroids, nebs, PRN BIPAP, on n/c O2 this AM     Improved, no need for BIPAP in last 48hours  3. Diastolic CHF mentioned Not felt to be actively overloaded  4. HTN   stable     Claudia Standard, PA-C 09/19/2016 9:44 AM  I have seen and examined this patient with Claudia [name].  Agree with above, note added to reflect my findings.  On exam, tachycardic, irregular, no murmurs, lungs clear. Had pacemaker placed for tachybradycardia syndrome yesterday. This is, located by pneumothorax requiring left chest tube placement. Repeat x-ray shows full expansion of her left lung. On interrogation her device is functioning appropriately. She does continue to remain in rapid atrial fibrillation. Diltiazem was increased to 360 mg and she was put on metoprolol 25 mg every 6 hours. If her rate is not able to be controlled with these interventions, she Claudia [name] potentially need an AV nodal ablation in the future.    Claudia [name] M. Claudia Favero MD 09/19/2016 10:44 AM

## 2016-09-20 ENCOUNTER — Inpatient Hospital Stay (HOSPITAL_COMMUNITY): Payer: Medicare Other

## 2016-09-20 DIAGNOSIS — Z950 Presence of cardiac pacemaker: Secondary | ICD-10-CM

## 2016-09-20 DIAGNOSIS — I5031 Acute diastolic (congestive) heart failure: Secondary | ICD-10-CM

## 2016-09-20 DIAGNOSIS — S270XXD Traumatic pneumothorax, subsequent encounter: Secondary | ICD-10-CM

## 2016-09-20 DIAGNOSIS — Z938 Other artificial opening status: Secondary | ICD-10-CM

## 2016-09-20 LAB — CBC
HCT: 37.9 % (ref 36.0–46.0)
Hemoglobin: 12.1 g/dL (ref 12.0–15.0)
MCH: 27.7 pg (ref 26.0–34.0)
MCHC: 31.9 g/dL (ref 30.0–36.0)
MCV: 86.7 fL (ref 78.0–100.0)
PLATELETS: 161 10*3/uL (ref 150–400)
RBC: 4.37 MIL/uL (ref 3.87–5.11)
RDW: 13.7 % (ref 11.5–15.5)
WBC: 15.5 10*3/uL — ABNORMAL HIGH (ref 4.0–10.5)

## 2016-09-20 LAB — BASIC METABOLIC PANEL
Anion gap: 11 (ref 5–15)
BUN: 26 mg/dL — AB (ref 6–20)
CO2: 33 mmol/L — ABNORMAL HIGH (ref 22–32)
CREATININE: 1.19 mg/dL — AB (ref 0.44–1.00)
Calcium: 8.9 mg/dL (ref 8.9–10.3)
Chloride: 93 mmol/L — ABNORMAL LOW (ref 101–111)
GFR calc Af Amer: 48 mL/min — ABNORMAL LOW (ref >60)
GFR, EST NON AFRICAN AMERICAN: 42 mL/min — AB (ref >60)
GLUCOSE: 111 mg/dL — AB (ref 65–99)
Potassium: 3.9 mmol/L (ref 3.5–5.1)
SODIUM: 137 mmol/L (ref 135–145)

## 2016-09-20 LAB — PROTIME-INR
INR: 2.08
Prothrombin Time: 23.7 seconds — ABNORMAL HIGH (ref 11.4–15.2)

## 2016-09-20 LAB — MAGNESIUM: MAGNESIUM: 2 mg/dL (ref 1.7–2.4)

## 2016-09-20 LAB — GLUCOSE, CAPILLARY: Glucose-Capillary: 89 mg/dL (ref 65–99)

## 2016-09-20 MED ORDER — POTASSIUM CHLORIDE CRYS ER 20 MEQ PO TBCR
30.0000 meq | EXTENDED_RELEASE_TABLET | Freq: Once | ORAL | Status: AC
  Administered 2016-09-20: 30 meq via ORAL
  Filled 2016-09-20: qty 1

## 2016-09-20 MED ORDER — METOPROLOL TARTRATE 25 MG PO TABS
37.5000 mg | ORAL_TABLET | Freq: Four times a day (QID) | ORAL | Status: DC
  Administered 2016-09-20 – 2016-09-21 (×4): 37.5 mg via ORAL
  Filled 2016-09-20 (×4): qty 1

## 2016-09-20 MED ORDER — WARFARIN SODIUM 1 MG PO TABS
1.0000 mg | ORAL_TABLET | Freq: Once | ORAL | Status: AC
  Administered 2016-09-20: 1 mg via ORAL
  Filled 2016-09-20: qty 1
  Filled 2016-09-20: qty 0.5

## 2016-09-20 MED ORDER — "THROMBI-PAD 3""X3"" EX PADS"
1.0000 | MEDICATED_PAD | Freq: Once | CUTANEOUS | Status: AC
  Administered 2016-09-20: 1 via TOPICAL
  Filled 2016-09-20: qty 1

## 2016-09-20 NOTE — Progress Notes (Signed)
PROGRESS NOTE    Claudia [name]  [id number] DOB: 18-Nov-1932 DOA: 09/09/2016 PCP: Pcp Not In System    Brief Narrative:  83yo F admitted 1/27 with dyspnea, cough and hypoxia thought related to AECOPD.  Found to be in AF with RVR 1/28 (responded well to sotalol / metoprolol).  Cardiology following for concerns of tachy-brady syndrome.  She had an episode of respiratory distress 1/31 and was transferred to ICU for bipap.  Course complicated by AF with RVR and episodes of bradycardia.    PMHx of paroxysmal AFib on warfarin, COPD, chronic diastolic HF, and HTN  2/5 left pnx post PPM insertion    Assessment & Plan:   Principal Problem:   Acute on chronic respiratory failure (HCC) Active Problems:   COPD with acute exacerbation (HCC)   Tachy-brady syndrome (HCC)   Pneumothorax, traumatic   Long term current use of anticoagulant therapy   Paroxysmal atrial fibrillation (HCC)   HTN (hypertension)   COPD exacerbation (HCC)   Chronic diastolic CHF (congestive heart failure) (HCC)   Hypoxia   Respiratory distress   Chronic atrial fibrillation (HCC)   Acute respiratory failure with hypoxemia (HCC)   Acute delirium   Cardiac pacemaker in situ   S/P chest tube placement ([place])  #1 acute respiratory failure with hypoxia secondary to acute COPD exacerbation Patient with acute respiratory failure with hypoxia thought secondary to acute COPD exacerbation requiring BiPAP. BiPAP currently off with some improvement with respirations. Patient also noted to have a underlying tachybradycardia syndrome that is being managed by cardiology. Continue Xopenex and Atrovent nebulizers 3 times daily pulmonary hygiene, steroid taper. Chest tube has been discontinued by critical care repeat chest x-rays pending in 4 hours in the morning of 09/21/2016. Continue to wean O2. Per [facility].  #2 tachybradycardia syndrome/A. fib with RVR and SSS status post PPM 09/18/2016 Patient with improvement with heart rate and  currently in normal sinus rhythm. Continue digoxin, Cardizem, Tikosyn for rate control. Patient on Coumadin for anticoagulation. INR is therapeutic. Per cardiology.  #3 metabolic alkalosis in the setting of chronic hypercarbia and likely exacerbated by diuretics Continue to monitor. Diuretics on hold. Follow.  #4 acute delirium May be secondary to steroids. Steroids being tapered off per pulmonary. Patient with clinical improvement. Follow.  #5 left pneumothorax post PPM insertion Chest tube has been removed per critical care today. Repeat chest x-ray this afternoon and tomorrow morning. Per PCCM.  #6 chronic diastolic heart failure Currently euvolemic. Per cardiology.  #7 hypertension Blood pressure borderline. Monitor closely while patient on Cardizem, metoprolol, digoxin. Per cardiology.  DVT prophylaxis: Coumadin Code Status: DO NOT RESUSCITATE Family Communication: Updated patient and family at bedside. Disposition Plan: Remain the step down unit today. Home versus skilled nursing facility pending PT evaluation and medical stability.   Consultants:   Cardiology: Dr. Debara Pickett 09/12/2016  Electrophysiology Dr.Camintz 09/13/2016  PCCM Dr. Nelda Marseille 09/13/2016  Procedures:  2-D echo 09/13/2016  Pacemaker implantation per Dr. Baird Kay 09/18/2016  Chest x-ray 09/20/2016, 09/19/2016, 09/18/2016, 09/16/2016   SIGNIFICANT EVENTS: 1/27  Admitted with dyspnea and hypoxic respiratory failure 1/28  AFib with RVR, responded to metoprolol, sotalol 1/30  Bradycardia in 30s, then AFib with RVR in 150s 1/31  Transferred to ICU due to respiratory distress, tachy-brady 2/03  Started on CCB gtt 2/04  Rate controlled.  2/5 pnx fromm PPM placed 2/5 2/7 dc chest tube  LINES/TUBES: PIV 1/27 2/5 left chest tube>> 09/20/2016   Antimicrobials:   Oral Levaquin 09/09/2016>>>>> 09/12/2016  IV  vancomycin 09/18/2016 1   Subjective: Patient denies any chest pain. Patient denies any  shortness of breath. Patient however looks apprehensive. Patient states chest tube was just removed.  Objective: Vitals:   09/20/16 0829 09/20/16 0900 09/20/16 1000 09/20/16 1100  BP:   (!) 113/57   Pulse:  69 65   Resp:  19 (!) 22   Temp: 98 F (36.7 C)   98.1 F (36.7 C)  TempSrc: Oral     SpO2:  95% 92%   Weight:      Height:        Intake/Output Summary (Last 24 hours) at 09/20/16 1137 Last data filed at 09/20/16 0800  Gross per 24 hour  Intake              480 ml  Output             1303 ml  Net             -823 ml   Filed Weights   09/18/16 0444 09/19/16 0500 09/20/16 0400  Weight: 59.6 kg (131 lb 6.3 oz) 62 kg (136 lb 11 oz) 61.4 kg (135 lb 5.8 oz)    Examination:  General exam: Appears calm and comfortable  Respiratory system: Some coarse BS anterior lung fields. Poor to fair air movement. Cardiovascular system: S1 & S2 heard, RRR. No JVD, murmurs, rubs, gallops or clicks. No pedal edema. Gastrointestinal system: Abdomen is nondistended, soft and nontender. No organomegaly or masses felt. Normal bowel sounds heard. Central nervous system: Alert and oriented. No focal neurological deficits. Extremities: Symmetric 5 x 5 power. Skin: No rashes, lesions or ulcers Psychiatry: Judgement and insight appear normal. Mood & affect appropriate.     Data Reviewed: I have personally reviewed following labs and imaging studies  CBC:  Recent Labs Lab 09/15/16 0446 09/16/16 0254 09/18/16 0210 09/19/16 0915 09/20/16 0406  WBC 5.7 8.2 12.4* 18.3* 15.5*  HGB 12.5 12.3 15.1* 13.2 12.1  HCT 42.6 40.8 46.5* 41.8 37.9  MCV 94.9 92.5 86.8 87.6 86.7  PLT 161 153 192 202 [id number]   Basic Metabolic Panel:  Recent Labs Lab 09/14/16 0231 09/15/16 0446 09/16/16 0254 09/17/16 0209 09/18/16 0210 09/19/16 0915 09/20/16 0406  NA 144 141 141 139 138 134* 137  K 5.3* 5.3* 4.8 4.3 4.5 4.4 3.9  CL 99* 100* 100* 97* 95* 90* 93*  CO2 38* 32 35* 34* 35* 30 33*  GLUCOSE 138* 128*  129* 112* 119* 113* 111*  BUN 37* 43* 37* 32* 30* 34* 26*  CREATININE 1.00 0.91 0.90 0.88 0.92 1.42* 1.19*  CALCIUM 9.5 9.3 9.1 8.7* 8.8* 9.0 8.9  MG 2.5* 2.5* 2.3 2.3 2.1  --  2.0  PHOS 3.8 3.6 2.4*  --   --   --   --    GFR: Estimated Creatinine Clearance: 32 mL/min (by C-G formula based on SCr of 1.19 mg/dL (H)). Liver Function Tests:  Recent Labs Lab 09/18/16 0210  AST 20  ALT 18  ALKPHOS 69  BILITOT 1.1  PROT 4.8*  ALBUMIN 3.1*   No results for input(s): LIPASE, AMYLASE in the last 168 hours. No results for input(s): AMMONIA in the last 168 hours. Coagulation Profile:  Recent Labs Lab 09/16/16 0254 09/17/16 0209 09/18/16 0210 09/19/16 0915 09/20/16 0406  INR 5.32* 4.86* 2.44 1.60 2.08   Cardiac Enzymes: No results for input(s): CKTOTAL, CKMB, CKMBINDEX, TROPONINI in the last 168 hours. BNP (last 3 results) No results for input(s): PROBNP in  the last 8760 hours. HbA1C: No results for input(s): HGBA1C in the last 72 hours. CBG:  Recent Labs Lab 09/16/16 0818 09/17/16 0753 09/18/16 0826 09/19/16 0825 09/20/16 0849  GLUCAP 126* 101* 123* 103* 89   Lipid Profile: No results for input(s): CHOL, HDL, LDLCALC, TRIG, CHOLHDL, LDLDIRECT in the last 72 hours. Thyroid Function Tests: No results for input(s): TSH, T4TOTAL, FREET4, T3FREE, THYROIDAB in the last 72 hours. Anemia Panel: No results for input(s): VITAMINB12, FOLATE, FERRITIN, TIBC, IRON, RETICCTPCT in the last 72 hours. Sepsis Labs: No results for input(s): PROCALCITON, LATICACIDVEN in the last 168 hours.  Recent Results (from the past 240 hour(s))  Respiratory Panel by PCR     Status: None   Collection Time: 09/10/16  1:22 PM  Result Value Ref Range Status   Adenovirus NOT DETECTED NOT DETECTED Final   Coronavirus 229E NOT DETECTED NOT DETECTED Final   Coronavirus HKU1 NOT DETECTED NOT DETECTED Final   Coronavirus NL63 NOT DETECTED NOT DETECTED Final   Coronavirus OC43 NOT DETECTED NOT DETECTED  Final   Metapneumovirus NOT DETECTED NOT DETECTED Final   Rhinovirus / Enterovirus NOT DETECTED NOT DETECTED Final   Influenza A NOT DETECTED NOT DETECTED Final   Influenza B NOT DETECTED NOT DETECTED Final   Parainfluenza Virus 1 NOT DETECTED NOT DETECTED Final   Parainfluenza Virus 2 NOT DETECTED NOT DETECTED Final   Parainfluenza Virus 3 NOT DETECTED NOT DETECTED Final   Parainfluenza Virus 4 NOT DETECTED NOT DETECTED Final   Respiratory Syncytial Virus NOT DETECTED NOT DETECTED Final   Bordetella pertussis NOT DETECTED NOT DETECTED Final   Chlamydophila pneumoniae NOT DETECTED NOT DETECTED Final   Mycoplasma pneumoniae NOT DETECTED NOT DETECTED Final  MRSA PCR Screening     Status: None   Collection Time: 09/13/16  1:53 AM  Result Value Ref Range Status   MRSA by PCR NEGATIVE NEGATIVE Final    Comment:        The GeneXpert MRSA Assay (FDA approved for NASAL specimens only), is one component of a comprehensive MRSA colonization surveillance program. It is not intended to diagnose MRSA infection nor to guide or monitor treatment for MRSA infections.   Surgical pcr screen     Status: None   Collection Time: 09/18/16 12:07 PM  Result Value Ref Range Status   MRSA, PCR NEGATIVE NEGATIVE Final   Staphylococcus aureus NEGATIVE NEGATIVE Final    Comment:        The Xpert SA Assay (FDA approved for NASAL specimens in patients over 18 years of age), is one component of a comprehensive surveillance program.  Test performance has been validated by [facility] for patients greater than or equal to 10 year old. It is not intended to diagnose infection nor to guide or monitor treatment.          Radiology Studies: Dg Chest 2 View  Result Date: 09/19/2016 CLINICAL DATA:  Status post pacemaker placement. History of COPD, hypertension, former smoker. EXAM: CHEST  2 VIEW COMPARISON:  Portable chest x-ray of September 18, 2016 and September 15, 2016 FINDINGS: The lungs are mildly  hyperinflated. No left-sided pneumothorax is visible today. The left-sided chest tube overlies the interspace between the left fourth and fifth rib. There is a trace of blunting of the left lateral costophrenic angle. There is hazy density in the left infrahilar region which likely reflects subsegmental atelectasis. The heart is normal in size. There is calcification in the wall of the aortic arch. The ICD  electrodes are in stable position with the generator in the left pectoral region. There is stable scarring in the right upper lobe. IMPRESSION: No residual pneumothorax on the left. Hazy increased density in the left infrahilar region may reflect subsegmental atelectasis. No CHF. Thoracic aortic atherosclerosis. Electronically Signed   By: David  Martinique M.D.   On: 09/19/2016 07:29   Dg Chest Port 1 View  Result Date: 09/20/2016 CLINICAL DATA:  Recent pneumothorax with chest tube placement EXAM: PORTABLE CHEST 1 VIEW COMPARISON:  September 19, 2016 FINDINGS: Chest tube remains on the left with the chest tube tip slightly more laterally positioned compared to 1 day prior. There is no evident pneumothorax currently. There is mild left base atelectasis. There is also atelectasis in the right upper lobe. Lungs elsewhere clear. Heart size and pulmonary vascularity are normal. Pacemaker leads are attached to the right atrium and right ventricle. There is atherosclerotic calcification in the aorta and right carotid artery. No adenopathy. No bone lesions. IMPRESSION: Chest tube remains on the left without pneumothorax. Mild left base and right upper lobe atelectatic change. Lungs elsewhere clear. Stable cardiac silhouette. There is aortic atherosclerosis. There is also calcification in the right carotid artery. Electronically Signed   By: Lowella Grip III M.D.   On: 09/20/2016 07:12   Dg Chest Port 1 View  Result Date: 09/18/2016 CLINICAL DATA:  Follow-up left pneumothorax after chest tube placement. EXAM:  PORTABLE CHEST 1 VIEW 2:49 p.m.: COMPARISON:  Portable chest x-ray earlier today 2:13 p.m. and previously. FINDINGS: Left chest tube in place with resolution of the left pneumothorax. Mild atelectasis at the left lung base. Lungs otherwise clear. Cardiac silhouette upper normal in size to slightly enlarged, unchanged. Left subclavian dual lead transvenous pacemaker unchanged and intact. IMPRESSION: 1. Resolution of left pneumothorax after chest tube placement. 2. Mild left basilar atelectasis. No acute cardiopulmonary disease otherwise. Electronically Signed   By: Evangeline Dakin M.D.   On: 09/18/2016 15:00   Dg Chest Port 1 View  Result Date: 09/18/2016 CLINICAL DATA:  Increased shortness of breath, COPD, hypertension EXAM: PORTABLE CHEST 1 VIEW COMPARISON:  Portable exam 1413 hours compared to 09/16/2016 FINDINGS: LEFT subclavian transvenous pacemaker with leads projecting over RIGHT atrium and RIGHT ventricle. Normal heart size. Large LEFT pneumothorax with near complete collapse of the LEFT lung and mediastinal shift LEFT-to-RIGHT compatible with tension pneumothorax. Scarring in RIGHT upper lobe with underlying emphysematous changes. Atherosclerotic calcification aorta. No pleural effusion or acute osseous findings. IMPRESSION: LEFT tension pneumothorax with near complete collapse of LEFT lung and LEFT-to-RIGHT mediastinal shift. New LEFT subclavian sequential pacemaker. Underlying COPD changes. Critical Value/emergent results were called by telephone at the time of interpretation on 09/18/2016 at 1422 hours to Dr. Allegra Lai , who verbally acknowledged these results. Electronically Signed   By: Lavonia Dana M.D.   On: 09/18/2016 14:25        Scheduled Meds: . aspirin EC  81 mg Oral Daily  . digoxin  0.25 mg Intravenous Once  . diltiazem  360 mg Oral Daily  . dofetilide  250 mcg Oral BID  . feeding supplement (ENSURE ENLIVE)  237 mL Oral BID BM  . ipratropium-albuterol  3 mL Nebulization Q6H  .  mouth rinse  15 mL Mouth Rinse BID  . metoprolol tartrate  37.5 mg Oral Q6H  . pantoprazole  40 mg Oral Daily  . polyvinyl alcohol  1 drop Both Eyes TID  . predniSONE  30 mg Oral Q breakfast  . sodium  chloride flush  3 mL Intravenous Q12H  . warfarin  1 mg Oral ONCE-1800  . Warfarin - Pharmacist Dosing Inpatient   Does not apply q1800   Continuous Infusions: . diltiazem (CARDIZEM) infusion Stopped (09/18/16 1845)     LOS: 10 days    Time spent: 90 mins    Rashee Marschall, MD [facility] Pager [phone]  If 7PM-7AM, please contact night-coverage [email or web address] Password [email or web address] 09/20/2016, 11:37 AM

## 2016-09-20 NOTE — Progress Notes (Signed)
Chest tube removed by Jamie Kato, RN.  Site oozing; Dr. Nelda Marseille notified.  Thrombi pad, vaseline gauze and pressure dressing applied.  CXR ordered in 4 hours & in AM. Will continue to monitor closely. [place], Ardeth Sportsman

## 2016-09-20 NOTE — Progress Notes (Signed)
Assisted patient to [facility] in hopes change of position would help with the urge to void.  Patient unable to void and states she does not feel like she needs to.  Bladder scan performed and yielded 137 cc.  Dr. Grandville Silos notified of need to I & O patient overnight.  Will continue to monitor. [place], Ardeth Sportsman

## 2016-09-20 NOTE — Progress Notes (Signed)
ANTICOAGULATION CONSULT NOTE - Follow Up Consult  Pharmacy Consult for warfarin Indication: atrial fibrillation  Allergies  Allergen Reactions  . Augmentin [Amoxicillin-Pot Clavulanate] Shortness Of Breath  . Doxycycline Shortness Of Breath  . Nitrofurantoin Shortness Of Breath  . Amiodarone Hcl Nausea And Vomiting  . Clarithromycin Other (See Comments)    Crazy feeling, confusion  . Clonazepam Other (See Comments)    Doesn't remember reaction  . Codeine Nausea And Vomiting  . Diltiazem Hcl Nausea And Vomiting  . Fish Oil Nausea And Vomiting  . Flecainide Nausea Only  . Multaq [Dronedarone] Nausea Only  . Rosuvastatin Other (See Comments)    Leg cramps   . Zetia [Ezetimibe] Other (See Comments)    Leg cramps    Patient Measurements: Height: 5\' 2"  (157.5 cm) Weight: 135 lb 5.8 oz (61.4 kg) IBW/kg (Calculated) : 50.1  Vital Signs: Temp: 98 F (36.7 C) (02/07 0829) Temp Source: Oral (02/07 0829) BP: 98/57 (02/07 0800) Pulse Rate: 69 (02/07 0900)  Labs:  Recent Labs  09/18/16 0210 09/19/16 0915 09/20/16 0406  HGB 15.1* 13.2 12.1  HCT 46.5* 41.8 37.9  PLT 192 202 161  LABPROT 27.0* 19.2* 23.7*  INR 2.44 1.60 2.08  CREATININE 0.92 1.42* 1.19*    Estimated Creatinine Clearance: 32 mL/min (by C-G formula based on SCr of 1.19 mg/dL (H)).  Assessment: 64 YOF admitted for SOB on PTA warfarin for Afib. Pharmacy consulted to dose warfarin. She is also noted on Tikosyn with recent pacer placement on 2/5 and chest tube placement (blood oozing from CT insertion site).  -INR= 2.08, Hg with slow trend down  PTA warfarin dose is 2mg  MWF, 1mg  all other days (dose very stable as outpatient per clinic records)  Goal of Therapy:  INR 2-3 Monitor platelets by anticoagulation protocol: Yes   Plan:  -Coumadin 1mg  po today -Daily PT/INR  Hildred Laser, Pharm D 09/20/2016 9:27 AM

## 2016-09-20 NOTE — Progress Notes (Signed)
Pt arrived to rm 4np14 at this time.  Pt on 6L o2 via [place] satting around 92%.  No c/o anything.  No s/s of any acute distress.  Call bell in reach.

## 2016-09-20 NOTE — Progress Notes (Signed)
[place] Progress Note Patient Name: JAMESON GHARIB DOB: 17-May-1933 MRN: [id number]   Date of Service  09/20/2016  HPI/Events of Note  Urine bladder volume 600cc  eICU Interventions  Straight cath. Monitor for urinary retention.      Intervention Category Minor Interventions: Other:  [name] Pinzon 09/20/2016, 4:43 AM

## 2016-09-20 NOTE — Progress Notes (Signed)
PULMONARY / CRITICAL CARE MEDICINE   Name: Claudia [name] MRN: [id number] DOB: June 04, 1933    ADMISSION DATE:  09/09/2016 CONSULTATION DATE:  09/13/16  REFERRING MD:  Ree Kida, MD  CHIEF COMPLAINT:  Shortness of breath  BRIEF SUMMARY:  83yo F admitted 1/27 with dyspnea, cough and hypoxia thought related to AECOPD.  Found to be in AF with RVR 1/28 (responded well to sotalol / metoprolol).  Cardiology following for concerns of tachy-brady syndrome.  She had an episode of respiratory distress 1/31 and was transferred to ICU for bipap.  Course complicated by AF with RVR and episodes of bradycardia.    PMHx of paroxysmal AFib on warfarin, COPD, chronic diastolic HF, and HTN  2/5 left pnx post PPM insertion   SUBJECTIVE:  Co left chest pain VITAL SIGNS: BP (!) 98/57 (BP Location: Right Arm)   Pulse 69   Temp 98 F (36.7 C) (Oral)   Resp 19   Ht 5\' 2"  (1.575 m)   Wt 135 lb 5.8 oz (61.4 kg)   SpO2 95%   BMI 24.76 kg/m  2 liters   HEMODYNAMICS:    VENTILATOR SETTINGS:    INTAKE / OUTPUT: I/O last 3 completed shifts: In: 360 [P.O.:360] Out: [id number] [Urine:1510; Chest Tube:149]  General: elderly female in NAD lying in bed HEENT: MM pink/moist PSY: anxious at times Neuro: AAOx4, speech clear, MAE, generalized weakness  CV: s1s2 irr irr, no m/r/g,  PULM: even/non-labored, lungs bilaterally with few soft wheezes  Left ct without air leak off suction [id number], non-tender, bsx4 active  Extremities: warm/dry, trace LE edema  Skin: no rashes or lesions    LABS:  BMET  Recent Labs Lab 09/18/16 0210 09/19/16 0915 09/20/16 0406  NA 138 134* 137  K 4.5 4.4 3.9  CL 95* 90* 93*  CO2 35* 30 33*  BUN 30* 34* 26*  CREATININE 0.92 1.42* 1.19*  GLUCOSE 119* 113* 111*   Electrolytes  Recent Labs Lab 09/14/16 0231 09/15/16 0446 09/16/16 0254 09/17/16 0209 09/18/16 0210 09/19/16 0915 09/20/16 0406  CALCIUM 9.5 9.3 9.1 8.7* 8.8* 9.0 8.9  MG 2.5* 2.5* 2.3 2.3 2.1  --  2.0   PHOS 3.8 3.6 2.4*  --   --   --   --    CBC  Recent Labs Lab 09/18/16 0210 09/19/16 0915 09/20/16 0406  WBC 12.4* 18.3* 15.5*  HGB 15.1* 13.2 12.1  HCT 46.5* 41.8 37.9  PLT 192 202 161   Coag's  Recent Labs Lab 09/18/16 0210 09/19/16 0915 09/20/16 0406  INR 2.44 1.60 2.08   Sepsis Markers No results for input(s): LATICACIDVEN, PROCALCITON, O2SATVEN in the last 168 hours.  ABG No results for input(s): PHART, PCO2ART, PO2ART in the last 168 hours. Liver Enzymes  Recent Labs Lab 09/18/16 0210  AST 20  ALT 18  ALKPHOS 69  BILITOT 1.1  ALBUMIN 3.1*   Cardiac Enzymes No results for input(s): TROPONINI, PROBNP in the last 168 hours.   Glucose  Recent Labs Lab 09/15/16 0825 09/16/16 0818 09/17/16 0753 09/18/16 0826 09/19/16 0825 09/20/16 0849  GLUCAP 126* 126* 101* 123* 103* 89   Imaging 2/7 cxr resolved left pnx   STUDIES:  CXR 1/30 >> COPD changes, no acute abnormalities  2/5 cxr with new PPM and lt pnx  CULTURES: RVP 1/28 >> negative  ANTIBIOTICS: None  SIGNIFICANT EVENTS: 1/27  Admitted with dyspnea and hypoxic respiratory failure 1/28  AFib with RVR, responded to metoprolol, sotalol 1/30  Bradycardia in 30s, then  AFib with RVR in 150s 1/31  Transferred to ICU due to respiratory distress, tachy-brady 2/03  Started on CCB gtt 2/04  Rate controlled.  2/5 pnx fromm PPM placed 2/5 2/7 dc chest tube  LINES/TUBES: PIV 1/27 2/5 left chest tube>>   ASSESSMENT / PLAN:  Acute hypoxic and hypercarbic respiratory failure in the setting of AECOPD, further exacerbated by AF w/ RVR 2/5 left pnx post PPM insertion Plan Continue O2, wean for sats 90-95% Pulmonary hygiene - IS, mobilize as able  Treat underlying tachy-brady syndrome  Continue Xopenex + Atrovent TID  Wean prednisone, 30 mg QD 2/6 take suction off ct, check cxr in am 2/7 2/7 will dc chest tube and check cxr in 4 hours and in am of 2/8  Af w/ RVR and SSS Hx diastolic HF PPM  placed 2/5 with pnx Lab Results  Component Value Date   INR 2.08 09/20/2016   INR 1.60 09/19/2016   INR 2.44 09/18/2016    Plan Cardiology following , currently sr Tele monitoring   tachycardia  Continue digoxin, tikosyn and cardizem Coumadin per pharmacy   Metabolic alkalosis in setting of chronic hypercarbia and probably exacerbated by diuretics  Plan Trend BMP  Replace electrolytes as indicated   Delirium  Plan  Wean steroids to off as able  Supportive care Promote sleep / wake cycle   FAMILY  - Updates:  Patient and daughter updated at bedside 2/57.  Plan to dc suction and follow c x r . 2/7 will dc chest tube. All other issues per [facility].   Richardson Landry Minor ACNP Maryanna Shape PCCM Pager [phone] till 3 pm If no answer page [phone] 09/20/2016, 10:08 AM  Attending Note:  83 year old female with PMH above that is DNR who suffered a PTX after placement of a pacer.  CT in place and on suction.  On exam, BS heard bilaterally.  I reviewed CXR myself, CT in good position and no PTX.  Discussed with PCCM-NP and TRH-MD.  I also reviewed CXR from this AM myself, no PTX noted.  PTX:             - D/C chest tube             - CXR in AM, if normal then no need for further CXR  Hypoxemia:             - Titrate O2 for sat of 88-92%.             - Ambulatory desaturation study ordered  Disposition:             - Transfer to SDU             - [facility]  Delirium: improving             - Prednisone to 30 mg PO daily, titrate over the next week to off             - Supportive care.             - Promote sleep/wake cycle.  COPD             - BD as ordered             - Ambulate  Patient's daughter updated bedside.  PCCM will see again in AM with a repeat CXR.  Patient seen and examined, agree with above note.  I dictated the care and orders written for this patient under my  direction.  Rush Farmer, MD [phone]

## 2016-09-20 NOTE — Progress Notes (Signed)
SUBJECTIVE: The patient denies chest pain, no active shortness of breath on n/c O2, she is OOB  To the chair, was in the BR to bathe, became a little breathless with that but feels good.  Marland Kitchen aspirin EC  81 mg Oral Daily  . digoxin  0.25 mg Intravenous Once  . diltiazem  360 mg Oral Daily  . dofetilide  250 mcg Oral BID  . feeding supplement (ENSURE ENLIVE)  237 mL Oral BID BM  . ipratropium-albuterol  3 mL Nebulization Q6H  . mouth rinse  15 mL Mouth Rinse BID  . metoprolol tartrate  37.5 mg Oral Q6H  . pantoprazole  40 mg Oral Daily  . polyvinyl alcohol  1 drop Both Eyes TID  . predniSONE  30 mg Oral Q breakfast  . sodium chloride flush  3 mL Intravenous Q12H  . warfarin  1 mg Oral ONCE-1800  . Warfarin - Pharmacist Dosing Inpatient   Does not apply q1800   . diltiazem (CARDIZEM) infusion Stopped (09/18/16 1845)    OBJECTIVE: Physical Exam: Vitals:   09/20/16 0829 09/20/16 0900 09/20/16 1000 09/20/16 1100  BP:   (!) 113/57   Pulse:  69 65   Resp:  19 (!) 22   Temp: 98 F (36.7 C)   98.1 F (36.7 C)  TempSrc: Oral     SpO2:  95% 92%   Weight:      Height:        Intake/Output Summary (Last 24 hours) at 09/20/16 1211 Last data filed at 09/20/16 0800  Gross per 24 hour  Intake              360 ml  Output             1303 ml  Net             -943 ml    Telemetry is reviewed by myself, AFib low 100's > SR currently  GEN- The patient is well appearing, alert and oriented x 3 today.   Head- normocephalic, atraumatic Eyes-  Sclera clear, conjunctiva pink Ears- hearing intact Oropharynx- clear Neck- supple, no JVP Lungs- CTA, soft exp. wheezes, normal work of breathing, no absent BS Heart- tachycardic, irregular, no significant murmurs, no rubs or gallops GI- soft, NT, ND Extremities- no clubbing, cyanosis, or edema Skin- no rash or lesion Psych- euthymic mood, full affect Neuro- no gross deficits appreciated  LABS: Basic Metabolic Panel:  Recent Labs  09/18/16 0210 09/19/16 0915 09/20/16 0406  NA 138 134* 137  K 4.5 4.4 3.9  CL 95* 90* 93*  CO2 35* 30 33*  GLUCOSE 119* 113* 111*  BUN 30* 34* 26*  CREATININE 0.92 1.42* 1.19*  CALCIUM 8.8* 9.0 8.9  MG 2.1  --  2.0   Liver Function Tests:  Recent Labs  09/18/16 0210  AST 20  ALT 18  ALKPHOS 69  BILITOT 1.1  PROT 4.8*  ALBUMIN 3.1*   CBC:  Recent Labs  09/19/16 0915 09/20/16 0406  WBC 18.3* 15.5*  HGB 13.2 12.1  HCT 41.8 37.9  MCV 87.6 86.7  PLT 202 161    RADIOLOGY: Dg Chest Port 1 View Result Date: 09/16/2016 CLINICAL DATA:  Shortness of breath, respiratory failure, hypoxia and COPD exacerbation. Atrial fibrillation with rapid ventricular response. EXAM: PORTABLE CHEST 1 VIEW COMPARISON:  09/15/2016 FINDINGS: The heart size and mediastinal contours are within normal limits. Stable emphysematous lung disease. There is no evidence of pulmonary edema, consolidation, pneumothorax, nodule  or pleural fluid. The visualized skeletal structures are unremarkable. IMPRESSION: Stable emphysema. Electronically Signed   By: Aletta Edouard M.D.   On: 09/16/2016 14:11     ASSESSMENT AND PLAN:   1. PAF w/RVR, tachy-brady Echo EF 60-65%, normal wall motion, trivial pericardial effusion, grade II DD INR today is 2.08     Tikosyn load completed days ago     She is in SR this morning, EKG reviewed with Dr. Curt Bears, measured/calculated QTc 454-452ms     Left pneumothorax post PPM required chest tube     Pacer site dressing dry, no hematoma     Chest tube d/c'd today, pending repeat CXR  OK for a step down bed from our standpoint       2. Respiratory failure Acute on chronic, COPD exacerbation IM noted, not felt to have infectious component     Weaning steroids  3. Diastolic CHF mentioned Not felt to be actively overloaded  4. HTN   stable   5. Leukocytosis     Afebrile, pt denies any symptoms of illness     Lower today     ? 2/2  steroids    [name], PA-C 09/20/2016 12:11 PM   I have seen and examined this patient with [name].  Agree with above, note added to reflect my findings.  On exam, irregular, tachycardic, no murmurs. Went back into atrial fibrillation overnight. Since she is feeling better than the last 24 hours. We'll plan to increase her metoprolol to 37.5 mg every 6 hours. Should she reverted to sinus rhythm, she would likely be able to take 75 mg twice a day of metoprolol as well as her diltiazem and dofetilide.    Mak Bonny M. Jadalee Westcott MD 09/20/2016 3:55 PM

## 2016-09-20 NOTE — Progress Notes (Signed)
[place] Progress Note Patient Name: Claudia [name] DOB: 1933-01-14 MRN: [id number]   Date of Service  09/20/2016  HPI/Events of Note  Blood oozing from chest tube insertion site BP stable  eICU Interventions  Apply thrombipads     Intervention Category Intermediate Interventions: Other:  Dellis Voght 09/20/2016, 3:08 AM

## 2016-09-21 ENCOUNTER — Inpatient Hospital Stay (HOSPITAL_COMMUNITY): Payer: Medicare Other

## 2016-09-21 DIAGNOSIS — J9601 Acute respiratory failure with hypoxia: Secondary | ICD-10-CM

## 2016-09-21 DIAGNOSIS — J9602 Acute respiratory failure with hypercapnia: Secondary | ICD-10-CM

## 2016-09-21 DIAGNOSIS — D72829 Elevated white blood cell count, unspecified: Secondary | ICD-10-CM

## 2016-09-21 LAB — BASIC METABOLIC PANEL
ANION GAP: 8 (ref 5–15)
BUN: 29 mg/dL — AB (ref 6–20)
CHLORIDE: 95 mmol/L — AB (ref 101–111)
CO2: 32 mmol/L (ref 22–32)
Calcium: 8.5 mg/dL — ABNORMAL LOW (ref 8.9–10.3)
Creatinine, Ser: 1.14 mg/dL — ABNORMAL HIGH (ref 0.44–1.00)
GFR calc non Af Amer: 44 mL/min — ABNORMAL LOW (ref >60)
GFR, EST AFRICAN AMERICAN: 51 mL/min — AB (ref >60)
Glucose, Bld: 86 mg/dL (ref 65–99)
POTASSIUM: 4.4 mmol/L (ref 3.5–5.1)
SODIUM: 135 mmol/L (ref 135–145)

## 2016-09-21 LAB — CBC WITH DIFFERENTIAL/PLATELET
Basophils Absolute: 0 10*3/uL (ref 0.0–0.1)
Basophils Relative: 0 %
Eosinophils Absolute: 0 10*3/uL (ref 0.0–0.7)
Eosinophils Relative: 0 %
HEMATOCRIT: 37.4 % (ref 36.0–46.0)
HEMOGLOBIN: 11.8 g/dL — AB (ref 12.0–15.0)
LYMPHS ABS: 1.6 10*3/uL (ref 0.7–4.0)
Lymphocytes Relative: 7 %
MCH: 27.6 pg (ref 26.0–34.0)
MCHC: 31.6 g/dL (ref 30.0–36.0)
MCV: 87.4 fL (ref 78.0–100.0)
MONO ABS: 1.9 10*3/uL — AB (ref 0.1–1.0)
MONOS PCT: 8 %
NEUTROS PCT: 85 %
Neutro Abs: 19.6 10*3/uL — ABNORMAL HIGH (ref 1.7–7.7)
Platelets: 214 10*3/uL (ref 150–400)
RBC: 4.28 MIL/uL (ref 3.87–5.11)
RDW: 14 % (ref 11.5–15.5)
WBC: 23.1 10*3/uL — ABNORMAL HIGH (ref 4.0–10.5)

## 2016-09-21 LAB — PROTIME-INR
INR: 2.43
Prothrombin Time: 26.9 seconds — ABNORMAL HIGH (ref 11.4–15.2)

## 2016-09-21 LAB — GLUCOSE, CAPILLARY: GLUCOSE-CAPILLARY: 105 mg/dL — AB (ref 65–99)

## 2016-09-21 LAB — MAGNESIUM: Magnesium: 2 mg/dL (ref 1.7–2.4)

## 2016-09-21 MED ORDER — WARFARIN SODIUM 2 MG PO TABS
1.0000 mg | ORAL_TABLET | Freq: Once | ORAL | Status: AC
Start: 2016-09-21 — End: 2016-09-21
  Administered 2016-09-21: 1 mg via ORAL
  Filled 2016-09-21: qty 0.5

## 2016-09-21 MED ORDER — BENZOCAINE 10 % MT GEL
Freq: Four times a day (QID) | OROMUCOSAL | Status: DC | PRN
Start: 2016-09-21 — End: 2016-09-30
  Administered 2016-09-21: 1 via OROMUCOSAL
  Administered 2016-09-22 – 2016-09-24 (×2): via OROMUCOSAL
  Filled 2016-09-21: qty 9.4

## 2016-09-21 MED ORDER — METOPROLOL TARTRATE 50 MG PO TABS
75.0000 mg | ORAL_TABLET | Freq: Two times a day (BID) | ORAL | Status: DC
  Administered 2016-09-21: 22:00:00 75 mg via ORAL
  Filled 2016-09-21: qty 1

## 2016-09-21 MED ORDER — PREDNISONE 20 MG PO TABS
20.0000 mg | ORAL_TABLET | Freq: Every day | ORAL | Status: DC
  Administered 2016-09-21 – 2016-09-25 (×5): 20 mg via ORAL
  Filled 2016-09-21 (×4): qty 1

## 2016-09-21 MED ORDER — METOPROLOL TARTRATE 5 MG/5ML IV SOLN
5.0000 mg | Freq: Once | INTRAVENOUS | Status: AC
  Administered 2016-09-21: 5 mg via INTRAVENOUS
  Filled 2016-09-21: qty 5

## 2016-09-21 NOTE — Progress Notes (Cosign Needed)
Case management's note reports no prior auth required for Dofetilide, I called the patient's pharmacy, Copay for the patient will be $162.29 I discussed with the patient, she thinks she will be able to manage this financially, and would like to be continued given her current regime is holding her rhythm well these last couple days.  [name], PA-C

## 2016-09-21 NOTE — Progress Notes (Signed)
ANTICOAGULATION CONSULT NOTE - Follow Up Consult  Pharmacy Consult for warfarin Indication: atrial fibrillation  Allergies  Allergen Reactions  . Augmentin [Amoxicillin-Pot Clavulanate] Shortness Of Breath  . Doxycycline Shortness Of Breath  . Nitrofurantoin Shortness Of Breath  . Amiodarone Hcl Nausea And Vomiting  . Clarithromycin Other (See Comments)    Crazy feeling, confusion  . Clonazepam Other (See Comments)    Doesn't remember reaction  . Codeine Nausea And Vomiting  . Diltiazem Hcl Nausea And Vomiting  . Fish Oil Nausea And Vomiting  . Flecainide Nausea Only  . Multaq [Dronedarone] Nausea Only  . Rosuvastatin Other (See Comments)    Leg cramps   . Zetia [Ezetimibe] Other (See Comments)    Leg cramps    Patient Measurements: Height: 5\' 2"  (157.5 cm) Weight: 133 lb 9.6 oz (60.6 kg) IBW/kg (Calculated) : 50.1  Vital Signs: Temp: 98.3 F (36.8 C) (02/08 0830) Temp Source: Oral (02/08 0830) BP: 118/62 (02/08 0830) Pulse Rate: 61 (02/08 0830)  Labs:  Recent Labs  09/19/16 0915 09/20/16 0406 09/21/16 0247 09/21/16 0706  HGB 13.2 12.1 11.8*  --   HCT 41.8 37.9 37.4  --   PLT 202 161 214  --   LABPROT 19.2* 23.7* 26.9*  --   INR 1.60 2.08 2.43  --   CREATININE 1.42* 1.19*  --  1.14*    Estimated Creatinine Clearance: 33.2 mL/min (by C-G formula based on SCr of 1.14 mg/dL (H)).  Assessment: 30 YOF admitted for SOB on PTA warfarin for Afib. Pharmacy consulted to dose warfarin. She is also noted on Tikosyn with recent pacer placement on 2/5 and chest tube placement removed 2/8).  -INR= 2.4, Hg with slow trend down  PTA warfarin dose is 2mg  MWF, 1mg  all other days (dose very stable as outpatient per clinic records)  Goal of Therapy:  INR 2-3 Monitor platelets by anticoagulation protocol: Yes   Plan:  -Coumadin 1mg  po today -Daily PT/INR  Hildred Laser, Pharm D 09/21/2016 11:10 AM

## 2016-09-21 NOTE — Progress Notes (Signed)
SUBJECTIVE: The patient denies chest pain, no active shortness of breath on n/c O2. Chest tube removed yesterday without PTX post removal. In sinus rhythm with short amount of AF overnight. Says that she is still short of breath with mild congestion.  Marland Kitchen aspirin EC  81 mg Oral Daily  . digoxin  0.25 mg Intravenous Once  . diltiazem  360 mg Oral Daily  . dofetilide  250 mcg Oral BID  . feeding supplement (ENSURE ENLIVE)  237 mL Oral BID BM  . ipratropium-albuterol  3 mL Nebulization Q6H  . mouth rinse  15 mL Mouth Rinse BID  . metoprolol tartrate  75 mg Oral BID  . pantoprazole  40 mg Oral Daily  . polyvinyl alcohol  1 drop Both Eyes TID  . predniSONE  30 mg Oral Q breakfast  . sodium chloride flush  3 mL Intravenous Q12H  . Warfarin - Pharmacist Dosing Inpatient   Does not apply q1800   . diltiazem (CARDIZEM) infusion Stopped (09/18/16 1845)    OBJECTIVE: Physical Exam: Vitals:   09/21/16 0300 09/21/16 0327 09/21/16 0400 09/21/16 0632  BP: 124/82 (!) 121/28 (!) 114/48 (!) 118/46  Pulse: (!) 125 (!) 59 (!) 59 62  Resp: (!) 24 16 20    Temp:  97.7 F (36.5 C)    TempSrc:  Oral    SpO2: 90% (!) 89% 92%   Weight:  133 lb 9.6 oz (60.6 kg)    Height:        Intake/Output Summary (Last 24 hours) at 09/21/16 0825 Last data filed at 09/21/16 0215  Gross per 24 hour  Intake              720 ml  Output              300 ml  Net              420 ml    Telemetry is reviewed by myself, sinus rhythm, rate 60  GEN- The patient is well appearing, alert and oriented x 3 today.   Head- normocephalic, atraumatic Eyes-  Sclera clear, conjunctiva pink Ears- hearing intact Oropharynx- clear Neck- supple, no JVP Lungs- CTA, soft exp. wheezes, normal work of breathing, no absent BS Heart- tachycardic, irregular, no significant murmurs, no rubs or gallops GI- soft, NT, ND Extremities- no clubbing, cyanosis, or edema Skin- no rash or lesion Psych- euthymic mood, full affect Neuro- no  gross deficits appreciated  LABS: Basic Metabolic Panel:  Recent Labs  09/20/16 0406 09/21/16 0247 09/21/16 0706  NA 137  --  135  K 3.9  --  4.4  CL 93*  --  95*  CO2 33*  --  32  GLUCOSE 111*  --  86  BUN 26*  --  29*  CREATININE 1.19*  --  1.14*  CALCIUM 8.9  --  8.5*  MG 2.0 2.0  --    Liver Function Tests: No results for input(s): AST, ALT, ALKPHOS, BILITOT, PROT, ALBUMIN in the last 72 hours. CBC:  Recent Labs  09/20/16 0406 09/21/16 0247  WBC 15.5* 23.1*  NEUTROABS  --  19.6*  HGB 12.1 11.8*  HCT 37.9 37.4  MCV 86.7 87.4  PLT 161 214    RADIOLOGY: Dg Chest Port 1 View Result Date: 09/16/2016 CLINICAL DATA:  Shortness of breath, respiratory failure, hypoxia and COPD exacerbation. Atrial fibrillation with rapid ventricular response. EXAM: PORTABLE CHEST 1 VIEW COMPARISON:  09/15/2016 FINDINGS: The heart size and mediastinal contours  are within normal limits. Stable emphysematous lung disease. There is no evidence of pulmonary edema, consolidation, pneumothorax, nodule or pleural fluid. The visualized skeletal structures are unremarkable. IMPRESSION: Stable emphysema. Electronically Signed   By: Aletta Edouard M.D.   On: 09/16/2016 14:11     ASSESSMENT AND PLAN:   1. PAF w/RVR, tachy-brady Echo EF 60-65%, normal wall motion, trivial pericardial effusion, grade II DD INR today is 2.43     Tikosyn load completed days ago     She is in SR this morning, QTc stable on ECG less than 500 msec     Left pneumothorax resolved post chest tube, no pulled     Pacer site dressing dry, no hematoma        May eventually require AV nodal ablation if returns to AF with difficult to control rates     Adjusted metoprolol dosing to 75 mg BID  2. Respiratory failure Acute on chronic, COPD exacerbation IM noted, not felt to have infectious component     Weaning steroids  3. Diastolic CHF mentioned Not felt to be actively overloaded  4. HTN    stable   5. Leukocytosis     Afebrile, pt denies any symptoms of illness     Lower today     ? 2/2 steroids  6. Pneumothorax:     Occurred during pacemaker procedure. Chest tube pulled without residual PTX  Ashla Murph M. Lenix Benoist MD 09/21/2016 8:25 AM

## 2016-09-21 NOTE — Progress Notes (Signed)
PROGRESS NOTE    Claudia [name]  [id number] DOB: 1933/05/22 DOA: 09/09/2016 PCP: Pcp Not In System    Brief Narrative:  83yo F admitted 1/27 with dyspnea, cough and hypoxia thought related to AECOPD.  Found to be in AF with RVR 1/28 (responded well to sotalol / metoprolol).  Cardiology following for concerns of tachy-brady syndrome.  She had an episode of respiratory distress 1/31 and was transferred to ICU for bipap.  Course complicated by AF with RVR and episodes of bradycardia.    PMHx of paroxysmal AFib on warfarin, COPD, chronic diastolic HF, and HTN  2/5 left pnx post PPM insertion    Assessment & Plan:   Principal Problem:   Acute on chronic respiratory failure (HCC) Active Problems:   COPD with acute exacerbation (HCC)   Tachy-brady syndrome (HCC)   Pneumothorax, traumatic   Long term current use of anticoagulant therapy   Paroxysmal atrial fibrillation (HCC)   HTN (hypertension)   COPD exacerbation (HCC)   Chronic diastolic CHF (congestive heart failure) (HCC)   Hypoxia   Respiratory distress   Chronic atrial fibrillation (HCC)   Acute respiratory failure with hypoxemia (HCC)   Acute delirium   Cardiac pacemaker in situ   S/P chest tube placement (HCC)   Leukocytosis  #1 acute respiratory failure with hypoxia secondary to acute COPD exacerbation Patient with acute respiratory failure with hypoxia thought secondary to acute COPD exacerbation requiring BiPAP. BiPAP currently off with some improvement with respirations. Patient also noted to have a underlying tachybradycardia syndrome that is being managed by cardiology. Continue Xopenex and Atrovent nebulizers 3 times daily pulmonary hygiene, steroid taper. Chest tube has been discontinued by critical care repeat chest x-rays with resolution of pneumothorax. Continue to wean O2. Per PCCM.  #2 tachybradycardia syndrome/A. fib with RVR and SSS status post PPM 09/18/2016 Patient with improvement with heart rate and  currently in normal sinus rhythm. Patient noted to go briefly into atrial fibrillation overnight however currently normal sinus rhythm. Continue Cardizem, Tikosyn, metoprolol for rate control. Patient received a dose of digoxin. Metoprolol dose has been increased to 75 mg twice daily per cardiology. Patient on Coumadin for anticoagulation. INR is therapeutic. Per cardiology.  #3 metabolic alkalosis in the setting of chronic hypercarbia and likely exacerbated by diuretics Continue to monitor. Diuretics on hold. Follow.  #4 acute delirium May be secondary to steroids. Steroids being tapered off per pulmonary. Patient with clinical improvement. Follow.  #5 left pneumothorax post PPM insertion Chest tube has been removed per critical care today. Repeat chest x-ray with resolution of pneumothorax. Per PCCM.  #6 chronic diastolic heart failure Currently euvolemic. Per cardiology.  #7 hypertension Blood pressure borderline. Monitor closely while patient on Cardizem, metoprolol dose increased to 75 mg twice daily per cardiology. Per cardiology.  #8 leukocytosis Questionable etiology. Patient afebrile. Patient denies any diarrhea, no dysuria. Patient does not look septic. Significant jump in white count from 15.5-23.1. Chest x-ray this morning negative for any acute infiltrate. Check blood cultures 2. Check a UA with cultures and sensitivities. No need for antibiotics at this time will follow.  DVT prophylaxis: Coumadin Code Status: DO NOT RESUSCITATE Family Communication: Updated patient and family at bedside. Disposition Plan: Remain the step down unit today. Home versus skilled nursing facility pending PT evaluation and medical stability.   Consultants:   Cardiology: Dr. Debara Pickett 09/12/2016  Electrophysiology Dr.Camintz 09/13/2016  PCCM Dr. Nelda Marseille 09/13/2016  Procedures:  2-D echo 09/13/2016  Pacemaker implantation per Dr. Baird Kay 09/18/2016  Chest x-ray 09/21/2016 ,09/20/2016,  09/19/2016, 09/18/2016, 09/16/2016   SIGNIFICANT EVENTS: 1/27  Admitted with dyspnea and hypoxic respiratory failure 1/28  AFib with RVR, responded to metoprolol, sotalol 1/30  Bradycardia in 30s, then AFib with RVR in 150s 1/31  Transferred to ICU due to respiratory distress, tachy-brady 2/03  Started on CCB gtt 2/04  Rate controlled.  2/5 pnx fromm PPM placed 2/5 2/7 dc chest tube  LINES/TUBES: PIV 1/27 2/5 left chest tube>> 09/20/2016   Antimicrobials:   Oral Levaquin 09/09/2016>>>>> 09/12/2016  IV vancomycin 09/18/2016 1   Subjective: Patient sitting up in chair. Patient denies any chest pain. Patient still with some shortness of breath with no significant change. Patient denies any diarrhea. No dysuria. Patient currently in normal sinus rhythm however briefly went into atrial fibrillation overnight. however looks apprehensive.   Objective: Vitals:   09/21/16 0400 09/21/16 0632 09/21/16 0830 09/21/16 0934  BP: (!) 114/48 (!) 118/46 118/62   Pulse: (!) 59 62 61   Resp: 20  19   Temp:   98.3 F (36.8 C)   TempSrc:   Oral   SpO2: 92%  95% 90%  Weight:      Height:        Intake/Output Summary (Last 24 hours) at 09/21/16 1021 Last data filed at 09/21/16 0923  Gross per 24 hour  Intake              720 ml  Output              400 ml  Net              320 ml   Filed Weights   09/19/16 0500 09/20/16 0400 09/21/16 0327  Weight: 62 kg (136 lb 11 oz) 61.4 kg (135 lb 5.8 oz) 60.6 kg (133 lb 9.6 oz)    Examination:  General exam: Appears calm and comfortable  Respiratory system: Decreased BS in bases. Minimal expiratory wheezing. Fair air movement. Cardiovascular system: S1 & S2 heard, RRR. No JVD, murmurs, rubs, gallops or clicks. No pedal edema. Gastrointestinal system: Abdomen is nondistended, soft and nontender. No organomegaly or masses felt. Normal bowel sounds heard. Central nervous system: Alert and oriented. No focal neurological deficits. Extremities:  Symmetric 5 x 5 power. Skin: No rashes, lesions or ulcers Psychiatry: Judgement and insight appear normal. Mood & affect appropriate.     Data Reviewed: I have personally reviewed following labs and imaging studies  CBC:  Recent Labs Lab 09/16/16 0254 09/18/16 0210 09/19/16 0915 09/20/16 0406 09/21/16 0247  WBC 8.2 12.4* 18.3* 15.5* 23.1*  NEUTROABS  --   --   --   --  19.6*  HGB 12.3 15.1* 13.2 12.1 11.8*  HCT 40.8 46.5* 41.8 37.9 37.4  MCV 92.5 86.8 87.6 86.7 87.4  PLT 153 192 202 161 [id number]   Basic Metabolic Panel:  Recent Labs Lab 09/15/16 0446 09/16/16 0254 09/17/16 0209 09/18/16 0210 09/19/16 0915 09/20/16 0406 09/21/16 0247 09/21/16 0706  NA 141 141 139 138 134* 137  --  135  K 5.3* 4.8 4.3 4.5 4.4 3.9  --  4.4  CL 100* 100* 97* 95* 90* 93*  --  95*  CO2 32 35* 34* 35* 30 33*  --  32  GLUCOSE 128* 129* 112* 119* 113* 111*  --  86  BUN 43* 37* 32* 30* 34* 26*  --  29*  CREATININE 0.91 0.90 0.88 0.92 1.42* 1.19*  --  1.14*  CALCIUM 9.3 9.1 8.7*  8.8* 9.0 8.9  --  8.5*  MG 2.5* 2.3 2.3 2.1  --  2.0 2.0  --   PHOS 3.6 2.4*  --   --   --   --   --   --    GFR: Estimated Creatinine Clearance: 33.2 mL/min (by C-G formula based on SCr of 1.14 mg/dL (H)). Liver Function Tests:  Recent Labs Lab 09/18/16 0210  AST 20  ALT 18  ALKPHOS 69  BILITOT 1.1  PROT 4.8*  ALBUMIN 3.1*   No results for input(s): LIPASE, AMYLASE in the last 168 hours. No results for input(s): AMMONIA in the last 168 hours. Coagulation Profile:  Recent Labs Lab 09/17/16 0209 09/18/16 0210 09/19/16 0915 09/20/16 0406 09/21/16 0247  INR 4.86* 2.44 1.60 2.08 2.43   Cardiac Enzymes: No results for input(s): CKTOTAL, CKMB, CKMBINDEX, TROPONINI in the last 168 hours. BNP (last 3 results) No results for input(s): PROBNP in the last 8760 hours. HbA1C: No results for input(s): HGBA1C in the last 72 hours. CBG:  Recent Labs Lab 09/17/16 0753 09/18/16 0826 09/19/16 0825  09/20/16 0849 09/21/16 0842  GLUCAP 101* 123* 103* 89 105*   Lipid Profile: No results for input(s): CHOL, HDL, LDLCALC, TRIG, CHOLHDL, LDLDIRECT in the last 72 hours. Thyroid Function Tests: No results for input(s): TSH, T4TOTAL, FREET4, T3FREE, THYROIDAB in the last 72 hours. Anemia Panel: No results for input(s): VITAMINB12, FOLATE, FERRITIN, TIBC, IRON, RETICCTPCT in the last 72 hours. Sepsis Labs: No results for input(s): PROCALCITON, LATICACIDVEN in the last 168 hours.  Recent Results (from the past 240 hour(s))  MRSA PCR Screening     Status: None   Collection Time: 09/13/16  1:53 AM  Result Value Ref Range Status   MRSA by PCR NEGATIVE NEGATIVE Final    Comment:        The GeneXpert MRSA Assay (FDA approved for NASAL specimens only), is one component of a comprehensive MRSA colonization surveillance program. It is not intended to diagnose MRSA infection nor to guide or monitor treatment for MRSA infections.   Surgical pcr screen     Status: None   Collection Time: 09/18/16 12:07 PM  Result Value Ref Range Status   MRSA, PCR NEGATIVE NEGATIVE Final   Staphylococcus aureus NEGATIVE NEGATIVE Final    Comment:        The Xpert SA Assay (FDA approved for NASAL specimens in patients over 16 years of age), is one component of a comprehensive surveillance program.  Test performance has been validated by [facility] for patients greater than or equal to 48 year old. It is not intended to diagnose infection nor to guide or monitor treatment.          Radiology Studies: Dg Chest Port 1 View  Result Date: 09/21/2016 CLINICAL DATA:  Pneumothorax continued surveillance. EXAM: PORTABLE CHEST 1 VIEW COMPARISON:  09/20/2016. FINDINGS: Unchanged pacing device with cardiac leads from LEFT subclavian approach. LEFT-sided pneumothorax remains resolved post chest tube removal. No infiltrates or failure. Cardiac enlargement. No acute osseous findings. Cardiac enlargement. No  acute osseous findings. IMPRESSION: No evidence for recurrent pneumothorax. Electronically Signed   By: Staci Righter M.D.   On: 09/21/2016 08:05   Dg Chest Port 1 View  Result Date: 09/20/2016 CLINICAL DATA:  History of pneumothorax, followup EXAM: PORTABLE CHEST 1 VIEW COMPARISON:  Chest x-ray of 09/20/2016 FINDINGS: No active infiltrate or effusion is seen. No pneumothorax is seen. The left chest tube has been removed. Mediastinal and hilar contours  are unremarkable. Cardiomegaly is stable and dual lead permanent pacemaker remains. The bones are osteopenic. There are degenerative changes noted in both shoulders. IMPRESSION: 1. Left chest tube removed.  No pneumothorax. 2. No active lung disease. Stable cardiomegaly with permanent pacemaker. Electronically Signed   By: Ivar Drape M.D.   On: 09/20/2016 15:19   Dg Chest Port 1 View  Result Date: 09/20/2016 CLINICAL DATA:  Recent pneumothorax with chest tube placement EXAM: PORTABLE CHEST 1 VIEW COMPARISON:  September 19, 2016 FINDINGS: Chest tube remains on the left with the chest tube tip slightly more laterally positioned compared to 1 day prior. There is no evident pneumothorax currently. There is mild left base atelectasis. There is also atelectasis in the right upper lobe. Lungs elsewhere clear. Heart size and pulmonary vascularity are normal. Pacemaker leads are attached to the right atrium and right ventricle. There is atherosclerotic calcification in the aorta and right carotid artery. No adenopathy. No bone lesions. IMPRESSION: Chest tube remains on the left without pneumothorax. Mild left base and right upper lobe atelectatic change. Lungs elsewhere clear. Stable cardiac silhouette. There is aortic atherosclerosis. There is also calcification in the right carotid artery. Electronically Signed   By: Lowella Grip III M.D.   On: 09/20/2016 07:12        Scheduled Meds: . aspirin EC  81 mg Oral Daily  . digoxin  0.25 mg Intravenous Once  .  diltiazem  360 mg Oral Daily  . dofetilide  250 mcg Oral BID  . feeding supplement (ENSURE ENLIVE)  237 mL Oral BID BM  . ipratropium-albuterol  3 mL Nebulization Q6H  . mouth rinse  15 mL Mouth Rinse BID  . metoprolol tartrate  75 mg Oral BID  . pantoprazole  40 mg Oral Daily  . polyvinyl alcohol  1 drop Both Eyes TID  . predniSONE  30 mg Oral Q breakfast  . sodium chloride flush  3 mL Intravenous Q12H  . Warfarin - Pharmacist Dosing Inpatient   Does not apply q1800   Continuous Infusions: . diltiazem (CARDIZEM) infusion Stopped (09/18/16 1845)     LOS: 11 days    Time spent: 44 mins    Fiorella Hanahan, MD [facility] Pager [phone]  If 7PM-7AM, please contact night-coverage [email or web address] Password [email or web address] 09/21/2016, 10:21 AM

## 2016-09-21 NOTE — Progress Notes (Signed)
Pt went into AFIB w/ RVR HR 110's-130's. Asymptomatoc. EKG was done. Baltazar Najjar NP notified and ordered Lopressor 5mg  IV Med given and pt converted to SR HR 60's shortly after

## 2016-09-21 NOTE — Progress Notes (Signed)
PULMONARY / CRITICAL CARE MEDICINE   Name: Claudia [name] MRN: [id number] DOB: 04-07-1933    ADMISSION DATE:  09/09/2016 CONSULTATION DATE:  09/13/16  REFERRING MD:  Ree Kida, MD  CHIEF COMPLAINT:  Shortness of breath  BRIEF SUMMARY:  83yo F admitted 1/27 with dyspnea, cough and hypoxia thought related to AECOPD.  Found to be in AF with RVR 1/28 (responded well to sotalol / metoprolol).  Cardiology following for concerns of tachy-brady syndrome.  She had an episode of respiratory distress 1/31 and was transferred to ICU for bipap.  Course complicated by AF with RVR and episodes of bradycardia.    PMHx of paroxysmal AFib on warfarin, COPD, chronic diastolic HF, and HTN  2/5 left pnx post PPM insertion   SUBJECTIVE:  Feeling better.  Leukocytosis wbc 15->23 today. Denies diarrhea, dysuria, cough.   VITAL SIGNS: BP 118/62 (BP Location: Right Arm)   Pulse 61   Temp 98.3 F (36.8 C) (Oral)   Resp 19   Ht 5\' 2"  (1.575 m)   Wt 60.6 kg (133 lb 9.6 oz)   SpO2 90%   BMI 24.44 kg/m  4 liters    INTAKE / OUTPUT: I/O last 3 completed shifts: In: 1080 [P.O.:1080] Out: [id number] [Urine:1160; Chest Tube:125]   General:  Frail, chronically ill appearing female, NAD OOB in chair  HEENT: MM pink/moist Neuro: awake, alert, appropriate, gen weakness  CV: s1s2 rrr, no m/r/g PULM: even/non-labored, lungs bilaterally diminished, rare exp wheeze [id number], non-tender, bsx4 active  Extremities: warm/dry, no edema  Skin: no rashes or lesions   LABS:  BMET  Recent Labs Lab 09/19/16 0915 09/20/16 0406 09/21/16 0706  NA 134* 137 135  K 4.4 3.9 4.4  CL 90* 93* 95*  CO2 30 33* 32  BUN 34* 26* 29*  CREATININE 1.42* 1.19* 1.14*  GLUCOSE 113* 111* 86   Electrolytes  Recent Labs Lab 09/15/16 0446 09/16/16 0254  09/18/16 0210 09/19/16 0915 09/20/16 0406 09/21/16 0247 09/21/16 0706  CALCIUM 9.3 9.1  < > 8.8* 9.0 8.9  --  8.5*  MG 2.5* 2.3  < > 2.1  --  2.0 2.0  --   PHOS 3.6 2.4*  --    --   --   --   --   --   < > = values in this interval not displayed. CBC  Recent Labs Lab 09/19/16 0915 09/20/16 0406 09/21/16 0247  WBC 18.3* 15.5* 23.1*  HGB 13.2 12.1 11.8*  HCT 41.8 37.9 37.4  PLT 202 161 214   Coag's  Recent Labs Lab 09/19/16 0915 09/20/16 0406 09/21/16 0247  INR 1.60 2.08 2.43   Sepsis Markers No results for input(s): LATICACIDVEN, PROCALCITON, O2SATVEN in the last 168 hours.  ABG No results for input(s): PHART, PCO2ART, PO2ART in the last 168 hours. Liver Enzymes  Recent Labs Lab 09/18/16 0210  AST 20  ALT 18  ALKPHOS 69  BILITOT 1.1  ALBUMIN 3.1*   Cardiac Enzymes No results for input(s): TROPONINI, PROBNP in the last 168 hours.   Glucose  Recent Labs Lab 09/16/16 0818 09/17/16 0753 09/18/16 0826 09/19/16 0825 09/20/16 0849 09/21/16 0842  GLUCAP 126* 101* 123* 103* 89 105*   Imaging 2/7 cxr resolved left pnx   STUDIES:  CXR 1/30 >> COPD changes, no acute abnormalities  2/5 cxr with new PPM and lt pnx  CULTURES: RVP 1/28 >> negative BC x 2 2/8>>>  ANTIBIOTICS: None  SIGNIFICANT EVENTS: 1/27  Admitted with dyspnea and hypoxic respiratory  failure 1/28  AFib with RVR, responded to metoprolol, sotalol 1/30  Bradycardia in 30s, then AFib with RVR in 150s 1/31  Transferred to ICU due to respiratory distress, tachy-brady 2/03  Started on CCB gtt 2/04  Rate controlled.  2/5 pnx fromm PPM placed 2/5 2/7 dc chest tube  LINES/TUBES: PIV 1/27 2/5 left chest tube>>2/7   ASSESSMENT / PLAN:  Acute hypoxic and hypercarbic respiratory failure in the setting of AECOPD c/b AFib with RVR   L ptx post PPM insertion 2/5  - chest tube removed 2/7 Plan Supplemental O2 as needed - wean as able for sats >92% Pulmonary hygiene - IS, mobilize, flutter valve  BD's  Wean prednisone - decrease to 20mg  daily 2/9 - further titrate to off over next week  F/u CXR intermittently  Ambulatory desat study prior to d/c  outpt pulmonary  f/u    Af w/ RVR and SSS Hx diastolic HF PPM placed 2/5 with ptx Plan Cardiology following  Continue dig, tikosyn, cardizem  Coumadin per pharmacy    Metabolic alkalosis in setting of chronic hypercarbia and probably exacerbated by diuretics  Plan Trend BMP  Replace electrolytes as indicated    Delirium - improved  Plan  Wean steroids as above  Close monitoring    Leukocytosis - no obvious source infection.  No diarrhea, dysuria, cough, sob.  Afebrile. ?r/t steroids.  Plan -  Wean steroids further as above  Blood cultures, u/a ordered per primary  Monitor fever curve  Recheck CBC in am   PCCM signing off, please call back if needed.    Nickolas Madrid, NP 09/21/2016  10:24 AM Pager: [phone] or [phone]  Attending Note:  83 year old female with PMH above that is DNR who suffered a PTX after placement of a pacer. CT in place and on suction. On exam, BS heard bilaterally. I reviewed CXR myself, no PTX post removal of CT. Discussed with PCCM-NP and TRH-MD. Patient had an acute elevation in WBC overnight however.  PTX: - No longer a need for daily CXR  - RN may remove sutures of chest tube on saturday  Hypoxemia: - Titrate O2 for sat of 88-92%. - Ambulatory desaturation study ordered  Delirium: improving - Prednisone to 30 mg PO daily, titrate over the next week to off - Supportive care. - Promote sleep/wake cycle.  COPD - BD as ordered - Ambulate  Leukocytosis: CXR clear.  Likely a stress response vs steroids, I do not see a sign of infection  - U/A  - BC x2.  - No need for abx at this time.  - If concern for infection may check PCT.  PCCM will sign off, please call back if needed.  Patient seen and examined, agree with above note. I dictated the care and orders written for this patient under my direction.  Rush Farmer,  MD [phone]

## 2016-09-21 NOTE — Progress Notes (Signed)
Verified with Baltazar Najjar NP if she wanted pt to still receive PO metoprolol 37.5mg  since she had received and IV dose around 3a. She asked if BP was over 90. Told her  BP was 105/46 and HR 61, she stated to give med. Will continue to monitor

## 2016-09-22 LAB — URINALYSIS, ROUTINE W REFLEX MICROSCOPIC
Bilirubin Urine: NEGATIVE
GLUCOSE, UA: NEGATIVE mg/dL
Hgb urine dipstick: NEGATIVE
Ketones, ur: NEGATIVE mg/dL
LEUKOCYTES UA: NEGATIVE
Nitrite: NEGATIVE
PROTEIN: NEGATIVE mg/dL
SPECIFIC GRAVITY, URINE: 1.018 (ref 1.005–1.030)
pH: 6 (ref 5.0–8.0)

## 2016-09-22 LAB — BASIC METABOLIC PANEL
Anion gap: 8 (ref 5–15)
BUN: 35 mg/dL — ABNORMAL HIGH (ref 6–20)
CHLORIDE: 93 mmol/L — AB (ref 101–111)
CO2: 33 mmol/L — ABNORMAL HIGH (ref 22–32)
Calcium: 8.7 mg/dL — ABNORMAL LOW (ref 8.9–10.3)
Creatinine, Ser: 1.33 mg/dL — ABNORMAL HIGH (ref 0.44–1.00)
GFR calc Af Amer: 42 mL/min — ABNORMAL LOW (ref >60)
GFR calc non Af Amer: 36 mL/min — ABNORMAL LOW (ref >60)
GLUCOSE: 107 mg/dL — AB (ref 65–99)
Potassium: 5.1 mmol/L (ref 3.5–5.1)
Sodium: 134 mmol/L — ABNORMAL LOW (ref 135–145)

## 2016-09-22 LAB — CBC WITH DIFFERENTIAL/PLATELET
Basophils Absolute: 0 10*3/uL (ref 0.0–0.1)
Basophils Relative: 0 %
EOS PCT: 0 %
Eosinophils Absolute: 0 10*3/uL (ref 0.0–0.7)
HCT: 32.5 % — ABNORMAL LOW (ref 36.0–46.0)
HEMOGLOBIN: 10.2 g/dL — AB (ref 12.0–15.0)
LYMPHS ABS: 0.9 10*3/uL (ref 0.7–4.0)
LYMPHS PCT: 5 %
MCH: 27.6 pg (ref 26.0–34.0)
MCHC: 31.4 g/dL (ref 30.0–36.0)
MCV: 88.1 fL (ref 78.0–100.0)
Monocytes Absolute: 1.4 10*3/uL — ABNORMAL HIGH (ref 0.1–1.0)
Monocytes Relative: 8 %
NEUTROS PCT: 87 %
Neutro Abs: 15.7 10*3/uL — ABNORMAL HIGH (ref 1.7–7.7)
Platelets: 186 10*3/uL (ref 150–400)
RBC: 3.69 MIL/uL — AB (ref 3.87–5.11)
RDW: 14 % (ref 11.5–15.5)
WBC: 18 10*3/uL — AB (ref 4.0–10.5)

## 2016-09-22 LAB — PROTIME-INR
INR: 2.57
Prothrombin Time: 28.1 seconds — ABNORMAL HIGH (ref 11.4–15.2)

## 2016-09-22 LAB — GLUCOSE, CAPILLARY: Glucose-Capillary: 91 mg/dL (ref 65–99)

## 2016-09-22 MED ORDER — WARFARIN SODIUM 2 MG PO TABS
1.0000 mg | ORAL_TABLET | Freq: Once | ORAL | Status: AC
Start: 2016-09-22 — End: 2016-09-22
  Administered 2016-09-22: 1 mg via ORAL
  Filled 2016-09-22: qty 0.5

## 2016-09-22 MED ORDER — NYSTATIN 100000 UNIT/ML MT SUSP
5.0000 mL | Freq: Four times a day (QID) | OROMUCOSAL | Status: DC
  Administered 2016-09-22 – 2016-09-30 (×24): 500000 [IU] via ORAL
  Filled 2016-09-22 (×28): qty 5

## 2016-09-22 MED ORDER — FLUTICASONE PROPIONATE 50 MCG/ACT NA SUSP
2.0000 | Freq: Every day | NASAL | Status: DC
  Administered 2016-09-22 – 2016-09-30 (×9): 2 via NASAL
  Filled 2016-09-22: qty 16

## 2016-09-22 MED ORDER — DOFETILIDE 125 MCG PO CAPS
125.0000 ug | ORAL_CAPSULE | Freq: Two times a day (BID) | ORAL | Status: DC
  Administered 2016-09-22 – 2016-09-24 (×5): 125 ug via ORAL
  Filled 2016-09-22 (×7): qty 1

## 2016-09-22 MED ORDER — LORATADINE 10 MG PO TABS
10.0000 mg | ORAL_TABLET | Freq: Every day | ORAL | Status: DC
  Administered 2016-09-22 – 2016-09-30 (×9): 10 mg via ORAL
  Filled 2016-09-22 (×9): qty 1

## 2016-09-22 MED ORDER — METOPROLOL TARTRATE 100 MG PO TABS
100.0000 mg | ORAL_TABLET | Freq: Two times a day (BID) | ORAL | Status: DC
  Administered 2016-09-22 – 2016-09-24 (×5): 100 mg via ORAL
  Filled 2016-09-22: qty 2
  Filled 2016-09-22: qty 1
  Filled 2016-09-22 (×2): qty 2
  Filled 2016-09-22: qty 1

## 2016-09-22 MED ORDER — FLUCONAZOLE IN SODIUM CHLORIDE 200-0.9 MG/100ML-% IV SOLN
200.0000 mg | Freq: Once | INTRAVENOUS | Status: AC
  Administered 2016-09-22: 200 mg via INTRAVENOUS
  Filled 2016-09-22: qty 100

## 2016-09-22 MED ORDER — FLUCONAZOLE 100 MG PO TABS
100.0000 mg | ORAL_TABLET | Freq: Every day | ORAL | Status: AC
  Administered 2016-09-23 – 2016-09-28 (×6): 100 mg via ORAL
  Filled 2016-09-22 (×6): qty 1

## 2016-09-22 NOTE — Evaluation (Signed)
Physical Therapy Evaluation Patient Details Name: MADA LAZARIN MRN: [id number] DOB: [birth year] Today's Date: 09/22/2016   History of Present Illness  LONELL KRAMM is a 83 y.o. female with PHMx significant for paroxysmal A-fib, COPD with chronic hypoxic respiratory failure, chronic diastolic CHF, and HTN who presents with 2 weeks of dry cough and dyspnea and hypoxic respiratory failure.. 1/31 to ICU to respiratory distress.2/5 PPM with pneumothorax and CT placement--CT removed 2/7.  Clinical Impression  Pt admitted with above diagnosis and presents to PT with functional limitations due to deficits listed below (See PT problem list). Pt needs skilled PT to maximize independence and safety to allow discharge to home with husband and HHPT. Expect pt will make steady progress. As long as husband is well feel pt can return home with him providing needed assist.     Follow Up Recommendations Home health PT;Supervision/Assistance - 24 hour (initially)    Equipment Recommendations  Rolling walker with 5" wheels    Recommendations for Other Services       Precautions / Restrictions Precautions Precautions: ICD/Pacemaker;Fall Precaution Comments: monitor O2; recent pacemaker (2/5) Restrictions Weight Bearing Restrictions: No Other Position/Activity Restrictions: pacer      Mobility  Bed Mobility Overal bed mobility: Needs Assistance Bed Mobility: Sit to Supine       Sit to supine: Min guard      Transfers Overall transfer level: Needs assistance Equipment used: Rolling walker (2 wheeled) Transfers: Sit to/from [employer] Sit to Stand: Min guard;Min assist Stand pivot transfers: Min assist       General transfer comment: Min A to bring hips up from low recliner. Min guard to come up from bsc. Stand pivot from recliner to bsc to bed with min A for balance  Ambulation/Gait Ambulation/Gait assistance: Min guard;Min assist Ambulation Distance (Feet): 100 Feet  (100' x 1, 80' x 1) Assistive device: Rolling walker (2 wheeled) Gait Pattern/deviations: Step-through pattern;Decreased stride length;Staggering left Gait velocity: decr Gait velocity interpretation: Below normal speed for age/gender General Gait Details: min guard for safety except 1 loss of balance when turning corner resulting in a stagger and min A to correct. Amb on 4L of O2 with difficulty obtaining SpO2 when moving. After amb SpO2 88% with return to 96% with rest.  Stairs            Wheelchair Mobility    Modified Rankin (Stroke Patients Only)       Balance Overall balance assessment: Needs assistance Sitting-balance support: No upper extremity supported;Feet supported Sitting balance-Leahy Scale: Good     Standing balance support: Bilateral upper extremity supported;During functional activity Standing balance-Leahy Scale: Poor Standing balance comment: walker and min guard for static standing.                             Pertinent Vitals/Pain Pain Assessment: No/denies pain    Home Living Family/patient expects to be discharged to:: Private residence Living Arrangements: Spouse/significant other Available Help at Discharge: Family;Available 24 hours/day (husband who is recovering from a sickness at present) Type of Home: House Home Access: Stairs to enter Entrance Stairs-Rails: None Entrance Stairs-Number of Steps: 1-1 Home Layout: One level Home Equipment: None      Prior Function Level of Independence: Independent         Comments: including driving     Hand Dominance   Dominant Hand: Right    Extremity/Trunk Assessment   Upper Extremity Assessment Upper Extremity  Assessment: Defer to OT evaluation    Lower Extremity Assessment Lower Extremity Assessment: Generalized weakness       Communication   Communication: No difficulties  Cognition Arousal/Alertness: Awake/alert Behavior During Therapy: WFL for tasks  assessed/performed Overall Cognitive Status: Within Functional Limits for tasks assessed                      General Comments      Exercises     Assessment/Plan    PT Assessment Patient needs continued PT services  PT Problem List Decreased strength;Decreased activity tolerance;Decreased mobility;Decreased balance;Decreased knowledge of use of DME;Cardiopulmonary status limiting activity          PT Treatment Interventions DME instruction;Gait training;Functional mobility training;Therapeutic activities;Therapeutic exercise;Balance training;Patient/family education    PT Goals (Current goals can be found in the Care Plan section)  Acute Rehab PT Goals Patient Stated Goal: to be able to go home and get [facility] therapies PT Goal Formulation: With patient/family Time For Goal Achievement: 09/29/16 Potential to Achieve Goals: Good    Frequency Min 3X/week   Barriers to discharge Decreased caregiver support husband has been sick    Co-evaluation PT/OT/SLP Co-Evaluation/Treatment: Yes Reason for Co-Treatment: For patient/therapist safety PT goals addressed during session: Mobility/safety with mobility;Strengthening/ROM OT goals addressed during session: Strengthening/ROM;ADL's and self-care       End of Session Equipment Utilized During Treatment: Gait belt;Oxygen Activity Tolerance: Patient limited by fatigue Patient left: in bed;with call bell/phone within reach;with bed alarm set;with family/visitor present Nurse Communication: Mobility status         Time: [id number] PT Time Calculation (min) (ACUTE ONLY): 31 min   Charges:   PT Evaluation $PT Eval Moderate Complexity: 1 Procedure     PT G CodesShary Decamp [place] 09-27-2016, 3:37 PM [employer] PT [phone]

## 2016-09-22 NOTE — Progress Notes (Signed)
ANTICOAGULATION CONSULT NOTE - Follow Up Consult  Pharmacy Consult for warfarin Indication: atrial fibrillation  Allergies  Allergen Reactions  . Augmentin [Amoxicillin-Pot Clavulanate] Shortness Of Breath  . Doxycycline Shortness Of Breath  . Nitrofurantoin Shortness Of Breath  . Amiodarone Hcl Nausea And Vomiting  . Clarithromycin Other (See Comments)    Crazy feeling, confusion  . Clonazepam Other (See Comments)    Doesn't remember reaction  . Codeine Nausea And Vomiting  . Diltiazem Hcl Nausea And Vomiting  . Fish Oil Nausea And Vomiting  . Flecainide Nausea Only  . Multaq [Dronedarone] Nausea Only  . Rosuvastatin Other (See Comments)    Leg cramps   . Zetia [Ezetimibe] Other (See Comments)    Leg cramps    Patient Measurements: Height: 5\' 2"  (157.5 cm) Weight: 132 lb 0.9 oz (59.9 kg) IBW/kg (Calculated) : 50.1  Vital Signs: Temp: 97.7 F (36.5 C) (02/09 0843) Temp Source: Oral (02/09 0843) BP: 108/57 (02/09 0900) Pulse Rate: 122 (02/09 0900)  Labs:  Recent Labs  09/20/16 0406 09/21/16 0247 09/21/16 0706 09/22/16 0305  HGB 12.1 11.8*  --  10.2*  HCT 37.9 37.4  --  32.5*  PLT 161 214  --  186  LABPROT 23.7* 26.9*  --  28.1*  INR 2.08 2.43  --  2.57  CREATININE 1.19*  --  1.14* 1.33*    Estimated Creatinine Clearance: 26.2 mL/min (by C-G formula based on SCr of 1.33 mg/dL (H)).  Assessment: 48 YOF admitted for SOB on PTA warfarin for Afib. Pharmacy consulted to dose warfarin. She is also noted on Tikosyn with recent pacer placement on 2/5 and chest tube placement removed 2/8).  -INR= 2.57, Hg with slow trend down  PTA warfarin dose is 2mg  MWF, 1mg  all other days (dose very stable as outpatient per clinic records)  Goal of Therapy:  INR 2-3 Monitor platelets by anticoagulation protocol: Yes   Plan:  -Coumadin 1mg  po today -Daily PT/INR  Hildred Laser, Pharm D 09/22/2016 10:38 AM

## 2016-09-22 NOTE — Evaluation (Signed)
Occupational Therapy Evaluation Patient Details Name: Claudia [name] MRN: [id number] DOB: [birth year] Today's Date: 09/22/2016    History of Present Illness 09/09/2016 ALLIENE ASHWORTH is a 83 y.o. female with PHMx significant for paroxysmal A-fib, COPD with chronic hypoxic respiratory failure, chronic diastolic CHF, and HTN who presents with 2 weeks of dry cough and dyspnea and hypoxic respiratory failure.. 1/31 to ICU to respiratory distress.2/5 PPM with pneumothorax and CT placement--CT removed 2/7.   Clinical Impression   This 83 yo female admitted with above presents to acute OT with deficits below (see OT problem list) thus affecting her PLOF of being totally independent with basic ADLs and IADLs including driving pta. She will benefit from acute OT with follow up [place].    Follow Up Recommendations  Home health OT;Supervision/Assistance - 24 hour    Equipment Recommendations  None recommended by OT       Precautions / Restrictions Precautions Precautions: Fall Precaution Comments: monitor O2; recent pacemaker (2/5)      Mobility Bed Mobility Overal bed mobility: Needs Assistance Bed Mobility: Sit to Supine       Sit to supine: Min guard      Transfers Overall transfer level: Needs assistance Equipment used: Rolling walker (2 wheeled) Transfers: Sit to/from Stand Sit to Stand: Min guard              Balance Overall balance assessment: Needs assistance Sitting-balance support: No upper extremity supported;Feet supported Sitting balance-Leahy Scale: Good     Standing balance support: Bilateral upper extremity supported;During functional activity                                ADL Overall ADL's : Needs assistance/impaired Eating/Feeding: Independent;Sitting   Grooming: Set up;Sitting   Upper Body Bathing: Set up;Sitting   Lower Body Bathing: Min guard;Sit to/from stand   Upper Body Dressing : Set up;Sitting   Lower Body Dressing: Min  guard;Sit to/from stand   Toilet Transfer: Minimal assistance;Ambulation;RW Toilet Transfer Details (indicate cue type and reason): Recliner>around 1/4 unit (with 1 sitting rest break)>bed Toileting- Clothing Manipulation and Hygiene: Minimal assistance (min guard A sit<>stand)         General ADL Comments: Educated on purse lipped breathing. Spoke with pt and dtr about the possible need for a tub seat once pt can get in tub to shower (recent pacemaker)               Pertinent Vitals/Pain Pain Assessment: No/denies pain     Hand Dominance Right   Extremity/Trunk Assessment Upper Extremity Assessment Upper Extremity Assessment: Overall WFL for tasks assessed   Lower Extremity Assessment Lower Extremity Assessment: Defer to PT evaluation       Communication Communication Communication: No difficulties   Cognition Arousal/Alertness: Awake/alert Behavior During Therapy: WFL for tasks assessed/performed Overall Cognitive Status: Within Functional Limits for tasks assessed                                Home Living Family/patient expects to be discharged to:: Private residence Living Arrangements: Spouse/significant other Available Help at Discharge: Family;Available 24 hours/day (husband who is recovering from a sickness at present) Type of Home: House Home Access: Stairs to enter [employer] of Steps: 1-1 Entrance Stairs-Rails: None Home Layout: One level     Bathroom Shower/Tub: Tub/shower unit;Curtain Shower/tub characteristics: Architectural technologist:  (  has both)     Home Equipment: None          Prior Functioning/Environment Level of Independence: Independent        Comments: including driving        OT Problem List: Decreased strength;Impaired balance (sitting and/or standing);Cardiopulmonary status limiting activity   OT Treatment/Interventions: Self-care/ADL training;Therapeutic activities;Patient/family education;DME  and/or AE instruction;Energy conservation;Balance training    OT Goals(Current goals can be found in the care plan section) Acute Rehab OT Goals Patient Stated Goal: to be able to go home and get [facility] therapies OT Goal Formulation: With patient Time For Goal Achievement: 10/06/16 Potential to Achieve Goals: Good  OT Frequency: Min 2X/week           Co-evaluation PT/OT/SLP Co-Evaluation/Treatment: Yes Reason for Co-Treatment: To address functional/ADL transfers;For patient/therapist safety   OT goals addressed during session: Strengthening/ROM;ADL's and self-care      End of Session Equipment Utilized During Treatment: Gait belt;Rolling walker (O2 at 4 liters) Nurse Communication: Mobility status  Activity Tolerance: Patient tolerated treatment well Patient left: in bed;with call bell/phone within reach;with bed alarm set;with family/visitor present   Time: [id number] OT Time Calculation (min): 30 min Charges:  OT General Charges $OT Visit: 1 Procedure OT Evaluation $OT Eval Moderate Complexity: 1 Procedure  Almon Register [id number] 09/22/2016, 1:23 PM

## 2016-09-22 NOTE — Progress Notes (Signed)
SUBJECTIVE: The patient denies chest pain, no active shortness of breath on n/c O2. Chest tube removed without PTX post removal. Back in atrial fibrillation with rates around 100. Patient feeling well without palpitations or SOB. Feels that she is improving.  Marland Kitchen aspirin EC  81 mg Oral Daily  . digoxin  0.25 mg Intravenous Once  . diltiazem  360 mg Oral Daily  . dofetilide  250 mcg Oral BID  . feeding supplement (ENSURE ENLIVE)  237 mL Oral BID BM  . ipratropium-albuterol  3 mL Nebulization Q6H  . mouth rinse  15 mL Mouth Rinse BID  . metoprolol tartrate  75 mg Oral BID  . pantoprazole  40 mg Oral Daily  . polyvinyl alcohol  1 drop Both Eyes TID  . predniSONE  20 mg Oral Q breakfast  . sodium chloride flush  3 mL Intravenous Q12H  . Warfarin - Pharmacist Dosing Inpatient   Does not apply q1800   . diltiazem (CARDIZEM) infusion Stopped (09/18/16 1845)    OBJECTIVE: Physical Exam: Vitals:   09/21/16 2107 09/21/16 2130 09/21/16 2300 09/22/16 0300  BP:  (!) 118/102 105/90 (!) 128/54  Pulse:  66 (!) 58 (!) 59  Resp:   (!) 25 18  Temp:   97.6 F (36.4 C) 98.5 F (36.9 C)  TempSrc:   Oral Oral  SpO2: 93%  98% 97%  Weight:    132 lb 0.9 oz (59.9 kg)  Height:        Intake/Output Summary (Last 24 hours) at 09/22/16 0750 Last data filed at 09/21/16 2000  Gross per 24 hour  Intake              960 ml  Output              100 ml  Net              860 ml    Telemetry is reviewed by myself, atrial fibrillation, rate 100-110  GEN- The patient is well appearing, alert and oriented x 3 today.   Head- normocephalic, atraumatic Eyes-  Sclera clear, conjunctiva pink Ears- hearing intact Oropharynx- clear Neck- supple, no JVP Lungs- CTA, soft exp. wheezes, normal work of breathing, no absent BS Heart- tachycardic, irregular, no significant murmurs, no rubs or gallops GI- soft, NT, ND Extremities- no clubbing, cyanosis, or edema Skin- no rash or lesion Psych- euthymic mood,  full affect Neuro- no gross deficits appreciated  LABS: Basic Metabolic Panel:  Recent Labs  09/20/16 0406 09/21/16 0247 09/21/16 0706 09/22/16 0305  NA 137  --  135 134*  K 3.9  --  4.4 5.1  CL 93*  --  95* 93*  CO2 33*  --  32 33*  GLUCOSE 111*  --  86 107*  BUN 26*  --  29* 35*  CREATININE 1.19*  --  1.14* 1.33*  CALCIUM 8.9  --  8.5* 8.7*  MG 2.0 2.0  --   --    Liver Function Tests: No results for input(s): AST, ALT, ALKPHOS, BILITOT, PROT, ALBUMIN in the last 72 hours. CBC:  Recent Labs  09/21/16 0247 09/22/16 0305  WBC 23.1* 18.0*  NEUTROABS 19.6* 15.7*  HGB 11.8* 10.2*  HCT 37.4 32.5*  MCV 87.4 88.1  PLT 214 186    RADIOLOGY: Dg Chest Port 1 View Result Date: 09/16/2016 CLINICAL DATA:  Shortness of breath, respiratory failure, hypoxia and COPD exacerbation. Atrial fibrillation with rapid ventricular response. EXAM: PORTABLE CHEST 1 VIEW  COMPARISON:  09/15/2016 FINDINGS: The heart size and mediastinal contours are within normal limits. Stable emphysematous lung disease. There is no evidence of pulmonary edema, consolidation, pneumothorax, nodule or pleural fluid. The visualized skeletal structures are unremarkable. IMPRESSION: Stable emphysema. Electronically Signed   By: Aletta Edouard M.D.   On: 09/16/2016 14:11     ASSESSMENT AND PLAN:   1. PAF w/RVR, tachy-brady Echo EF 60-65%, normal wall motion, trivial pericardial effusion, grade II DD INR today is 2.43     Tikosyn load completed days ago     She is in AF this morning, QTc stable on ECG less than 500 msec     Left pneumothorax resolved post chest tube, no pulled     Pacer site dressing dry, no hematoma        May eventually require AV nodal ablation if returns to AF with difficult to control rates     Adjusted metoprolol dosing to 100 mg BID, goal HR at rest 90-110  2. Respiratory failure Acute on chronic, COPD exacerbation IM noted, not felt to have infectious component      Weaning steroids  3. Diastolic CHF mentioned Not felt to be actively overloaded  4. HTN   stable   5. Leukocytosis     Afebrile, pt denies any symptoms of illness     Lower today     ? 2/2 steroids  6. Pneumothorax:     Occurred during pacemaker procedure. Chest tube pulled without residual PTX  Aiyonna Lucado M. Areg Bialas MD 09/22/2016 7:50 AM

## 2016-09-22 NOTE — Progress Notes (Signed)
PROGRESS NOTE    Claudia [name]  [id number] DOB: [birth year] DOA: 09/09/2016 PCP: Pcp Not In System    Brief Narrative:  83yo F admitted 1/27 with dyspnea, cough and hypoxia thought related to AECOPD.  Found to be in AF with RVR 1/28 (responded well to sotalol / metoprolol).  Cardiology following for concerns of tachy-brady syndrome.  She had an episode of respiratory distress 1/31 and was transferred to ICU for bipap.  Course complicated by AF with RVR and episodes of bradycardia.    PMHx of paroxysmal AFib on warfarin, COPD, chronic diastolic HF, and HTN  2/5 left pnx post PPM insertion    Assessment & Plan:   Principal Problem:   Acute on chronic respiratory failure (HCC) Active Problems:   COPD with acute exacerbation (HCC)   Tachy-brady syndrome (HCC)   Pneumothorax, traumatic   Long term current use of anticoagulant therapy   Paroxysmal atrial fibrillation (HCC)   HTN (hypertension)   COPD exacerbation (HCC)   Chronic diastolic CHF (congestive heart failure) (HCC)   Hypoxia   Respiratory distress   Chronic atrial fibrillation (HCC)   Acute respiratory failure with hypoxemia (HCC)   Acute delirium   Cardiac pacemaker in situ   S/P chest tube placement (HCC)   Leukocytosis  #1 acute respiratory failure with hypoxia secondary to acute COPD exacerbation Patient with acute respiratory failure with hypoxia thought secondary to acute COPD exacerbation requiring BiPAP. BiPAP currently off with some improvement with respirations. Patient also noted to have a underlying tachybradycardia syndrome that is being managed by cardiology. Continue Xopenex and Atrovent nebulizers 3 times daily pulmonary hygiene, steroid taper. Chest tube has been discontinued by critical care repeat chest x-rays with resolution of pneumothorax. Continue to wean O2. Per PCCM.  #2 tachybradycardia syndrome/A. fib with RVR and SSS status post PPM 09/18/2016 Patient with improvement with heart rate and  currently in normal sinus rhythm. Patient noted to go briefly into atrial fibrillation early this morning, however currently normal sinus rhythm. Continue Cardizem, Tikosyn, metoprolol for rate control. Patient received a dose of digoxin. Metoprolol dose has been increased to 100 mg twice daily per cardiology. Patient on Coumadin for anticoagulation. INR is therapeutic. Per cardiology patient may eventually require AV nodal ablation if returns to A. fib with difficult to control rates.  #3 metabolic alkalosis in the setting of chronic hypercarbia and likely exacerbated by diuretics Continue to monitor. Diuretics on hold. Follow.  #4 acute delirium May be secondary to steroids. Steroids being tapered off per pulmonary. Patient with clinical improvement. Follow.  #5 left pneumothorax post PPM insertion Chest tube has been removed per critical care today. Repeat chest x-ray with resolution of pneumothorax. Per PCCM.  #6 chronic diastolic heart failure Currently euvolemic. Per cardiology.  #7 hypertension Blood pressure borderline. Monitor closely while patient on Cardizem, metoprolol dose increased to 100 mg twice daily per cardiology. Per cardiology.  #8 leukocytosis Questionable etiology. Patient afebrile. May be secondary to steroids. Patient denies any diarrhea, no dysuria. Patient does not look septic. Significant jump in white count from 15.5-23.1 on 09/21/2016. WBC trending back down.. Chest x-ray  negative for any acute infiltrate. Blood cultures pending. Urinalysis nitrite negative chase leukocytes 0-5 WBCs. Urine cultures pending. Will monitor for now. No need for antibiotics at this time will follow.  DVT prophylaxis: Coumadin Code Status: DO NOT RESUSCITATE Family Communication: Updated patient and family at bedside. Disposition Plan: Remain the step down unit today as patient keeps going in and out  of atrial fibrillation. Home versus skilled nursing facility pending PT evaluation and  medical stability.   Consultants:   Cardiology: Dr. Debara Pickett 09/12/2016  Electrophysiology Dr.Camintz 09/13/2016  PCCM Dr. Nelda Marseille 09/13/2016  Procedures:  2-D echo 09/13/2016  Pacemaker implantation per Dr. Baird Kay 09/18/2016  Chest x-ray 09/21/2016 ,09/20/2016, 09/19/2016, 09/18/2016, 09/16/2016   SIGNIFICANT EVENTS: 1/27  Admitted with dyspnea and hypoxic respiratory failure 1/28  AFib with RVR, responded to metoprolol, sotalol 1/30  Bradycardia in 30s, then AFib with RVR in 150s 1/31  Transferred to ICU due to respiratory distress, tachy-brady 2/03  Started on CCB gtt 2/04  Rate controlled.  2/5 pnx fromm PPM placed 2/5 2/7 dc chest tube  LINES/TUBES: PIV 1/27 2/5 left chest tube>> 09/20/2016   Antimicrobials:   Oral Levaquin 09/09/2016>>>>> 09/12/2016  IV vancomycin 09/18/2016 1   Subjective: Patient sitting up in chair. Patient denies any chest pain. Patient still with some shortness of breath with no significant change. Patient denies any diarrhea. No dysuria. Patient currently in normal sinus rhythm however briefly went into atrial fibrillation early this morning and has converted back. Patient complaining of left nasal congestion.   Objective: Vitals:   09/22/16 0800 09/22/16 0819 09/22/16 0843 09/22/16 0900  BP: 113/64  113/64 (!) 108/57  Pulse: (!) 103  (!) 116 (!) 122  Resp: (!) 21  (!) 21 16  Temp:   97.7 F (36.5 C)   TempSrc:   Oral   SpO2: 94% 94% 96%   Weight:      Height:        Intake/Output Summary (Last 24 hours) at 09/22/16 1106 Last data filed at 09/22/16 0800  Gross per 24 hour  Intake              960 ml  Output                0 ml  Net              960 ml   Filed Weights   09/20/16 0400 09/21/16 0327 09/22/16 0300  Weight: 61.4 kg (135 lb 5.8 oz) 60.6 kg (133 lb 9.6 oz) 59.9 kg (132 lb 0.9 oz)    Examination:  General exam: Appears calm and comfortable  Respiratory system: Decreased BS in bases. Minimal expiratory  wheezing. Fair air movement. Cardiovascular system: S1 & S2 heard, RRR. No JVD, murmurs, rubs, gallops or clicks. No pedal edema. Gastrointestinal system: Abdomen is nondistended, soft and nontender. No organomegaly or masses felt. Normal bowel sounds heard. Central nervous system: Alert and oriented. No focal neurological deficits. Extremities: Symmetric 5 x 5 power. Skin: No rashes, lesions or ulcers Psychiatry: Judgement and insight appear normal. Mood & affect appropriate.     Data Reviewed: I have personally reviewed following labs and imaging studies  CBC:  Recent Labs Lab 09/18/16 0210 09/19/16 0915 09/20/16 0406 09/21/16 0247 09/22/16 0305  WBC 12.4* 18.3* 15.5* 23.1* 18.0*  NEUTROABS  --   --   --  19.6* 15.7*  HGB 15.1* 13.2 12.1 11.8* 10.2*  HCT 46.5* 41.8 37.9 37.4 32.5*  MCV 86.8 87.6 86.7 87.4 88.1  PLT 192 202 161 214 [id number]   Basic Metabolic Panel:  Recent Labs Lab 09/16/16 0254 09/17/16 0209 09/18/16 0210 09/19/16 0915 09/20/16 0406 09/21/16 0247 09/21/16 0706 09/22/16 0305  NA 141 139 138 134* 137  --  135 134*  K 4.8 4.3 4.5 4.4 3.9  --  4.4 5.1  CL 100* 97* 95* 90* 93*  --  95* 93*  CO2 35* 34* 35* 30 33*  --  32 33*  GLUCOSE 129* 112* 119* 113* 111*  --  86 107*  BUN 37* 32* 30* 34* 26*  --  29* 35*  CREATININE 0.90 0.88 0.92 1.42* 1.19*  --  1.14* 1.33*  CALCIUM 9.1 8.7* 8.8* 9.0 8.9  --  8.5* 8.7*  MG 2.3 2.3 2.1  --  2.0 2.0  --   --   PHOS 2.4*  --   --   --   --   --   --   --    GFR: Estimated Creatinine Clearance: 26.2 mL/min (by C-G formula based on SCr of 1.33 mg/dL (H)). Liver Function Tests:  Recent Labs Lab 09/18/16 0210  AST 20  ALT 18  ALKPHOS 69  BILITOT 1.1  PROT 4.8*  ALBUMIN 3.1*   No results for input(s): LIPASE, AMYLASE in the last 168 hours. No results for input(s): AMMONIA in the last 168 hours. Coagulation Profile:  Recent Labs Lab 09/18/16 0210 09/19/16 0915 09/20/16 0406 09/21/16 0247 09/22/16 0305    INR 2.44 1.60 2.08 2.43 2.57   Cardiac Enzymes: No results for input(s): CKTOTAL, CKMB, CKMBINDEX, TROPONINI in the last 168 hours. BNP (last 3 results) No results for input(s): PROBNP in the last 8760 hours. HbA1C: No results for input(s): HGBA1C in the last 72 hours. CBG:  Recent Labs Lab 09/18/16 0826 09/19/16 0825 09/20/16 0849 09/21/16 0842 09/22/16 0845  GLUCAP 123* 103* 89 105* 91   Lipid Profile: No results for input(s): CHOL, HDL, LDLCALC, TRIG, CHOLHDL, LDLDIRECT in the last 72 hours. Thyroid Function Tests: No results for input(s): TSH, T4TOTAL, FREET4, T3FREE, THYROIDAB in the last 72 hours. Anemia Panel: No results for input(s): VITAMINB12, FOLATE, FERRITIN, TIBC, IRON, RETICCTPCT in the last 72 hours. Sepsis Labs: No results for input(s): PROCALCITON, LATICACIDVEN in the last 168 hours.  Recent Results (from the past 240 hour(s))  MRSA PCR Screening     Status: None   Collection Time: 09/13/16  1:53 AM  Result Value Ref Range Status   MRSA by PCR NEGATIVE NEGATIVE Final    Comment:        The GeneXpert MRSA Assay (FDA approved for NASAL specimens only), is one component of a comprehensive MRSA colonization surveillance program. It is not intended to diagnose MRSA infection nor to guide or monitor treatment for MRSA infections.   Surgical pcr screen     Status: None   Collection Time: 09/18/16 12:07 PM  Result Value Ref Range Status   MRSA, PCR NEGATIVE NEGATIVE Final   Staphylococcus aureus NEGATIVE NEGATIVE Final    Comment:        The Xpert SA Assay (FDA approved for NASAL specimens in patients over 17 years of age), is one component of a comprehensive surveillance program.  Test performance has been validated by [facility] for patients greater than or equal to 59 year old. It is not intended to diagnose infection nor to guide or monitor treatment.          Radiology Studies: Dg Chest Port 1 View  Result Date:  09/21/2016 CLINICAL DATA:  Pneumothorax continued surveillance. EXAM: PORTABLE CHEST 1 VIEW COMPARISON:  09/20/2016. FINDINGS: Unchanged pacing device with cardiac leads from LEFT subclavian approach. LEFT-sided pneumothorax remains resolved post chest tube removal. No infiltrates or failure. Cardiac enlargement. No acute osseous findings. Cardiac enlargement. No acute osseous findings. IMPRESSION: No evidence for recurrent pneumothorax. Electronically Signed   By: Jenny Reichmann  Alfonse Flavors M.D.   On: 09/21/2016 08:05   Dg Chest Port 1 View  Result Date: 09/20/2016 CLINICAL DATA:  History of pneumothorax, followup EXAM: PORTABLE CHEST 1 VIEW COMPARISON:  Chest x-ray of 09/20/2016 FINDINGS: No active infiltrate or effusion is seen. No pneumothorax is seen. The left chest tube has been removed. Mediastinal and hilar contours are unremarkable. Cardiomegaly is stable and dual lead permanent pacemaker remains. The bones are osteopenic. There are degenerative changes noted in both shoulders. IMPRESSION: 1. Left chest tube removed.  No pneumothorax. 2. No active lung disease. Stable cardiomegaly with permanent pacemaker. Electronically Signed   By: Ivar Drape M.D.   On: 09/20/2016 15:19        Scheduled Meds: . aspirin EC  81 mg Oral Daily  . digoxin  0.25 mg Intravenous Once  . diltiazem  360 mg Oral Daily  . dofetilide  125 mcg Oral BID  . feeding supplement (ENSURE ENLIVE)  237 mL Oral BID BM  . ipratropium-albuterol  3 mL Nebulization Q6H  . mouth rinse  15 mL Mouth Rinse BID  . metoprolol tartrate  100 mg Oral BID  . pantoprazole  40 mg Oral Daily  . polyvinyl alcohol  1 drop Both Eyes TID  . predniSONE  20 mg Oral Q breakfast  . sodium chloride flush  3 mL Intravenous Q12H  . warfarin  1 mg Oral ONCE-1800  . Warfarin - Pharmacist Dosing Inpatient   Does not apply q1800   Continuous Infusions:    LOS: 12 days    Time spent: 76 mins    Tamber Burtch, MD [facility] Pager [phone]  [phone]  If 7PM-7AM, please contact night-coverage [email or web address] Password [email or web address] 09/22/2016, 11:06 AM

## 2016-09-23 ENCOUNTER — Inpatient Hospital Stay (HOSPITAL_COMMUNITY): Payer: Medicare Other

## 2016-09-23 DIAGNOSIS — B370 Candidal stomatitis: Secondary | ICD-10-CM | POA: Diagnosis not present

## 2016-09-23 DIAGNOSIS — Z7901 Long term (current) use of anticoagulants: Secondary | ICD-10-CM

## 2016-09-23 LAB — CBC WITH DIFFERENTIAL/PLATELET
BASOS ABS: 0 10*3/uL (ref 0.0–0.1)
BASOS PCT: 0 %
EOS PCT: 0 %
Eosinophils Absolute: 0 10*3/uL (ref 0.0–0.7)
HCT: 30.6 % — ABNORMAL LOW (ref 36.0–46.0)
Hemoglobin: 9.6 g/dL — ABNORMAL LOW (ref 12.0–15.0)
Lymphocytes Relative: 5 %
Lymphs Abs: 0.8 10*3/uL (ref 0.7–4.0)
MCH: 27.7 pg (ref 26.0–34.0)
MCHC: 31.4 g/dL (ref 30.0–36.0)
MCV: 88.2 fL (ref 78.0–100.0)
MONO ABS: 1.7 10*3/uL — AB (ref 0.1–1.0)
Monocytes Relative: 11 %
Neutro Abs: 12.1 10*3/uL — ABNORMAL HIGH (ref 1.7–7.7)
Neutrophils Relative %: 84 %
PLATELETS: 188 10*3/uL (ref 150–400)
RBC: 3.47 MIL/uL — ABNORMAL LOW (ref 3.87–5.11)
RDW: 14.1 % (ref 11.5–15.5)
WBC: 14.6 10*3/uL — AB (ref 4.0–10.5)

## 2016-09-23 LAB — URINE CULTURE: CULTURE: NO GROWTH

## 2016-09-23 LAB — BASIC METABOLIC PANEL
Anion gap: 9 (ref 5–15)
BUN: 35 mg/dL — ABNORMAL HIGH (ref 6–20)
CALCIUM: 8.5 mg/dL — AB (ref 8.9–10.3)
CO2: 33 mmol/L — ABNORMAL HIGH (ref 22–32)
Chloride: 92 mmol/L — ABNORMAL LOW (ref 101–111)
Creatinine, Ser: 1.21 mg/dL — ABNORMAL HIGH (ref 0.44–1.00)
GFR, EST AFRICAN AMERICAN: 47 mL/min — AB (ref >60)
GFR, EST NON AFRICAN AMERICAN: 41 mL/min — AB (ref >60)
GLUCOSE: 99 mg/dL (ref 65–99)
Potassium: 4.5 mmol/L (ref 3.5–5.1)
SODIUM: 134 mmol/L — AB (ref 135–145)

## 2016-09-23 LAB — PROTIME-INR
INR: 2.81
PROTHROMBIN TIME: 30.2 s — AB (ref 11.4–15.2)

## 2016-09-23 LAB — MAGNESIUM: MAGNESIUM: 2.1 mg/dL (ref 1.7–2.4)

## 2016-09-23 LAB — GLUCOSE, CAPILLARY: GLUCOSE-CAPILLARY: 90 mg/dL (ref 65–99)

## 2016-09-23 MED ORDER — WARFARIN 0.5 MG HALF TABLET
0.5000 mg | ORAL_TABLET | Freq: Once | ORAL | Status: AC
  Administered 2016-09-23: 0.5 mg via ORAL
  Filled 2016-09-23: qty 1

## 2016-09-23 MED ORDER — IPRATROPIUM-ALBUTEROL 0.5-2.5 (3) MG/3ML IN SOLN
3.0000 mL | Freq: Three times a day (TID) | RESPIRATORY_TRACT | Status: DC
  Administered 2016-09-23 – 2016-09-25 (×7): 3 mL via RESPIRATORY_TRACT
  Filled 2016-09-23 (×7): qty 3

## 2016-09-23 NOTE — Progress Notes (Signed)
Physical Therapy Treatment Patient Details Name: Claudia [name] MRN: [id number] DOB: 1932/10/12 Today's Date: 09/23/2016    History of Present Illness Claudia [name] is a 82 y.o. female with PHMx significant for paroxysmal A-fib, COPD with chronic hypoxic respiratory failure, chronic diastolic CHF, and HTN who presents with 2 weeks of dry cough and dyspnea and hypoxic respiratory failure.. 1/31 to ICU to respiratory distress.2/5 PPM with pneumothorax and CT placement--CT removed 2/7.    PT Comments    Pt admitted with above diagnosis. Pt currently with functional limitations due to balance and endurance deficits. Pt was able to ambulate with RW with no LOB but min guard assist for safety.  Son present and states that someone can assist pt initially upon d/c home.  HH appropriate with RW and 3N1 needed.   Pt will benefit from skilled PT to increase their independence and safety with mobility to allow discharge to the venue listed below.    Follow Up Recommendations  Home health PT;Supervision/Assistance - 24 hour (HHOT)     Equipment Recommendations  Rolling walker with 5" wheels;3in1 (PT)    Recommendations for Other Services       Precautions / Restrictions Precautions Precautions: ICD/Pacemaker;Fall Precaution Comments: monitor O2; recent pacemaker (2/5) Restrictions Weight Bearing Restrictions: No    Mobility  Bed Mobility                  Transfers Overall transfer level: Needs assistance Equipment used: Rolling walker (2 wheeled) Transfers: Sit to/from [facility] Sit to Stand: Min guard;Min assist         General transfer comment: Min guard A to bring hips up from low recliner. min steadying assist once up  Ambulation/Gait Ambulation/Gait assistance: Min guard Ambulation Distance (Feet): 250 Feet (125 x 2) Assistive device: Rolling walker (2 wheeled) Gait Pattern/deviations: Step-through pattern;Decreased stride length Gait velocity:  decr Gait velocity interpretation: Below normal speed for age/gender General Gait Details: min guard for safety without loss of balance.  Amb on 6L of O2 as pt was on 5LO2 on arrival. SpO2 94-98%.  Dr. Grandville Silos came at end of walk and wanted pt to be turned to 3L.  Rest of sesiion at 3LO2 pt sats 94% and >.  Had one sitting rest break.    Stairs            Wheelchair Mobility    Modified Rankin (Stroke Patients Only)       Balance Overall balance assessment: Needs assistance Sitting-balance support: No upper extremity supported;Feet supported Sitting balance-Leahy Scale: Good     Standing balance support: Bilateral upper extremity supported;During functional activity Standing balance-Leahy Scale: Poor Standing balance comment: walker and min guard for static standing.                    Cognition Arousal/Alertness: Awake/alert Behavior During Therapy: WFL for tasks assessed/performed Overall Cognitive Status: Within Functional Limits for tasks assessed                      Exercises      General Comments        Pertinent Vitals/Pain Pain Assessment: No/denies pain  Sats on 5L- 98% on arrival.  Ambulated on 6L with sats >95%.  Dr. Grandville Silos came in at end of session and asked if PT can put pt on 3L to try with pt sats 93% on departure.  Nurse made aware that MD decr O2.  HR 80-120 bpm afib with  ambulation with nurse and MD aware of fluctuation while walking.   Home Living                      Prior Function            PT Goals (current goals can now be found in the care plan section) Acute Rehab PT Goals Patient Stated Goal: to be able to go home and get [name] therapies Progress towards PT goals: Progressing toward goals    Frequency    Min 3X/week      PT Plan Current plan remains appropriate    Co-evaluation             End of Session Equipment Utilized During Treatment: Gait belt;Oxygen Activity Tolerance: Patient  limited by fatigue Patient left: with call bell/phone within reach;with family/visitor present;in chair;with chair alarm set     Time: [id number] PT Time Calculation (min) (ACUTE ONLY): 31 min  Charges:  $Gait Training: 23-37 mins                    G Codes:      [name] 10-11-2016, 12:01 PM [name],PT Acute Rehabilitation [phone] (pager)

## 2016-09-23 NOTE — Progress Notes (Signed)
Progress Note  Patient Name: Claudia [name] Date of Encounter: 09/23/2016  Primary Cardiologist: Dr. Curt Bears  Subjective   Asymptomatic with her atrial fibrillation. No chest pain  Inpatient Medications    Scheduled Meds: . aspirin EC  81 mg Oral Daily  . digoxin  0.25 mg Intravenous Once  . diltiazem  360 mg Oral Daily  . dofetilide  125 mcg Oral BID  . feeding supplement (ENSURE ENLIVE)  237 mL Oral BID BM  . fluconazole  100 mg Oral Daily  . fluticasone  2 spray Each Nare Daily  . ipratropium-albuterol  3 mL Nebulization TID  . loratadine  10 mg Oral Daily  . mouth rinse  15 mL Mouth Rinse BID  . metoprolol tartrate  100 mg Oral BID  . nystatin  5 mL Oral QID  . pantoprazole  40 mg Oral Daily  . polyvinyl alcohol  1 drop Both Eyes TID  . predniSONE  20 mg Oral Q breakfast  . sodium chloride flush  3 mL Intravenous Q12H  . Warfarin - Pharmacist Dosing Inpatient   Does not apply q1800   Continuous Infusions:  PRN Meds: acetaminophen, ALPRAZolam, benzocaine, hydrALAZINE, HYDROcodone-acetaminophen, levalbuterol, ondansetron (ZOFRAN) IV, ondansetron **OR** [DISCONTINUED] ondansetron (ZOFRAN) IV, polyethylene glycol   Vital Signs    Vitals:   09/22/16 2300 09/23/16 0300 09/23/16 0737 09/23/16 0930  BP: (!) 113/57 (!) 124/59    Pulse: 60 61    Resp: (!) 25 (!) 24    Temp: 97.3 F (36.3 C) 97.6 F (36.4 C) 97.4 F (36.3 C)   TempSrc: Oral Oral Oral   SpO2: 97% 100% 95% 96%  Weight:  134 lb 7.7 oz (61 kg)    Height:        Intake/Output Summary (Last 24 hours) at 09/23/16 1111 Last data filed at 09/23/16 0700  Gross per 24 hour  Intake                0 ml  Output              300 ml  Net             -300 ml   Filed Weights   09/21/16 0327 09/22/16 0300 09/23/16 0300  Weight: 133 lb 9.6 oz (60.6 kg) 132 lb 0.9 oz (59.9 kg) 134 lb 7.7 oz (61 kg)    Telemetry    Converted back to sinus rhythm at 10:40 AM on 09/23/16 - Personally Reviewed  ECG    A. fib  QTC 459 on 09/22/16 at 6:40 AM - Personally Reviewed  Physical Exam   GEN: No acute distress.   Neck: No JVD Cardiac: RRR, no murmurs, rubs, or gallops.  Respiratory: Clear to auscultation bilaterally. GI: Soft, nontender, non-distended  MS: No edema; No deformity. Neuro:  Nonfocal  Psych: Normal affect   Labs    Chemistry Recent Labs Lab 09/18/16 0210  09/21/16 0706 09/22/16 0305 09/23/16 0312  NA 138  < > 135 134* 134*  K 4.5  < > 4.4 5.1 4.5  CL 95*  < > 95* 93* 92*  CO2 35*  < > 32 33* 33*  GLUCOSE 119*  < > 86 107* 99  BUN 30*  < > 29* 35* 35*  CREATININE 0.92  < > 1.14* 1.33* 1.21*  CALCIUM 8.8*  < > 8.5* 8.7* 8.5*  PROT 4.8*  --   --   --   --   ALBUMIN 3.1*  --   --   --   --  AST 20  --   --   --   --   ALT 18  --   --   --   --   ALKPHOS 69  --   --   --   --   BILITOT 1.1  --   --   --   --   GFRNONAA 57*  < > 44* 36* 41*  GFRAA >60  < > 51* 42* 47*  ANIONGAP 8  < > 8 8 9   < > = values in this interval not displayed.   Hematology Recent Labs Lab 09/21/16 0247 09/22/16 0305 09/23/16 0312  WBC 23.1* 18.0* 14.6*  RBC 4.28 3.69* 3.47*  HGB 11.8* 10.2* 9.6*  HCT 37.4 32.5* 30.6*  MCV 87.4 88.1 88.2  MCH 27.6 27.6 27.7  MCHC 31.6 31.4 31.4  RDW 14.0 14.0 14.1  PLT 214 186 188    Cardiac EnzymesNo results for input(s): TROPONINI in the last 168 hours. No results for input(s): TROPIPOC in the last 168 hours.   BNPNo results for input(s): BNP, PROBNP in the last 168 hours.   DDimer No results for input(s): DDIMER in the last 168 hours.   Radiology    No results found.  Cardiac Studies   Echocardiogram 09/13/16:  - Left ventricle: The cavity size was normal. Wall thickness was   normal. Systolic function was normal. The estimated ejection   fraction was in the range of 60% to 65%. Wall motion was normal;   there were no regional wall motion abnormalities. Features are   consistent with a pseudonormal left ventricular filling pattern,   with  concomitant abnormal relaxation and increased filling   pressure (grade 2 diastolic dysfunction). - Aortic valve: Moderately to severely calcified annulus.   Moderately thickened, moderately calcified leaflets. - Pericardium, extracardiac: A trivial pericardial effusion was   identified.  Patient Profile     83 y.o. female with paroxysmal atrial fibrillation on Tikosyn, with pacemaker placement, pneumothorax, now normal sinus rhythm  Assessment & Plan    Paroxysmal atrial fibrillation  - Continue with Tikosyn current dose   - QTc 459  - metoprolol 100 bid  -  if atrial fibrillation returns and becomes persistently tachycardic, she may require AV nodal ablation after reviewing Dr. Curt Bears prior notes.   Chronic anticoagulation  - Coumadin  Pneumothorax   - Occurred during pacemaker implantation, chest tube pulled without any residual pneumothorax   Chronic diastolic heart failure   - Stable   COPD exacerbation   - Weaning steroids    Signed, Candee Furbish, MD  09/23/2016, 11:11 AM

## 2016-09-23 NOTE — Progress Notes (Signed)
ANTICOAGULATION CONSULT NOTE - Follow Up Consult  Pharmacy Consult for warfarin Indication: atrial fibrillation  Allergies  Allergen Reactions  . Augmentin [Amoxicillin-Pot Clavulanate] Shortness Of Breath  . Doxycycline Shortness Of Breath  . Nitrofurantoin Shortness Of Breath  . Amiodarone Hcl Nausea And Vomiting  . Clarithromycin Other (See Comments)    Crazy feeling, confusion  . Clonazepam Other (See Comments)    Doesn't remember reaction  . Codeine Nausea And Vomiting  . Diltiazem Hcl Nausea And Vomiting  . Fish Oil Nausea And Vomiting  . Flecainide Nausea Only  . Multaq [Dronedarone] Nausea Only  . Rosuvastatin Other (See Comments)    Leg cramps   . Zetia [Ezetimibe] Other (See Comments)    Leg cramps    Patient Measurements: Height: 5\' 2"  (157.5 cm) Weight: 134 lb 7.7 oz (61 kg) IBW/kg (Calculated) : 50.1  Vital Signs: Temp: 97.1 F (36.2 C) (02/10 1221) Temp Source: Oral (02/10 1221) BP: 124/59 (02/10 0300) Pulse Rate: 61 (02/10 0300)  Labs:  Recent Labs  09/21/16 0247 09/21/16 0706 09/22/16 0305 09/23/16 0312  HGB 11.8*  --  10.2* 9.6*  HCT 37.4  --  32.5* 30.6*  PLT 214  --  186 188  LABPROT 26.9*  --  28.1* 30.2*  INR 2.43  --  2.57 2.81  CREATININE  --  1.14* 1.33* 1.21*    Estimated Creatinine Clearance: 31.4 mL/min (by C-G formula based on SCr of 1.21 mg/dL (H)).  Assessment: 24 YOF admitted for SOB on PTA warfarin for Afib. Pharmacy consulted to dose warfarin. She is noted to be on Tikosyn with recent pacer placement on 2/5 and chest tube placement removed 2/8).  -INR= 2.8 - up trend on low dose with addition of fluconazole - lower dose and watch closely  Hg with slow trend down  PTA warfarin dose is 2mg  MWF, 1mg  all other days (dose very stable as outpatient per clinic records)  Goal of Therapy:  INR 2-3 Monitor platelets by anticoagulation protocol: Yes   Plan:  -Coumadin 0.5 mg po today -Daily PT/INR  Bonnita Nasuti Pharm.D. CPP,  BCPS Clinical Pharmacist [phone] 09/23/2016 12:48 PM

## 2016-09-23 NOTE — Progress Notes (Addendum)
PROGRESS NOTE    Claudia [name]  [id number] DOB: September 04, 1932 DOA: 09/09/2016 PCP: Pcp Not In System    Brief Narrative:  83yo F admitted 1/27 with dyspnea, cough and hypoxia thought related to AECOPD.  Found to be in AF with RVR 1/28 (responded well to sotalol / metoprolol).  Cardiology following for concerns of tachy-brady syndrome.  She had an episode of respiratory distress 1/31 and was transferred to ICU for bipap.  Course complicated by AF with RVR and episodes of bradycardia.    PMHx of paroxysmal AFib on warfarin, COPD, chronic diastolic HF, and HTN  2/5 left pnx post PPM insertion    Assessment & Plan:   Principal Problem:   Acute on chronic respiratory failure (HCC) Active Problems:   COPD with acute exacerbation (HCC)   Tachy-brady syndrome (HCC)   Pneumothorax, traumatic   Long term current use of anticoagulant therapy   Paroxysmal atrial fibrillation (HCC)   HTN (hypertension)   COPD exacerbation (HCC)   Chronic diastolic CHF (congestive heart failure) (HCC)   Hypoxia   Respiratory distress   Chronic atrial fibrillation (HCC)   Acute respiratory failure with hypoxemia (HCC)   Acute delirium   Cardiac pacemaker in situ   S/P chest tube placement (HCC)   Leukocytosis   Oral thrush  #1 acute respiratory failure with hypoxia secondary to acute COPD exacerbation Patient with acute respiratory failure with hypoxia thought secondary to acute COPD exacerbation requiring BiPAP. BiPAP currently off with some improvement with respirations. Patient also noted to have a underlying tachybradycardia syndrome that is being managed by cardiology. Continue Xopenex and Atrovent nebulizers 3 times daily pulmonary hygiene, steroid taper. Chest tube has been discontinued by critical care repeat chest x-rays with resolution of pneumothorax. Due to concern for increased O2 requirements repeat chest x-ray pending. Continue to wean O2 for sats 88-93%. PCCM ff.  #2 tachy- bradycardia  syndrome/A. fib with RVR and SSS status post PPM 09/18/2016 Patient with improvement with heart rate and currently in normal sinus rhythm. Patient noted to go briefly into atrial fibrillation early this morning, however currently normal sinus rhythm. Continue Cardizem, Tikosyn, metoprolol for rate control. Patient received a dose of digoxin. Metoprolol dose has been increased to 100 mg twice daily per cardiology. Patient on Coumadin for anticoagulation. INR is therapeutic. Per cardiology patient may eventually require AV nodal ablation if returns to A. fib with difficult to control rates.  #3 metabolic alkalosis in the setting of chronic hypercarbia and likely exacerbated by diuretics Continue to monitor. Diuretics on hold. Follow.  #4 acute delirium May be secondary to steroids. Steroids being tapered off per pulmonary. Patient with clinical improvement. Follow.  #5 left pneumothorax post PPM insertion Chest tube has been removed per critical care today. Repeat chest x-ray with resolution of pneumothorax. Per PCCM.  #6 ?? chronic diastolic heart failure Currently euvolemic. Patient currently at baseline/dry weight. Patient was not on any diuretics prior to admission per patient and also per recent cardiology note. Per cardiology.  #7 hypertension Blood pressure improving. Monitor closely while patient on Cardizem, metoprolol dose increased to 100 mg twice daily per cardiology. Per cardiology.  #8 leukocytosis Questionable etiology. Patient afebrile. Likely secondary to steroids. Patient denies any diarrhea, no dysuria. Patient does not look septic. Significant jump in white count from 15.5-23.1 on 09/21/2016. WBC trending back down and currently of 14.6.Marland Kitchen Chest x-ray  negative for any acute infiltrate. Blood cultures pending. Urinalysis nitrite negative chase leukocytes 0-5 WBCs. Urine cultures pending.  Will monitor for now. No need for antibiotics at this time will follow.  #9 oral  thrush Continue oral Diflucan and nystatin oral suspension.  DVT prophylaxis: Coumadin Code Status: DO NOT RESUSCITATE Family Communication: Updated patient and family at bedside. Disposition Plan: If heart rate improves today could possibly transfer to telemetry.Home with home health therapies once medically stable, rate controlled, O2 requirements improved.    Consultants:   Cardiology: Dr. Debara Pickett 09/12/2016  Electrophysiology Dr.Camintz 09/13/2016  PCCM Dr. Nelda Marseille 09/13/2016  Procedures:  2-D echo 09/13/2016  Pacemaker implantation per Dr. Baird Kay 09/18/2016  Chest x-ray 09/21/2016 ,09/20/2016, 09/19/2016, 09/18/2016, 09/16/2016   SIGNIFICANT EVENTS: 1/27  Admitted with dyspnea and hypoxic respiratory failure 1/28  AFib with RVR, responded to metoprolol, sotalol 1/30  Bradycardia in 30s, then AFib with RVR in 150s 1/31  Transferred to ICU due to respiratory distress, tachy-brady 2/03  Started on CCB gtt 2/04  Rate controlled.  2/5 pnx fromm PPM placed 2/5 2/7 dc chest tube  LINES/TUBES: PIV 1/27 2/5 left chest tube>> 09/20/2016   Antimicrobials:   Oral Levaquin 09/09/2016>>>>> 09/12/2016  IV vancomycin 09/18/2016 1   Subjective: Patient just ambulating with physical therapy and returning to the room. Vision was placed on 6 L nasal cannula with sats of 96% per physical therapist. Patient also noted to have heart rates in the 120s on ambulation per PT. Patient denies any chest pain. Patient feels shortness of breath is improving. Patient states congestion improving with Claritin and Flonase. Patient also states mouth pain improving.   Objective: Vitals:   09/22/16 2300 09/23/16 0300 09/23/16 0737 09/23/16 0930  BP: (!) 113/57 (!) 124/59    Pulse: 60 61    Resp: (!) 25 (!) 24    Temp: 97.3 F (36.3 C) 97.6 F (36.4 C) 97.4 F (36.3 C)   TempSrc: Oral Oral Oral   SpO2: 97% 100% 95% 96%  Weight:  61 kg (134 lb 7.7 oz)    Height:        Intake/Output  Summary (Last 24 hours) at 09/23/16 1058 Last data filed at 09/23/16 0700  Gross per 24 hour  Intake                0 ml  Output              300 ml  Net             -300 ml   Filed Weights   09/21/16 0327 09/22/16 0300 09/23/16 0300  Weight: 60.6 kg (133 lb 9.6 oz) 59.9 kg (132 lb 0.9 oz) 61 kg (134 lb 7.7 oz)    Examination:  General exam: Appears calm and comfortable  Respiratory system: Decreased BS in bases. Fair air movement. Cardiovascular system: Irregularly irregular. No JVD, murmurs, rubs, gallops or clicks. No pedal edema. Gastrointestinal system: Abdomen is nondistended, soft and nontender. No organomegaly or masses felt. Normal bowel sounds heard. Central nervous system: Alert and oriented. No focal neurological deficits. Extremities: Symmetric 5 x 5 power. Skin: No rashes, lesions or ulcers Psychiatry: Judgement and insight appear normal. Mood & affect appropriate.     Data Reviewed: I have personally reviewed following labs and imaging studies  CBC:  Recent Labs Lab 09/19/16 0915 09/20/16 0406 09/21/16 0247 09/22/16 0305 09/23/16 0312  WBC 18.3* 15.5* 23.1* 18.0* 14.6*  NEUTROABS  --   --  19.6* 15.7* 12.1*  HGB 13.2 12.1 11.8* 10.2* 9.6*  HCT 41.8 37.9 37.4 32.5* 30.6*  MCV 87.6 86.7  87.4 88.1 88.2  PLT 202 161 214 186 0000000   Basic Metabolic Panel:  Recent Labs Lab 09/17/16 0209 09/18/16 0210 09/19/16 0915 09/20/16 0406 09/21/16 0247 09/21/16 0706 09/22/16 0305 09/23/16 0312  NA 139 138 134* 137  --  135 134* 134*  K 4.3 4.5 4.4 3.9  --  4.4 5.1 4.5  CL 97* 95* 90* 93*  --  95* 93* 92*  CO2 34* 35* 30 33*  --  32 33* 33*  GLUCOSE 112* 119* 113* 111*  --  86 107* 99  BUN 32* 30* 34* 26*  --  29* 35* 35*  CREATININE 0.88 0.92 1.42* 1.19*  --  1.14* 1.33* 1.21*  CALCIUM 8.7* 8.8* 9.0 8.9  --  8.5* 8.7* 8.5*  MG 2.3 2.1  --  2.0 2.0  --   --  2.1   GFR: Estimated Creatinine Clearance: 31.4 mL/min (by C-G formula based on SCr of 1.21 mg/dL  (H)). Liver Function Tests:  Recent Labs Lab 09/18/16 0210  AST 20  ALT 18  ALKPHOS 69  BILITOT 1.1  PROT 4.8*  ALBUMIN 3.1*   No results for input(s): LIPASE, AMYLASE in the last 168 hours. No results for input(s): AMMONIA in the last 168 hours. Coagulation Profile:  Recent Labs Lab 09/19/16 0915 09/20/16 0406 09/21/16 0247 09/22/16 0305 09/23/16 0312  INR 1.60 2.08 2.43 2.57 2.81   Cardiac Enzymes: No results for input(s): CKTOTAL, CKMB, CKMBINDEX, TROPONINI in the last 168 hours. BNP (last 3 results) No results for input(s): PROBNP in the last 8760 hours. HbA1C: No results for input(s): HGBA1C in the last 72 hours. CBG:  Recent Labs Lab 09/19/16 0825 09/20/16 0849 09/21/16 0842 09/22/16 0845 09/23/16 0736  GLUCAP 103* 89 105* 91 90   Lipid Profile: No results for input(s): CHOL, HDL, LDLCALC, TRIG, CHOLHDL, LDLDIRECT in the last 72 hours. Thyroid Function Tests: No results for input(s): TSH, T4TOTAL, FREET4, T3FREE, THYROIDAB in the last 72 hours. Anemia Panel: No results for input(s): VITAMINB12, FOLATE, FERRITIN, TIBC, IRON, RETICCTPCT in the last 72 hours. Sepsis Labs: No results for input(s): PROCALCITON, LATICACIDVEN in the last 168 hours.  Recent Results (from the past 240 hour(s))  Surgical pcr screen     Status: None   Collection Time: 09/18/16 12:07 PM  Result Value Ref Range Status   MRSA, PCR NEGATIVE NEGATIVE Final   Staphylococcus aureus NEGATIVE NEGATIVE Final    Comment:        The Xpert SA Assay (FDA approved for NASAL specimens in patients over 48 years of age), is one component of a comprehensive surveillance program.  Test performance has been validated by [facility] for patients greater than or equal to 60 year old. It is not intended to diagnose infection nor to guide or monitor treatment.   Culture, blood (Routine X 2) w Reflex to ID Panel     Status: None (Preliminary result)   Collection Time: 09/21/16  8:45 AM   Result Value Ref Range Status   Specimen Description BLOOD LEFT ANTECUBITAL  Final   Special Requests IN PEDIATRIC BOTTLE 3CC  Final   Culture NO GROWTH 1 DAY  Final   Report Status PENDING  Incomplete  Culture, blood (Routine X 2) w Reflex to ID Panel     Status: None (Preliminary result)   Collection Time: 09/21/16  8:55 AM  Result Value Ref Range Status   Specimen Description BLOOD LEFT HAND  Final   Special Requests IN PEDIATRIC BOTTLE  3CC  Final   Culture NO GROWTH 1 DAY  Final   Report Status PENDING  Incomplete         Radiology Studies: No results found.      Scheduled Meds: . aspirin EC  81 mg Oral Daily  . digoxin  0.25 mg Intravenous Once  . diltiazem  360 mg Oral Daily  . dofetilide  125 mcg Oral BID  . feeding supplement (ENSURE ENLIVE)  237 mL Oral BID BM  . fluconazole  100 mg Oral Daily  . fluticasone  2 spray Each Nare Daily  . ipratropium-albuterol  3 mL Nebulization TID  . loratadine  10 mg Oral Daily  . mouth rinse  15 mL Mouth Rinse BID  . metoprolol tartrate  100 mg Oral BID  . nystatin  5 mL Oral QID  . pantoprazole  40 mg Oral Daily  . polyvinyl alcohol  1 drop Both Eyes TID  . predniSONE  20 mg Oral Q breakfast  . sodium chloride flush  3 mL Intravenous Q12H  . Warfarin - Pharmacist Dosing Inpatient   Does not apply q1800   Continuous Infusions:    LOS: 13 days    Time spent: 58 mins    Karan Inclan, MD [facility] Pager [phone]  If 7PM-7AM, please contact night-coverage [email or web address] Password [email or web address] 09/23/2016, 10:58 AM

## 2016-09-24 LAB — BASIC METABOLIC PANEL
Anion gap: 9 (ref 5–15)
BUN: 33 mg/dL — AB (ref 6–20)
CALCIUM: 9.2 mg/dL (ref 8.9–10.3)
CO2: 31 mmol/L (ref 22–32)
CREATININE: 1.48 mg/dL — AB (ref 0.44–1.00)
Chloride: 96 mmol/L — ABNORMAL LOW (ref 101–111)
GFR calc Af Amer: 37 mL/min — ABNORMAL LOW (ref >60)
GFR, EST NON AFRICAN AMERICAN: 32 mL/min — AB (ref >60)
GLUCOSE: 123 mg/dL — AB (ref 65–99)
Potassium: 4 mmol/L (ref 3.5–5.1)
Sodium: 136 mmol/L (ref 135–145)

## 2016-09-24 LAB — PROTIME-INR
INR: 2.48
Prothrombin Time: 27.3 seconds — ABNORMAL HIGH (ref 11.4–15.2)

## 2016-09-24 MED ORDER — WARFARIN SODIUM 1 MG PO TABS
1.0000 mg | ORAL_TABLET | Freq: Once | ORAL | Status: AC
  Administered 2016-09-24: 1 mg via ORAL
  Filled 2016-09-24: qty 1

## 2016-09-24 MED ORDER — DILTIAZEM HCL ER COATED BEADS 240 MG PO CP24: 240.0000 mg | ORAL_CAPSULE | Freq: Two times a day (BID) | ORAL | Status: DC

## 2016-09-24 NOTE — Progress Notes (Signed)
PROGRESS NOTE    Claudia [name]  [id number] DOB: 04/11/1933 DOA: 09/09/2016 PCP: Pcp Not In System    Brief Narrative:  83yo F admitted 1/27 with dyspnea, cough and hypoxia thought related to AECOPD.  Found to be in AF with RVR 1/28 (responded well to sotalol / metoprolol).  Cardiology following for concerns of tachy-brady syndrome.  She had an episode of respiratory distress 1/31 and was transferred to ICU for bipap.  Course complicated by AF with RVR and episodes of bradycardia.    PMHx of paroxysmal AFib on warfarin, COPD, chronic diastolic HF, and HTN  2/5 left pnx post PPM insertion    Assessment & Plan:   Principal Problem:   Acute on chronic respiratory failure (HCC) Active Problems:   COPD with acute exacerbation (HCC)   Tachy-brady syndrome (HCC)   Pneumothorax, traumatic   Long term current use of anticoagulant therapy   Paroxysmal atrial fibrillation (HCC)   HTN (hypertension)   COPD exacerbation (HCC)   Chronic diastolic CHF (congestive heart failure) (HCC)   Hypoxia   Respiratory distress   Chronic atrial fibrillation (HCC)   Acute respiratory failure with hypoxemia (HCC)   Acute delirium   Cardiac pacemaker in situ   S/P chest tube placement (HCC)   Leukocytosis   Oral thrush  #1 acute respiratory failure with hypoxia secondary to acute COPD exacerbation Patient with acute respiratory failure with hypoxia thought secondary to acute COPD exacerbation requiring BiPAP. BiPAP currently off with some improvement with respirations. Patient also noted to have a underlying tachybradycardia syndrome that is being managed by cardiology. Continue Xopenex and Atrovent nebulizers 3 times daily pulmonary hygiene, steroid taper. Chest tube has been discontinued by critical care repeat chest x-rays with resolution of pneumothorax. Due to concern for increased O2 requirements repeat chest x-ray negative for infiltrate however did have some atelectasis.  Continue to wean O2  for sats 88-93%. PCCM ff.  #2 tachy- bradycardia syndrome/A. fib with RVR and SSS status post PPM 09/18/2016 Patient with improvement with heart rate and currently in normal sinus rhythm. Patient noted to go briefly into atrial fibrillation early yesterday(09/23/2016) morning, however currently normal sinus rhythm. Continue Cardizem, Tikosyn. Metoprolol has been discontinued per cardiology. Patient received a dose of digoxin. Patient on Coumadin for anticoagulation. INR is therapeutic. Per cardiology patient may eventually require AV nodal ablation if returns to A. fib with difficult to control rates.  #3 metabolic alkalosis in the setting of chronic hypercarbia and likely exacerbated by diuretics Continue to monitor. Follow.  #4 acute delirium May be secondary to steroids. Steroids being tapered off per pulmonary. Patient with clinical improvement. Patient likely at baseline. Follow.  #5 left pneumothorax post PPM insertion Chest tube has been removed per critical care today. Repeat chest x-ray with resolution of pneumothorax. Per PCCM.  #6 ?? chronic diastolic heart failure Currently euvolemic. Patient currently at baseline/dry weight. Patient was not on any diuretics prior to admission per patient and also per recent cardiology note. Per cardiology.  #7 hypertension Blood pressure improving. Monitor closely while patient on Cardizem. Metoprolol discontinued per cardiology., Per cardiology.  #8 leukocytosis Questionable etiology. Patient afebrile. Likely secondary to steroids. Patient denies any diarrhea, no dysuria. Patient does not look septic. Significant jump in white count from 15.5-23.1 on 09/21/2016. WBC trending back down and currently of 14.6.Marland Kitchen Chest x-ray  negative for any acute infiltrate. Blood cultures pending. Urinalysis nitrite negative chase leukocytes 0-5 WBCs. Urine cultures negative. Will monitor for now. No need for  antibiotics at this time will follow.  #9 oral  thrush Continue oral Diflucan and nystatin oral suspension.  DVT prophylaxis: Coumadin Code Status: DO NOT RESUSCITATE Family Communication: Updated patient and family at bedside. Disposition Plan: Home with home health therapies once medically stable, rate controlled, O2 requirements improved, heart rate controlled and per cardiology.   Consultants:   Cardiology: Dr. Debara Pickett 09/12/2016  Electrophysiology Dr.Camintz 09/13/2016  PCCM Dr. Nelda Marseille 09/13/2016  Procedures:  2-D echo 09/13/2016  Pacemaker implantation per Dr. Baird Kay 09/18/2016  Chest x-ray 09/21/2016 ,09/20/2016, 09/19/2016, 09/18/2016, 09/16/2016   SIGNIFICANT EVENTS: 1/27  Admitted with dyspnea and hypoxic respiratory failure 1/28  AFib with RVR, responded to metoprolol, sotalol 1/30  Bradycardia in 30s, then AFib with RVR in 150s 1/31  Transferred to ICU due to respiratory distress, tachy-brady 2/03  Started on CCB gtt 2/04  Rate controlled.  2/5 pnx fromm PPM placed 2/5 2/7 dc chest tube  LINES/TUBES: PIV 1/27 2/5 left chest tube>> 09/20/2016   Antimicrobials:   Oral Levaquin 09/09/2016>>>>> 09/12/2016  IV vancomycin 09/18/2016 1   Subjective: Patient  states feels a little bit worn out today. Patient denies any chest pain. Patient states not feeling as well as he did yesterday. Mouth pain improving. Per nursing patient converted into normal sinus rhythm today.  Objective: Vitals:   09/24/16 0406 09/24/16 0747 09/24/16 1452 09/24/16 1500  BP: (!) 109/53  (!) 114/46   Pulse: 87  66   Resp: 18  (!) 26   Temp: 97.8 F (36.6 C)  98.1 F (36.7 C)   TempSrc: Oral  Oral   SpO2: 93% 94% 92% 93%  Weight: 60.1 kg (132 lb 8 oz)     Height:        Intake/Output Summary (Last 24 hours) at 09/24/16 1629 Last data filed at 09/24/16 1621  Gross per 24 hour  Intake              313 ml  Output              975 ml  Net             -662 ml   Filed Weights   09/22/16 0300 09/23/16 0300 09/24/16  0406  Weight: 59.9 kg (132 lb 0.9 oz) 61 kg (134 lb 7.7 oz) 60.1 kg (132 lb 8 oz)    Examination:  General exam: Appears calm and comfortable  Respiratory system: Decreased BS in bases. Fair air movement. Cardiovascular system: RRR. No JVD, murmurs, rubs, gallops or clicks. No pedal edema. Gastrointestinal system: Abdomen is nondistended, soft and nontender. No organomegaly or masses felt. Normal bowel sounds heard. Central nervous system: Alert and oriented. No focal neurological deficits. Extremities: Symmetric 5 x 5 power. Skin: No rashes, lesions or ulcers Psychiatry: Judgement and insight appear normal. Mood & affect appropriate.     Data Reviewed: I have personally reviewed following labs and imaging studies  CBC:  Recent Labs Lab 09/19/16 0915 09/20/16 0406 09/21/16 0247 09/22/16 0305 09/23/16 0312  WBC 18.3* 15.5* 23.1* 18.0* 14.6*  NEUTROABS  --   --  19.6* 15.7* 12.1*  HGB 13.2 12.1 11.8* 10.2* 9.6*  HCT 41.8 37.9 37.4 32.5* 30.6*  MCV 87.6 86.7 87.4 88.1 88.2  PLT 202 161 214 186 0000000   Basic Metabolic Panel:  Recent Labs Lab 09/18/16 0210  09/20/16 0406 09/21/16 0247 09/21/16 0706 09/22/16 0305 09/23/16 0312 09/24/16 1104  NA 138  < > 137  --  135 134* 134* 136  K 4.5  < > 3.9  --  4.4 5.1 4.5 4.0  CL 95*  < > 93*  --  95* 93* 92* 96*  CO2 35*  < > 33*  --  32 33* 33* 31  GLUCOSE 119*  < > 111*  --  86 107* 99 123*  BUN 30*  < > 26*  --  29* 35* 35* 33*  CREATININE 0.92  < > 1.19*  --  1.14* 1.33* 1.21* 1.48*  CALCIUM 8.8*  < > 8.9  --  8.5* 8.7* 8.5* 9.2  MG 2.1  --  2.0 2.0  --   --  2.1  --   < > = values in this interval not displayed. GFR: Estimated Creatinine Clearance: 23.6 mL/min (by C-G formula based on SCr of 1.48 mg/dL (H)). Liver Function Tests:  Recent Labs Lab 09/18/16 0210  AST 20  ALT 18  ALKPHOS 69  BILITOT 1.1  PROT 4.8*  ALBUMIN 3.1*   No results for input(s): LIPASE, AMYLASE in the last 168 hours. No results for  input(s): AMMONIA in the last 168 hours. Coagulation Profile:  Recent Labs Lab 09/20/16 0406 09/21/16 0247 09/22/16 0305 09/23/16 0312 09/24/16 1245  INR 2.08 2.43 2.57 2.81 2.48   Cardiac Enzymes: No results for input(s): CKTOTAL, CKMB, CKMBINDEX, TROPONINI in the last 168 hours. BNP (last 3 results) No results for input(s): PROBNP in the last 8760 hours. HbA1C: No results for input(s): HGBA1C in the last 72 hours. CBG:  Recent Labs Lab 09/19/16 0825 09/20/16 0849 09/21/16 0842 09/22/16 0845 09/23/16 0736  GLUCAP 103* 89 105* 91 90   Lipid Profile: No results for input(s): CHOL, HDL, LDLCALC, TRIG, CHOLHDL, LDLDIRECT in the last 72 hours. Thyroid Function Tests: No results for input(s): TSH, T4TOTAL, FREET4, T3FREE, THYROIDAB in the last 72 hours. Anemia Panel: No results for input(s): VITAMINB12, FOLATE, FERRITIN, TIBC, IRON, RETICCTPCT in the last 72 hours. Sepsis Labs: No results for input(s): PROCALCITON, LATICACIDVEN in the last 168 hours.  Recent Results (from the past 240 hour(s))  Surgical pcr screen     Status: None   Collection Time: 09/18/16 12:07 PM  Result Value Ref Range Status   MRSA, PCR NEGATIVE NEGATIVE Final   Staphylococcus aureus NEGATIVE NEGATIVE Final    Comment:        The Xpert SA Assay (FDA approved for NASAL specimens in patients over 52 years of age), is one component of a comprehensive surveillance program.  Test performance has been validated by [facility] for patients greater than or equal to 38 year old. It is not intended to diagnose infection nor to guide or monitor treatment.   Culture, blood (Routine X 2) w Reflex to ID Panel     Status: None (Preliminary result)   Collection Time: 09/21/16  8:45 AM  Result Value Ref Range Status   Specimen Description BLOOD LEFT ANTECUBITAL  Final   Special Requests IN PEDIATRIC BOTTLE 3CC  Final   Culture NO GROWTH 3 DAYS  Final   Report Status PENDING  Incomplete  Culture, blood  (Routine X 2) w Reflex to ID Panel     Status: None (Preliminary result)   Collection Time: 09/21/16  8:55 AM  Result Value Ref Range Status   Specimen Description BLOOD LEFT HAND  Final   Special Requests IN PEDIATRIC BOTTLE 3CC  Final   Culture NO GROWTH 3 DAYS  Final   Report Status PENDING  Incomplete  Urine culture  Status: None   Collection Time: 09/22/16 12:45 PM  Result Value Ref Range Status   Specimen Description URINE, RANDOM  Final   Special Requests NONE  Final   Culture NO GROWTH  Final   Report Status 09/23/2016 FINAL  Final         Radiology Studies: Dg Chest 2 View  Result Date: 09/23/2016 CLINICAL DATA:  Hypoxia EXAM: CHEST  2 VIEW COMPARISON:  09/21/2016 FINDINGS: Linear subsegmental atelectasis at the right lung base. There is hyperinflation of the lungs compatible with COPD. Left pacer is unchanged. Heart is normal size. No confluent opacity on the left. No effusions. IMPRESSION: Right basilar subsegmental atelectasis. COPD. Electronically Signed   By: Rolm Baptise M.D.   On: 09/23/2016 15:11        Scheduled Meds: . [START ON 09/25/2016] diltiazem  240 mg Oral BID  . dofetilide  125 mcg Oral BID  . feeding supplement (ENSURE ENLIVE)  237 mL Oral BID BM  . fluconazole  100 mg Oral Daily  . fluticasone  2 spray Each Nare Daily  . ipratropium-albuterol  3 mL Nebulization TID  . loratadine  10 mg Oral Daily  . mouth rinse  15 mL Mouth Rinse BID  . nystatin  5 mL Oral QID  . pantoprazole  40 mg Oral Daily  . polyvinyl alcohol  1 drop Both Eyes TID  . predniSONE  20 mg Oral Q breakfast  . sodium chloride flush  3 mL Intravenous Q12H  . warfarin  1 mg Oral ONCE-1800  . Warfarin - Pharmacist Dosing Inpatient   Does not apply q1800   Continuous Infusions:    LOS: 14 days    Time spent: 53 mins    Haja Crego, MD [facility] Pager [phone]  If 7PM-7AM, please contact night-coverage [email or web address] Password [facility] 09/24/2016, 4:29  PM

## 2016-09-24 NOTE — Progress Notes (Signed)
Patient reportedly converted to NSR-SB and is pacing (AV-sequential) almost 100% of the time.  States she "feels better" and has no further feelings of anxiety.  This was relayed to the MD and cardiology made aware.  EKG obtained.

## 2016-09-24 NOTE — Progress Notes (Signed)
ANTICOAGULATION CONSULT NOTE - Follow Up Consult  Pharmacy Consult for warfarin Indication: atrial fibrillation  Allergies  Allergen Reactions  . Augmentin [Amoxicillin-Pot Clavulanate] Shortness Of Breath  . Doxycycline Shortness Of Breath  . Nitrofurantoin Shortness Of Breath  . Amiodarone Hcl Nausea And Vomiting  . Clarithromycin Other (See Comments)    Crazy feeling, confusion  . Clonazepam Other (See Comments)    Doesn't remember reaction  . Codeine Nausea And Vomiting  . Diltiazem Hcl Nausea And Vomiting  . Fish Oil Nausea And Vomiting  . Flecainide Nausea Only  . Multaq [Dronedarone] Nausea Only  . Rosuvastatin Other (See Comments)    Leg cramps   . Zetia [Ezetimibe] Other (See Comments)    Leg cramps    Patient Measurements: Height: 5\' 2"  (157.5 cm) Weight: 132 lb 8 oz (60.1 kg) IBW/kg (Calculated) : 50.1  Vital Signs: Temp: 98.1 F (36.7 C) (02/11 1452) Temp Source: Oral (02/11 1452) BP: 114/46 (02/11 1452) Pulse Rate: 66 (02/11 1452)  Labs:  Recent Labs  09/22/16 0305 09/23/16 0312 09/24/16 1104 09/24/16 1245  HGB 10.2* 9.6*  --   --   HCT 32.5* 30.6*  --   --   PLT 186 188  --   --   LABPROT 28.1* 30.2*  --  27.3*  INR 2.57 2.81  --  2.48  CREATININE 1.33* 1.21* 1.48*  --     Estimated Creatinine Clearance: 23.6 mL/min (by C-G formula based on SCr of 1.48 mg/dL (H)).  Assessment: 89 YOF admitted for SOB on PTA warfarin for Afib. Pharmacy consulted to dose warfarin. She is noted to be on Tikosyn with recent pacer placement on 2/5 and chest tube placement removed 2/8).   INR therapeutic today at 2.48, no CBC today and no notes of bleeding. Received reduced warfarin dose yesterday with addition of fluconazole.    PTA warfarin dose is 2mg  MWF, 1mg  all other days (dose very stable as outpatient per clinic records)  Goal of Therapy:  INR 2-3 Monitor platelets by anticoagulation protocol: Yes   Plan:  -Warfarin 1 mg po today -Daily  PT/INR -Monitor for drug interactions and signs/symptoms of bleeding  Demetrius Charity, PharmD Acute Care Pharmacy Resident  Pager: [phone] 09/24/2016

## 2016-09-24 NOTE — Progress Notes (Signed)
Per [employer] check for [employer] [facility] @ [employer] RX # [phone]   TIKOSYN  500 MCG  NONE FORMULARY  PRIOR APPROVAL YES # [phone] OPT- 5   PREFERRED :  DOFETILIDE 500 MCG  COVER- YES  CO-PAY- 45 % OF COAST  TIER- 4 DRUG  PRIOR APPROVAL- NO  PHARMACY : [facility]

## 2016-09-24 NOTE — Care Management Note (Signed)
Case Management Note Claudia Gibbons RN, BSN Unit 2W-Case Manager [phone]  Patient Details  Name: [name] MRN: [id number] Date of Birth: July 03, 1933  Subjective/Objective:   Adm w copd, arrthymia                 Action/Plan: lives w fam, [name]  Expected Discharge Date:                  Expected Discharge Plan:  [place]  In-House Referral:     Discharge planning Services  CM Consult  Post Acute Care Choice:  Durable Medical Equipment, Home Health Choice offered to:  Patient  DME Arranged:  Nebulizer/meds, Nebulizer machine, Walker rolling DME Agency:  Adult and Armed forces logistics/support/administrative officer, [place]:  RN, PT, OT, Nurse's Aide, Social Work [employer] Agency:  [place]  Status of Service:  In process, will continue to follow  If discussed at Long Length of Stay Meetings, dates discussed:    Additional Comments:  09/24/16- 1330- Claudia Gibbons RN,, CM- orders placed for HH-RN/PT/OT/aide/SW- spoke with pt at bedside- choice offered for [facility] services in [facility]- pt states no preference for agency- ok with [facility]- referral called to [facility] [place]- pt will also need home nebulizer and RW for home at time of discharge- has home 02 with Adult and Pediatric services- pt has been started on Tikosyn- has been informed of her copay- will need 7 day supply from [place] on discharge and script for refills sent to her pharmacy to fill.   Lacretia Leigh, RN 09/19/2016, 10:50 AM--pt on Phyllis Ginger. thas 45% copay and needs prior auth. Placed pt assist form on shadow chart. Have spoken w pt and da to alert them of this also. Da will ck w pt's drugstore about exact copay.  Dahlia Client [place], RN 09/24/2016, 1:37 PM [phone]

## 2016-09-24 NOTE — Progress Notes (Signed)
CCMD called to report pt converted back to A. Fib at 2232. EKG confirmed. HR is the high 90's to 110's. FYI page sent to on-call cardiologist. Will continue to monitor.

## 2016-09-24 NOTE — Progress Notes (Addendum)
Progress Note  Patient Name: Claudia [name] Date of Encounter: 09/24/2016  Primary Cardiologist: Dr. Curt Bears  Subjective   Asymptomatic with her atrial fibrillation. No chest pain She is however quite short of breath and does not feel as good as she did yesterday.  Inpatient Medications    Scheduled Meds: . aspirin EC  81 mg Oral Daily  . diltiazem  360 mg Oral Daily  . dofetilide  125 mcg Oral BID  . feeding supplement (ENSURE ENLIVE)  237 mL Oral BID BM  . fluconazole  100 mg Oral Daily  . fluticasone  2 spray Each Nare Daily  . ipratropium-albuterol  3 mL Nebulization TID  . loratadine  10 mg Oral Daily  . mouth rinse  15 mL Mouth Rinse BID  . metoprolol tartrate  100 mg Oral BID  . nystatin  5 mL Oral QID  . pantoprazole  40 mg Oral Daily  . polyvinyl alcohol  1 drop Both Eyes TID  . predniSONE  20 mg Oral Q breakfast  . sodium chloride flush  3 mL Intravenous Q12H  . Warfarin - Pharmacist Dosing Inpatient   Does not apply q1800   Continuous Infusions:  PRN Meds: acetaminophen, ALPRAZolam, benzocaine, hydrALAZINE, HYDROcodone-acetaminophen, levalbuterol, ondansetron (ZOFRAN) IV, ondansetron **OR** [DISCONTINUED] ondansetron (ZOFRAN) IV, polyethylene glycol   Vital Signs    Vitals:   09/23/16 2126 09/23/16 2129 09/24/16 0406 09/24/16 0747  BP: 123/60  (!) 109/53   Pulse: (!) 109 85 87   Resp: 17  18   Temp: 97.7 F (36.5 C)  97.8 F (36.6 C)   TempSrc: Oral  Oral   SpO2: 94%  93% 94%  Weight:   132 lb 8 oz (60.1 kg)   Height:        Intake/Output Summary (Last 24 hours) at 09/24/16 1228 Last data filed at 09/24/16 0315  Gross per 24 hour  Intake               63 ml  Output              475 ml  Net             -412 ml   Filed Weights   09/22/16 0300 09/23/16 0300 09/24/16 0406  Weight: 132 lb 0.9 oz (59.9 kg) 134 lb 7.7 oz (61 kg) 132 lb 8 oz (60.1 kg)    Telemetry     Recurrent brief episodes of atrial fibrillation 8 - Personally  Reviewed  ECG    A. fib QTC 459 on 09/22/16 at 6:40 AM - Personally Reviewed  Physical Exam   GEN: Mild respiratory distress wearing oxygen at 4 L   Neck: No JVD Cardiac: RRR, no murmurs, rubs, or gallops.  Respiratory:  decreased breath sounds and prolonged expiratory phase GI: Soft, nontender, non-distended  MS: No edema; No deformity. Neuro:  Nonfocal  Psych: Normal affect   Labs    Chemistry Recent Labs Lab 09/18/16 0210  09/21/16 0706 09/22/16 0305 09/23/16 0312  NA 138  < > 135 134* 134*  K 4.5  < > 4.4 5.1 4.5  CL 95*  < > 95* 93* 92*  CO2 35*  < > 32 33* 33*  GLUCOSE 119*  < > 86 107* 99  BUN 30*  < > 29* 35* 35*  CREATININE 0.92  < > 1.14* 1.33* 1.21*  CALCIUM 8.8*  < > 8.5* 8.7* 8.5*  PROT 4.8*  --   --   --   --  ALBUMIN 3.1*  --   --   --   --   AST 20  --   --   --   --   ALT 18  --   --   --   --   ALKPHOS 69  --   --   --   --   BILITOT 1.1  --   --   --   --   GFRNONAA 57*  < > 44* 36* 41*  GFRAA >60  < > 51* 42* 47*  ANIONGAP 8  < > 8 8 9   < > = values in this interval not displayed.   Hematology  Recent Labs Lab 09/21/16 0247 09/22/16 0305 09/23/16 0312  WBC 23.1* 18.0* 14.6*  RBC 4.28 3.69* 3.47*  HGB 11.8* 10.2* 9.6*  HCT 37.4 32.5* 30.6*  MCV 87.4 88.1 88.2  MCH 27.6 27.6 27.7  MCHC 31.6 31.4 31.4  RDW 14.0 14.0 14.1  PLT 214 186 188    Cardiac EnzymesNo results for input(s): TROPONINI in the last 168 hours. No results for input(s): TROPIPOC in the last 168 hours.   BNPNo results for input(s): BNP, PROBNP in the last 168 hours.   DDimer No results for input(s): DDIMER in the last 168 hours.   Radiology    Dg Chest 2 View  Result Date: 09/23/2016 CLINICAL DATA:  Hypoxia EXAM: CHEST  2 VIEW COMPARISON:  09/21/2016 FINDINGS: Linear subsegmental atelectasis at the right lung base. There is hyperinflation of the lungs compatible with COPD. Left pacer is unchanged. Heart is normal size. No confluent opacity on the left. No  effusions. IMPRESSION: Right basilar subsegmental atelectasis. COPD. Electronically Signed   By: Rolm Baptise M.D.   On: 09/23/2016 15:11    Cardiac Studies   Echocardiogram 09/13/16:  - Left ventricle: The cavity size was normal. Wall thickness was   normal. Systolic function was normal. The estimated ejection   fraction was in the range of 60% to 65%. Wall motion was normal;   there were no regional wall motion abnormalities. Features are   consistent with a pseudonormal left ventricular filling pattern,   with concomitant abnormal relaxation and increased filling   pressure (grade 2 diastolic dysfunction). - Aortic valve: Moderately to severely calcified annulus.   Moderately thickened, moderately calcified leaflets. - Pericardium, extracardiac: A trivial pericardial effusion was   identified.  Patient Profile     83 y.o. female with paroxysmal atrial fibrillation on Tikosyn, with pacemaker placement, pneumothorax, now normal sinus rhythm  Assessment & Plan    Paroxysmal atrial fibrillation  - Continue with Tikosyn current dose   - QTc 459  - metoprolol 100 bid -This is a new drug for her previously having been on low-dose sotalol. I wonder if this is contributing to her dyspnea and her wheezing. We will decrease it. Hopefully the diltiazem will be sufficient to keep her from excessive rates. Even though she continues to have some atrial fibrillation hopefully that burden will not be to great.   Discontinue metoprolol  Chronic anticoagulation  - Coumadin  Pneumothorax   - Occurred during pacemaker implantation, chest tube pulled without any residual pneumothorax   Chronic diastolic heart failure   - Stable   COPD exacerbation   - Weaning steroids I don't see an indication for aspirin in addition to anticoagulation; I will take the liberty of stopping it and send message to Dr. Thermon Leyland   Signed, Virl Axe, MD  09/24/2016, 12:28 PM

## 2016-09-25 LAB — BASIC METABOLIC PANEL
ANION GAP: 12 (ref 5–15)
BUN: 28 mg/dL — ABNORMAL HIGH (ref 6–20)
CHLORIDE: 97 mmol/L — AB (ref 101–111)
CO2: 28 mmol/L (ref 22–32)
CREATININE: 1.18 mg/dL — AB (ref 0.44–1.00)
Calcium: 8.8 mg/dL — ABNORMAL LOW (ref 8.9–10.3)
GFR calc non Af Amer: 42 mL/min — ABNORMAL LOW (ref >60)
GFR, EST AFRICAN AMERICAN: 49 mL/min — AB (ref >60)
Glucose, Bld: 94 mg/dL (ref 65–99)
POTASSIUM: 3.9 mmol/L (ref 3.5–5.1)
SODIUM: 137 mmol/L (ref 135–145)

## 2016-09-25 LAB — PROTIME-INR
INR: 2.74
PROTHROMBIN TIME: 29.6 s — AB (ref 11.4–15.2)

## 2016-09-25 LAB — CBC
HEMATOCRIT: 33.1 % — AB (ref 36.0–46.0)
HEMOGLOBIN: 10.4 g/dL — AB (ref 12.0–15.0)
MCH: 27.9 pg (ref 26.0–34.0)
MCHC: 31.4 g/dL (ref 30.0–36.0)
MCV: 88.7 fL (ref 78.0–100.0)
Platelets: 267 10*3/uL (ref 150–400)
RBC: 3.73 MIL/uL — AB (ref 3.87–5.11)
RDW: 14.3 % (ref 11.5–15.5)
WBC: 16.8 10*3/uL — ABNORMAL HIGH (ref 4.0–10.5)

## 2016-09-25 LAB — MAGNESIUM: MAGNESIUM: 1.9 mg/dL (ref 1.7–2.4)

## 2016-09-25 MED ORDER — LEVALBUTEROL HCL 0.63 MG/3ML IN NEBU
0.6300 mg | INHALATION_SOLUTION | Freq: Three times a day (TID) | RESPIRATORY_TRACT | Status: DC
  Administered 2016-09-25 (×2): 0.63 mg via RESPIRATORY_TRACT
  Filled 2016-09-25 (×2): qty 3

## 2016-09-25 MED ORDER — DILTIAZEM HCL ER COATED BEADS 180 MG PO CP24
300.0000 mg | ORAL_CAPSULE | Freq: Two times a day (BID) | ORAL | Status: DC
  Administered 2016-09-25 – 2016-09-30 (×11): 300 mg via ORAL
  Filled 2016-09-25 (×12): qty 1

## 2016-09-25 MED ORDER — WARFARIN SODIUM 1 MG PO TABS
0.5000 mg | ORAL_TABLET | Freq: Once | ORAL | Status: AC
  Administered 2016-09-25: 0.5 mg via ORAL
  Filled 2016-09-25: qty 1

## 2016-09-25 MED ORDER — IPRATROPIUM BROMIDE 0.02 % IN SOLN
0.5000 mg | Freq: Three times a day (TID) | RESPIRATORY_TRACT | Status: DC
  Administered 2016-09-25 (×2): 0.5 mg via RESPIRATORY_TRACT
  Filled 2016-09-25 (×2): qty 2.5

## 2016-09-25 MED ORDER — BUDESONIDE 0.25 MG/2ML IN SUSP
0.2500 mg | Freq: Two times a day (BID) | RESPIRATORY_TRACT | Status: DC
  Administered 2016-09-25 – 2016-09-30 (×10): 0.25 mg via RESPIRATORY_TRACT
  Filled 2016-09-25 (×10): qty 2

## 2016-09-25 MED ORDER — LEVALBUTEROL HCL 0.63 MG/3ML IN NEBU
0.6300 mg | INHALATION_SOLUTION | Freq: Three times a day (TID) | RESPIRATORY_TRACT | Status: DC
  Administered 2016-09-26 – 2016-09-30 (×13): 0.63 mg via RESPIRATORY_TRACT
  Filled 2016-09-25 (×13): qty 3

## 2016-09-25 MED ORDER — IPRATROPIUM BROMIDE 0.02 % IN SOLN: 0.5000 mg | RESPIRATORY_TRACT | Status: DC | PRN

## 2016-09-25 MED ORDER — LEVALBUTEROL HCL 0.63 MG/3ML IN NEBU: 0.6300 mg | INHALATION_SOLUTION | RESPIRATORY_TRACT | Status: DC | PRN

## 2016-09-25 MED ORDER — PREDNISONE 10 MG PO TABS
10.0000 mg | ORAL_TABLET | Freq: Every day | ORAL | Status: DC
  Administered 2016-09-26: 10 mg via ORAL
  Filled 2016-09-25: qty 1

## 2016-09-25 MED ORDER — ARFORMOTEROL TARTRATE 15 MCG/2ML IN NEBU
15.0000 ug | INHALATION_SOLUTION | Freq: Two times a day (BID) | RESPIRATORY_TRACT | Status: DC
  Administered 2016-09-26 – 2016-09-30 (×8): 15 ug via RESPIRATORY_TRACT
  Filled 2016-09-25 (×8): qty 2

## 2016-09-25 MED ORDER — IPRATROPIUM BROMIDE 0.02 % IN SOLN
0.5000 mg | Freq: Three times a day (TID) | RESPIRATORY_TRACT | Status: DC
  Administered 2016-09-26 – 2016-09-30 (×13): 0.5 mg via RESPIRATORY_TRACT
  Filled 2016-09-25 (×13): qty 2.5

## 2016-09-25 NOTE — Care Management Important Message (Signed)
Important Message  Patient Details  Name: Claudia [name] MRN: [id number] Date of Birth: [birth year]   Medicare Important Message Given:  Yes    Orbie Pyo 09/25/2016, 4:22 PM

## 2016-09-25 NOTE — Care Management Note (Signed)
Case Management Note  Patient Details  Name: Claudia [name] MRN: [id number] Date of Birth: 1933-04-15  Subjective/Objective:                    Action/Plan: Plan remains home with [facility] services one pt medically ready. Pts being switched from Tikosyn to Sotalol after washout period. CM following.  Expected Discharge Date:                  Expected Discharge Plan:  [place]  In-House Referral:     Discharge planning Services  CM Consult  Post Acute Care Choice:  Durable Medical Equipment, Home Health Choice offered to:  Patient  DME Arranged:  Nebulizer/meds, Nebulizer machine, Walker rolling DME Agency:  Adult and Armed forces logistics/support/administrative officer, [place] Arranged:  RN, PT, OT, Nurse's Aide, Social Work [employer] Agency:  [place]  Status of Service:  In process, will continue to follow  If discussed at Long Length of Stay Meetings, dates discussed:    Additional Comments:  Pollie Friar, RN 09/25/2016, 6:59 PM

## 2016-09-25 NOTE — Progress Notes (Signed)
PROGRESS NOTE    Claudia [name]  [id number] DOB: 1932/12/10 DOA: 09/09/2016 PCP: Pcp Not In System    Brief Narrative:  83yo F admitted 1/27 with dyspnea, cough and hypoxia thought related to AECOPD.  Found to be in AF with RVR 1/28 (responded well to sotalol / metoprolol).  Cardiology following for concerns of tachy-brady syndrome.  She had an episode of respiratory distress 1/31 and was transferred to ICU for bipap.  Course complicated by AF with RVR and episodes of bradycardia.    PMHx of paroxysmal AFib on warfarin, COPD, chronic diastolic HF, and HTN  2/5 left pnx post PPM insertion    Assessment & Plan:   Principal Problem:   Acute on chronic respiratory failure (HCC) Active Problems:   COPD with acute exacerbation (HCC)   Tachy-brady syndrome (HCC)   Pneumothorax, traumatic   Long term current use of anticoagulant therapy   Paroxysmal atrial fibrillation (HCC)   HTN (hypertension)   COPD exacerbation (HCC)   Chronic diastolic CHF (congestive heart failure) (HCC)   Hypoxia   Respiratory distress   Chronic atrial fibrillation (HCC)   Acute respiratory failure with hypoxemia (HCC)   Acute delirium   Cardiac pacemaker in situ   S/P chest tube placement (HCC)   Leukocytosis   Oral thrush  #1 acute respiratory failure with hypoxia secondary to acute COPD exacerbation Patient with acute respiratory failure with hypoxia thought secondary to acute COPD exacerbation requiring BiPAP. BiPAP currently off with some improvement with respirations. Patient also noted to have a underlying tachybradycardia syndrome that is being managed by cardiology. Continue Xopenex and Atrovent nebulizers 3 times daily pulmonary hygiene, steroid taper. Chest tube has been discontinued by critical care repeat chest x-rays with resolution of pneumothorax. Due to concern for increased O2 requirements repeat chest x-ray negative for infiltrate however did have some atelectasis.  Continue to wean O2  for sats 88-93%. PCCM ff.  #2 tachy- bradycardia syndrome/A. fib with RVR and SSS status post PPM 09/18/2016 Patient with improvement with heart rate and currently back in atrial fibrillation overnight, patient also with some complaints of shortness of breath. Patient noted to go briefly into atrial fibrillation early yesterday(09/23/2016) morning, however currently normal sinus rhythm. Continue Cardizem. Metoprolol and Tikosyn has been discontinued per cardiology. Per cardiology patient to be started on sotalol once Tikosyn has washed out her system. Patient received a dose of digoxin. Patient on Coumadin for anticoagulation. INR is therapeutic. Per cardiology patient may eventually require AV nodal ablation if returns to A. fib with difficult to control rates.  #3 metabolic alkalosis in the setting of chronic hypercarbia and likely exacerbated by diuretics Continue to monitor. Follow.  #4 acute delirium May be secondary to steroids. Steroids being tapered off per pulmonary. Patient with clinical improvement. Patient likely at baseline. Follow.  #5 left pneumothorax post PPM insertion Chest tube has been removed per critical care today. Repeat chest x-ray with resolution of pneumothorax. Per PCCM.  #6 ?? chronic diastolic heart failure Currently euvolemic. Patient currently at baseline/dry weight. Patient was not on any diuretics prior to admission per patient and also per recent cardiology note. Per cardiology.  #7 hypertension Blood pressure improving. Monitor closely while patient on Cardizem. Metoprolol discontinued per cardiology., Per cardiology.  #8 leukocytosis Questionable etiology. Patient afebrile. Likely secondary to steroids. Patient denies any diarrhea, no dysuria. Patient does not look septic. Significant jump in white count from 15.5-23.1 on 09/21/2016. WBC trending back down and currently of 16.8.Marland Kitchen Chest x-ray  negative for any acute infiltrate. Blood cultures pending.  Urinalysis nitrite negative chase leukocytes 0-5 WBCs. Urine cultures negative. Will monitor for now. No need for antibiotics at this time will follow.  #9 oral thrush Continue oral Diflucan and nystatin oral suspension.  DVT prophylaxis: Coumadin Code Status: DO NOT RESUSCITATE Family Communication: Updated patient. No family at bedside. Disposition Plan: Home with home health therapies once medically stable, rate controlled, O2 requirements improved, heart rate controlled and per cardiology.   Consultants:   Cardiology: Dr. Debara Pickett 09/12/2016  Electrophysiology Dr.Camintz 09/13/2016  PCCM Dr. Nelda Marseille 09/13/2016  Procedures:  2-D echo 09/13/2016  Pacemaker implantation per Dr. Baird Kay 09/18/2016  Chest x-ray 09/21/2016 ,09/20/2016, 09/19/2016, 09/18/2016, 09/16/2016   SIGNIFICANT EVENTS: 1/27  Admitted with dyspnea and hypoxic respiratory failure 1/28  AFib with RVR, responded to metoprolol, sotalol 1/30  Bradycardia in 30s, then AFib with RVR in 150s 1/31  Transferred to ICU due to respiratory distress, tachy-brady 2/03  Started on CCB gtt 2/04  Rate controlled.  2/5 pnx fromm PPM placed 2/5 2/7 dc chest tube  LINES/TUBES: PIV 1/27 2/5 left chest tube>> 09/20/2016   Antimicrobials:   Oral Levaquin 09/09/2016>>>>> 09/12/2016  IV vancomycin 09/18/2016 1   Subjective: Patient noted to go back into atrial fibrillation overnight. Patient denies any chest pain. Mouth pain improving. Patient with complaints of shortness of breath. Patient had some soft stool last night. No watery stools.  Objective: Vitals:   09/25/16 0419 09/25/16 0500 09/25/16 0800 09/25/16 0957  BP: (!) 111/58  110/82   Pulse: (!) 123     Resp: (!) 23  19   Temp: 97.7 F (36.5 C)     TempSrc: Axillary     SpO2: 90%   94%  Weight:  59.9 kg (132 lb 0.9 oz)    Height:        Intake/Output Summary (Last 24 hours) at 09/25/16 1210 Last data filed at 09/25/16 0900  Gross per 24 hour    Intake              490 ml  Output              800 ml  Net             -310 ml   Filed Weights   09/23/16 0300 09/24/16 0406 09/25/16 0500  Weight: 61 kg (134 lb 7.7 oz) 60.1 kg (132 lb 8 oz) 59.9 kg (132 lb 0.9 oz)    Examination:  General exam: Appears calm and comfortable  Respiratory system: Decreased BS in bases. Fair air movement.Expiratory wheezing Cardiovascular system:Irregularly irregular. No JVD, murmurs, rubs, gallops or clicks. No pedal edema. Gastrointestinal system: Abdomen is nondistended, soft and nontender. No organomegaly or masses felt. Normal bowel sounds heard. Central nervous system: Alert and oriented. No focal neurological deficits. Extremities: Symmetric 5 x 5 power. Skin: No rashes, lesions or ulcers Psychiatry: Judgement and insight appear normal. Mood & affect appropriate.     Data Reviewed: I have personally reviewed following labs and imaging studies  CBC:  Recent Labs Lab 09/20/16 0406 09/21/16 0247 09/22/16 0305 09/23/16 0312 09/25/16 0742  WBC 15.5* 23.1* 18.0* 14.6* 16.8*  NEUTROABS  --  19.6* 15.7* 12.1*  --   HGB 12.1 11.8* 10.2* 9.6* 10.4*  HCT 37.9 37.4 32.5* 30.6* 33.1*  MCV 86.7 87.4 88.1 88.2 88.7  PLT 161 214 186 188 [id number]   Basic Metabolic Panel:  Recent Labs Lab 09/20/16 0406 09/21/16 0247 09/21/16 0706 09/22/16 0305  09/23/16 0312 09/24/16 1104 09/25/16 0742  NA 137  --  135 134* 134* 136 137  K 3.9  --  4.4 5.1 4.5 4.0 3.9  CL 93*  --  95* 93* 92* 96* 97*  CO2 33*  --  32 33* 33* 31 28  GLUCOSE 111*  --  86 107* 99 123* 94  BUN 26*  --  29* 35* 35* 33* 28*  CREATININE 1.19*  --  1.14* 1.33* 1.21* 1.48* 1.18*  CALCIUM 8.9  --  8.5* 8.7* 8.5* 9.2 8.8*  MG 2.0 2.0  --   --  2.1  --  1.9   GFR: Estimated Creatinine Clearance: 29.6 mL/min (by C-G formula based on SCr of 1.18 mg/dL (H)). Liver Function Tests: No results for input(s): AST, ALT, ALKPHOS, BILITOT, PROT, ALBUMIN in the last 168 hours. No results for  input(s): LIPASE, AMYLASE in the last 168 hours. No results for input(s): AMMONIA in the last 168 hours. Coagulation Profile:  Recent Labs Lab 09/21/16 0247 09/22/16 0305 09/23/16 0312 09/24/16 1245 09/25/16 0742  INR 2.43 2.57 2.81 2.48 2.74   Cardiac Enzymes: No results for input(s): CKTOTAL, CKMB, CKMBINDEX, TROPONINI in the last 168 hours. BNP (last 3 results) No results for input(s): PROBNP in the last 8760 hours. HbA1C: No results for input(s): HGBA1C in the last 72 hours. CBG:  Recent Labs Lab 09/19/16 0825 09/20/16 0849 09/21/16 0842 09/22/16 0845 09/23/16 0736  GLUCAP 103* 89 105* 91 90   Lipid Profile: No results for input(s): CHOL, HDL, LDLCALC, TRIG, CHOLHDL, LDLDIRECT in the last 72 hours. Thyroid Function Tests: No results for input(s): TSH, T4TOTAL, FREET4, T3FREE, THYROIDAB in the last 72 hours. Anemia Panel: No results for input(s): VITAMINB12, FOLATE, FERRITIN, TIBC, IRON, RETICCTPCT in the last 72 hours. Sepsis Labs: No results for input(s): PROCALCITON, LATICACIDVEN in the last 168 hours.  Recent Results (from the past 240 hour(s))  Surgical pcr screen     Status: None   Collection Time: 09/18/16 12:07 PM  Result Value Ref Range Status   MRSA, PCR NEGATIVE NEGATIVE Final   Staphylococcus aureus NEGATIVE NEGATIVE Final    Comment:        The Xpert SA Assay (FDA approved for NASAL specimens in patients over 64 years of age), is one component of a comprehensive surveillance program.  Test performance has been validated by [facility] for patients greater than or equal to 29 year old. It is not intended to diagnose infection nor to guide or monitor treatment.   Culture, blood (Routine X 2) w Reflex to ID Panel     Status: None (Preliminary result)   Collection Time: 09/21/16  8:45 AM  Result Value Ref Range Status   Specimen Description BLOOD LEFT ANTECUBITAL  Final   Special Requests IN PEDIATRIC BOTTLE 3CC  Final   Culture NO GROWTH 4  DAYS  Final   Report Status PENDING  Incomplete  Culture, blood (Routine X 2) w Reflex to ID Panel     Status: None (Preliminary result)   Collection Time: 09/21/16  8:55 AM  Result Value Ref Range Status   Specimen Description BLOOD LEFT HAND  Final   Special Requests IN PEDIATRIC BOTTLE 3CC  Final   Culture NO GROWTH 4 DAYS  Final   Report Status PENDING  Incomplete  Urine culture     Status: None   Collection Time: 09/22/16 12:45 PM  Result Value Ref Range Status   Specimen Description URINE, RANDOM  Final  Special Requests NONE  Final   Culture NO GROWTH  Final   Report Status 09/23/2016 FINAL  Final         Radiology Studies: Dg Chest 2 View  Result Date: 09/23/2016 CLINICAL DATA:  Hypoxia EXAM: CHEST  2 VIEW COMPARISON:  09/21/2016 FINDINGS: Linear subsegmental atelectasis at the right lung base. There is hyperinflation of the lungs compatible with COPD. Left pacer is unchanged. Heart is normal size. No confluent opacity on the left. No effusions. IMPRESSION: Right basilar subsegmental atelectasis. COPD. Electronically Signed   By: Rolm Baptise M.D.   On: 09/23/2016 15:11        Scheduled Meds: . diltiazem  300 mg Oral BID  . feeding supplement (ENSURE ENLIVE)  237 mL Oral BID BM  . fluconazole  100 mg Oral Daily  . fluticasone  2 spray Each Nare Daily  . ipratropium-albuterol  3 mL Nebulization TID  . loratadine  10 mg Oral Daily  . mouth rinse  15 mL Mouth Rinse BID  . nystatin  5 mL Oral QID  . pantoprazole  40 mg Oral Daily  . polyvinyl alcohol  1 drop Both Eyes TID  . predniSONE  20 mg Oral Q breakfast  . sodium chloride flush  3 mL Intravenous Q12H  . Warfarin - Pharmacist Dosing Inpatient   Does not apply q1800   Continuous Infusions:    LOS: 15 days    Time spent: 25 mins    Rosalie Gelpi, MD [facility] Pager [phone]  If 7PM-7AM, please contact night-coverage [email or web address] Password [facility] 09/25/2016, 12:10 PM

## 2016-09-25 NOTE — Progress Notes (Signed)
SUBJECTIVE: The patient has been in AF since yesterday.  Diarrhea overnight last night. Worsening shortness of breath with AF  CURRENT MEDICATIONS: . diltiazem  240 mg Oral BID  . dofetilide  125 mcg Oral BID  . feeding supplement (ENSURE ENLIVE)  237 mL Oral BID BM  . fluconazole  100 mg Oral Daily  . fluticasone  2 spray Each Nare Daily  . ipratropium-albuterol  3 mL Nebulization TID  . loratadine  10 mg Oral Daily  . mouth rinse  15 mL Mouth Rinse BID  . nystatin  5 mL Oral QID  . pantoprazole  40 mg Oral Daily  . polyvinyl alcohol  1 drop Both Eyes TID  . predniSONE  20 mg Oral Q breakfast  . sodium chloride flush  3 mL Intravenous Q12H  . Warfarin - Pharmacist Dosing Inpatient   Does not apply q1800     OBJECTIVE: Physical Exam: Vitals:   09/24/16 1941 09/24/16 1952 09/25/16 0419 09/25/16 0500  BP: (!) 119/57  (!) 111/58   Pulse: 60  (!) 123   Resp: (!) 26  (!) 23   Temp: 97.6 F (36.4 C)  97.7 F (36.5 C)   TempSrc: Oral  Axillary   SpO2: 90% 90% 90%   Weight:    132 lb 0.9 oz (59.9 kg)  Height:        Intake/Output Summary (Last 24 hours) at 09/25/16 [id number] Last data filed at 09/24/16 2208  Gross per 24 hour  Intake              490 ml  Output              800 ml  Net             -310 ml    Telemetry reveals AF with RVR  GEN- The patient is elderly and chronically ill appearing, alert and oriented x 3 today.   Head- normocephalic, atraumatic Eyes-  Sclera clear, conjunctiva pink Ears- hearing intact Oropharynx- clear Neck- supple  Lungs- slightly increased work of breathing Heart- Tachycardic irregular rate and rhythm GI- soft, NT, ND, + BS Extremities- no clubbing, cyanosis, or edema Skin- no rash or lesion Psych- euthymic mood, full affect Neuro- strength and sensation are intact  LABS: Basic Metabolic Panel:  Recent Labs  09/23/16 0312 09/24/16 1104  NA 134* 136  K 4.5 4.0  CL 92* 96*  CO2 33* 31  GLUCOSE 99 123*  BUN 35* 33*    CREATININE 1.21* 1.48*  CALCIUM 8.5* 9.2  MG 2.1  --    CBC:  Recent Labs  09/23/16 0312  WBC 14.6*  NEUTROABS 12.1*  HGB 9.6*  HCT 30.6*  MCV 88.2  PLT 188    RADIOLOGY: Dg Chest 2 View Result Date: 09/23/2016 CLINICAL DATA:  Hypoxia EXAM: CHEST  2 VIEW COMPARISON:  09/21/2016 FINDINGS: Linear subsegmental atelectasis at the right lung base. There is hyperinflation of the lungs compatible with COPD. Left pacer is unchanged. Heart is normal size. No confluent opacity on the left. No effusions. IMPRESSION: Right basilar subsegmental atelectasis. COPD. Electronically Signed   By: Rolm Baptise M.D.   On: 09/23/2016 15:11   ASSESSMENT AND PLAN:  1.  Persistent atrial fibrillation with RVR She has failed Tikosyn with persistent atrial fibrillation since yesterday Gianna Calef stop Tikosyn today, allow washout and resume Sotalol now that pacemaker is in place Other option would be AVN ablation, but would like to avoid if possible Continue Warfarin  for CHADS2VASC of 5 Increase Diltiazem for now. Would have low threshold to add back low dose BB, but Helia Haese see how rates are on increased Diltiazem first  2.  Sinus node dysfunction S/p PPM Normal device function Ptx s/p implant resolved  3.  Chronic diastolic heart failure Worsened by AF  Continue medical therapy   4.  COPD Per primary team  5.  Diarrhea No fever/abdominal pain Per primary team   Chanetta Marshall, NP 09/25/2016 8:29 AM  I have seen and examined this patient with Chanetta Marshall.  Agree with above, note added to reflect my findings.  On exam, tachycardic, irregular rhythm, no murmurs, lungs clear.  Continues to be in atrial fibrillation despite tikosyn loading. Likely this is a tikosyn failure. She has had sinus rhythm with sotalol in the past. Sharla Tankard plan to stop tikosyn today and start her on sotalol after tikosyn washout. If she does not remain in sinus rhythm on sotalol, Eulalah Rupert plan for AV nodal ablation.  Kourtlynn Trevor M. Duy Lemming  MD 09/25/2016 8:34 AM

## 2016-09-25 NOTE — Progress Notes (Signed)
Occupational Therapy Treatment Patient Details Name: Claudia [name] MRN: [id number] DOB: 1933-04-22 Today's Date: 09/25/2016    History of present illness Claudia [name] is a 83 y.o. female with PHMx significant for paroxysmal A-fib, COPD with chronic hypoxic respiratory failure, chronic diastolic CHF, and HTN who presents with 2 weeks of dry cough and dyspnea and hypoxic respiratory failure.. 1/31 to ICU to respiratory distress.2/5 PPM with pneumothorax and CT placement--CT removed 2/7.   OT comments  Pt session limited due to elevated HR s/p PT session. Session focused on Energy conservation as precursor for home. Pt agreeable to 3n1 and could use it for tub /shower transfer.    Follow Up Recommendations  Home health OT;Supervision/Assistance - 24 hour    Equipment Recommendations  3n1    Recommendations for Other Services      Precautions / Restrictions Precautions Precautions: ICD/Pacemaker;Fall Precaution Comments: monitor O2; recent pacemaker (2/5)       Mobility Bed Mobility               General bed mobility comments: in chair on arrival  Transfers                 General transfer comment: fatigued s/p PT with elevated HR    Balance                                   ADL Overall ADL's : Needs assistance/impaired     Grooming: Wash/dry hands;Wash/dry face;Set up;Sitting                                 General ADL Comments: Educated on Energy conservation with handout provided. Pt transfered to chair with PT prior to OT session with HR sustaining 120s-140s. Pt reports not feeling any different with elevated HR. pt plans to have spouse (A) at d/c with bathing dressing and family to complete shopping with a list.       Vision                     Perception     Praxis      Cognition   Behavior During Therapy: [facility] for tasks assessed/performed Overall Cognitive Status: Within Functional Limits for tasks  assessed                       Extremity/Trunk Assessment               Exercises     Shoulder Instructions       General Comments      Pertinent Vitals/ Pain       Pain Assessment: No/denies pain  Home Living                                          Prior Functioning/Environment              Frequency  Min 2X/week        Progress Toward Goals  OT Goals(current goals can now be found in the care plan section)  Progress towards OT goals: Progressing toward goals  Acute Rehab OT Goals Patient Stated Goal: to be able to go home and get [facility] therapies OT Goal Formulation: With patient Time For  Goal Achievement: 10/06/16 Potential to Achieve Goals: Good ADL Goals Pt Will Perform Grooming: with set-up;with supervision;standing Pt Will Perform Lower Body Bathing: with set-up;with supervision;sit to/from stand Pt Will Perform Lower Body Dressing: with set-up;with supervision;sit to/from stand Pt Will Transfer to Toilet: with supervision;ambulating;grab bars Pt Will Perform Toileting - Clothing Manipulation and hygiene: with supervision;sit to/from stand Additional ADL Goal #1: Pt will be aware of energy conservation strategies from handout that may be of benefit to her.  Plan Discharge plan remains appropriate    Co-evaluation                 End of Session Equipment Utilized During Treatment: Oxygen (4L compared to baseline 2 L)   Activity Tolerance Treatment limited secondary to medical complications (Comment) (HR 140s)   Patient Left in chair;with call bell/phone within reach   Nurse Communication Mobility status;Precautions        Time: [id number] OT Time Calculation (min): 11 min  Charges: OT General Charges $OT Visit: 1 Procedure OT Treatments $Self Care/Home Management : 8-22 mins  Parke Poisson B 09/25/2016, 2:04 PM  Jeri Modena   OTR/L Pager: [phone] Office: [phone] .

## 2016-09-25 NOTE — Progress Notes (Signed)
Physical Therapy Treatment Patient Details Name: Claudia [name] MRN: [id number] DOB: 1933/01/11 Today's Date: 09/25/2016    History of Present Illness Claudia [name] is a 83 y.o. female with PHMx significant for paroxysmal A-fib, COPD with chronic hypoxic respiratory failure, chronic diastolic CHF, and HTN who presents with 2 weeks of dry cough and dyspnea and hypoxic respiratory failure.. 1/31 to ICU to respiratory distress.2/5 PPM with pneumothorax and CT placement--CT removed 2/7.    PT Comments    Pt assisted OOB to recliner today.   Pt reports increased fatigue since being up all night using BSC (diarrhea).  Pt's mobility also limited due to HR (up to 150 bpm with transfer).  Follow Up Recommendations  Home health PT;Supervision/Assistance - 24 hour (HHOT)     Equipment Recommendations  Rolling walker with 5" wheels;3in1 (PT)    Recommendations for Other Services       Precautions / Restrictions Precautions Precautions: ICD/Pacemaker;Fall Precaution Comments: monitor O2 and HR; recent pacemaker (2/5); pt reports she is typically on 2L O2 at night    Mobility  Bed Mobility Overal bed mobility: Needs Assistance Bed Mobility: Supine to Sit     Supine to sit: Min assist     General bed mobility comments: assist for trunk upright  Transfers Overall transfer level: Needs assistance Equipment used: None Transfers: Sit to/from [facility] Sit to Stand: Min guard Stand pivot transfers: Min guard       General transfer comment: min/guard for safety, fatigues quickly, SPo2 dropped to 89% on 4L O2 [place] with transfer, back up to 93% upon leaving room, HR up to 150 bpm, up to recliner only today - deferred ambulation  Ambulation/Gait                 Stairs            Wheelchair Mobility    Modified Rankin (Stroke Patients Only)       Balance                                    Cognition Arousal/Alertness:  Awake/alert Behavior During Therapy: WFL for tasks assessed/performed Overall Cognitive Status: Within Functional Limits for tasks assessed                      Exercises      General Comments        Pertinent Vitals/Pain Pain Assessment: No/denies pain  HR presession: 110 bpm During transfer: 150 bpm End of session: 122 bpm SpO2 also dropped during transfer to 89% on 4L O2 [place] however improved to 91% prior to therapist leaving room    Home Living                      Prior Function            PT Goals (current goals can now be found in the care plan section) Acute Rehab PT Goals Patient Stated Goal: to be able to go home and get [facility] therapies Progress towards PT goals: Not progressing toward goals - comment (limited by fatigue and HR today)    Frequency    Min 3X/week      PT Plan Current plan remains appropriate    Co-evaluation             End of Session Equipment Utilized During Treatment: Oxygen Activity Tolerance: Patient limited  by fatigue Patient left: in chair;with call bell/phone within reach (pt agreeable to call for assist out of recliner)     Time: [id number] PT Time Calculation (min) (ACUTE ONLY): 10 min  Charges:  $Therapeutic Activity: 8-22 mins                    G Codes:      Evadene Wardrip,KATHrine E 10/14/16, 3:44 PM Carmelia Bake, PT, DPT 10-14-16 Pager: [phone]

## 2016-09-25 NOTE — Progress Notes (Signed)
ANTICOAGULATION CONSULT NOTE - Follow Up Consult  Pharmacy Consult for warfarin Indication: atrial fibrillation  Allergies  Allergen Reactions  . Augmentin [Amoxicillin-Pot Clavulanate] Shortness Of Breath  . Doxycycline Shortness Of Breath  . Nitrofurantoin Shortness Of Breath  . Amiodarone Hcl Nausea And Vomiting  . Clarithromycin Other (See Comments)    Crazy feeling, confusion  . Clonazepam Other (See Comments)    Doesn't remember reaction  . Codeine Nausea And Vomiting  . Diltiazem Hcl Nausea And Vomiting  . Fish Oil Nausea And Vomiting  . Flecainide Nausea Only  . Multaq [Dronedarone] Nausea Only  . Rosuvastatin Other (See Comments)    Leg cramps   . Zetia [Ezetimibe] Other (See Comments)    Leg cramps    Patient Measurements: Height: 5\' 2"  (157.5 cm) Weight: 132 lb 0.9 oz (59.9 kg) IBW/kg (Calculated) : 50.1  Vital Signs: Temp: 97.9 F (36.6 C) (02/12 1358) Temp Source: Oral (02/12 1358) BP: 112/82 (02/12 1358) Pulse Rate: 110 (02/12 1358)  Labs:  Recent Labs  09/23/16 0312 09/24/16 1104 09/24/16 1245 09/25/16 0742  HGB 9.6*  --   --  10.4*  HCT 30.6*  --   --  33.1*  PLT 188  --   --  267  LABPROT 30.2*  --  27.3* 29.6*  INR 2.81  --  2.48 2.74  CREATININE 1.21* 1.48*  --  1.18*    Estimated Creatinine Clearance: 29.6 mL/min (by C-G formula based on SCr of 1.18 mg/dL (H)).  Assessment: 85 YOF admitted for SOB on PTA warfarin for Afib. Pharmacy consulted to dose warfarin.    INR therapeutic at 2.74 today,  INR trend last few days 2.8>2.48>2.74, -will decrease coumadin to 0.5mg  today due to likely DDI with fluconazole.  CBC low/improved today and no notes of bleeding. Fluconazole started on 09/22/16.  PTA warfarin dose is 2mg  MWF, 1mg  all other days (dose very stable as outpatient per clinic records).  Cardiology has discontinued the Tikosyn due to failed treatment, persistant afib since 09/24/16. Plan to resume Sotalol after washout period.   Goal  of Therapy:  INR 2-3 Monitor platelets by anticoagulation protocol: Yes   Plan:  -Warfarin 0.5 mg po today -Daily PT/INR -Monitor for drug interactions and signs/symptoms of bleeding  Nicole Cella, RPh Clinical Pharmacist Pager: [phone] 09/25/2016

## 2016-09-26 DIAGNOSIS — S270XXS Traumatic pneumothorax, sequela: Secondary | ICD-10-CM

## 2016-09-26 LAB — CBC
HCT: 33 % — ABNORMAL LOW (ref 36.0–46.0)
Hemoglobin: 10.3 g/dL — ABNORMAL LOW (ref 12.0–15.0)
MCH: 27.8 pg (ref 26.0–34.0)
MCHC: 31.2 g/dL (ref 30.0–36.0)
MCV: 88.9 fL (ref 78.0–100.0)
PLATELETS: 273 10*3/uL (ref 150–400)
RBC: 3.71 MIL/uL — ABNORMAL LOW (ref 3.87–5.11)
RDW: 14.7 % (ref 11.5–15.5)
WBC: 16.6 10*3/uL — AB (ref 4.0–10.5)

## 2016-09-26 LAB — MAGNESIUM: MAGNESIUM: 2 mg/dL (ref 1.7–2.4)

## 2016-09-26 LAB — CULTURE, BLOOD (ROUTINE X 2)
CULTURE: NO GROWTH
CULTURE: NO GROWTH

## 2016-09-26 LAB — BASIC METABOLIC PANEL
ANION GAP: 9 (ref 5–15)
BUN: 25 mg/dL — ABNORMAL HIGH (ref 6–20)
CALCIUM: 8.8 mg/dL — AB (ref 8.9–10.3)
CO2: 30 mmol/L (ref 22–32)
CREATININE: 1.34 mg/dL — AB (ref 0.44–1.00)
Chloride: 96 mmol/L — ABNORMAL LOW (ref 101–111)
GFR, EST AFRICAN AMERICAN: 42 mL/min — AB (ref >60)
GFR, EST NON AFRICAN AMERICAN: 36 mL/min — AB (ref >60)
Glucose, Bld: 104 mg/dL — ABNORMAL HIGH (ref 65–99)
Potassium: 3.7 mmol/L (ref 3.5–5.1)
SODIUM: 135 mmol/L (ref 135–145)

## 2016-09-26 LAB — PROTIME-INR
INR: 3.01
Prothrombin Time: 31.9 seconds — ABNORMAL HIGH (ref 11.4–15.2)

## 2016-09-26 MED ORDER — WARFARIN SODIUM 1 MG PO TABS
0.5000 mg | ORAL_TABLET | Freq: Once | ORAL | Status: AC
  Administered 2016-09-26: 0.5 mg via ORAL
  Filled 2016-09-26: qty 1

## 2016-09-26 MED ORDER — METOPROLOL TARTRATE 25 MG PO TABS
25.0000 mg | ORAL_TABLET | Freq: Four times a day (QID) | ORAL | Status: DC
  Administered 2016-09-26 – 2016-09-30 (×18): 25 mg via ORAL
  Filled 2016-09-26 (×4): qty 1
  Filled 2016-09-26: qty 2
  Filled 2016-09-26 (×11): qty 1
  Filled 2016-09-26: qty 2

## 2016-09-26 MED ORDER — PREDNISONE 10 MG PO TABS
10.0000 mg | ORAL_TABLET | Freq: Every day | ORAL | Status: AC
  Administered 2016-09-27 – 2016-09-29 (×3): 10 mg via ORAL
  Filled 2016-09-26 (×3): qty 1

## 2016-09-26 NOTE — Progress Notes (Signed)
SUBJECTIVE: The patient has been in AF for the last 48 hours. Had rapid rates overnight which improved with metoprolol. Feeling well this AM unaware of palpitations.  CURRENT MEDICATIONS: . arformoterol  15 mcg Nebulization BID  . budesonide (PULMICORT) nebulizer solution  0.25 mg Nebulization BID  . diltiazem  300 mg Oral BID  . feeding supplement (ENSURE ENLIVE)  237 mL Oral BID BM  . fluconazole  100 mg Oral Daily  . fluticasone  2 spray Each Nare Daily  . ipratropium  0.5 mg Nebulization TID  . levalbuterol  0.63 mg Nebulization TID  . loratadine  10 mg Oral Daily  . mouth rinse  15 mL Mouth Rinse BID  . metoprolol tartrate  25 mg Oral Q6H  . nystatin  5 mL Oral QID  . pantoprazole  40 mg Oral Daily  . polyvinyl alcohol  1 drop Both Eyes TID  . predniSONE  10 mg Oral Q breakfast  . sodium chloride flush  3 mL Intravenous Q12H  . Warfarin - Pharmacist Dosing Inpatient   Does not apply q1800     OBJECTIVE: Physical Exam: Vitals:   09/26/16 0742 09/26/16 0743 09/26/16 0745 09/26/16 0748  BP:      Pulse:      Resp:      Temp:      TempSrc:      SpO2: 93% 94% 95% 95%  Weight:      Height:        Intake/Output Summary (Last 24 hours) at 09/26/16 0902 Last data filed at 09/25/16 2150  Gross per 24 hour  Intake              555 ml  Output                0 ml  Net              555 ml    Telemetry reveals AF with RVR  GEN- The patient is elderly and chronically ill appearing, alert and oriented x 3 today.   Head- normocephalic, atraumatic Eyes-  Sclera clear, conjunctiva pink Ears- hearing intact Oropharynx- clear Neck- supple  Lungs- slightly increased work of breathing Heart- Tachycardic irregular rate and rhythm GI- soft, NT, ND, + BS Extremities- no clubbing, cyanosis, or edema Skin- no rash or lesion Psych- euthymic mood, full affect Neuro- strength and sensation are intact  LABS: Basic Metabolic Panel:  Recent Labs  09/25/16 0742 09/26/16 0424   NA 137 135  K 3.9 3.7  CL 97* 96*  CO2 28 30  GLUCOSE 94 104*  BUN 28* 25*  CREATININE 1.18* 1.34*  CALCIUM 8.8* 8.8*  MG 1.9 2.0   CBC:  Recent Labs  09/25/16 0742 09/26/16 0424  WBC 16.8* 16.6*  HGB 10.4* 10.3*  HCT 33.1* 33.0*  MCV 88.7 88.9  PLT 267 273    RADIOLOGY: Dg Chest 2 View Result Date: 09/23/2016 CLINICAL DATA:  Hypoxia EXAM: CHEST  2 VIEW COMPARISON:  09/21/2016 FINDINGS: Linear subsegmental atelectasis at the right lung base. There is hyperinflation of the lungs compatible with COPD. Left pacer is unchanged. Heart is normal size. No confluent opacity on the left. No effusions. IMPRESSION: Right basilar subsegmental atelectasis. COPD. Electronically Signed   By: Rolm Baptise M.D.   On: 09/23/2016 15:11   ASSESSMENT AND PLAN:  1.  Persistent atrial fibrillation with RVR She has failed Tikosyn with persistent atrial fibrillation since yesterday Tikosyn stopped, allow washout and resume Sotalol  tomorrow now that pacemaker is in place Other option would be AVN ablation, but would like to avoid if possible Continue Warfarin for CHADS2VASC of 5 Increase Diltiazem for now. Would have low threshold to add back low dose BB, but Delara Shepheard see how rates are on increased Diltiazem first  2.  Sinus node dysfunction S/p PPM Normal device function Ptx s/p implant resolved  3.  Chronic diastolic heart failure Worsened by AF  Continue medical therapy   4.  COPD Per primary team  Sajad Glander M. Reizy Dunlow MD 09/26/2016 9:02 AM

## 2016-09-26 NOTE — Progress Notes (Signed)
Pt HR remains in the 140's-160's at rest. Pt received HS dose of 300 mg PO cardizem but HR remains elevated. Pt asymptomatic at rest. On call Cardiologist paged for further interventions. Order received for 25mg  of PO metoprolol. Will continue to monitor.

## 2016-09-26 NOTE — Progress Notes (Signed)
PROGRESS NOTE    Claudia [name]  [id number] DOB: Dec 18, 1932 DOA: 09/09/2016 PCP: Pcp Not In System    Brief Narrative:  83yo F admitted 1/27 with dyspnea, cough and hypoxia thought related to AECOPD.  Found to be in AF with RVR 1/28 (responded well to sotalol / metoprolol).  Cardiology following for concerns of tachy-brady syndrome.  She had an episode of respiratory distress 1/31 and was transferred to ICU for bipap.  Course complicated by AF with RVR and episodes of bradycardia.    PMHx of paroxysmal AFib on warfarin, COPD, chronic diastolic HF, and HTN  2/5 left pnx post PPM insertion  Patient improved clinically. Chest tube was removed with resolution of pneumothorax. Patient going in and out of age of fibrillation with cardiology managing her medications for better rate control.    Assessment & Plan:   Principal Problem:   Acute on chronic respiratory failure (HCC) Active Problems:   COPD with acute exacerbation (HCC)   Tachy-brady syndrome (HCC)   Pneumothorax, traumatic   Long term current use of anticoagulant therapy   Paroxysmal atrial fibrillation (HCC)   HTN (hypertension)   COPD exacerbation (HCC)   Chronic diastolic CHF (congestive heart failure) (HCC)   Hypoxia   Respiratory distress   Chronic atrial fibrillation (HCC)   Acute respiratory failure with hypoxemia (HCC)   Acute delirium   Cardiac pacemaker in situ   S/P chest tube placement (HCC)   Leukocytosis   Oral thrush  #1 acute respiratory failure with hypoxia secondary to acute COPD exacerbation Patient with acute respiratory failure with hypoxia thought secondary to acute COPD exacerbation requiring BiPAP. BiPAP currently off with some improvement with respirations. Patient also noted to have a underlying tachybradycardia syndrome that is being managed by cardiology. Continue Xopenex and Atrovent nebulizers 3 times daily pulmonary hygiene, steroid taper. Chest tube has been discontinued by critical  care repeat chest x-rays with resolution of pneumothorax. Due to concern for increased O2 requirements repeat chest x-ray negative for infiltrate however did have some atelectasis.  Continue to wean O2 for sats 88-93%.   #2 tachy- bradycardia syndrome/A. fib with RVR and SSS status post PPM 09/18/2016 CHA2DS2VASC score 5 Patient with improvement with heart rate and currently back in atrial fibrillation overnight, patient also with some complaints of shortness of breath. Patient noted to go briefly into atrial fibrillation early (09/23/2016) morning, and then went back into normal sinus rhythm. Patient noted to be in persistent A. fib overnight. Tikosyn has been discontinued per cardiology. Cardizem dose has been increased and patient restarted back on low-dose metoprolol which was discontinued a few days earlier per cardiology. Per cardiology patient to be started on sotalol once Tikosyn has washed out her system hopefully tomorrow. Patient received a dose of digoxin. Patient on Coumadin for anticoagulation. INR is therapeutic. Per cardiology patient may eventually require AV nodal ablation if returns to A. fib with difficult to control rates.  #3 metabolic alkalosis in the setting of chronic hypercarbia and likely exacerbated by diuretics Continue to monitor. Follow.  #4 acute delirium May be secondary to steroids. Steroids being tapered off per pulmonary. Patient with clinical improvement. Patient likely at baseline. Follow.  #5 left pneumothorax post PPM insertion Chest tube has been removed per critical care today. Repeat chest x-ray with resolution of pneumothorax. Per PCCM.  #6 ?? chronic diastolic heart failure Currently euvolemic. Patient currently at baseline/dry weight. Patient was not on any diuretics prior to admission per patient and also per recent  cardiology note. Per cardiology.  #7 hypertension Blood pressure improving. Monitor closely while patient on Cardizem. Metoprolol resumed  per cardiology for better rate control. Per cardiology.  #8 leukocytosis Patient afebrile. Likely secondary to steroids. Patient denies any diarrhea, no dysuria. Patient does not look septic. Significant jump in white count from 15.5-23.1 on 09/21/2016. WBC trending back down and currently of 16.6.Marland Kitchen Chest x-ray  negative for any acute infiltrate. Blood cultures pending with no growth to date 4 days. Urinalysis nitrite negative chase leukocytes 0-5 WBCs. Urine cultures negative. Will monitor for now. No need for antibiotics at this time will follow.  #9 oral thrush Continue oral Diflucan and nystatin oral suspension.  DVT prophylaxis: Coumadin Code Status: DO NOT RESUSCITATE Family Communication: Updated patient. No family at bedside. Disposition Plan: Home with home health therapies once medically stable, rate controlled, O2 requirements improved, heart rate controlled and per cardiology/EP.   Consultants:   Cardiology: Dr. Debara Pickett 09/12/2016  Electrophysiology Dr.Camintz 09/13/2016  PCCM Dr. Nelda Marseille 09/13/2016  Procedures:  2-D echo 09/13/2016  Pacemaker implantation per Dr. Baird Kay 09/18/2016  Chest x-ray 09/21/2016 ,09/20/2016, 09/19/2016, 09/18/2016, 09/16/2016   SIGNIFICANT EVENTS: 1/27  Admitted with dyspnea and hypoxic respiratory failure 1/28  AFib with RVR, responded to metoprolol, sotalol 1/30  Bradycardia in 30s, then AFib with RVR in 150s 1/31  Transferred to ICU due to respiratory distress, tachy-brady 2/03  Started on CCB gtt 2/04  Rate controlled.  2/5 pnx fromm PPM placed 2/5 2/7 dc chest tube  LINES/TUBES: PIV 1/27 2/5 left chest tube>> 09/20/2016   Antimicrobials:   Oral Levaquin 09/09/2016>>>>> 09/12/2016  IV vancomycin 09/18/2016 1   Subjective: Patient noted to go back into atrial fibrillation overnight. Patient denies any chest pain. Mouth pain improving. Patient sitting up in chest dated shortness of breath has improved since yesterday.  Feels better today.   Objective: Vitals:   09/26/16 0742 09/26/16 0743 09/26/16 0745 09/26/16 0748  BP:      Pulse:      Resp:      Temp:      TempSrc:      SpO2: 93% 94% 95% 95%  Weight:      Height:        Intake/Output Summary (Last 24 hours) at 09/26/16 1136 Last data filed at 09/25/16 2150  Gross per 24 hour  Intake              555 ml  Output                0 ml  Net              555 ml   Filed Weights   09/24/16 0406 09/25/16 0500 09/26/16 0411  Weight: 60.1 kg (132 lb 8 oz) 59.9 kg (132 lb 0.9 oz) 61.6 kg (135 lb 11.2 oz)    Examination:  General exam: Appears calm and comfortable  Respiratory system: Decreased BS in bases. Fair air movement. No wheezing Cardiovascular system:Irregularly irregular. No JVD, murmurs, rubs, gallops or clicks. No pedal edema. Gastrointestinal system: Abdomen is nondistended, soft and nontender. No organomegaly or masses felt. Normal bowel sounds heard. Central nervous system: Alert and oriented. No focal neurological deficits. Extremities: Symmetric 5 x 5 power. Skin: No rashes, lesions or ulcers Psychiatry: Judgement and insight appear normal. Mood & affect appropriate.     Data Reviewed: I have personally reviewed following labs and imaging studies  CBC:  Recent Labs Lab 09/21/16 0247 09/22/16 0305 09/23/16 [id number] 09/25/16 [id number]  09/26/16 0424  WBC 23.1* 18.0* 14.6* 16.8* 16.6*  NEUTROABS 19.6* 15.7* 12.1*  --   --   HGB 11.8* 10.2* 9.6* 10.4* 10.3*  HCT 37.4 32.5* 30.6* 33.1* 33.0*  MCV 87.4 88.1 88.2 88.7 88.9  PLT 214 186 188 267 [id number]   Basic Metabolic Panel:  Recent Labs Lab 09/20/16 0406 09/21/16 0247  09/22/16 0305 09/23/16 0312 09/24/16 1104 09/25/16 0742 09/26/16 0424  NA 137  --   < > 134* 134* 136 137 135  K 3.9  --   < > 5.1 4.5 4.0 3.9 3.7  CL 93*  --   < > 93* 92* 96* 97* 96*  CO2 33*  --   < > 33* 33* 31 28 30   GLUCOSE 111*  --   < > 107* 99 123* 94 104*  BUN 26*  --   < > 35* 35* 33* 28* 25*    CREATININE 1.19*  --   < > 1.33* 1.21* 1.48* 1.18* 1.34*  CALCIUM 8.9  --   < > 8.7* 8.5* 9.2 8.8* 8.8*  MG 2.0 2.0  --   --  2.1  --  1.9 2.0  < > = values in this interval not displayed. GFR: Estimated Creatinine Clearance: 28.4 mL/min (by C-G formula based on SCr of 1.34 mg/dL (H)). Liver Function Tests: No results for input(s): AST, ALT, ALKPHOS, BILITOT, PROT, ALBUMIN in the last 168 hours. No results for input(s): LIPASE, AMYLASE in the last 168 hours. No results for input(s): AMMONIA in the last 168 hours. Coagulation Profile:  Recent Labs Lab 09/22/16 0305 09/23/16 0312 09/24/16 1245 09/25/16 0742 09/26/16 0424  INR 2.57 2.81 2.48 2.74 3.01   Cardiac Enzymes: No results for input(s): CKTOTAL, CKMB, CKMBINDEX, TROPONINI in the last 168 hours. BNP (last 3 results) No results for input(s): PROBNP in the last 8760 hours. HbA1C: No results for input(s): HGBA1C in the last 72 hours. CBG:  Recent Labs Lab 09/20/16 0849 09/21/16 0842 09/22/16 0845 09/23/16 0736  GLUCAP 89 105* 91 90   Lipid Profile: No results for input(s): CHOL, HDL, LDLCALC, TRIG, CHOLHDL, LDLDIRECT in the last 72 hours. Thyroid Function Tests: No results for input(s): TSH, T4TOTAL, FREET4, T3FREE, THYROIDAB in the last 72 hours. Anemia Panel: No results for input(s): VITAMINB12, FOLATE, FERRITIN, TIBC, IRON, RETICCTPCT in the last 72 hours. Sepsis Labs: No results for input(s): PROCALCITON, LATICACIDVEN in the last 168 hours.  Recent Results (from the past 240 hour(s))  Surgical pcr screen     Status: None   Collection Time: 09/18/16 12:07 PM  Result Value Ref Range Status   MRSA, PCR NEGATIVE NEGATIVE Final   Staphylococcus aureus NEGATIVE NEGATIVE Final    Comment:        The Xpert SA Assay (FDA approved for NASAL specimens in patients over 65 years of age), is one component of a comprehensive surveillance program.  Test performance has been validated by [facility] for patients  greater than or equal to 39 year old. It is not intended to diagnose infection nor to guide or monitor treatment.   Culture, blood (Routine X 2) w Reflex to ID Panel     Status: None (Preliminary result)   Collection Time: 09/21/16  8:45 AM  Result Value Ref Range Status   Specimen Description BLOOD LEFT ANTECUBITAL  Final   Special Requests IN PEDIATRIC BOTTLE 3CC  Final   Culture NO GROWTH 4 DAYS  Final   Report Status PENDING  Incomplete  Culture, blood (Routine X 2) w Reflex to ID Panel     Status: None (Preliminary result)   Collection Time: 09/21/16  8:55 AM  Result Value Ref Range Status   Specimen Description BLOOD LEFT HAND  Final   Special Requests IN PEDIATRIC BOTTLE 3CC  Final   Culture NO GROWTH 4 DAYS  Final   Report Status PENDING  Incomplete  Urine culture     Status: None   Collection Time: 09/22/16 12:45 PM  Result Value Ref Range Status   Specimen Description URINE, RANDOM  Final   Special Requests NONE  Final   Culture NO GROWTH  Final   Report Status 09/23/2016 FINAL  Final         Radiology Studies: No results found.      Scheduled Meds: . arformoterol  15 mcg Nebulization BID  . budesonide (PULMICORT) nebulizer solution  0.25 mg Nebulization BID  . diltiazem  300 mg Oral BID  . feeding supplement (ENSURE ENLIVE)  237 mL Oral BID BM  . fluconazole  100 mg Oral Daily  . fluticasone  2 spray Each Nare Daily  . ipratropium  0.5 mg Nebulization TID  . levalbuterol  0.63 mg Nebulization TID  . loratadine  10 mg Oral Daily  . mouth rinse  15 mL Mouth Rinse BID  . metoprolol tartrate  25 mg Oral Q6H  . nystatin  5 mL Oral QID  . pantoprazole  40 mg Oral Daily  . polyvinyl alcohol  1 drop Both Eyes TID  . predniSONE  10 mg Oral Q breakfast  . sodium chloride flush  3 mL Intravenous Q12H  . Warfarin - Pharmacist Dosing Inpatient   Does not apply q1800   Continuous Infusions:    LOS: 16 days    Time spent: 87 mins    Shree Espey,  MD [facility] Pager [phone]  If 7PM-7AM, please contact night-coverage [email or web address] Password [facility] 09/26/2016, 11:36 AM

## 2016-09-26 NOTE — Plan of Care (Signed)
Problem: Cardiac: Goal: Ability to achieve and maintain adequate cardiopulmonary perfusion will improve Outcome: Not Progressing Pt with HR 140's-160's at rest. PO dose of cardizem given without decrease in HR. Order received to administer 25 mg of PO metoprolol.

## 2016-09-26 NOTE — Progress Notes (Signed)
ANTICOAGULATION CONSULT NOTE - Follow Up Consult  Pharmacy Consult for warfarin Indication: atrial fibrillation  Allergies  Allergen Reactions  . Augmentin [Amoxicillin-Pot Clavulanate] Shortness Of Breath  . Doxycycline Shortness Of Breath  . Nitrofurantoin Shortness Of Breath  . Acyclovir And Related   . Amiodarone Hcl Nausea And Vomiting  . Clarithromycin Other (See Comments)    Crazy feeling, confusion  . Clonazepam Other (See Comments)    Doesn't remember reaction  . Codeine Nausea And Vomiting  . Diltiazem Hcl Nausea And Vomiting  . Fish Oil Nausea And Vomiting  . Flecainide Nausea Only  . Multaq [Dronedarone] Nausea Only  . Rosuvastatin Other (See Comments)    Leg cramps   . Zetia [Ezetimibe] Other (See Comments)    Leg cramps    Patient Measurements: Height: 5\' 2"  (157.5 cm) Weight: 135 lb 11.2 oz (61.6 kg) IBW/kg (Calculated) : 50.1  Vital Signs: Temp: 97.5 F (36.4 C) (02/13 1529) Temp Source: Oral (02/13 1529) BP: 97/55 (02/13 1529) Pulse Rate: 83 (02/13 1529)  Labs:  Recent Labs  09/24/16 1104 09/24/16 1245 09/25/16 0742 09/26/16 0424  HGB  --   --  10.4* 10.3*  HCT  --   --  33.1* 33.0*  PLT  --   --  267 273  LABPROT  --  27.3* 29.6* 31.9*  INR  --  2.48 2.74 3.01  CREATININE 1.48*  --  1.18* 1.34*    Estimated Creatinine Clearance: 28.4 mL/min (by C-G formula based on SCr of 1.34 mg/dL (H)).  Assessment: 33 YOF admitted for SOB on PTA warfarin for Afib. Pharmacy consulted to dose warfarin.    INR therapeutic at 3.01 today,  INR trend last few days 2.8>2.48>2.74>3.01, -will give low dose of coumadin to 0.5mg  today due to likely DDI with fluconazole.  CBC low/stable today and no bleeding reported. Fluconazole started on 09/22/16.  PTA warfarin dose was 2mg  MWF, 1mg  all other days (dose very stable as outpatient per clinic records.    Goal of Therapy:  INR 2-3 Monitor platelets by anticoagulation protocol: Yes   Plan:  -Warfarin 0.5 mg  po today -Daily PT/INR -Monitor for drug interactions and signs/symptoms of bleeding  Nicole Cella, RPh Clinical Pharmacist Pager: [phone] 09/26/2016

## 2016-09-27 ENCOUNTER — Ambulatory Visit: Payer: Medicare Other

## 2016-09-27 DIAGNOSIS — B370 Candidal stomatitis: Secondary | ICD-10-CM

## 2016-09-27 LAB — PROTIME-INR
INR: 3.02
PROTHROMBIN TIME: 32 s — AB (ref 11.4–15.2)

## 2016-09-27 LAB — BASIC METABOLIC PANEL
Anion gap: 10 (ref 5–15)
BUN: 30 mg/dL — AB (ref 6–20)
CALCIUM: 8.7 mg/dL — AB (ref 8.9–10.3)
CO2: 29 mmol/L (ref 22–32)
Chloride: 97 mmol/L — ABNORMAL LOW (ref 101–111)
Creatinine, Ser: 1.33 mg/dL — ABNORMAL HIGH (ref 0.44–1.00)
GFR calc Af Amer: 42 mL/min — ABNORMAL LOW (ref >60)
GFR, EST NON AFRICAN AMERICAN: 36 mL/min — AB (ref >60)
Glucose, Bld: 95 mg/dL (ref 65–99)
POTASSIUM: 3.5 mmol/L (ref 3.5–5.1)
SODIUM: 136 mmol/L (ref 135–145)

## 2016-09-27 LAB — GLUCOSE, CAPILLARY
GLUCOSE-CAPILLARY: 92 mg/dL (ref 65–99)
Glucose-Capillary: 109 mg/dL — ABNORMAL HIGH (ref 65–99)

## 2016-09-27 MED ORDER — SOTALOL HCL 80 MG PO TABS
80.0000 mg | ORAL_TABLET | Freq: Two times a day (BID) | ORAL | Status: DC
  Administered 2016-09-27 – 2016-09-30 (×7): 80 mg via ORAL
  Filled 2016-09-27 (×7): qty 1

## 2016-09-27 MED ORDER — WARFARIN SODIUM 1 MG PO TABS
0.5000 mg | ORAL_TABLET | Freq: Once | ORAL | Status: AC
  Administered 2016-09-27: 0.5 mg via ORAL
  Filled 2016-09-27: qty 1

## 2016-09-27 NOTE — Progress Notes (Signed)
Physical Therapy Treatment Patient Details Name: Claudia [name] MRN: [id number] DOB: 07-30-1933 Today's Date: 09/27/2016    History of Present Illness Claudia [name] is a 83 y.o. female with PHMx significant for paroxysmal A-fib, COPD with chronic hypoxic respiratory failure, chronic diastolic CHF, and HTN who presents with 2 weeks of dry cough and dyspnea and hypoxic respiratory failure.. 1/31 to ICU to respiratory distress.2/5 PPM with pneumothorax and CT placement--CT removed 2/7.    PT Comments    Pt willing to participate with therapy today, does still report fatigue. Pt's HR max was 134 with exercising and coughing. SaO2 >/= 92% with session. Pt unable to ambulate today due to fatigue and shortness of breath after exercises performed. Acute PT to continue during pt's hospital stay.   Follow Up Recommendations  Home health PT;Supervision/Assistance - 24 hour     Equipment Recommendations  Rolling walker with 5" wheels;3in1 (PT)    Precautions / Restrictions Precautions Precautions: ICD/Pacemaker;Fall Precaution Comments: monitor O2 and HR; recent pacemaker (2/5); pt reports she is typically on 2L O2 at night Restrictions LUE Weight Bearing: Touch down weight bearing Other Position/Activity Restrictions: pacer          Cognition Arousal/Alertness: Awake/alert Behavior During Therapy: WFL for tasks assessed/performed Overall Cognitive Status: Within Functional Limits for tasks assessed           Exercises General Exercises - Lower Extremity Long Arc Quad: AROM;Strengthening;Both;10 reps;Seated Hip Flexion/Marching: AROM;Strengthening;Both;10 reps;Seated Toe Raises: AROM;Strengthening;Both;10 reps;Seated;Limitations Toe Raises Limitations: heel/toe raises x 10 in seated Heel Raises: AROM;Strengthening;Both;10 reps;Standing;Limitations Heel Raises Limitations: just heel raises x 10 with RW support for balance Mini-Sqauts: AROM;Strengthening;Both;10  reps;Standing;Limitations Mini Squats Limitations: with RW support for balance     Pertinent Vitals/Pain Pain Assessment: No/denies pain     PT Goals (current goals can now be found in the care plan section) Acute Rehab PT Goals Patient Stated Goal: to be able to go home and get [facility] therapies PT Goal Formulation: With patient/family Time For Goal Achievement: 09/29/16 Potential to Achieve Goals: Good Progress towards PT goals: Progressing toward goals    Frequency    Min 3X/week      PT Plan Current plan remains appropriate    End of Session Equipment Utilized During Treatment: Gait belt;Oxygen Activity Tolerance: Patient tolerated treatment well;Patient limited by fatigue;No increased pain;Treatment limited secondary to medical complications (Comment) (increased shortness of breath with exercises) Patient left: in chair;with call bell/phone within reach     Time: 0920-0935 PT Time Calculation (min) (ACUTE ONLY): 15 min  Charges:  $Therapeutic Exercise: 8-22 mins           Willow Ora 09/27/2016, 9:40 AM  Willow Ora, PTA, CLT Acute Rehab Services [phone] 09/27/16, 9:41 AM

## 2016-09-27 NOTE — Progress Notes (Signed)
ANTICOAGULATION CONSULT NOTE - Follow Up Consult  Pharmacy Consult for warfarin Indication: atrial fibrillation  Allergies  Allergen Reactions  . Augmentin [Amoxicillin-Pot Clavulanate] Shortness Of Breath  . Doxycycline Shortness Of Breath  . Nitrofurantoin Shortness Of Breath  . Acyclovir And Related   . Amiodarone Hcl Nausea And Vomiting  . Clarithromycin Other (See Comments)    Crazy feeling, confusion  . Clonazepam Other (See Comments)    Doesn't remember reaction  . Codeine Nausea And Vomiting  . Diltiazem Hcl Nausea And Vomiting  . Fish Oil Nausea And Vomiting  . Flecainide Nausea Only  . Multaq [Dronedarone] Nausea Only  . Rosuvastatin Other (See Comments)    Leg cramps   . Zetia [Ezetimibe] Other (See Comments)    Leg cramps    Patient Measurements: Height: 5\' 2"  (157.5 cm) Weight: 132 lb (59.9 kg) IBW/kg (Calculated) : 50.1  Vital Signs: Temp: 98.9 F (37.2 C) (02/14 1324) Temp Source: Oral (02/14 1324) BP: 118/71 (02/14 1324) Pulse Rate: 104 (02/14 1324)  Labs:  Recent Labs  09/25/16 0742 09/26/16 0424 09/27/16 0508  HGB 10.4* 10.3*  --   HCT 33.1* 33.0*  --   PLT 267 273  --   LABPROT 29.6* 31.9* 32.0*  INR 2.74 3.01 3.02  CREATININE 1.18* 1.34* 1.33*    Estimated Creatinine Clearance: 26.2 mL/min (by C-G formula based on SCr of 1.33 mg/dL (H)).  Assessment: 3 YOF admitted for SOB on PTA warfarin for Afib. Pharmacy consulted to dose warfarin for h/o Afib.    INR therapeutic at 3.02 today, about same as yesterday. INR at top end of therapeutic range INR trend last few days 2.48>2.74>3.01>3.02 We have given lower doses of coumadin 0.5mg  last 2 days due to likely DDI with fluconazole for oral thrush.  CBC low/stable today and no bleeding reported. Fluconazole started on 09/22/16 and stop date in to end after dose tomorrow 09/28/16.   PTA warfarin dose was 2mg  MWF, 1mg  all other days (dose very stable as outpatient per clinic records.    Goal  of Therapy:  INR 2-3 Monitor platelets by anticoagulation protocol: Yes   Plan:  -Warfarin 0.5 mg po today -Daily PT/INR -Monitor for drug interactions and signs/symptoms of bleeding  Nicole Cella, RPh Clinical Pharmacist Pager: [phone] 09/27/2016

## 2016-09-27 NOTE — Progress Notes (Addendum)
PROGRESS NOTE    Claudia [name]  [id number] DOB: [birth year] DOA: 09/09/2016 PCP: Pcp Not In System   Brief Narrative: 83yo F admitted 1/27 with dyspnea, cough and hypoxia thought related to AECOPD. Found to be in AF with RVR 1/28. On 2/5 left pnx post PPM insertion, chest tube was removed with resolution of pneumothorax. Patient going in and out of age of fibrillation with cardiology managing her medications for better rate control.  Assessment & Plan:  # Atrial fibrillation with RVR/tachycardia bradycardia syndrome: Patient with fluctuation in heart rate and followed by cardiologist. -Added sotalol today by cardiologist  -Currently on diltiazem, metoprolol for rate control. -On Coumadin for systemic anticoagulation -Monitor heart rate and INR closely. -Cardiac medications adjusted by cardiologist.  #Acute on chronic hypoxic respiratory failure due to acute COPD exacerbation: -Patient is clinically improved. No shortness of breath now. Continue current bronchodilators. Plan to taper her steroids. -Patient uses about 2 L of oxygen at night at home.  #Left pneumothorax post PPM insertion: Chest from has been removed. Patient is clinically stable.  #Acute delirium in the hospital likely due to steroid: Patient's mental status is stable today. Continue supportive care.  #Chronic congestive heart failure: Echo on 1/31 showed EF of 60% with grade 2 diastolic dysfunction. Patient is euvolemic on exam.  #Oral thrush: On nystatin and fluconazole.  #Acute kidney injury likely hemodynamically mediated in the setting of tachycardia. Serum creatinine is stable today. Continue to monitor.  Principal Problem:   Acute on chronic respiratory failure (HCC) Active Problems:   Long term current use of anticoagulant therapy   Paroxysmal atrial fibrillation (HCC)   HTN (hypertension)   COPD exacerbation (HCC)   Chronic diastolic CHF (congestive heart failure) (HCC)   COPD with acute  exacerbation (HCC)   Hypoxia   Respiratory distress   Tachy-brady syndrome (HCC)   Chronic atrial fibrillation (HCC)   Acute respiratory failure with hypoxemia (HCC)   Pneumothorax, traumatic   Acute delirium   Cardiac pacemaker in situ   S/P chest tube placement (HCC)   Leukocytosis   Oral thrush  DVT prophylaxis: Systemic anticoagulation Code Status: DO NOT RESUSCITATE Family Communication: No family present at bedside Disposition Plan: Home with home care in 1-2 days. Depending on cardiologist planned.  Consultants:   Cardiology: Dr. Debara Pickett 09/12/2016  Electrophysiology Dr.Camintz 09/13/2016  PCCM Dr. Nelda Marseille 09/13/2016  Procedures:  2-D echo 09/13/2016  Pacemaker implantation per Dr. Baird Kay 09/18/2016  SIGNIFICANT EVENTS: 1/27 Admitted with dyspnea and hypoxic respiratory failure 1/28 AFib with RVR, responded to metoprolol, sotalol 1/30 Bradycardia in 30s, then AFib with RVR in 150s 1/31 Transferred to ICU due to respiratory distress, tachy-brady 2/03 Started on CCB gtt 2/04 Rate controlled.  2/5 pnx fromm PPM placed 2/5 2/7 dc chest tube  LINES/TUBES: PIV 1/27 2/5 left chest tube>> 09/20/2016  Antimicrobials:  Oral Levaquin 09/09/2016>>>>> 09/12/2016  IV vancomycin 09/18/2016 1 Subjective: Patient was seen and examined at bedside. Heart rate is still fluctuating with episodes of tachycardia. Patient was lying flat on bed. Reported feeling good. Denied headache, dizziness, nausea, vomiting, chest pain or shortness of breath.  Objective: Vitals:   09/26/16 2352 09/27/16 0437 09/27/16 0741 09/27/16 1057  BP:  103/73  (!) 114/56  Pulse: (!) 122 98    Resp:  (!) 23    Temp:  98.5 F (36.9 C)    TempSrc:  Oral    SpO2:  97% 96%   Weight:  59.9 kg (132 lb)    Height:  Intake/Output Summary (Last 24 hours) at 09/27/16 1107 Last data filed at 09/26/16 2200  Gross per 24 hour  Intake              240 ml  Output              300 ml  Net               -60 ml   Filed Weights   09/25/16 0500 09/26/16 0411 09/27/16 0437  Weight: 59.9 kg (132 lb 0.9 oz) 61.6 kg (135 lb 11.2 oz) 59.9 kg (132 lb)    Examination:  General exam: Appears calm and comfortable  Respiratory system: Clear to auscultation. Respiratory effort normal.  Cardiovascular system: S1 & S2 heard, Irregular heart rate.  No pedal edema. Gastrointestinal system: Abdomen is nondistended, soft and nontender. Normal bowel sounds heard. Central nervous system: Alert and oriented. No focal neurological deficits. Extremities: Symmetric 5 x 5 power. Skin: No rashes, lesions or ulcers Psychiatry: Judgement and insight appear normal. Mood & affect appropriate.     Data Reviewed: I have personally reviewed following labs and imaging studies  CBC:  Recent Labs Lab 09/21/16 0247 09/22/16 0305 09/23/16 0312 09/25/16 0742 09/26/16 0424  WBC 23.1* 18.0* 14.6* 16.8* 16.6*  NEUTROABS 19.6* 15.7* 12.1*  --   --   HGB 11.8* 10.2* 9.6* 10.4* 10.3*  HCT 37.4 32.5* 30.6* 33.1* 33.0*  MCV 87.4 88.1 88.2 88.7 88.9  PLT 214 186 188 267 [id number]   Basic Metabolic Panel:  Recent Labs Lab 09/21/16 0247  09/23/16 0312 09/24/16 1104 09/25/16 0742 09/26/16 0424 09/27/16 0508  NA  --   < > 134* 136 137 135 136  K  --   < > 4.5 4.0 3.9 3.7 3.5  CL  --   < > 92* 96* 97* 96* 97*  CO2  --   < > 33* 31 28 30 29   GLUCOSE  --   < > 99 123* 94 104* 95  BUN  --   < > 35* 33* 28* 25* 30*  CREATININE  --   < > 1.21* 1.48* 1.18* 1.34* 1.33*  CALCIUM  --   < > 8.5* 9.2 8.8* 8.8* 8.7*  MG 2.0  --  2.1  --  1.9 2.0  --   < > = values in this interval not displayed. GFR: Estimated Creatinine Clearance: 26.2 mL/min (by C-G formula based on SCr of 1.33 mg/dL (H)). Liver Function Tests: No results for input(s): AST, ALT, ALKPHOS, BILITOT, PROT, ALBUMIN in the last 168 hours. No results for input(s): LIPASE, AMYLASE in the last 168 hours. No results for input(s): AMMONIA in the last 168  hours. Coagulation Profile:  Recent Labs Lab 09/23/16 0312 09/24/16 1245 09/25/16 0742 09/26/16 0424 09/27/16 0508  INR 2.81 2.48 2.74 3.01 3.02   Cardiac Enzymes: No results for input(s): CKTOTAL, CKMB, CKMBINDEX, TROPONINI in the last 168 hours. BNP (last 3 results) No results for input(s): PROBNP in the last 8760 hours. HbA1C: No results for input(s): HGBA1C in the last 72 hours. CBG:  Recent Labs Lab 09/21/16 0842 09/22/16 0845 09/23/16 0736 09/27/16 0816  GLUCAP 105* 91 90 92   Lipid Profile: No results for input(s): CHOL, HDL, LDLCALC, TRIG, CHOLHDL, LDLDIRECT in the last 72 hours. Thyroid Function Tests: No results for input(s): TSH, T4TOTAL, FREET4, T3FREE, THYROIDAB in the last 72 hours. Anemia Panel: No results for input(s): VITAMINB12, FOLATE, FERRITIN, TIBC, IRON, RETICCTPCT in the last 72 hours.  Sepsis Labs: No results for input(s): PROCALCITON, LATICACIDVEN in the last 168 hours.  Recent Results (from the past 240 hour(s))  Surgical pcr screen     Status: None   Collection Time: 09/18/16 12:07 PM  Result Value Ref Range Status   MRSA, PCR NEGATIVE NEGATIVE Final   Staphylococcus aureus NEGATIVE NEGATIVE Final    Comment:        The Xpert SA Assay (FDA approved for NASAL specimens in patients over 80 years of age), is one component of a comprehensive surveillance program.  Test performance has been validated by [facility] for patients greater than or equal to 20 year old. It is not intended to diagnose infection nor to guide or monitor treatment.   Culture, blood (Routine X 2) w Reflex to ID Panel     Status: None   Collection Time: 09/21/16  8:45 AM  Result Value Ref Range Status   Specimen Description BLOOD LEFT ANTECUBITAL  Final   Special Requests IN PEDIATRIC BOTTLE 3CC  Final   Culture NO GROWTH 5 DAYS  Final   Report Status 09/26/2016 FINAL  Final  Culture, blood (Routine X 2) w Reflex to ID Panel     Status: None   Collection Time:  09/21/16  8:55 AM  Result Value Ref Range Status   Specimen Description BLOOD LEFT HAND  Final   Special Requests IN PEDIATRIC BOTTLE 3CC  Final   Culture NO GROWTH 5 DAYS  Final   Report Status 09/26/2016 FINAL  Final  Urine culture     Status: None   Collection Time: 09/22/16 12:45 PM  Result Value Ref Range Status   Specimen Description URINE, RANDOM  Final   Special Requests NONE  Final   Culture NO GROWTH  Final   Report Status 09/23/2016 FINAL  Final         Radiology Studies: No results found.      Scheduled Meds: . arformoterol  15 mcg Nebulization BID  . budesonide (PULMICORT) nebulizer solution  0.25 mg Nebulization BID  . diltiazem  300 mg Oral BID  . feeding supplement (ENSURE ENLIVE)  237 mL Oral BID BM  . fluconazole  100 mg Oral Daily  . fluticasone  2 spray Each Nare Daily  . ipratropium  0.5 mg Nebulization TID  . levalbuterol  0.63 mg Nebulization TID  . loratadine  10 mg Oral Daily  . mouth rinse  15 mL Mouth Rinse BID  . metoprolol tartrate  25 mg Oral Q6H  . nystatin  5 mL Oral QID  . pantoprazole  40 mg Oral Daily  . polyvinyl alcohol  1 drop Both Eyes TID  . predniSONE  10 mg Oral Q breakfast  . sodium chloride flush  3 mL Intravenous Q12H  . sotalol  80 mg Oral Q12H  . Warfarin - Pharmacist Dosing Inpatient   Does not apply q1800   Continuous Infusions:   LOS: 17 days    Laela Deviney Tanna Furry, MD [facility] Pager [phone]  If 7PM-7AM, please contact night-coverage [email or web address] Password [facility] 09/27/2016, 11:07 AM

## 2016-09-27 NOTE — Progress Notes (Signed)
SUBJECTIVE: The patient is doing ok this morning. No chest pain or shortness of breath at rest. Rates improved with addition of metoprolol.   CURRENT MEDICATIONS: . arformoterol  15 mcg Nebulization BID  . budesonide (PULMICORT) nebulizer solution  0.25 mg Nebulization BID  . diltiazem  300 mg Oral BID  . feeding supplement (ENSURE ENLIVE)  237 mL Oral BID BM  . fluconazole  100 mg Oral Daily  . fluticasone  2 spray Each Nare Daily  . ipratropium  0.5 mg Nebulization TID  . levalbuterol  0.63 mg Nebulization TID  . loratadine  10 mg Oral Daily  . mouth rinse  15 mL Mouth Rinse BID  . metoprolol tartrate  25 mg Oral Q6H  . nystatin  5 mL Oral QID  . pantoprazole  40 mg Oral Daily  . polyvinyl alcohol  1 drop Both Eyes TID  . predniSONE  10 mg Oral Q breakfast  . sodium chloride flush  3 mL Intravenous Q12H  . sotalol  80 mg Oral Q12H  . Warfarin - Pharmacist Dosing Inpatient   Does not apply q1800     OBJECTIVE: Physical Exam: Vitals:   09/26/16 2125 09/26/16 2352 09/27/16 0437 09/27/16 0741  BP: 105/74  103/73   Pulse: (!) 133 (!) 122 98   Resp: (!) 27  (!) 23   Temp: 98.3 F (36.8 C)  98.5 F (36.9 C)   TempSrc: Oral  Oral   SpO2: 97%  97% 96%  Weight:   132 lb (59.9 kg)   Height:        Intake/Output Summary (Last 24 hours) at 09/27/16 0804 Last data filed at 09/26/16 2200  Gross per 24 hour  Intake              240 ml  Output              300 ml  Net              -60 ml    Telemetry reveals AF with controlled ventricular response  GEN- The patient is elderly and chronically ill appearing, alert and oriented x 3 today.   Head- normocephalic, atraumatic Eyes-  Sclera clear, conjunctiva pink Ears- hearing intact Oropharynx- clear Neck- supple  Lungs- slightly increased work of breathing Heart- irregular rate and rhythm GI- soft, NT, ND, + BS Extremities- no clubbing, cyanosis, or edema Skin- no rash or lesion Psych- euthymic mood, full  affect Neuro- strength and sensation are intact  LABS: Basic Metabolic Panel:  Recent Labs  09/25/16 0742 09/26/16 0424 09/27/16 0508  NA 137 135 136  K 3.9 3.7 3.5  CL 97* 96* 97*  CO2 28 30 29   GLUCOSE 94 104* 95  BUN 28* 25* 30*  CREATININE 1.18* 1.34* 1.33*  CALCIUM 8.8* 8.8* 8.7*  MG 1.9 2.0  --    CBC:  Recent Labs  09/25/16 0742 09/26/16 0424  WBC 16.8* 16.6*  HGB 10.4* 10.3*  HCT 33.1* 33.0*  MCV 88.7 88.9  PLT 267 273    RADIOLOGY: Dg Chest 2 View Result Date: 09/23/2016 CLINICAL DATA:  Hypoxia EXAM: CHEST  2 VIEW COMPARISON:  09/21/2016 FINDINGS: Linear subsegmental atelectasis at the right lung base. There is hyperinflation of the lungs compatible with COPD. Left pacer is unchanged. Heart is normal size. No confluent opacity on the left. No effusions. IMPRESSION: Right basilar subsegmental atelectasis. COPD. Electronically Signed   By: Rolm Baptise M.D.   On: 09/23/2016 15:11  ASSESSMENT AND PLAN:  1.  Persistent atrial fibrillation with RVR She has failed Tikosyn with persistent atrial fibrillation since yesterday S/p Tikosyn washout, Amarius Toto resume Sotalol this morning. Hopefully Radiance Deady maintain SR, if not could consider AVN ablation, but would like to avoid if possible Continue Warfarin for CHADS2VASC of 5 Rates controlled on Metoprolol and Diltiazem  If still in AF on Friday morning after Sotalol load, Breckin Zafar pursue DCCV   2.  Sinus node dysfunction S/p PPM Normal device function Ptx s/p implant resolved  3.  Chronic diastolic heart failure Worsened by AF  Continue medical therapy   4.  COPD Per primary team  Chanetta Marshall, NP 09/27/2016 8:06 AM  I have seen and examined this patient with Chanetta Marshall.  Agree with above, note added to reflect my findings.  On exam, irregular rhythm, no murmurs, lungs clear. Continued atrial fibrilliaton but rate much improved with some V pacing seen on telemetry. Carlynn Leduc plan to start sotalol for rhythm control. This  should help to control her rate as well.    Artemis Loyal M. Briceson Broadwater MD 09/27/2016 1:26 PM

## 2016-09-28 DIAGNOSIS — B002 Herpesviral gingivostomatitis and pharyngotonsillitis: Secondary | ICD-10-CM

## 2016-09-28 LAB — BASIC METABOLIC PANEL
Anion gap: 7 (ref 5–15)
BUN: 34 mg/dL — ABNORMAL HIGH (ref 6–20)
CALCIUM: 8.4 mg/dL — AB (ref 8.9–10.3)
CO2: 33 mmol/L — ABNORMAL HIGH (ref 22–32)
CREATININE: 1.37 mg/dL — AB (ref 0.44–1.00)
Chloride: 96 mmol/L — ABNORMAL LOW (ref 101–111)
GFR, EST AFRICAN AMERICAN: 41 mL/min — AB (ref >60)
GFR, EST NON AFRICAN AMERICAN: 35 mL/min — AB (ref >60)
Glucose, Bld: 100 mg/dL — ABNORMAL HIGH (ref 65–99)
Potassium: 3.6 mmol/L (ref 3.5–5.1)
Sodium: 136 mmol/L (ref 135–145)

## 2016-09-28 LAB — GLUCOSE, CAPILLARY: GLUCOSE-CAPILLARY: 92 mg/dL (ref 65–99)

## 2016-09-28 LAB — PROTIME-INR
INR: 3.1
PROTHROMBIN TIME: 32.6 s — AB (ref 11.4–15.2)

## 2016-09-28 LAB — MAGNESIUM: Magnesium: 1.9 mg/dL (ref 1.7–2.4)

## 2016-09-28 MED ORDER — DEXTROSE 5 % IV SOLN
600.0000 mg | INTRAVENOUS | Status: DC
  Administered 2016-09-28 – 2016-09-29 (×2): 600 mg via INTRAVENOUS
  Filled 2016-09-28 (×3): qty 12

## 2016-09-28 MED ORDER — ENSURE ENLIVE PO LIQD
237.0000 mL | Freq: Three times a day (TID) | ORAL | Status: DC
  Administered 2016-09-28 – 2016-09-30 (×6): 237 mL via ORAL

## 2016-09-28 MED ORDER — POTASSIUM CHLORIDE CRYS ER 20 MEQ PO TBCR
20.0000 meq | EXTENDED_RELEASE_TABLET | Freq: Every day | ORAL | Status: DC
  Administered 2016-09-28 – 2016-09-30 (×3): 20 meq via ORAL
  Filled 2016-09-28 (×3): qty 1

## 2016-09-28 MED ORDER — MAGIC MOUTHWASH W/LIDOCAINE
5.0000 mL | Freq: Three times a day (TID) | ORAL | Status: DC
  Administered 2016-09-28 – 2016-09-30 (×6): 5 mL via ORAL
  Filled 2016-09-28 (×8): qty 5

## 2016-09-28 NOTE — Care Management (Addendum)
Case Management Note Initial Note started by: Marvetta Gibbons RN, BSN Unit 2W-Case Manager [phone]  Patient Details  Name: Claudia [name] MRN: [id number] Date of Birth: June 24, 1933  Subjective/Objective:   Adm w copd, arrthymia                 Action/Plan: lives w fam, [place]  Expected Discharge Date:                         Expected Discharge Plan:  [place]  In-House Referral:     Discharge planning Services  CM Consult  Post Acute Care Choice:  Durable Medical Equipment, Home Health Choice offered to:  Patient  DME Arranged:  Nebulizer/meds, Nebulizer machine, Walker rolling DME Agency:  [facility]ult [facility] forces logistics/support/administrative officer, [place] Pos:  RN, PT, OT, Nurse's Aide, Social Work [employer] Agency:  [place]  Status of Service:  In process, will continue to follow  If discussed at Long Length of Stay Meetings, dates discussed:  09-28-16  Additional Comments: 1556 09-29-16 Jacqlyn Krauss, RN,BSN [phone] CM did speak with Adult [facility] Pediatric Manager Kim- portable 02 tank delivered to room on 09-29-16. Pt's nebulizer order faxed to [facility] and will deliver to patient's home on 09-29-16. Pt will need to get Rx for nebulizer's  filled at local pharmacy then thereafter [facility] will be able to fill and bill Medicare B. No further needs from CM at this time.    1155 09-29-16 Jacqlyn Krauss, RN,BSN [phone] Additional orders faxed to [facility] in regards to 02 and [place]. CM did ask for 02 tank to be delivered to hospital before d/c. Pt no longer on Tikosyn will be on Sotalol for home. No further needs from CM at this time.     09-28-16 [address], [place] [phone] CM did speak with pt in regards to disposition needs. Plan for Cardioversion on Friday. Plan will be to return home with husband. Pt wants to get DME nebulizer and meds  from [facility]-pt will need Rx before d/c.  RW was delivered to patient by [facility].  Pt has 02 via [facility] qhs. CM will need to verify if pt will need continuous 02. Will continue to monitor.    09/24/16- 1330- Kristi Webster RN,, CM- orders placed for HH-RN/PT/OT/aide/SW- spoke with pt at bedside- choice offered for [facility] services in [facility]- pt states no preference for agency- ok with [facility]- referral called to [facility] with [facility] for [facility] services- pt will also need home nebulizer and RW for home at time of discharge- has home 02 with [facility] services- pt has been started on Tikosyn- has been informed of her copay- will need 7 day supply from [place] on discharge and script for refills sent to her pharmacy to fill.   Lacretia Leigh, RN 09/19/2016, 10:50 AM--pt on Phyllis Ginger. thas 45% copay and needs prior auth. Placed pt assist form on shadow chart. Have spoken w pt and da to alert them of this also. Da will ck w pt's drugstore about exact copay.

## 2016-09-28 NOTE — Progress Notes (Signed)
Initial Nutrition Assessment  DOCUMENTATION CODES:   Not applicable  INTERVENTION:    Ensure Enlive PO TID, each supplement provides 350 kcal and 20 grams of protein  Magic cup TID with meals, each supplement provides 290 kcal and 9 grams of protein  NUTRITION DIAGNOSIS:   Inadequate oral intake related to mouth pain as evidenced by per patient/family report.  GOAL:   Patient will meet greater than or equal to 90% of their needs  MONITOR:   PO intake, Supplement acceptance, Labs  REASON FOR ASSESSMENT:   Consult Assessment of nutrition requirement/status  ASSESSMENT:   83 y.o. female with PMH of paroxysmal atrial fibrillation on warfarin, COPD with chronic hypoxic respiratory failure, chronic diastolic CHF, and hypertension who presents in transfer from [facility] where she presented with 2 weeks of dry cough and dyspnea.  Patient c/o mouth pain due to thrush that is limiting her ability to eat. She reports only able to tolerate Ensure supplements and puddings. She has been drinking Ensure supplements 2-3 times daily.  Meal completion since admission has been variable, more recently 25-50%. Patient agreed to try Magic Cups with meals to maximize oral intake of protein and calories. Nutrition-Focused physical exam completed. Findings are no fat depletion, mild muscle depletion, and mild edema.  Weight stable since admission.  Diet Order:  Diet Heart Room service appropriate? Yes; Fluid consistency: Thin Diet NPO time specified  Skin:  Reviewed, no issues  Last BM:  2/14  Height:   Ht Readings from Last 1 Encounters:  09/18/16 5\' 2"  (1.575 m)    Weight:   Wt Readings from Last 1 Encounters:  09/28/16 132 lb 9.6 oz (60.1 kg)    Ideal Body Weight:  50 kg  BMI:  Body mass index is 24.25 kg/m.  Estimated Nutritional Needs:   Kcal:  1400-1600  Protein:  70-80 gm  Fluid:  >/= 1.5 L  EDUCATION NEEDS:   No education needs identified at  this time  Molli Barrows, [place], [place], [place] Pager [phone] After Hours Pager [phone]

## 2016-09-28 NOTE — Progress Notes (Signed)
ANTICOAGULATION CONSULT NOTE - Follow Up Consult  Pharmacy Consult for warfarin Indication: atrial fibrillation  Allergies  Allergen Reactions  . Augmentin [Amoxicillin-Pot Clavulanate] Shortness Of Breath  . Doxycycline Shortness Of Breath  . Nitrofurantoin Shortness Of Breath  . Acyclovir And Related   . Amiodarone Hcl Nausea And Vomiting  . Clarithromycin Other (See Comments)    Crazy feeling, confusion  . Clonazepam Other (See Comments)    Doesn't remember reaction  . Codeine Nausea And Vomiting  . Diltiazem Hcl Nausea And Vomiting  . Fish Oil Nausea And Vomiting  . Flecainide Nausea Only  . Multaq [Dronedarone] Nausea Only  . Rosuvastatin Other (See Comments)    Leg cramps   . Zetia [Ezetimibe] Other (See Comments)    Leg cramps    Patient Measurements: Height: 5\' 2"  (157.5 cm) Weight: 132 lb 9.6 oz (60.1 kg) IBW/kg (Calculated) : 50.1  Vital Signs: Temp: 99.1 F (37.3 C) (02/15 0447) Temp Source: Oral (02/15 0447) BP: 108/60 (02/15 0447) Pulse Rate: 64 (02/15 0617)  Labs:  Recent Labs  09/26/16 0424 09/27/16 0508 09/28/16 0354  HGB 10.3*  --   --   HCT 33.0*  --   --   PLT 273  --   --   LABPROT 31.9* 32.0* 32.6*  INR 3.01 3.02 3.10  CREATININE 1.34* 1.33* 1.37*    Estimated Creatinine Clearance: 25.5 mL/min (by C-G formula based on SCr of 1.37 mg/dL (H)).  Assessment: 87 YOF admitted for SOB on PTA warfarin for Afib. Pharmacy consulted to dose warfarin for h/o Afib.    INR above goal range at 3.1 this morning. Noted plans for DCCV tomorrow by EP.   Lower dose of warfarin has been given the past few days d/t interaction with fluconazole- last fluconazole dose was given this morning (will still affect INR for a couple days).  Hgb 10.3, plts 273- no bleeding noted.  PTA warfarin dose was 2mg  MWF, 1mg  all other days (dose very stable as outpatient per clinic records.   Goal of Therapy:  INR 2-3 Monitor platelets by anticoagulation protocol:  Yes   Plan:  -Hold warfarin tonight with elevated INR -Daily PT/INR -Monitor for drug interactions and signs/symptoms of bleeding   Lakayla Barrington D. Terrel Nesheiwat, PharmD, BCPS Clinical Pharmacist Pager: [phone] 09/28/2016 11:20 AM

## 2016-09-28 NOTE — Progress Notes (Signed)
SUBJECTIVE: The patient is doing ok this morning. She has significant mouth pain that is limiting her ability to eat. No chest pain or shortness of breath at rest. Did some exercises yesterday with PT  CURRENT MEDICATIONS: . arformoterol  15 mcg Nebulization BID  . budesonide (PULMICORT) nebulizer solution  0.25 mg Nebulization BID  . diltiazem  300 mg Oral BID  . feeding supplement (ENSURE ENLIVE)  237 mL Oral BID BM  . fluconazole  100 mg Oral Daily  . fluticasone  2 spray Each Nare Daily  . ipratropium  0.5 mg Nebulization TID  . levalbuterol  0.63 mg Nebulization TID  . loratadine  10 mg Oral Daily  . mouth rinse  15 mL Mouth Rinse BID  . metoprolol tartrate  25 mg Oral Q6H  . nystatin  5 mL Oral QID  . pantoprazole  40 mg Oral Daily  . polyvinyl alcohol  1 drop Both Eyes TID  . predniSONE  10 mg Oral Q breakfast  . sodium chloride flush  3 mL Intravenous Q12H  . sotalol  80 mg Oral Q12H  . Warfarin - Pharmacist Dosing Inpatient   Does not apply q1800     OBJECTIVE: Physical Exam: Vitals:   09/27/16 2141 09/27/16 2310 09/28/16 0447 09/28/16 0617  BP: 111/62  108/60   Pulse: 94 99 61 64  Resp: (!) 23  17   Temp: 97.7 F (36.5 C)  99.1 F (37.3 C)   TempSrc: Oral  Oral   SpO2: 97%  100%   Weight:   132 lb 9.6 oz (60.1 kg)   Height:        Intake/Output Summary (Last 24 hours) at 09/28/16 0820 Last data filed at 09/27/16 1531  Gross per 24 hour  Intake              474 ml  Output                0 ml  Net              474 ml    Telemetry reveals AF with controlled ventricular response, intermittent V pacing   GEN- The patient is elderly and chronically ill appearing, alert and oriented x 3 today.   Head- normocephalic, atraumatic Eyes-  Sclera clear, conjunctiva pink Ears- hearing intact Oropharynx- clear Neck- supple  Lungs- slightly increased work of breathing Heart- irregular rate and rhythm GI- soft, NT, ND, + BS Extremities- no clubbing,  cyanosis, or edema Skin- no rash or lesion Psych- euthymic mood, full affect Neuro- strength and sensation are intact  LABS: Basic Metabolic Panel:  Recent Labs  09/26/16 0424 09/27/16 0508 09/28/16 0354  NA 135 136 136  K 3.7 3.5 3.6  CL 96* 97* 96*  CO2 30 29 33*  GLUCOSE 104* 95 100*  BUN 25* 30* 34*  CREATININE 1.34* 1.33* 1.37*  CALCIUM 8.8* 8.7* 8.4*  MG 2.0  --  1.9   CBC:  Recent Labs  09/26/16 0424  WBC 16.6*  HGB 10.3*  HCT 33.0*  MCV 88.9  PLT 273    RADIOLOGY: Dg Chest 2 View Result Date: 09/23/2016 CLINICAL DATA:  Hypoxia EXAM: CHEST  2 VIEW COMPARISON:  09/21/2016 FINDINGS: Linear subsegmental atelectasis at the right lung base. There is hyperinflation of the lungs compatible with COPD. Left pacer is unchanged. Heart is normal size. No confluent opacity on the left. No effusions. IMPRESSION: Right basilar subsegmental atelectasis. COPD. Electronically Signed   By:  Rolm Baptise M.D.   On: 09/23/2016 15:11   ASSESSMENT AND PLAN:  1.  Persistent atrial fibrillation with RVR She has failed Tikosyn with persistent atrial fibrillation   Continue Sotalol  Plan DCCV tomorrow  Continue Warfarin for CHADS2VASC of 5. Management per pharmacy  Rates controlled on Metoprolol and Diltiazem  Keep K>3.9, Mg >1.8 Will supplement K today   2.  Sinus node dysfunction S/p PPM Normal device function Ptx s/p implant resolved  3.  Chronic diastolic heart failure Worsened by AF  Continue medical therapy   4.  COPD Per primary team  5.  ?Thrush She hasn't had relief with Nystatin/Fluconazole Will defer management to primary team   Dr Curt Bears to see later today NPO after midnight tonight.   Chanetta Marshall, NP 09/28/2016 8:20 AM   I have seen and examined this patient with Chanetta Marshall.  Agree with above, note added to reflect my findings.  On exam, iRRR, no murmurs, lungs clear. He is to be in atrial fibrillation after sotalol loading started yesterday. Her  heart rate is much better controlled, and she does have at times ventricular pacing. We'll plan for cardioversion tomorrow should she not return to sinus rhythm. QTC remains stable.    Will M. Camnitz MD 09/28/2016 8:43 AM

## 2016-09-28 NOTE — Progress Notes (Signed)
Occupational Therapy Treatment Patient Details Name: Claudia [name] MRN: [id number] DOB: 05/19/1933 Today's Date: 09/28/2016    History of present illness Claudia [name] is a 83 y.o. female with PHMx significant for paroxysmal A-fib, COPD with chronic hypoxic respiratory failure, chronic diastolic CHF, and HTN who presents with 2 weeks of dry cough and dyspnea and hypoxic respiratory failure.. 1/31 to ICU to respiratory distress.2/5 PPM with pneumothorax and CT placement--CT removed 2/7.   OT comments  Pt limited this session by fatigue and SOB with minimal activity. Pt tolerated sitting EOB to complete UB/LB exercises. VSS throughout but increased SOB during exercises. D/c plan remains appropriate. Will continue to follow acutely.   Follow Up Recommendations  Home health OT;Supervision/Assistance - 24 hour    Equipment Recommendations  3 in 1 bedside commode    Recommendations for Other Services      Precautions / Restrictions Precautions Precautions: ICD/Pacemaker;Fall Precaution Comments: monitor O2 and HR; recent pacemaker (2/5); pt reports she is typically on 2L O2 at night Restrictions Weight Bearing Restrictions: No       Mobility Bed Mobility Overal bed mobility: Needs Assistance Bed Mobility: Supine to Sit;Sit to Supine     Supine to sit: Min assist;HOB elevated Sit to supine: Min guard   General bed mobility comments: Light min assist for trunk elevation. Increased time required  Transfers                      Balance Overall balance assessment: Needs assistance Sitting-balance support: Feet supported;No upper extremity supported Sitting balance-Leahy Scale: Good                             ADL Overall ADL's : Needs assistance/impaired                                       General ADL Comments: Pt declining participation in ADL or functional mobility at this time. Agreeable to perform UB/LB exercises sitting EOB.       Vision                     Perception     Praxis      Cognition   Behavior During Therapy: Flat affect ("tired") Overall Cognitive Status: Within Functional Limits for tasks assessed                       Extremity/Trunk Assessment               Exercises General Exercises - Upper Extremity Shoulder Flexion: AROM;Right;10 reps;Seated Shoulder ABduction: AROM;Right;10 reps;Seated Elbow Flexion: AROM;Both;10 reps;Seated General Exercises - Lower Extremity Ankle Circles/Pumps: AROM;Both;10 reps;Seated Hip Flexion/Marching: AROM;Both;10 reps;Seated Other Exercises Other Exercises: Knee extension (kicking), 10 reps, bil, seated   Shoulder Instructions       General Comments      Pertinent Vitals/ Pain       Pain Assessment: Faces Faces Pain Scale: Hurts little more Pain Location: mouth Pain Descriptors / Indicators: Sore Pain Intervention(s): Monitored during session  Home Living                                          Prior Functioning/Environment  Frequency  Min 2X/week        Progress Toward Goals  OT Goals(current goals can now be found in the care plan section)  Progress towards OT goals: Not progressing toward goals - comment (limited by fatigue)  Acute Rehab OT Goals Patient Stated Goal: to be able to go home and get [facility] therapies OT Goal Formulation: With patient  Plan Discharge plan remains appropriate    Co-evaluation                 End of Session     Activity Tolerance Patient limited by fatigue   Patient Left in bed;with call bell/phone within reach   Nurse Communication Mobility status        Time: [id number] OT Time Calculation (min): 12 min  Charges: OT General Charges $OT Visit: 1 Procedure OT Treatments $Therapeutic Exercise: 8-22 mins  Binnie Kand M.S., OTR/L Pager: [phone]  09/28/2016, 10:51 AM

## 2016-09-28 NOTE — Progress Notes (Signed)
Physical Therapy Treatment Patient Details Name: Claudia [name] MRN: [id number] DOB: [birth year] Today's Date: 09/28/2016    History of Present Illness Claudia [name] is a 83 y.o. female with PHMx significant for paroxysmal A-fib, COPD with chronic hypoxic respiratory failure, chronic diastolic CHF, and HTN who presents with 2 weeks of dry cough and dyspnea and hypoxic respiratory failure.. 1/31 to ICU to respiratory distress.2/5 PPM with pneumothorax and CT placement--CT removed 2/7.    PT Comments    Pt with reports of fatigue today, agreeable to bed level exercises only. Pt's HR ranged between 70-85 with exercises today. Pt is making slow progress with mobility. Acute PT to continue during pt's hospital stay.  Follow Up Recommendations  Home health PT;Supervision/Assistance - 24 hour     Equipment Recommendations  Rolling walker with 5" wheels;3in1 (PT)    Precautions / Restrictions Precautions Precautions: ICD/Pacemaker;Fall Precaution Comments: monitor O2 and HR; recent pacemaker (2/5); pt reports she is typically on 2L O2 at night Restrictions Weight Bearing Restrictions: No LUE Weight Bearing: Touch down weight bearing Other Position/Activity Restrictions: pacer       Cognition Arousal/Alertness: Awake/alert Behavior During Therapy: Flat affect;WFL for tasks assessed/performed ("tired" per pt report, kept EC during session today) Overall Cognitive Status: Within Functional Limits for tasks assessed          Exercises General Exercises - Upper Extremity Shoulder Flexion: AROM;Right;10 reps;Seated Shoulder ABduction: AROM;Right;10 reps;Seated Elbow Flexion: AROM;Both;10 reps;Seated General Exercises - Lower Extremity Ankle Circles/Pumps: AROM;10 reps;Supine;Both Quad Sets: AROM;Strengthening;10 reps;Supine;Both Short Arc Quad: AROM;Strengthening;Both;10 reps;Supine Straight Leg Raises: AAROM;Strengthening;Both;10 reps;Supine Hip Flexion/Marching: AROM;Both;10  reps;Seated Other Exercises Other Exercises: Knee extension (kicking), 10 reps, bil, seated     Pertinent Vitals/Pain Pain Assessment: 0-10 Pain Score: 5  Faces Pain Scale: Hurts little more Pain Location: mouth Pain Descriptors / Indicators: Sore Pain Intervention(s): Monitored during session;Premedicated before session;Patient requesting pain meds-RN notified     PT Goals (current goals can now be found in the care plan section) Acute Rehab PT Goals Patient Stated Goal: to be able to go home and get [facility] therapies PT Goal Formulation: With patient/family Time For Goal Achievement: 09/29/16 Potential to Achieve Goals: Good Progress towards PT goals: Progressing toward goals    Frequency    Min 3X/week      PT Plan Current plan remains appropriate    End of Session Equipment Utilized During Treatment: Oxygen Activity Tolerance: Patient tolerated treatment well;No increased pain;Patient limited by fatigue Patient left: in bed;with call bell/phone within reach     Time: 1127-1140 PT Time Calculation (min) (ACUTE ONLY): 13 min  Charges:  $Therapeutic Exercise: 8-22 mins           Willow Ora 09/28/2016, 11:47 AM   Willow Ora, PTA, CLT Acute Rehab Services [phone] 09/28/16, 11:50 AM

## 2016-09-28 NOTE — Progress Notes (Signed)
Pharmacy Antibiotic Note  Claudia [name] is a 83 y.o. female admitted on 09/09/2016 with oral infection.  Pharmacy has been consulted for acyclovir dosing.  Plan: Acyclovir 600 mg iv Q 24 hours Continue to follow  Thank you Anette Guarneri, PharmD [phone]  Height: 5\' 2"  (157.5 cm) Weight: 132 lb 9.6 oz (60.1 kg) IBW/kg (Calculated) : 50.1  Temp (24hrs), Avg:98.2 F (36.8 C), Min:97.7 F (36.5 C), Max:99.1 F (37.3 C)   Recent Labs Lab 09/22/16 0305 09/23/16 0312 09/24/16 1104 09/25/16 0742 09/26/16 0424 09/27/16 0508 09/28/16 0354  WBC 18.0* 14.6*  --  16.8* 16.6*  --   --   CREATININE 1.33* 1.21* 1.48* 1.18* 1.34* 1.33* 1.37*    Estimated Creatinine Clearance: 25.5 mL/min (by C-G formula based on SCr of 1.37 mg/dL (H)).    Allergies  Allergen Reactions  . Augmentin [Amoxicillin-Pot Clavulanate] Shortness Of Breath  . Doxycycline Shortness Of Breath  . Nitrofurantoin Shortness Of Breath  . Acyclovir And Related   . Amiodarone Hcl Nausea And Vomiting  . Clarithromycin Other (See Comments)    Crazy feeling, confusion  . Clonazepam Other (See Comments)    Doesn't remember reaction  . Codeine Nausea And Vomiting  . Diltiazem Hcl Nausea And Vomiting  . Fish Oil Nausea And Vomiting  . Flecainide Nausea Only  . Multaq [Dronedarone] Nausea Only  . Rosuvastatin Other (See Comments)    Leg cramps   . Zetia [Ezetimibe] Other (See Comments)    Leg cramps    Thank you Anette Guarneri, PharmD [phone] 09/28/2016 4:33 PM

## 2016-09-28 NOTE — Progress Notes (Signed)
QTc=557 on EKG at 0826.  Spoke to Dr Curt Bears.  He stated it was ok to give AM dose of Sotolol.

## 2016-09-28 NOTE — Progress Notes (Signed)
PROGRESS NOTE    Claudia [name]  [id number] DOB: 1933-07-08 DOA: 09/09/2016 PCP: Pcp Not In System   Brief Narrative: 83yo F admitted 1/27 with dyspnea, cough and hypoxia thought related to AECOPD. Found to be in AF with RVR 1/28. On 2/5 left pnx post PPM insertion, chest tube was removed with resolution of pneumothorax. Patient going in and out of age of fibrillation with cardiology managing her medications for better rate control.  Assessment & Plan:  # Atrial fibrillation with RVR/tachycardia bradycardia syndrome: Patient with fluctuation in heart rate and followed by cardiologist. -Added sotalol by cardiologist. Patient already on diltiazem and metoprolol. Plan for cardioversion tomorrow by cardiologist. -On Coumadin for systemic anticoagulation -Monitor heart rate and INR closely. INR 3.1 today. Holding Coumadin today as per pharmacist -Cardiac medications adjusted by cardiologist.  #Acute on chronic hypoxic respiratory failure due to acute COPD exacerbation: -Patient is clinically improved. No shortness of breath now. Continue current bronchodilators. Plan to taper her steroids. Last dose of prednisone on February 17. -Patient uses about 2 L of oxygen at night at home.  #Left pneumothorax post PPM insertion: Chest from has been removed. Patient is clinically stable.  #Acute delirium in the hospital likely due to steroid: Patient's mental status is stable today. Continue supportive care.  #Chronic congestive heart failure: Echo on 1/31 showed EF of 60% with grade 2 diastolic dysfunction. Patient is euvolemic on exam.  #Oral lesions, treated with nystatin and fluconazole for possible oral thrush. No improvement in her oral rashes. Patient has blisters in her tongue which are painful concerning for herpes infection. Patient is immunocompromised in the setting of steroids use. I ordered oral Magic mouth. I discussed with infectious disease Dr. Lucianne Lei dam regarding the rash. Starting  acyclovir IV. Ordered herpes viral PCR from the oral mucosa. Continue supportive care.  #Acute kidney injury likely hemodynamically mediated in the setting of tachycardia. Serum creatinine is stable at 1.3 today. Continue to monitor.  Principal Problem:   Acute on chronic respiratory failure (HCC) Active Problems:   Long term current use of anticoagulant therapy   Paroxysmal atrial fibrillation (HCC)   HTN (hypertension)   COPD exacerbation (HCC)   Chronic diastolic CHF (congestive heart failure) (HCC)   COPD with acute exacerbation (HCC)   Hypoxia   Respiratory distress   Tachy-brady syndrome (HCC)   Chronic atrial fibrillation (HCC)   Acute respiratory failure with hypoxemia (HCC)   Pneumothorax, traumatic   Acute delirium   Cardiac pacemaker in situ   S/P chest tube placement (HCC)   Leukocytosis   Oral thrush  DVT prophylaxis: Systemic anticoagulation Code Status: DO NOT RESUSCITATE Family Communication: No family present at bedside Disposition Plan: Home with home care in 1-2 days. Depending on cardiologist planned.  Consultants:   Cardiology: Dr. Debara Pickett 09/12/2016  Electrophysiology Dr.Camintz 09/13/2016  PCCM Dr. Nelda Marseille 09/13/2016  Infectious disease, Dr. Tommy Medal 2/15.  Procedures:  2-D echo 09/13/2016  Pacemaker implantation per Dr. Baird Kay 09/18/2016  SIGNIFICANT EVENTS: 1/27 Admitted with dyspnea and hypoxic respiratory failure 1/28 AFib with RVR, responded to metoprolol, sotalol 1/30 Bradycardia in 30s, then AFib with RVR in 150s 1/31 Transferred to ICU due to respiratory distress, tachy-brady 2/03 Started on CCB gtt 2/04 Rate controlled.  2/5 pnx fromm PPM placed 2/5 2/7 dc chest tube  LINES/TUBES: PIV 1/27 2/5 left chest tube>> 09/20/2016  Antimicrobials:  Oral Levaquin 09/09/2016>>>>> 09/12/2016  IV vancomycin 09/18/2016 1 Subjective: Patient was seen and examined at bedside. No new event. Denied  nausea vomiting, chest pain,  shortness of breath, abdominal pain. Patient reported oral pain and difficulty eating.  Objective: Vitals:   09/28/16 0617 09/28/16 0836 09/28/16 1332 09/28/16 1453  BP:    (!) 106/51  Pulse: 64   60  Resp:    (!) 25  Temp:    97.9 F (36.6 C)  TempSrc:    Oral  SpO2:  99% 99% 99%  Weight:      Height:       No intake or output data in the 24 hours ending 09/28/16 1618 Filed Weights   09/26/16 0411 09/27/16 0437 09/28/16 0447  Weight: 61.6 kg (135 lb 11.2 oz) 59.9 kg (132 lb) 60.1 kg (132 lb 9.6 oz)    Examination:  General exam: Appears calm and comfortable  HEENT: Oral mucosa has multiple blisters mostly in the tongue. Respiratory system: Clear bilateral, respiratory effort normal.  Cardiovascular system: Irregularly irregular heart rate, S1 and S2 normal. No pedal edema. Gastrointestinal system: Abdomen is nondistended, soft and nontender. Normal bowel sounds heard. Central nervous system: Alert and oriented. No focal neurological deficits. Extremities: Symmetric 5 x 5 power. Skin: No rashes, lesions or ulcers Psychiatry: Judgement and insight appear normal. Mood & affect appropriate.     Data Reviewed: I have personally reviewed following labs and imaging studies  CBC:  Recent Labs Lab 09/22/16 0305 09/23/16 0312 09/25/16 0742 09/26/16 0424  WBC 18.0* 14.6* 16.8* 16.6*  NEUTROABS 15.7* 12.1*  --   --   HGB 10.2* 9.6* 10.4* 10.3*  HCT 32.5* 30.6* 33.1* 33.0*  MCV 88.1 88.2 88.7 88.9  PLT 186 188 [id number]   Basic Metabolic Panel:  Recent Labs Lab 09/23/16 0312 09/24/16 1104 09/25/16 0742 09/26/16 0424 09/27/16 0508 09/28/16 0354  NA 134* 136 137 135 136 136  K 4.5 4.0 3.9 3.7 3.5 3.6  CL 92* 96* 97* 96* 97* 96*  CO2 33* 31 28 30 29  33*  GLUCOSE 99 123* 94 104* 95 100*  BUN 35* 33* 28* 25* 30* 34*  CREATININE 1.21* 1.48* 1.18* 1.34* 1.33* 1.37*  CALCIUM 8.5* 9.2 8.8* 8.8* 8.7* 8.4*  MG 2.1  --  1.9 2.0  --  1.9   GFR: Estimated Creatinine  Clearance: 25.5 mL/min (by C-G formula based on SCr of 1.37 mg/dL (H)). Liver Function Tests: No results for input(s): AST, ALT, ALKPHOS, BILITOT, PROT, ALBUMIN in the last 168 hours. No results for input(s): LIPASE, AMYLASE in the last 168 hours. No results for input(s): AMMONIA in the last 168 hours. Coagulation Profile:  Recent Labs Lab 09/24/16 1245 09/25/16 0742 09/26/16 0424 09/27/16 0508 09/28/16 0354  INR 2.48 2.74 3.01 3.02 3.10   Cardiac Enzymes: No results for input(s): CKTOTAL, CKMB, CKMBINDEX, TROPONINI in the last 168 hours. BNP (last 3 results) No results for input(s): PROBNP in the last 8760 hours. HbA1C: No results for input(s): HGBA1C in the last 72 hours. CBG:  Recent Labs Lab 09/22/16 0845 09/23/16 0736 09/27/16 0816 09/27/16 2132 09/28/16 0444  GLUCAP 91 90 92 109* 92   Lipid Profile: No results for input(s): CHOL, HDL, LDLCALC, TRIG, CHOLHDL, LDLDIRECT in the last 72 hours. Thyroid Function Tests: No results for input(s): TSH, T4TOTAL, FREET4, T3FREE, THYROIDAB in the last 72 hours. Anemia Panel: No results for input(s): VITAMINB12, FOLATE, FERRITIN, TIBC, IRON, RETICCTPCT in the last 72 hours. Sepsis Labs: No results for input(s): PROCALCITON, LATICACIDVEN in the last 168 hours.  Recent Results (from the past 240 hour(s))  Culture, blood (  Routine X 2) w Reflex to ID Panel     Status: None   Collection Time: 09/21/16  8:45 AM  Result Value Ref Range Status   Specimen Description BLOOD LEFT ANTECUBITAL  Final   Special Requests IN PEDIATRIC BOTTLE 3CC  Final   Culture NO GROWTH 5 DAYS  Final   Report Status 09/26/2016 FINAL  Final  Culture, blood (Routine X 2) w Reflex to ID Panel     Status: None   Collection Time: 09/21/16  8:55 AM  Result Value Ref Range Status   Specimen Description BLOOD LEFT HAND  Final   Special Requests IN PEDIATRIC BOTTLE 3CC  Final   Culture NO GROWTH 5 DAYS  Final   Report Status 09/26/2016 FINAL  Final  Urine  culture     Status: None   Collection Time: 09/22/16 12:45 PM  Result Value Ref Range Status   Specimen Description URINE, RANDOM  Final   Special Requests NONE  Final   Culture NO GROWTH  Final   Report Status 09/23/2016 FINAL  Final         Radiology Studies: No results found.      Scheduled Meds: . arformoterol  15 mcg Nebulization BID  . budesonide (PULMICORT) nebulizer solution  0.25 mg Nebulization BID  . diltiazem  300 mg Oral BID  . feeding supplement (ENSURE ENLIVE)  237 mL Oral TID BM  . fluticasone  2 spray Each Nare Daily  . ipratropium  0.5 mg Nebulization TID  . levalbuterol  0.63 mg Nebulization TID  . loratadine  10 mg Oral Daily  . magic mouthwash w/lidocaine  5 mL Oral TID  . mouth rinse  15 mL Mouth Rinse BID  . metoprolol tartrate  25 mg Oral Q6H  . nystatin  5 mL Oral QID  . pantoprazole  40 mg Oral Daily  . polyvinyl alcohol  1 drop Both Eyes TID  . potassium chloride  20 mEq Oral Daily  . predniSONE  10 mg Oral Q breakfast  . sodium chloride flush  3 mL Intravenous Q12H  . sotalol  80 mg Oral Q12H  . Warfarin - Pharmacist Dosing Inpatient   Does not apply q1800   Continuous Infusions:   LOS: 18 days    Dron Tanna Furry, MD [facility] Pager [phone]  If 7PM-7AM, please contact night-coverage [email or web address] Password [facility] 09/28/2016, 4:18 PM

## 2016-09-28 NOTE — Care Management Important Message (Signed)
Important Message  Patient Details  Name: Claudia [name] MRN: [id number] Date of Birth: 12-22-1932   Medicare Important Message Given:  Yes    Claudia [name] [name] 09/28/2016, 12:00 PM

## 2016-09-29 ENCOUNTER — Telehealth: Payer: Self-pay

## 2016-09-29 ENCOUNTER — Encounter (HOSPITAL_COMMUNITY)
Admission: EM | Disposition: A | Discharge status: Home Health | Payer: Self-pay | Source: Home / Self Care | Attending: Internal Medicine

## 2016-09-29 DIAGNOSIS — Z808 Family history of malignant neoplasm of other organs or systems: Secondary | ICD-10-CM

## 2016-09-29 DIAGNOSIS — Z888 Allergy status to other drugs, medicaments and biological substances status: Secondary | ICD-10-CM

## 2016-09-29 DIAGNOSIS — Z803 Family history of malignant neoplasm of breast: Secondary | ICD-10-CM

## 2016-09-29 DIAGNOSIS — Z885 Allergy status to narcotic agent status: Secondary | ICD-10-CM

## 2016-09-29 DIAGNOSIS — J449 Chronic obstructive pulmonary disease, unspecified: Secondary | ICD-10-CM

## 2016-09-29 DIAGNOSIS — Z8349 Family history of other endocrine, nutritional and metabolic diseases: Secondary | ICD-10-CM

## 2016-09-29 DIAGNOSIS — Z87891 Personal history of nicotine dependence: Secondary | ICD-10-CM

## 2016-09-29 DIAGNOSIS — Z91048 Other nonmedicinal substance allergy status: Secondary | ICD-10-CM

## 2016-09-29 DIAGNOSIS — Z8249 Family history of ischemic heart disease and other diseases of the circulatory system: Secondary | ICD-10-CM

## 2016-09-29 DIAGNOSIS — Z881 Allergy status to other antibiotic agents status: Secondary | ICD-10-CM

## 2016-09-29 DIAGNOSIS — Z801 Family history of malignant neoplasm of trachea, bronchus and lung: Secondary | ICD-10-CM

## 2016-09-29 DIAGNOSIS — Z820 Family history of epilepsy and other diseases of the nervous system: Secondary | ICD-10-CM

## 2016-09-29 LAB — BASIC METABOLIC PANEL
Anion gap: 7 (ref 5–15)
BUN: 35 mg/dL — ABNORMAL HIGH (ref 6–20)
CALCIUM: 8.9 mg/dL (ref 8.9–10.3)
CO2: 32 mmol/L (ref 22–32)
CREATININE: 1.18 mg/dL — AB (ref 0.44–1.00)
Chloride: 97 mmol/L — ABNORMAL LOW (ref 101–111)
GFR, EST AFRICAN AMERICAN: 49 mL/min — AB (ref >60)
GFR, EST NON AFRICAN AMERICAN: 42 mL/min — AB (ref >60)
Glucose, Bld: 102 mg/dL — ABNORMAL HIGH (ref 65–99)
Potassium: 3.9 mmol/L (ref 3.5–5.1)
SODIUM: 136 mmol/L (ref 135–145)

## 2016-09-29 LAB — PROTIME-INR
INR: 3.43
PROTHROMBIN TIME: 35.4 s — AB (ref 11.4–15.2)

## 2016-09-29 LAB — GLUCOSE, CAPILLARY: Glucose-Capillary: 103 mg/dL — ABNORMAL HIGH (ref 65–99)

## 2016-09-29 LAB — MAGNESIUM: Magnesium: 1.9 mg/dL (ref 1.7–2.4)

## 2016-09-29 SURGERY — CARDIOVERSION
Anesthesia: Monitor Anesthesia Care

## 2016-09-29 MED ORDER — POTASSIUM CHLORIDE CRYS ER 20 MEQ PO TBCR
20.0000 meq | EXTENDED_RELEASE_TABLET | Freq: Once | ORAL | Status: AC
  Administered 2016-09-29: 20 meq via ORAL
  Filled 2016-09-29: qty 1

## 2016-09-29 MED ORDER — BUDESONIDE 0.25 MG/2ML IN SUSP: 0.2500 mg | Freq: Two times a day (BID) | RESPIRATORY_TRACT | 0 refills | Status: DC

## 2016-09-29 MED ORDER — ARFORMOTEROL TARTRATE 15 MCG/2ML IN NEBU: 15.0000 ug | INHALATION_SOLUTION | Freq: Two times a day (BID) | RESPIRATORY_TRACT | 0 refills | Status: DC

## 2016-09-29 NOTE — Telephone Encounter (Signed)
Spoke with Maudie Mercury with [facility], who states pt is currently admitted and will be discharged today. Maudie Mercury is requesting that we send in a Rx for neb machine and medication.  BQ would you be willing to do this, as CY is out of office? Thanks

## 2016-09-29 NOTE — Progress Notes (Signed)
SATURATION QUALIFICATIONS: (This note is used to comply with regulatory documentation for home oxygen)  Patient Saturations on Room Air at Rest = 87%  Patient Saturations on Room Air while sitting on edge of bed = 85%  Patient Saturations on 2 Liters of oxygen while sitting on edge of bed = 94%  Please briefly explain why patient needs home oxygen:

## 2016-09-29 NOTE — Progress Notes (Signed)
ANTICOAGULATION and ANTIMICROBIAL CONSULT NOTE - Follow Up Consult  Pharmacy Consult for warfarin and acyclovir Indication: atrial fibrillation and oral HSV infection  Allergies  Allergen Reactions  . Augmentin [Amoxicillin-Pot Clavulanate] Shortness Of Breath  . Doxycycline Shortness Of Breath  . Nitrofurantoin Shortness Of Breath  . Acyclovir And Related   . Amiodarone Hcl Nausea And Vomiting  . Clarithromycin Other (See Comments)    Crazy feeling, confusion  . Clonazepam Other (See Comments)    Doesn't remember reaction  . Codeine Nausea And Vomiting  . Diltiazem Hcl Nausea And Vomiting  . Fish Oil Nausea And Vomiting  . Flecainide Nausea Only  . Multaq [Dronedarone] Nausea Only  . Rosuvastatin Other (See Comments)    Leg cramps   . Zetia [Ezetimibe] Other (See Comments)    Leg cramps    Patient Measurements: Height: 5\' 2"  (157.5 cm) Weight: 133 lb (60.3 kg) IBW/kg (Calculated) : 50.1  Vital Signs: Temp: 98.2 F (36.8 C) (02/16 0434) Temp Source: Axillary (02/16 0434) BP: 109/51 (02/16 0609) Pulse Rate: 60 (02/16 0609)  Labs:  Recent Labs  09/27/16 0508 09/28/16 0354 09/29/16 0257 09/29/16 0826  LABPROT 32.0* 32.6* 35.4*  --   INR 3.02 3.10 3.43  --   CREATININE 1.33* 1.37*  --  1.18*    Estimated Creatinine Clearance: 32 mL/min (by C-G formula based on SCr of 1.18 mg/dL (H)).  Assessment: 59 YOF admitted for SOB on PTA warfarin for Afib. Pharmacy consulted to dose warfarin for h/o Afib.    INR above goal range at 3.43 this morning. Noted DCCV has been cancelled and likely to discharge tomorrow per EP. Hgb 10.3, plts 273- no bleeding noted.  PTA warfarin dose was 2mg  MWF, 1mg  all other days (dose very stable as outpatient per clinic records).  On Acyclovir IV for HSV oral infection.   Goal of Therapy:  INR 2-3 Monitor platelets by anticoagulation protocol: Yes   Plan:  -Hold warfarin tonight with elevated INR -Recommend resuming home dose on  Saturday 2/17 (1mg  daily except 2mg  on MWF) with an INR check on Monday 2/19 or Tuesday 2/20 -Daily PT/INR while in the hospital -Continues on acyclovir 600mg  IV q24h while inpatient- recommend changing to 200mg  PO TID for a total of 5 days of treatment at discharge -Monitor for drug interactions and signs/symptoms of bleeding   Tineka Uriegas D. Kenichi Cassada, PharmD, BCPS Clinical Pharmacist Pager: [phone] 09/29/2016 10:10 AM

## 2016-09-29 NOTE — Telephone Encounter (Signed)
OK to send.

## 2016-09-29 NOTE — Progress Notes (Signed)
PROGRESS NOTE    Claudia [name]  [id number] DOB: 10/16/1932 DOA: 09/09/2016 PCP: Pcp Not In System   Brief Narrative: 83yo F admitted 1/27 with dyspnea, cough and hypoxia thought related to AECOPD. Found to be in AF with RVR 1/28. On 2/5 left pnx post PPM insertion, chest tube was removed with resolution of pneumothorax. Patient going in and out of age of fibrillation with cardiology managing her medications for better rate control.  Assessment & Plan:  # Atrial fibrillation with RVR/tachycardia bradycardia syndrome: Patient with fluctuation in heart rate and followed by cardiologist. -Added sotalol by cardiologist. Patient already on diltiazem and metoprolol. Converted to sinus rhythm and therefore cardioversion was deferred by cardiologist. Plan to continue sotalol and monitor daily interval. Likely discharge to home tomorrow if QT interval remains stable, as per cardiologist note from today. -On Coumadin for systemic anticoagulation -Monitor heart rate and INR closely. INR 3.4 today. Holding Coumadin today as per pharmacist -Cardiac medications adjusted by cardiologist.  #Acute on chronic hypoxic respiratory failure due to acute COPD exacerbation: -Patient is clinically improved. No shortness of breath now. Continue current bronchodilators. Completed prednisone taper. -Patient remained hypoxic in room air this morning therefore she needs to go home with oxygen continuous. She uses only 2 L of oxygen at night before current admission.  #Left pneumothorax post PPM insertion: Chest from has been removed. Patient is clinically stable.  #Acute delirium in the hospital likely due to steroid: Patient's mental status is stable today. Continue supportive care.  #Chronic congestive heart failure: Echo on 1/31 showed EF of 60% with grade 2 diastolic dysfunction. Patient is euvolemic on exam.  #Oral lesions, treated with nystatin and fluconazole for possible oral thrush. No improvement in her  oral rashes. Patient has blisters in her tongue which are painful likely due to oral herpes infection. Patient is immunocompromised in the setting of steroids use. -Already clinically improving with Magic mouth and acyclovir. Continue acyclovir on discharge.  I ordered oral Magic mouth. I discussed with infectious disease Dr. Lucianne Lei dam regarding the rash. Starting acyclovir IV. -Pending herpes viral PCR from the oral mucosa. Continue supportive care. -Encourage oral intake.  #Acute kidney injury likely hemodynamically mediated in the setting of tachycardia. Serum creatinine improved to 1.1 today.  Principal Problem:   Acute on chronic respiratory failure (HCC) Active Problems:   Long term current use of anticoagulant therapy   Paroxysmal atrial fibrillation (HCC)   HTN (hypertension)   COPD exacerbation (HCC)   Chronic diastolic CHF (congestive heart failure) (HCC)   COPD with acute exacerbation (HCC)   Hypoxia   Respiratory distress   Tachy-brady syndrome (HCC)   Chronic atrial fibrillation (HCC)   Acute respiratory failure with hypoxemia (HCC)   Pneumothorax, traumatic   Acute delirium   Cardiac pacemaker in situ   S/P chest tube placement (HCC)   Leukocytosis   Oral thrush   Oral herpes simplex infection  DVT prophylaxis: Systemic anticoagulation Code Status: DO NOT RESUSCITATE Family Communication: Discussed with the patient's daughter at the bedside Disposition Plan: Home with home care in 1-2 days. Depending on cardiologist plann.  Consultants:   Cardiology: Dr. Debara Pickett 09/12/2016  Electrophysiology Dr.Camintz 09/13/2016  PCCM Dr. Nelda Marseille 09/13/2016  Infectious disease, Dr. Tommy Medal 2/15.  Procedures:  2-D echo 09/13/2016  Pacemaker implantation per Dr. Baird Kay 09/18/2016  SIGNIFICANT EVENTS: 1/27 Admitted with dyspnea and hypoxic respiratory failure 1/28 AFib with RVR, responded to metoprolol, sotalol 1/30 Bradycardia in 30s, then AFib with RVR in 150s  1/31  Transferred to ICU due to respiratory distress, tachy-brady 2/03 Started on CCB gtt 2/04 Rate controlled.  2/5 pnx fromm PPM placed 2/5 2/7 dc chest tube  LINES/TUBES: PIV 1/27 2/5 left chest tube>> 09/20/2016  Antimicrobials:  Oral Levaquin 09/09/2016>>>>> 09/12/2016  IV vancomycin 09/18/2016 1 Subjective: Patient was seen and examined at bedside. Patient converted her rhythm last night as per cardiologist. Reported that her oral pain is better today. Denied nausea vomiting chest pain or shortness of breath. Daughter at bedside  Objective: Vitals:   09/29/16 0434 09/29/16 0609 09/29/16 0741 09/29/16 1220  BP: (!) 106/51 (!) 109/51  (!) 113/52  Pulse: 60 60  74  Resp: (!) 22     Temp: 98.2 F (36.8 C)     TempSrc: Axillary     SpO2: 100%  99%   Weight: 60.3 kg (133 lb)     Height:        Intake/Output Summary (Last 24 hours) at 09/29/16 1337 Last data filed at 09/29/16 0300  Gross per 24 hour  Intake              349 ml  Output                0 ml  Net              349 ml   Filed Weights   09/27/16 0437 09/28/16 0447 09/29/16 0434  Weight: 59.9 kg (132 lb) 60.1 kg (132 lb 9.6 oz) 60.3 kg (133 lb)    Examination:  General exam: Appears calm and comfortable  HEENT: Oral mucosa and tongue blisters are improving. Respiratory system: Clear bilateral, respiratory effort normal.  Cardiovascular system: Regular heart rate, S1 and S2 normal. No pedal edema. Gastrointestinal system: Abdomen is nondistended, soft and nontender. Normal bowel sounds heard. Central nervous system: Alert and oriented. No focal neurological deficits. Extremities: Symmetric 5 x 5 power. Skin: No rashes, lesions or ulcers Psychiatry: Judgement and insight appear normal. Mood & affect appropriate.     Data Reviewed: I have personally reviewed following labs and imaging studies  CBC:  Recent Labs Lab 09/23/16 0312 09/25/16 0742 09/26/16 0424  WBC 14.6* 16.8* 16.6*  NEUTROABS 12.1*   --   --   HGB 9.6* 10.4* 10.3*  HCT 30.6* 33.1* 33.0*  MCV 88.2 88.7 88.9  PLT 188 [id number]   Basic Metabolic Panel:  Recent Labs Lab 09/23/16 0312  09/25/16 0742 09/26/16 0424 09/27/16 0508 09/28/16 0354 09/29/16 0826  NA 134*  < > 137 135 136 136 136  K 4.5  < > 3.9 3.7 3.5 3.6 3.9  CL 92*  < > 97* 96* 97* 96* 97*  CO2 33*  < > 28 30 29  33* 32  GLUCOSE 99  < > 94 104* 95 100* 102*  BUN 35*  < > 28* 25* 30* 34* 35*  CREATININE 1.21*  < > 1.18* 1.34* 1.33* 1.37* 1.18*  CALCIUM 8.5*  < > 8.8* 8.8* 8.7* 8.4* 8.9  MG 2.1  --  1.9 2.0  --  1.9 1.9  < > = values in this interval not displayed. GFR: Estimated Creatinine Clearance: 32 mL/min (by C-G formula based on SCr of 1.18 mg/dL (H)). Liver Function Tests: No results for input(s): AST, ALT, ALKPHOS, BILITOT, PROT, ALBUMIN in the last 168 hours. No results for input(s): LIPASE, AMYLASE in the last 168 hours. No results for input(s): AMMONIA in the last 168 hours. Coagulation Profile:  Recent Labs  Lab 09/25/16 0742 09/26/16 0424 09/27/16 0508 09/28/16 0354 09/29/16 0257  INR 2.74 3.01 3.02 3.10 3.43   Cardiac Enzymes: No results for input(s): CKTOTAL, CKMB, CKMBINDEX, TROPONINI in the last 168 hours. BNP (last 3 results) No results for input(s): PROBNP in the last 8760 hours. HbA1C: No results for input(s): HGBA1C in the last 72 hours. CBG:  Recent Labs Lab 09/23/16 0736 09/27/16 0816 09/27/16 2132 09/28/16 0444 09/29/16 0722  GLUCAP 90 92 109* 92 103*   Lipid Profile: No results for input(s): CHOL, HDL, LDLCALC, TRIG, CHOLHDL, LDLDIRECT in the last 72 hours. Thyroid Function Tests: No results for input(s): TSH, T4TOTAL, FREET4, T3FREE, THYROIDAB in the last 72 hours. Anemia Panel: No results for input(s): VITAMINB12, FOLATE, FERRITIN, TIBC, IRON, RETICCTPCT in the last 72 hours. Sepsis Labs: No results for input(s): PROCALCITON, LATICACIDVEN in the last 168 hours.  Recent Results (from the past 240  hour(s))  Culture, blood (Routine X 2) w Reflex to ID Panel     Status: None   Collection Time: 09/21/16  8:45 AM  Result Value Ref Range Status   Specimen Description BLOOD LEFT ANTECUBITAL  Final   Special Requests IN PEDIATRIC BOTTLE 3CC  Final   Culture NO GROWTH 5 DAYS  Final   Report Status 09/26/2016 FINAL  Final  Culture, blood (Routine X 2) w Reflex to ID Panel     Status: None   Collection Time: 09/21/16  8:55 AM  Result Value Ref Range Status   Specimen Description BLOOD LEFT HAND  Final   Special Requests IN PEDIATRIC BOTTLE 3CC  Final   Culture NO GROWTH 5 DAYS  Final   Report Status 09/26/2016 FINAL  Final  Urine culture     Status: None   Collection Time: 09/22/16 12:45 PM  Result Value Ref Range Status   Specimen Description URINE, RANDOM  Final   Special Requests NONE  Final   Culture NO GROWTH  Final   Report Status 09/23/2016 FINAL  Final         Radiology Studies: No results found.      Scheduled Meds: . acyclovir  600 mg Intravenous Q24H  . arformoterol  15 mcg Nebulization BID  . budesonide (PULMICORT) nebulizer solution  0.25 mg Nebulization BID  . diltiazem  300 mg Oral BID  . feeding supplement (ENSURE ENLIVE)  237 mL Oral TID BM  . fluticasone  2 spray Each Nare Daily  . ipratropium  0.5 mg Nebulization TID  . levalbuterol  0.63 mg Nebulization TID  . loratadine  10 mg Oral Daily  . magic mouthwash w/lidocaine  5 mL Oral TID  . mouth rinse  15 mL Mouth Rinse BID  . metoprolol tartrate  25 mg Oral Q6H  . nystatin  5 mL Oral QID  . pantoprazole  40 mg Oral Daily  . polyvinyl alcohol  1 drop Both Eyes TID  . potassium chloride  20 mEq Oral Daily  . sodium chloride flush  3 mL Intravenous Q12H  . sotalol  80 mg Oral Q12H  . Warfarin - Pharmacist Dosing Inpatient   Does not apply q1800   Continuous Infusions:   LOS: 19 days    Cyndra Feinberg Tanna Furry, MD [facility] Pager [phone]  If 7PM-7AM, please contact  night-coverage [email or web address] Password [email or web address] 09/29/2016, 1:37 PM

## 2016-09-29 NOTE — Progress Notes (Signed)
Pts QTc [phone] on EKG, PA notified, advised to administer Sotolol.  Edward Qualia RN

## 2016-09-29 NOTE — Progress Notes (Signed)
SUBJECTIVE: The patient is doing ok this morning.  No chest pain or shortness of breath at rest. Very happy to hear her rhythm is back to normal  CURRENT MEDICATIONS: . acyclovir  600 mg Intravenous Q24H  . arformoterol  15 mcg Nebulization BID  . budesonide (PULMICORT) nebulizer solution  0.25 mg Nebulization BID  . diltiazem  300 mg Oral BID  . feeding supplement (ENSURE ENLIVE)  237 mL Oral TID BM  . fluticasone  2 spray Each Nare Daily  . ipratropium  0.5 mg Nebulization TID  . levalbuterol  0.63 mg Nebulization TID  . loratadine  10 mg Oral Daily  . magic mouthwash w/lidocaine  5 mL Oral TID  . mouth rinse  15 mL Mouth Rinse BID  . metoprolol tartrate  25 mg Oral Q6H  . nystatin  5 mL Oral QID  . pantoprazole  40 mg Oral Daily  . polyvinyl alcohol  1 drop Both Eyes TID  . potassium chloride  20 mEq Oral Daily  . predniSONE  10 mg Oral Q breakfast  . sodium chloride flush  3 mL Intravenous Q12H  . sotalol  80 mg Oral Q12H  . Warfarin - Pharmacist Dosing Inpatient   Does not apply q1800     OBJECTIVE: Physical Exam: Vitals:   09/28/16 2315 09/29/16 0434 09/29/16 0609 09/29/16 0741  BP: (!) 107/51 (!) 106/51 (!) 109/51   Pulse: 80 60 60   Resp:  (!) 22    Temp:  98.2 F (36.8 C)    TempSrc:  Axillary    SpO2:  100%  99%  Weight:  133 lb (60.3 kg)    Height:        Intake/Output Summary (Last 24 hours) at 09/29/16 0820 Last data filed at 09/29/16 0300  Gross per 24 hour  Intake              349 ml  Output                0 ml  Net              349 ml    Telemetry is reviewed by myself, overnight AFib with V paced rhythm converted to SR/APaced V sensed   GEN- The patient is elderly and chronically ill appearing, alert and oriented x 3 today.   Head- normocephalic, atraumatic Eyes-  Sclera clear, conjunctiva pink Ears- hearing intact Oropharynx- clear Neck- supple  Lungs- sound clear b/l, normal work of breathing today Heart-RRR GI- soft, NT,  ND Extremities- no clubbing, cyanosis, or edema Skin- no rash or lesion Psych- euthymic mood, full affect Neuro- strength and sensation are intact  LABS: Basic Metabolic Panel:  Recent Labs  09/27/16 0508 09/28/16 0354  NA 136 136  K 3.5 3.6  CL 97* 96*  CO2 29 33*  GLUCOSE 95 100*  BUN 30* 34*  CREATININE 1.33* 1.37*  CALCIUM 8.7* 8.4*  MG  --  1.9     ASSESSMENT AND PLAN:  1.  Persistent atrial fibrillation with RVR She has failed Tikosyn with persistent atrial fibrillation   Continue Sotalol  EKG reviewed this morning by Dr. Curt Bears, QTc stable Converted overnight, A paced, V sensed rhythm this morning Continue Warfarin for CHADS2VASC of 5.  INR today 3.43, continue management per pharmacy  Rates controlled on Metoprolol and Diltiazem  BMET/Mag pending this morning, Luciann Gossett follow up  2.  Sinus node dysfunction S/p PPM Normal device function Ptx s/p implant resolved  3.  Chronic diastolic heart failure Overall, cumulatively euvolemic Continue medical therapy   4.  COPD Per primary team  5.  Oral Herpes Started on acyclovir c/w primary team   6. Prolonged hospital stay     PT working with patient, making slow progress by their notes       [name], PA-C 09/29/2016 8:20 AM    I have seen and examined this patient with [name].  Agree with above, note added to reflect my findings.  On exam, regular rhythm, no murmurs, lungs clear. Returned to sinus rhythm after sotalol dosing. QTc stable. Should her QTc remain stable throughout the day, would likely be able to go home from EP standpoint tomorrow on sotalol 80 mg BID.    Gerado Nabers M. Khyron Garno MD 09/29/2016 9:51 AM

## 2016-09-29 NOTE — Consult Note (Addendum)
Date of Admission:  09/09/2016  Date of Consult:  09/29/2016  Reason for Consult: sores in mouth Referring Physician: Dr. Carolin Sicks   HPI: Claudia [name] is an 83 y.o. female with accompanied medical history including paroxysmal atrial fibrillation COPD chronic diastolic heart failure admitted to the hospital due to respiration failure in January. He has had a complicated course and more recently developed some sores in her mouth which was being treated with fluconazole for presumed candidal infection. She failed to improve on therapy for candidiasis and question of whether she might have something else going on today's and HSV infection was raised. I recommended obtaining a swab from the mouth to send for herpes simplex PCR amplification and initiation of acyclovir.   Standing the patient she indeed has some sores on her tongue as well as around on her lips and on her nose that looked consistent with herpes simplex type I.   Past Medical History:  Diagnosis Date  . Abdominal pain, other specified site    [place] - negative abdominal ultrasound  . Abnormal chest x-ray   . Chest pain 01/19/2009   myoview stress - LVEF >75% no reversible ischemia or infarction  . COPD (chronic obstructive pulmonary disease) ([place])   . Dyslipidemia   . Hypertension   . Paroxysmal atrial flutter ([place]) 01/19/2009   echo - EF 24-58%; grade 1 diastolic dysfunction; mitral valve- calcified annlus, mildly dilated R ventricle and atrium  . Pneumonia    hx  . PONV (postoperative nausea and vomiting)   . Tremor, essential 09/07/2015    Past Surgical History:  Procedure Laterality Date  . BREAST LUMPECTOMY WITH NEEDLE LOCALIZATION Right 10/02/2012   Procedure: BREAST LUMPECTOMY WITH NEEDLE LOCALIZATION;  Surgeon: Harl Bowie, MD;  Location: [facility];  Service: General;  Laterality: Right;  . CATARACT EXTRACTION    . EYE SURGERY    . PACEMAKER IMPLANT N/A 09/18/2016   Procedure: Pacemaker Implant;  Surgeon:  Will Meredith Leeds, MD;  Location: [facility];  Service: Cardiovascular;  Laterality: N/A;  . TONSILLECTOMY    . TONSILLECTOMY    . TUBAL LIGATION      Social History:  reports that she quit smoking about 29 years ago. Her smoking use included Cigarettes. She has a 50.00 pack-year smoking history. She has never used smokeless tobacco. She reports that she does not drink alcohol or use drugs.   Family History  Problem Relation Age of Onset  . Bone cancer Mother   . Cancer Mother     bone  . Bone cancer Father   . Lung cancer Father   . Cancer Father     Lung  . Hyperlipidemia Father   . Hypertension Father   . Hypertension Sister   . Cancer Sister     breast  . Hyperlipidemia Sister   . Hyperlipidemia Sister   . Alzheimer's disease Sister   . Hypertension Sister   . Hyperlipidemia Brother   . Hypertension Brother   . Breast cancer Sister     Allergies  Allergen Reactions  . Augmentin [Amoxicillin-Pot Clavulanate] Shortness Of Breath  . Doxycycline Shortness Of Breath  . Nitrofurantoin Shortness Of Breath  . Acyclovir And Related   . Amiodarone Hcl Nausea And Vomiting  . Clarithromycin Other (See Comments)    Crazy feeling, confusion  . Clonazepam Other (See Comments)    Doesn't remember reaction  . Codeine Nausea And Vomiting  . Diltiazem Hcl Nausea And  Vomiting  . Fish Oil Nausea And Vomiting  . Flecainide Nausea Only  . Multaq [Dronedarone] Nausea Only  . Rosuvastatin Other (See Comments)    Leg cramps   . Zetia [Ezetimibe] Other (See Comments)    Leg cramps     Medications: I have reviewed patients current medications as documented in Epic Anti-infectives    Start     Dose/Rate Route Frequency Ordered Stop   09/28/16 1800  acyclovir (ZOVIRAX) 600 mg in dextrose 5 % 100 mL IVPB     600 mg 112 mL/hr over 60 Minutes Intravenous Every 24 hours 09/28/16 1633     09/23/16 1000  fluconazole (DIFLUCAN) tablet 100 mg     100 mg Oral Daily 09/22/16 1238  09/28/16 1015   09/22/16 1330  fluconazole (DIFLUCAN) IVPB 200 mg     200 mg 100 mL/hr over 60 Minutes Intravenous  Once 09/22/16 1238 09/22/16 1405   09/18/16 2315  vancomycin (VANCOCIN) IVPB 1000 mg/200 mL premix     1,000 mg 200 mL/hr over 60 Minutes Intravenous Every 12 hours 09/18/16 1545 09/19/16 0033   09/18/16 1330  gentamicin (GARAMYCIN) 80 mg in sodium chloride irrigation 0.9 % 500 mL irrigation     80 mg Irrigation On call 09/18/16 1035 09/18/16 1347   09/18/16 1330  vancomycin (VANCOCIN) IVPB 1000 mg/200 mL premix  Status:  Discontinued     1,000 mg 200 mL/hr over 60 Minutes Intravenous On call 09/18/16 1035 09/18/16 1401   09/13/16 1000  levofloxacin (LEVAQUIN) tablet 250 mg  Status:  Discontinued     250 mg Oral Daily 09/12/16 1108 09/12/16 1826   09/10/16 1000  levofloxacin (LEVAQUIN) tablet 500 mg  Status:  Discontinued     500 mg Oral Daily 09/10/16 0853 09/12/16 1108   09/09/16 0930  levofloxacin (LEVAQUIN) tablet 500 mg     500 mg Oral  Once 09/09/16 0922 09/09/16 0957         ROS:  as in HPI otherwise remainder of 12 point Review of Systems is negative   Blood pressure (!) 116/45, pulse 61, temperature 98.4 F (36.9 C), temperature source Oral, resp. rate (!) 24, height '5\' 2"'$  (1.575 m), weight 133 lb (60.3 kg), SpO2 96 %. General: Alert and awake, oriented x3, not in any acute distress. HEENT: anicteric sclera,  EOMI, oropharynx   See pictures regarding oropharynx perioral area and nose:  09/29/2016:          Cardiovascular: Irregular irregular rate, normal r,  no murmur rubs or gallops Pulmonary: clear to auscultation bilaterally, no wheezing, rales or rhonchi Gastrointestinal: soft nontender, nondistended, normal bowel sounds, Musculoskeletal: 2+ edema  Skin, soft tissue: Multiple ecchymoses  Neuro: nonfocal, strength and sensation intact   Results for orders placed or performed during the hospital encounter of 09/09/16 (from the past 48  hour(s))  Glucose, capillary     Status: Abnormal   Collection Time: 09/27/16  9:32 PM  Result Value Ref Range   Glucose-Capillary 109 (H) 65 - 99 mg/dL  Protime-INR     Status: Abnormal   Collection Time: 09/28/16  3:54 AM  Result Value Ref Range   Prothrombin Time 32.6 (H) 11.4 - 15.2 seconds   INR 0.86   Basic metabolic panel     Status: Abnormal   Collection Time: 09/28/16  3:54 AM  Result Value Ref Range   Sodium 136 135 - 145 mmol/L   Potassium 3.6 3.5 - 5.1 mmol/L   Chloride 96 (L) 101 -  111 mmol/L   CO2 33 (H) 22 - 32 mmol/L   Glucose, Bld 100 (H) 65 - 99 mg/dL   BUN 34 (H) 6 - 20 mg/dL   Creatinine, Ser 1.37 (H) 0.44 - 1.00 mg/dL   Calcium 8.4 (L) 8.9 - 10.3 mg/dL   GFR calc non Af Amer 35 (L) >60 mL/min   GFR calc Af Amer 41 (L) >60 mL/min    Comment: (NOTE) The eGFR has been calculated using the CKD EPI equation. This calculation has not been validated in all clinical situations. eGFR's persistently <60 mL/min signify possible Chronic Kidney Disease.    Anion gap 7 5 - 15  Magnesium     Status: None   Collection Time: 09/28/16  3:54 AM  Result Value Ref Range   Magnesium 1.9 1.7 - 2.4 mg/dL  Glucose, capillary     Status: None   Collection Time: 09/28/16  4:44 AM  Result Value Ref Range   Glucose-Capillary 92 65 - 99 mg/dL  Protime-INR     Status: Abnormal   Collection Time: 09/29/16  2:57 AM  Result Value Ref Range   Prothrombin Time 35.4 (H) 11.4 - 15.2 seconds   INR 3.43   Glucose, capillary     Status: Abnormal   Collection Time: 09/29/16  7:22 AM  Result Value Ref Range   Glucose-Capillary 103 (H) 65 - 99 mg/dL  Basic metabolic panel     Status: Abnormal   Collection Time: 09/29/16  8:26 AM  Result Value Ref Range   Sodium 136 135 - 145 mmol/L   Potassium 3.9 3.5 - 5.1 mmol/L   Chloride 97 (L) 101 - 111 mmol/L   CO2 32 22 - 32 mmol/L   Glucose, Bld 102 (H) 65 - 99 mg/dL   BUN 35 (H) 6 - 20 mg/dL   Creatinine, Ser 1.18 (H) 0.44 - 1.00 mg/dL    Calcium 8.9 8.9 - 10.3 mg/dL   GFR calc non Af Amer 42 (L) >60 mL/min   GFR calc Af Amer 49 (L) >60 mL/min    Comment: (NOTE) The eGFR has been calculated using the CKD EPI equation. This calculation has not been validated in all clinical situations. eGFR's persistently <60 mL/min signify possible Chronic Kidney Disease.    Anion gap 7 5 - 15  Magnesium     Status: None   Collection Time: 09/29/16  8:26 AM  Result Value Ref Range   Magnesium 1.9 1.7 - 2.4 mg/dL   '@BRIEFLABTABLE'$ (sdes,specrequest,cult,reptstatus)   ) Recent Results (from the past 720 hour(s))  Respiratory Panel by PCR     Status: None   Collection Time: 09/10/16  1:22 PM  Result Value Ref Range Status   Adenovirus NOT DETECTED NOT DETECTED Final   Coronavirus 229E NOT DETECTED NOT DETECTED Final   Coronavirus HKU1 NOT DETECTED NOT DETECTED Final   Coronavirus NL63 NOT DETECTED NOT DETECTED Final   Coronavirus OC43 NOT DETECTED NOT DETECTED Final   Metapneumovirus NOT DETECTED NOT DETECTED Final   Rhinovirus / Enterovirus NOT DETECTED NOT DETECTED Final   Influenza A NOT DETECTED NOT DETECTED Final   Influenza B NOT DETECTED NOT DETECTED Final   Parainfluenza Virus 1 NOT DETECTED NOT DETECTED Final   Parainfluenza Virus 2 NOT DETECTED NOT DETECTED Final   Parainfluenza Virus 3 NOT DETECTED NOT DETECTED Final   Parainfluenza Virus 4 NOT DETECTED NOT DETECTED Final   Respiratory Syncytial Virus NOT DETECTED NOT DETECTED Final   Bordetella pertussis NOT DETECTED  NOT DETECTED Final   Chlamydophila pneumoniae NOT DETECTED NOT DETECTED Final   Mycoplasma pneumoniae NOT DETECTED NOT DETECTED Final  MRSA PCR Screening     Status: None   Collection Time: 09/13/16  1:53 AM  Result Value Ref Range Status   MRSA by PCR NEGATIVE NEGATIVE Final    Comment:        The GeneXpert MRSA Assay (FDA approved for NASAL specimens only), is one component of a comprehensive MRSA colonization surveillance program. It is  not intended to diagnose MRSA infection nor to guide or monitor treatment for MRSA infections.   Surgical pcr screen     Status: None   Collection Time: 09/18/16 12:07 PM  Result Value Ref Range Status   MRSA, PCR NEGATIVE NEGATIVE Final   Staphylococcus aureus NEGATIVE NEGATIVE Final    Comment:        The Xpert SA Assay (FDA approved for NASAL specimens in patients over 4 years of age), is one component of a comprehensive surveillance program.  Test performance has been validated by [facility] for patients greater than or equal to 69 year old. It is not intended to diagnose infection nor to guide or monitor treatment.   Culture, blood (Routine X 2) w Reflex to ID Panel     Status: None   Collection Time: 09/21/16  8:45 AM  Result Value Ref Range Status   Specimen Description BLOOD LEFT ANTECUBITAL  Final   Special Requests IN PEDIATRIC BOTTLE 3CC  Final   Culture NO GROWTH 5 DAYS  Final   Report Status 09/26/2016 FINAL  Final  Culture, blood (Routine X 2) w Reflex to ID Panel     Status: None   Collection Time: 09/21/16  8:55 AM  Result Value Ref Range Status   Specimen Description BLOOD LEFT HAND  Final   Special Requests IN PEDIATRIC BOTTLE 3CC  Final   Culture NO GROWTH 5 DAYS  Final   Report Status 09/26/2016 FINAL  Final  Urine culture     Status: None   Collection Time: 09/22/16 12:45 PM  Result Value Ref Range Status   Specimen Description URINE, RANDOM  Final   Special Requests NONE  Final   Culture NO GROWTH  Final   Report Status 09/23/2016 FINAL  Final     Impression/Recommendation  Principal Problem:   Acute on chronic respiratory failure (HCC) Active Problems:   Long term current use of anticoagulant therapy   Paroxysmal atrial fibrillation (HCC)   HTN (hypertension)   COPD exacerbation (HCC)   Chronic diastolic CHF (congestive heart failure) (HCC)   COPD with acute exacerbation (HCC)   Hypoxia   Respiratory distress   Tachy-brady syndrome  (HCC)   Chronic atrial fibrillation (HCC)   Acute respiratory failure with hypoxemia (HCC)   Pneumothorax, traumatic   Acute delirium   Cardiac pacemaker in situ   S/P chest tube placement (HCC)   Leukocytosis   Oral thrush   Oral herpes simplex infection   Claudia [name] is a 83 y.o. female with  Multiple medical problems including atrial fibrillation heart failure COPD admitted for S3 failure who developed oral sores that were treated as if they were candidal infection but failed to improve now looks more likely to be suffering from herpes type I infection of the oropharynx  #1 Likely herpes simplex 1 infection: A viral PCR for herpes simplex 12  Has been sent. clinically this looks like a herpes infection of the oropharynx. Would continue  IV acyclovir and as she improves transitioned to Valtrex. Treat her for 10 days at most.  If she fails to improve on antiviral therapy for herpes would consult ear nose and throat for biopsy of one of these lesions  #2 screening was pictures she is screen for HIV hep B hepatitis C  I will sign off for now please call with further questions.   09/29/2016, 4:51 PM   Thank you so much for this interesting consult  [place] [facility] [place] [phone] (pager) [phone] (office) 09/29/2016, 4:51 PM  Rhina Brackett [place] 09/29/2016, 4:51 PM

## 2016-09-29 NOTE — Plan of Care (Signed)
Problem: Respiratory: Goal: Levels of oxygenation will improve Outcome: Progressing Pt weaned to 2L O2 BNC, tolerating well

## 2016-09-29 NOTE — Telephone Encounter (Signed)
Spoke with Maudie Mercury and she states that pt will need brovana and budesonide 0.25 bid  Orders sent to [facility]

## 2016-09-30 LAB — BASIC METABOLIC PANEL
ANION GAP: 5 (ref 5–15)
BUN: 34 mg/dL — ABNORMAL HIGH (ref 6–20)
CHLORIDE: 98 mmol/L — AB (ref 101–111)
CO2: 34 mmol/L — AB (ref 22–32)
Calcium: 9.2 mg/dL (ref 8.9–10.3)
Creatinine, Ser: 1.11 mg/dL — ABNORMAL HIGH (ref 0.44–1.00)
GFR calc Af Amer: 53 mL/min — ABNORMAL LOW (ref >60)
GFR calc non Af Amer: 45 mL/min — ABNORMAL LOW (ref >60)
GLUCOSE: 128 mg/dL — AB (ref 65–99)
POTASSIUM: 4.4 mmol/L (ref 3.5–5.1)
Sodium: 137 mmol/L (ref 135–145)

## 2016-09-30 LAB — PROTIME-INR
INR: 2.62
Prothrombin Time: 28.6 seconds — ABNORMAL HIGH (ref 11.4–15.2)

## 2016-09-30 LAB — MAGNESIUM: Magnesium: 2 mg/dL (ref 1.7–2.4)

## 2016-09-30 LAB — GLUCOSE, CAPILLARY
GLUCOSE-CAPILLARY: 125 mg/dL — AB (ref 65–99)
Glucose-Capillary: 128 mg/dL — ABNORMAL HIGH (ref 65–99)

## 2016-09-30 LAB — HIV ANTIBODY (ROUTINE TESTING W REFLEX): HIV SCREEN 4TH GENERATION: NONREACTIVE

## 2016-09-30 MED ORDER — ARFORMOTEROL TARTRATE 15 MCG/2ML IN NEBU: 15.0000 ug | INHALATION_SOLUTION | Freq: Two times a day (BID) | RESPIRATORY_TRACT | 0 refills | Status: AC

## 2016-09-30 MED ORDER — METOPROLOL TARTRATE 25 MG PO TABS: 25.0000 mg | ORAL_TABLET | Freq: Four times a day (QID) | ORAL | 0 refills | Status: DC

## 2016-09-30 MED ORDER — LORATADINE 10 MG PO TABS: 10.0000 mg | ORAL_TABLET | Freq: Every day | ORAL | 0 refills | Status: AC

## 2016-09-30 MED ORDER — DILTIAZEM HCL ER COATED BEADS 300 MG PO CP24: 300.0000 mg | ORAL_CAPSULE | Freq: Two times a day (BID) | ORAL | 0 refills | Status: AC

## 2016-09-30 MED ORDER — POTASSIUM CHLORIDE CRYS ER 20 MEQ PO TBCR: 20.0000 meq | EXTENDED_RELEASE_TABLET | Freq: Every day | ORAL | 0 refills | Status: AC

## 2016-09-30 MED ORDER — BUDESONIDE 0.25 MG/2ML IN SUSP: 0.2500 mg | Freq: Two times a day (BID) | RESPIRATORY_TRACT | 0 refills | Status: AC

## 2016-09-30 MED ORDER — SOTALOL HCL 80 MG PO TABS: 80.0000 mg | ORAL_TABLET | Freq: Two times a day (BID) | ORAL | 0 refills | Status: AC

## 2016-09-30 MED ORDER — WARFARIN SODIUM 1 MG PO TABS: 1.0000 mg | ORAL_TABLET | Freq: Once | ORAL | Status: DC

## 2016-09-30 MED ORDER — VALACYCLOVIR HCL 1 G PO TABS: 1000.0000 mg | ORAL_TABLET | Freq: Two times a day (BID) | ORAL | 0 refills | Status: DC

## 2016-09-30 MED ORDER — BENZOCAINE 10 % MT GEL: Freq: Four times a day (QID) | OROMUCOSAL | 0 refills | Status: AC | PRN

## 2016-09-30 NOTE — Progress Notes (Signed)
ANTICOAGULATION CONSULT NOTE - Follow Up Consult  Pharmacy Consult for warfarin Indication: atrial fibrillation  Allergies  Allergen Reactions  . Augmentin [Amoxicillin-Pot Clavulanate] Shortness Of Breath  . Doxycycline Shortness Of Breath  . Nitrofurantoin Shortness Of Breath  . Acyclovir And Related   . Amiodarone Hcl Nausea And Vomiting  . Clarithromycin Other (See Comments)    Crazy feeling, confusion  . Clonazepam Other (See Comments)    Doesn't remember reaction  . Codeine Nausea And Vomiting  . Diltiazem Hcl Nausea And Vomiting  . Fish Oil Nausea And Vomiting  . Flecainide Nausea Only  . Multaq [Dronedarone] Nausea Only  . Rosuvastatin Other (See Comments)    Leg cramps   . Zetia [Ezetimibe] Other (See Comments)    Leg cramps    Patient Measurements: Height: 5\' 2"  (157.5 cm) Weight: 132 lb 12.8 oz (60.2 kg) IBW/kg (Calculated) : 50.1  Vital Signs: Temp: 98.6 F (37 C) (02/17 0528) Temp Source: Oral (02/17 0528) BP: 122/55 (02/17 0528) Pulse Rate: 59 (02/17 0528)  Labs:  Recent Labs  09/28/16 0354 09/29/16 0257 09/29/16 0826 09/30/16 0501  LABPROT 32.6* 35.4*  --  28.6*  INR 3.10 3.43  --  2.62  CREATININE 1.37*  --  1.18* 1.11*    Estimated Creatinine Clearance: 33.9 mL/min (by C-G formula based on SCr of 1.11 mg/dL (H)).  Assessment: 43 YOF admitted for SOB on PTA warfarin for Afib. Pharmacy consulted to dose warfarin for h/o Afib.   -INR today 2.62 therapeutic -Hgb 10.3, plts 273- no bleeding noted from 2/13  PTA warfarin dose was 2mg  MWF, 1mg  all other days (dose very stable as outpatient per clinic records).   Goal of Therapy:  INR 2-3 Monitor platelets by anticoagulation protocol: Yes   Plan:  -Restart home dose with 1mg  x1 tonight -Daily PT/INR while in the hospital, CBC as needed -Monitor for signs/symptoms of bleeding  Myer Peer Grayland Ormond), PharmD  [id number] Pharmacy Resident Pager: [phone] 09/30/2016 8:55 AM

## 2016-09-30 NOTE — Progress Notes (Signed)
SUBJECTIVE: The patient is doing ok this morning.  No chest pain or shortness of breath at rest. A paced this AM tolerating sotalol without issues. Did have a short time in atrial fibrillation yesterday.  CURRENT MEDICATIONS: . acyclovir  600 mg Intravenous Q24H  . arformoterol  15 mcg Nebulization BID  . budesonide (PULMICORT) nebulizer solution  0.25 mg Nebulization BID  . diltiazem  300 mg Oral BID  . feeding supplement (ENSURE ENLIVE)  237 mL Oral TID BM  . fluticasone  2 spray Each Nare Daily  . ipratropium  0.5 mg Nebulization TID  . levalbuterol  0.63 mg Nebulization TID  . loratadine  10 mg Oral Daily  . magic mouthwash w/lidocaine  5 mL Oral TID  . mouth rinse  15 mL Mouth Rinse BID  . metoprolol tartrate  25 mg Oral Q6H  . nystatin  5 mL Oral QID  . pantoprazole  40 mg Oral Daily  . polyvinyl alcohol  1 drop Both Eyes TID  . potassium chloride  20 mEq Oral Daily  . sodium chloride flush  3 mL Intravenous Q12H  . sotalol  80 mg Oral Q12H  . Warfarin - Pharmacist Dosing Inpatient   Does not apply q1800     OBJECTIVE: Physical Exam: Vitals:   09/29/16 2044 09/29/16 2100 09/30/16 0528 09/30/16 0730  BP:   (!) 122/55   Pulse:  60 (!) 59   Resp:  (!) 21 19   Temp:   98.6 F (37 C)   TempSrc:   Oral   SpO2: 98% 96% 94% 94%  Weight:   132 lb 12.8 oz (60.2 kg)   Height:   5\' 2"  (1.575 m)     Intake/Output Summary (Last 24 hours) at 09/30/16 0825 Last data filed at 09/29/16 2100  Gross per 24 hour  Intake              352 ml  Output                0 ml  Net              352 ml    Telemetry is reviewed by myself, overnight AFib with V paced rhythm converted to SR/APaced V sensed   GEN- The patient is elderly and chronically ill appearing, alert and oriented x 3 today.   Head- normocephalic, atraumatic Eyes-  Sclera clear, conjunctiva pink Ears- hearing intact Oropharynx- clear Neck- supple  Lungs- sound clear b/l, normal work of breathing  today Heart-RRR GI- soft, NT, ND Extremities- no clubbing, cyanosis, or edema Skin- no rash or lesion Psych- euthymic mood, full affect Neuro- strength and sensation are intact  LABS: Basic Metabolic Panel:  Recent Labs  09/29/16 0826 09/30/16 0501  NA 136 137  K 3.9 4.4  CL 97* 98*  CO2 32 34*  GLUCOSE 102* 128*  BUN 35* 34*  CREATININE 1.18* 1.11*  CALCIUM 8.9 9.2  MG 1.9 2.0     ASSESSMENT AND PLAN:  1.  Persistent atrial fibrillation with RVR She has failed Tikosyn with persistent atrial fibrillation   Continue Sotalol  QTc stable Continue Warfarin for CHADS2VASC of 5.  INR today 2.62, continue management per pharmacy  Rates controlled on Metoprolol and Diltiazem  BMET/Mag pending this morning, Iraida Cragin follow up Follow up made in AF clinic  2.  Sinus node dysfunction S/p PPM Normal device function Ptx s/p implant resolved Follow up made in device clinic  3.  Chronic  diastolic heart failure Overall, cumulatively euvolemic Continue medical therapy   4.  COPD Per primary team  5.  Oral Herpes Started on acyclovir c/w primary team   6. Prolonged hospital stay     PT working with patient, making slow progress by their notes     EP to sign off. Has follow up in device clinic and AF clinic.  Dacey Milberger M. Makell Cyr MD 09/30/2016 8:25 AM

## 2016-09-30 NOTE — Discharge Summary (Addendum)
Physician Discharge Summary  Claudia [name] [id number] DOB: 04/04/1933 DOA: 09/09/2016  PCP: Pcp Not In System  Admit date: 09/09/2016 Discharge date: 09/30/2016  Admitted From:Home Disposition:home with home care  Recommendations for Outpatient Follow-up:  1. Follow up with PCP in 1-2 weeks 2. Please obtain BMP/CBC in one week   Home Health:yes Discharge Condition:stable CODE STATUS:DNR Diet recommendation:Heart healthy diet.  Brief/Interim Summary: 83yo F admitted 1/27 with dyspnea, cough and hypoxia thought related to AECOPD. Found to be in AF with RVR 1/28. On 2/5 left pnx post PPM insertion, chest tube was removed with resolution of pneumothorax. Patient going in and out of age of fibrillation with cardiology managed her medications for better rate control.  # Atrial fibrillation with RVR/tachycardia bradycardia syndrome: Patient with fluctuation in heart rate and followed by cardiologist. -Added sotalol by cardiologist. Patient already on diltiazem and metoprolol. Converted to sinus rhythm therefore cardioversion was deferred by cardiologist. [name] to discharge as per cardiologist with outpatient follow-up. I discussed with the patient and her daughter at bedside to monitor blood pressure and heart rate at home. Patient is on Coumadin for systemic anticoagulation. I recommended to monitor INR and adjust the dose of Coumadin.  #Acute on chronic hypoxic respiratory failure due to acute COPD exacerbation: -Patient is clinically improved. No shortness of breath now. Continue current bronchodilators. Completed prednisone taper. -Patient qualified for 2 L of oxygen at home. Charge order made. Recommended to follow up with PCP outpatient. Patient was using oxygen at night only before this hospitalization.  #Left pneumothorax post PPM insertion: Chest from has been removed. Patient is clinically stable.  #Acute delirium in the hospital likely due to steroid: Patient's mental status  is stable today. Continue supportive care.  #Chronic congestive heart failure: Echo on 1/31 showed EF of 60% with grade 2 diastolic dysfunction. Patient is euvolemic on exam.  #Oral lesions, treated with nystatin and fluconazole for possible oral thrush. No improvement in her oral rashes. Patient has blisters in her tongue which are painful likely due to oral herpes infection. Patient is immunocompromised in the setting of steroids use. -Treated with IV acyclovir with significant clinical improvement. Evaluated by infectious disease. Recommended to discharge with oral Valtrex. Patient with increased oral intake and improvement in her mouth lesions and pain.  #Acute kidney injury likely hemodynamically mediated in the setting of tachycardia. Serum creatinine improved to 1.1 today.  Patient with a clinical improvement. Medications adjusted by cardiologist. Heart rate controlled. Recommended to discharge home with the current medications by cardiologist. I discussed with patient and her daughter at bedside regarding medication and discharge plan. Patient is stable to go home with outpatient follow-up this time.   Discharge Diagnoses:  Principal Problem:   Acute on chronic respiratory failure (HCC) Active Problems:   Long term current use of anticoagulant therapy   Paroxysmal atrial fibrillation (HCC)   HTN (hypertension)   COPD exacerbation (HCC)   Chronic diastolic CHF (congestive heart failure) (HCC)   COPD with acute exacerbation (HCC)   Hypoxia   Acute respiratory distress   Tachy-brady syndrome (HCC)   Chronic atrial fibrillation (HCC)   Acute respiratory failure with hypoxemia (HCC)   Pneumothorax, traumatic   Acute delirium   Cardiac pacemaker in situ   S/P chest tube placement ([place])   Leukocytosis   Oral thrush   Oral herpes simplex infection    Discharge Instructions  Discharge Instructions    Call MD for:  difficulty breathing, headache or visual disturbances  Complete by:  As directed    Call MD for:  extreme fatigue    Complete by:  As directed    Call MD for:  hives    Complete by:  As directed    Call MD for:  persistant dizziness or light-headedness    Complete by:  As directed    Call MD for:  persistant nausea and vomiting    Complete by:  As directed    Call MD for:  severe uncontrolled pain    Complete by:  As directed    Call MD for:  temperature >100.4    Complete by:  As directed    Diet - low sodium heart healthy    Complete by:  As directed    Discharge instructions    Complete by:  As directed    Please monitor heart rate and blood pressure at home and follow up with your PCP and cardiologist. Please take coumadin 1mg  today and then adjust the dose depending on INR result.   For home use only DME oxygen    Complete by:  As directed    Liters per Minute:  2   Frequency:  Continuous (stationary and portable oxygen unit needed)   Oxygen conserving device:  Yes   Oxygen delivery system:  Gas   Increase activity slowly    Complete by:  As directed      Allergies as of 09/30/2016      Reactions   Augmentin [amoxicillin-pot Clavulanate] Shortness Of Breath   Doxycycline Shortness Of Breath   Nitrofurantoin Shortness Of Breath   Acyclovir And Related    Amiodarone Hcl Nausea And Vomiting   Clarithromycin Other (See Comments)   Crazy feeling, confusion   Clonazepam Other (See Comments)   Doesn't remember reaction   Codeine Nausea And Vomiting   Diltiazem Hcl Nausea And Vomiting   Fish Oil Nausea And Vomiting   Flecainide Nausea Only   Multaq [dronedarone] Nausea Only   Rosuvastatin Other (See Comments)   Leg cramps   Zetia [ezetimibe] Other (See Comments)   Leg cramps      Medication List    STOP taking these medications   amLODipine 2.5 MG tablet Commonly known as:  NORVASC   amLODipine 5 MG tablet Commonly known as:  NORVASC   aspirin 81 MG tablet   losartan 25 MG tablet Commonly known as:  COZAAR      TAKE these medications   acetaminophen 325 MG tablet Commonly known as:  TYLENOL Take 650 mg by mouth every 6 (six) hours as needed for pain.   albuterol 108 (90 Base) MCG/ACT inhaler Commonly known as:  PROAIR HFA INHALE 2 PUFFS EVERY 6 HOURS AS NEEDED FOR WHEEZING OR SHORTNESS OF BREATH   ALPRAZolam 0.25 MG tablet Commonly known as:  XANAX TAKE 1 TABLET BY MOUTH THREE TIMES DAILY AS NEEDED   arformoterol 15 MCG/2ML Nebu Commonly known as:  BROVANA Take 2 mLs (15 mcg total) by nebulization 2 (two) times daily.   benzocaine 10 % mucosal gel Commonly known as:  ORAJEL Use as directed in the mouth or throat 4 (four) times daily as needed for mouth pain (to gums prn).   budesonide 0.25 MG/2ML nebulizer solution Commonly known as:  PULMICORT Take 2 mLs (0.25 mg total) by nebulization 2 (two) times daily.   carboxymethylcellulose 0.5 % Soln Commonly known as:  REFRESH PLUS Place 1-2 drops into both eyes 2 (two) times daily.   diltiazem 300 MG 24  hr capsule Commonly known as:  CARDIZEM CD Take 1 capsule (300 mg total) by mouth 2 (two) times daily.   loratadine 10 MG tablet Commonly known as:  CLARITIN Take 1 tablet (10 mg total) by mouth daily. Start taking on:  10/01/2016   metoprolol tartrate 25 MG tablet Commonly known as:  LOPRESSOR Take 1 tablet (25 mg total) by mouth every 6 (six) hours.   OXYGEN Inhale 2 L into the lungs. Use @@ Bedtime   potassium chloride SA 20 MEQ tablet Commonly known as:  K-DUR,KLOR-CON Take 1 tablet (20 mEq total) by mouth daily. Start taking on:  10/01/2016   sotalol 80 MG tablet Commonly known as:  BETAPACE Take 1 tablet (80 mg total) by mouth 2 (two) times daily. What changed:  See the new instructions.   tiotropium 18 MCG inhalation capsule Commonly known as:  SPIRIVA HANDIHALER 1 inhale daily What changed:  how much to take  how to take this  when to take this  additional instructions   valACYclovir 1000 MG  tablet Commonly known as:  VALTREX Take 1 tablet (1,000 mg total) by mouth 2 (two) times daily.   warfarin 1 MG tablet Commonly known as:  COUMADIN TAKE 1 TO 2 TABLETS BY MOUTH EVERY DAY AS DIRECTED BY COUMADIN CLINIC What changed:  See the new instructions.            Durable Medical Equipment        Start     Ordered   09/30/16 0000  For home use only DME oxygen    Question Answer Comment  Liters per Minute 2   Frequency Continuous (stationary and portable oxygen unit needed)   Oxygen conserving device Yes   Oxygen delivery system Gas      09/30/16 1043   09/23/16 0805  For home use only DME Walker rolling  Once    Question:  Patient needs a walker to treat with the following condition  Answer:  Debility   09/23/16 0805   09/10/16 1302  For home use only DME Nebulizer machine  Once    Question:  Patient needs a nebulizer to treat with the following condition  Answer:  Hypoxia   09/10/16 1301   09/10/16 1302  For home use only DME Nebulizer/meds  Once    Question:  Patient needs a nebulizer to treat with the following condition  Answer:  Hypoxia   09/10/16 1301     Follow-up Information    Will Meredith Leeds, MD Follow up on 11/09/2016.   Specialty:  Cardiology Why:  10:30AM Contact information: [address] [place] [address] [facility] Follow up.   Why:  Home Health RN, Physical Threrapy, Occupational Therapy, aide and Social Worker arranged- they will call you to set up home visits Contact information: [place] [address] [phone]        [facility] Follow up.   Why:  Best boy information: [address] [place] [address] [phone]        [facility] Follow up.   WhySystems developer information: [place] [address] [phone]        [facility] Follow up on 10/09/2016.   Specialty:  Cardiology Why:  10:00AM Contact information: [address] [id number] [place] [facility] [phone]  [place] [facility] Follow up on 10/12/2016.   Specialty:  Cardiology Why:  11:00AM, wound check/pacemaker follow-up Contact information: [address], [place] [address] [phone]         Allergies  Allergen Reactions  . Augmentin [Amoxicillin-Pot Clavulanate] Shortness Of Breath  . Doxycycline Shortness Of Breath  . Nitrofurantoin Shortness Of Breath  . Acyclovir And Related   . Amiodarone Hcl Nausea And Vomiting  . Clarithromycin Other (See Comments)    Crazy feeling, confusion  . Clonazepam Other (See Comments)    Doesn't remember reaction  . Codeine Nausea And Vomiting  . Diltiazem Hcl Nausea And Vomiting  . Fish Oil Nausea And Vomiting  . Flecainide Nausea Only  . Multaq [Dronedarone] Nausea Only  . Rosuvastatin Other (See Comments)    Leg cramps   . Zetia [Ezetimibe] Other (See Comments)    Leg cramps    Consultations:  Cardiology: Dr. Debara Pickett 09/12/2016  Electrophysiology Dr.Camintz 09/13/2016  PCCM Dr. Nelda Marseille 09/13/2016  Infectious disease, Dr. Tommy Medal 2/15.  Procedures/Studies:  2-D echo 09/13/2016  Pacemaker implantation per Dr. Baird Kay 09/18/2016  SIGNIFICANT EVENTS: 1/27 Admitted with dyspnea and hypoxic respiratory failure 1/28 AFib with RVR, responded to metoprolol, sotalol 1/30 Bradycardia in 30s, then AFib with RVR in 150s 1/31 Transferred to ICU due to respiratory distress, tachy-brady 2/03 Started on CCB gtt 2/04 Rate controlled.  2/5 pnx fromm PPM placed 2/5 2/7 dc chest tube  LINES/TUBES: PIV 1/27 2/5 left chest tube>>09/20/2016  Antimicrobials:  Oral Levaquin 09/09/2016>>>>> 09/12/2016  IV vancomycin 09/18/2016 1  Subjective: Patient was seen and examined at bedside. Reported feeling  much better and ready to go home today. Denied headache, dizziness, nausea, vomiting, chest pain or shortness of breath. Reported that her oral pain is significantly improved and able to eat well. Patient's daughter at bedside. They reported that they are discussed with the cardiologist today and ready to go home today. Verbalized understanding of follow-up instructions.  Discharge Exam: Vitals:   09/29/16 2100 09/30/16 0528  BP:  (!) 122/55  Pulse: 60 (!) 59  Resp: (!) 21 19  Temp:  98.6 F (37 C)   Vitals:   09/29/16 2044 09/29/16 2100 09/30/16 0528 09/30/16 0730  BP:   (!) 122/55   Pulse:  60 (!) 59   Resp:  (!) 21 19   Temp:   98.6 F (37 C)   TempSrc:   Oral   SpO2: 98% 96% 94% 94%  Weight:   60.2 kg (132 lb 12.8 oz)   Height:   5\' 2"  (1.575 m)     General: Pt is alert, awake, not in acute distress HEENT; oral lesions significantly improved. Cardiovascular: RRR, S1/S2 +, no rubs, no gallops Respiratory: CTA bilaterally, no wheezing, no rhonchi Abdominal: Soft, NT, ND, bowel sounds + Extremities: no edema, no cyanosis    The results of significant diagnostics from this hospitalization (including imaging, microbiology, ancillary and laboratory) are listed below for reference.     Microbiology: Recent Results (from the past 240 hour(s))  Culture, blood (Routine X 2) w Reflex to ID Panel     Status: None   Collection Time: 09/21/16  8:45 AM  Result Value Ref Range Status   Specimen Description BLOOD LEFT ANTECUBITAL  Final   Special Requests IN PEDIATRIC BOTTLE 3CC  Final   Culture NO GROWTH 5 DAYS  Final   Report Status 09/26/2016 FINAL  Final  Culture, blood (Routine X 2) w  Reflex to ID Panel     Status: None   Collection Time: 09/21/16  8:55 AM  Result Value Ref Range Status   Specimen Description BLOOD LEFT HAND  Final   Special Requests IN PEDIATRIC BOTTLE 3CC  Final   Culture NO GROWTH 5 DAYS  Final   Report Status 09/26/2016 FINAL  Final  Urine culture      Status: None   Collection Time: 09/22/16 12:45 PM  Result Value Ref Range Status   Specimen Description URINE, RANDOM  Final   Special Requests NONE  Final   Culture NO GROWTH  Final   Report Status 09/23/2016 FINAL  Final     Labs: BNP (last 3 results)  Recent Labs  09/09/16 0934  BNP [id number]*   Basic Metabolic Panel:  Recent Labs Lab 09/25/16 0742 09/26/16 0424 09/27/16 0508 09/28/16 0354 09/29/16 0826 09/30/16 0501  NA 137 135 136 136 136 137  K 3.9 3.7 3.5 3.6 3.9 4.4  CL 97* 96* 97* 96* 97* 98*  CO2 28 30 29  33* 32 34*  GLUCOSE 94 104* 95 100* 102* 128*  BUN 28* 25* 30* 34* 35* 34*  CREATININE 1.18* 1.34* 1.33* 1.37* 1.18* 1.11*  CALCIUM 8.8* 8.8* 8.7* 8.4* 8.9 9.2  MG 1.9 2.0  --  1.9 1.9 2.0   Liver Function Tests: No results for input(s): AST, ALT, ALKPHOS, BILITOT, PROT, ALBUMIN in the last 168 hours. No results for input(s): LIPASE, AMYLASE in the last 168 hours. No results for input(s): AMMONIA in the last 168 hours. CBC:  Recent Labs Lab 09/25/16 0742 09/26/16 0424  WBC 16.8* 16.6*  HGB 10.4* 10.3*  HCT 33.1* 33.0*  MCV 88.7 88.9  PLT 267 273   Cardiac Enzymes: No results for input(s): CKTOTAL, CKMB, CKMBINDEX, TROPONINI in the last 168 hours. BNP: Invalid input(s): POCBNP CBG:  Recent Labs Lab 09/27/16 2132 09/28/16 0444 09/29/16 0722 09/30/16 0929 09/30/16 1131  GLUCAP 109* 92 103* 125* 128*   D-Dimer No results for input(s): DDIMER in the last 72 hours. Hgb A1c No results for input(s): HGBA1C in the last 72 hours. Lipid Profile No results for input(s): CHOL, HDL, LDLCALC, TRIG, CHOLHDL, LDLDIRECT in the last 72 hours. Thyroid function studies No results for input(s): TSH, T4TOTAL, T3FREE, THYROIDAB in the last 72 hours.  Invalid input(s): FREET3 Anemia work up No results for input(s): VITAMINB12, FOLATE, FERRITIN, TIBC, IRON, RETICCTPCT in the last 72 hours. Urinalysis    Component Value Date/Time   COLORURINE YELLOW  09/22/2016 0751   APPEARANCEUR CLEAR 09/22/2016 0751   LABSPEC 1.018 09/22/2016 0751   PHURINE 6.0 09/22/2016 0751   GLUCOSEU NEGATIVE 09/22/2016 0751   HGBUR NEGATIVE 09/22/2016 0751   BILIRUBINUR NEGATIVE 09/22/2016 0751   KETONESUR NEGATIVE 09/22/2016 0751   PROTEINUR NEGATIVE 09/22/2016 0751   UROBILINOGEN 0.2 01/17/2009 1750   NITRITE NEGATIVE 09/22/2016 0751   LEUKOCYTESUR NEGATIVE 09/22/2016 0751   Sepsis Labs Invalid input(s): PROCALCITONIN,  WBC,  LACTICIDVEN Microbiology Recent Results (from the past 240 hour(s))  Culture, blood (Routine X 2) w Reflex to ID Panel     Status: None   Collection Time: 09/21/16  8:45 AM  Result Value Ref Range Status   Specimen Description BLOOD LEFT ANTECUBITAL  Final   Special Requests IN PEDIATRIC BOTTLE 3CC  Final   Culture NO GROWTH 5 DAYS  Final   Report Status 09/26/2016 FINAL  Final  Culture, blood (Routine X 2) w Reflex to ID Panel  Status: None   Collection Time: 09/21/16  8:55 AM  Result Value Ref Range Status   Specimen Description BLOOD LEFT HAND  Final   Special Requests IN PEDIATRIC BOTTLE 3CC  Final   Culture NO GROWTH 5 DAYS  Final   Report Status 09/26/2016 FINAL  Final  Urine culture     Status: None   Collection Time: 09/22/16 12:45 PM  Result Value Ref Range Status   Specimen Description URINE, RANDOM  Final   Special Requests NONE  Final   Culture NO GROWTH  Final   Report Status 09/23/2016 FINAL  Final     Time coordinating discharge: 33 minutes  SIGNED:   Rosita Fire, MD  [facility] 09/30/2016, 12:06 PM  If 7PM-7AM, please contact night-coverage [email or web address] Password [email or web address]

## 2016-10-01 DIAGNOSIS — I5032 Chronic diastolic (congestive) heart failure: Secondary | ICD-10-CM | POA: Diagnosis not present

## 2016-10-01 DIAGNOSIS — Z950 Presence of cardiac pacemaker: Secondary | ICD-10-CM | POA: Diagnosis not present

## 2016-10-01 DIAGNOSIS — J441 Chronic obstructive pulmonary disease with (acute) exacerbation: Secondary | ICD-10-CM | POA: Diagnosis not present

## 2016-10-01 DIAGNOSIS — E785 Hyperlipidemia, unspecified: Secondary | ICD-10-CM | POA: Diagnosis not present

## 2016-10-01 DIAGNOSIS — I480 Paroxysmal atrial fibrillation: Secondary | ICD-10-CM | POA: Diagnosis not present

## 2016-10-01 DIAGNOSIS — I110 Hypertensive heart disease with heart failure: Secondary | ICD-10-CM | POA: Diagnosis not present

## 2016-10-01 LAB — HEPATITIS C ANTIBODY (REFLEX)

## 2016-10-01 LAB — HERPES SIMPLEX VIRUS(HSV) DNA BY PCR
HSV 1 DNA: POSITIVE — AB
HSV 2 DNA: NEGATIVE

## 2016-10-01 LAB — HCV COMMENT:

## 2016-10-04 DIAGNOSIS — E785 Hyperlipidemia, unspecified: Secondary | ICD-10-CM | POA: Diagnosis not present

## 2016-10-04 DIAGNOSIS — J441 Chronic obstructive pulmonary disease with (acute) exacerbation: Secondary | ICD-10-CM | POA: Diagnosis not present

## 2016-10-04 DIAGNOSIS — I480 Paroxysmal atrial fibrillation: Secondary | ICD-10-CM | POA: Diagnosis not present

## 2016-10-04 DIAGNOSIS — Z950 Presence of cardiac pacemaker: Secondary | ICD-10-CM | POA: Diagnosis not present

## 2016-10-04 DIAGNOSIS — I5032 Chronic diastolic (congestive) heart failure: Secondary | ICD-10-CM | POA: Diagnosis not present

## 2016-10-04 DIAGNOSIS — I110 Hypertensive heart disease with heart failure: Secondary | ICD-10-CM | POA: Diagnosis not present

## 2016-10-04 LAB — HEPATITIS B SURFACE ANTIGEN: Hepatitis B Surface Ag: NEGATIVE

## 2016-10-05 DIAGNOSIS — J441 Chronic obstructive pulmonary disease with (acute) exacerbation: Secondary | ICD-10-CM | POA: Diagnosis not present

## 2016-10-05 DIAGNOSIS — E785 Hyperlipidemia, unspecified: Secondary | ICD-10-CM | POA: Diagnosis not present

## 2016-10-05 DIAGNOSIS — I480 Paroxysmal atrial fibrillation: Secondary | ICD-10-CM | POA: Diagnosis not present

## 2016-10-05 DIAGNOSIS — I110 Hypertensive heart disease with heart failure: Secondary | ICD-10-CM | POA: Diagnosis not present

## 2016-10-05 DIAGNOSIS — Z950 Presence of cardiac pacemaker: Secondary | ICD-10-CM | POA: Diagnosis not present

## 2016-10-05 DIAGNOSIS — I5032 Chronic diastolic (congestive) heart failure: Secondary | ICD-10-CM | POA: Diagnosis not present

## 2016-10-06 DIAGNOSIS — I480 Paroxysmal atrial fibrillation: Secondary | ICD-10-CM | POA: Diagnosis not present

## 2016-10-06 DIAGNOSIS — J441 Chronic obstructive pulmonary disease with (acute) exacerbation: Secondary | ICD-10-CM | POA: Diagnosis not present

## 2016-10-06 DIAGNOSIS — Z950 Presence of cardiac pacemaker: Secondary | ICD-10-CM | POA: Diagnosis not present

## 2016-10-06 DIAGNOSIS — E785 Hyperlipidemia, unspecified: Secondary | ICD-10-CM | POA: Diagnosis not present

## 2016-10-06 DIAGNOSIS — I5032 Chronic diastolic (congestive) heart failure: Secondary | ICD-10-CM | POA: Diagnosis not present

## 2016-10-06 DIAGNOSIS — I110 Hypertensive heart disease with heart failure: Secondary | ICD-10-CM | POA: Diagnosis not present

## 2016-10-07 DIAGNOSIS — J441 Chronic obstructive pulmonary disease with (acute) exacerbation: Secondary | ICD-10-CM | POA: Diagnosis not present

## 2016-10-07 DIAGNOSIS — E785 Hyperlipidemia, unspecified: Secondary | ICD-10-CM | POA: Diagnosis not present

## 2016-10-07 DIAGNOSIS — I5032 Chronic diastolic (congestive) heart failure: Secondary | ICD-10-CM | POA: Diagnosis not present

## 2016-10-07 DIAGNOSIS — I480 Paroxysmal atrial fibrillation: Secondary | ICD-10-CM | POA: Diagnosis not present

## 2016-10-07 DIAGNOSIS — I110 Hypertensive heart disease with heart failure: Secondary | ICD-10-CM | POA: Diagnosis not present

## 2016-10-07 DIAGNOSIS — Z950 Presence of cardiac pacemaker: Secondary | ICD-10-CM | POA: Diagnosis not present

## 2016-10-08 ENCOUNTER — Encounter (HOSPITAL_COMMUNITY): Payer: Self-pay

## 2016-10-08 ENCOUNTER — Inpatient Hospital Stay (HOSPITAL_COMMUNITY)
Admission: EM | Admit: 2016-10-08 | Discharge: 2016-10-15 | DRG: 291 | Disposition: A | Discharge status: Hospice | Payer: Medicare Other | Attending: Internal Medicine | Admitting: Internal Medicine

## 2016-10-08 ENCOUNTER — Emergency Department (HOSPITAL_COMMUNITY): Payer: Medicare Other

## 2016-10-08 DIAGNOSIS — R531 Weakness: Secondary | ICD-10-CM | POA: Diagnosis not present

## 2016-10-08 DIAGNOSIS — I482 Chronic atrial fibrillation, unspecified: Secondary | ICD-10-CM | POA: Diagnosis present

## 2016-10-08 DIAGNOSIS — Z7901 Long term (current) use of anticoagulants: Secondary | ICD-10-CM | POA: Diagnosis not present

## 2016-10-08 DIAGNOSIS — Z950 Presence of cardiac pacemaker: Secondary | ICD-10-CM | POA: Diagnosis not present

## 2016-10-08 DIAGNOSIS — J9621 Acute and chronic respiratory failure with hypoxia: Secondary | ICD-10-CM | POA: Diagnosis not present

## 2016-10-08 DIAGNOSIS — J441 Chronic obstructive pulmonary disease with (acute) exacerbation: Secondary | ICD-10-CM | POA: Diagnosis not present

## 2016-10-08 DIAGNOSIS — I5033 Acute on chronic diastolic (congestive) heart failure: Secondary | ICD-10-CM | POA: Diagnosis present

## 2016-10-08 DIAGNOSIS — Z66 Do not resuscitate: Secondary | ICD-10-CM | POA: Diagnosis present

## 2016-10-08 DIAGNOSIS — Z9981 Dependence on supplemental oxygen: Secondary | ICD-10-CM | POA: Diagnosis not present

## 2016-10-08 DIAGNOSIS — Z885 Allergy status to narcotic agent status: Secondary | ICD-10-CM

## 2016-10-08 DIAGNOSIS — I4892 Unspecified atrial flutter: Secondary | ICD-10-CM | POA: Diagnosis present

## 2016-10-08 DIAGNOSIS — I130 Hypertensive heart and chronic kidney disease with heart failure and stage 1 through stage 4 chronic kidney disease, or unspecified chronic kidney disease: Secondary | ICD-10-CM | POA: Diagnosis not present

## 2016-10-08 DIAGNOSIS — R404 Transient alteration of awareness: Secondary | ICD-10-CM | POA: Diagnosis not present

## 2016-10-08 DIAGNOSIS — Z880 Allergy status to penicillin: Secondary | ICD-10-CM

## 2016-10-08 DIAGNOSIS — Z8249 Family history of ischemic heart disease and other diseases of the circulatory system: Secondary | ICD-10-CM

## 2016-10-08 DIAGNOSIS — I10 Essential (primary) hypertension: Secondary | ICD-10-CM

## 2016-10-08 DIAGNOSIS — J449 Chronic obstructive pulmonary disease, unspecified: Secondary | ICD-10-CM | POA: Diagnosis not present

## 2016-10-08 DIAGNOSIS — J9622 Acute and chronic respiratory failure with hypercapnia: Secondary | ICD-10-CM | POA: Diagnosis not present

## 2016-10-08 DIAGNOSIS — Z7951 Long term (current) use of inhaled steroids: Secondary | ICD-10-CM

## 2016-10-08 DIAGNOSIS — E875 Hyperkalemia: Secondary | ICD-10-CM | POA: Diagnosis present

## 2016-10-08 DIAGNOSIS — Z881 Allergy status to other antibiotic agents status: Secondary | ICD-10-CM

## 2016-10-08 DIAGNOSIS — J9811 Atelectasis: Secondary | ICD-10-CM | POA: Diagnosis present

## 2016-10-08 DIAGNOSIS — Z808 Family history of malignant neoplasm of other organs or systems: Secondary | ICD-10-CM | POA: Diagnosis not present

## 2016-10-08 DIAGNOSIS — I5032 Chronic diastolic (congestive) heart failure: Secondary | ICD-10-CM | POA: Diagnosis not present

## 2016-10-08 DIAGNOSIS — Z801 Family history of malignant neoplasm of trachea, bronchus and lung: Secondary | ICD-10-CM

## 2016-10-08 DIAGNOSIS — F419 Anxiety disorder, unspecified: Secondary | ICD-10-CM | POA: Diagnosis present

## 2016-10-08 DIAGNOSIS — Z7401 Bed confinement status: Secondary | ICD-10-CM | POA: Diagnosis not present

## 2016-10-08 DIAGNOSIS — Z87891 Personal history of nicotine dependence: Secondary | ICD-10-CM

## 2016-10-08 DIAGNOSIS — J80 Acute respiratory distress syndrome: Secondary | ICD-10-CM | POA: Diagnosis not present

## 2016-10-08 DIAGNOSIS — I480 Paroxysmal atrial fibrillation: Secondary | ICD-10-CM | POA: Diagnosis present

## 2016-10-08 DIAGNOSIS — I959 Hypotension, unspecified: Secondary | ICD-10-CM | POA: Diagnosis not present

## 2016-10-08 DIAGNOSIS — J9620 Acute and chronic respiratory failure, unspecified whether with hypoxia or hypercapnia: Secondary | ICD-10-CM | POA: Diagnosis not present

## 2016-10-08 DIAGNOSIS — J432 Centrilobular emphysema: Secondary | ICD-10-CM | POA: Diagnosis present

## 2016-10-08 DIAGNOSIS — I509 Heart failure, unspecified: Secondary | ICD-10-CM | POA: Diagnosis not present

## 2016-10-08 DIAGNOSIS — R339 Retention of urine, unspecified: Secondary | ICD-10-CM | POA: Diagnosis not present

## 2016-10-08 DIAGNOSIS — Z803 Family history of malignant neoplasm of breast: Secondary | ICD-10-CM

## 2016-10-08 DIAGNOSIS — W1830XA Fall on same level, unspecified, initial encounter: Secondary | ICD-10-CM | POA: Diagnosis present

## 2016-10-08 DIAGNOSIS — I481 Persistent atrial fibrillation: Secondary | ICD-10-CM | POA: Diagnosis present

## 2016-10-08 DIAGNOSIS — Z466 Encounter for fitting and adjustment of urinary device: Secondary | ICD-10-CM | POA: Diagnosis not present

## 2016-10-08 DIAGNOSIS — Z888 Allergy status to other drugs, medicaments and biological substances status: Secondary | ICD-10-CM

## 2016-10-08 DIAGNOSIS — N183 Chronic kidney disease, stage 3 unspecified: Secondary | ICD-10-CM

## 2016-10-08 DIAGNOSIS — E874 Mixed disorder of acid-base balance: Secondary | ICD-10-CM | POA: Diagnosis present

## 2016-10-08 DIAGNOSIS — I2699 Other pulmonary embolism without acute cor pulmonale: Secondary | ICD-10-CM

## 2016-10-08 DIAGNOSIS — Z5320 Procedure and treatment not carried out because of patient's decision for unspecified reasons: Secondary | ICD-10-CM | POA: Diagnosis not present

## 2016-10-08 DIAGNOSIS — S51812A Laceration without foreign body of left forearm, initial encounter: Secondary | ICD-10-CM | POA: Diagnosis not present

## 2016-10-08 DIAGNOSIS — I495 Sick sinus syndrome: Secondary | ICD-10-CM | POA: Diagnosis not present

## 2016-10-08 DIAGNOSIS — E785 Hyperlipidemia, unspecified: Secondary | ICD-10-CM | POA: Diagnosis present

## 2016-10-08 DIAGNOSIS — S51811A Laceration without foreign body of right forearm, initial encounter: Secondary | ICD-10-CM | POA: Diagnosis present

## 2016-10-08 DIAGNOSIS — R0602 Shortness of breath: Secondary | ICD-10-CM | POA: Diagnosis not present

## 2016-10-08 DIAGNOSIS — Z515 Encounter for palliative care: Secondary | ICD-10-CM | POA: Diagnosis not present

## 2016-10-08 DIAGNOSIS — R0600 Dyspnea, unspecified: Secondary | ICD-10-CM | POA: Diagnosis not present

## 2016-10-08 DIAGNOSIS — I4891 Unspecified atrial fibrillation: Secondary | ICD-10-CM | POA: Diagnosis not present

## 2016-10-08 LAB — CBC WITH DIFFERENTIAL/PLATELET
BASOS PCT: 0 %
Basophils Absolute: 0 10*3/uL (ref 0.0–0.1)
EOS ABS: 0 10*3/uL (ref 0.0–0.7)
Eosinophils Relative: 0 %
HEMATOCRIT: 32.4 % — AB (ref 36.0–46.0)
HEMOGLOBIN: 9.5 g/dL — AB (ref 12.0–15.0)
LYMPHS ABS: 1.3 10*3/uL (ref 0.7–4.0)
Lymphocytes Relative: 20 %
MCH: 28.3 pg (ref 26.0–34.0)
MCHC: 29.3 g/dL — ABNORMAL LOW (ref 30.0–36.0)
MCV: 96.4 fL (ref 78.0–100.0)
MONOS PCT: 11 %
Monocytes Absolute: 0.7 10*3/uL (ref 0.1–1.0)
NEUTROS PCT: 69 %
Neutro Abs: 4.3 10*3/uL (ref 1.7–7.7)
Platelets: 204 10*3/uL (ref 150–400)
RBC: 3.36 MIL/uL — ABNORMAL LOW (ref 3.87–5.11)
RDW: 16.4 % — AB (ref 11.5–15.5)
WBC: 6.2 10*3/uL (ref 4.0–10.5)

## 2016-10-08 LAB — BASIC METABOLIC PANEL
Anion gap: 5 (ref 5–15)
BUN: 17 mg/dL (ref 6–20)
CALCIUM: 9.4 mg/dL (ref 8.9–10.3)
CHLORIDE: 102 mmol/L (ref 101–111)
CO2: 32 mmol/L (ref 22–32)
CREATININE: 1.02 mg/dL — AB (ref 0.44–1.00)
GFR calc Af Amer: 58 mL/min — ABNORMAL LOW (ref >60)
GFR calc non Af Amer: 50 mL/min — ABNORMAL LOW (ref >60)
GLUCOSE: 119 mg/dL — AB (ref 65–99)
Potassium: 6 mmol/L — ABNORMAL HIGH (ref 3.5–5.1)
Sodium: 139 mmol/L (ref 135–145)

## 2016-10-08 LAB — I-STAT TROPONIN, ED: TROPONIN I, POC: 0 ng/mL (ref 0.00–0.08)

## 2016-10-08 LAB — PROTIME-INR
INR: 3.57
PROTHROMBIN TIME: 36.6 s — AB (ref 11.4–15.2)

## 2016-10-08 LAB — BRAIN NATRIURETIC PEPTIDE: B Natriuretic Peptide: 541.1 pg/mL — ABNORMAL HIGH (ref 0.0–100.0)

## 2016-10-08 MED ORDER — SODIUM CHLORIDE 0.9% FLUSH
3.0000 mL | Freq: Two times a day (BID) | INTRAVENOUS | Status: DC
  Administered 2016-10-09 – 2016-10-15 (×13): 3 mL via INTRAVENOUS

## 2016-10-08 MED ORDER — ONDANSETRON HCL 4 MG/2ML IJ SOLN
4.0000 mg | Freq: Four times a day (QID) | INTRAMUSCULAR | Status: DC | PRN
  Administered 2016-10-12: 4 mg via INTRAVENOUS
  Filled 2016-10-08: qty 2

## 2016-10-08 MED ORDER — SODIUM CHLORIDE 0.9% FLUSH: 3.0000 mL | INTRAVENOUS | Status: DC | PRN

## 2016-10-08 MED ORDER — BUDESONIDE 0.25 MG/2ML IN SUSP
0.2500 mg | Freq: Two times a day (BID) | RESPIRATORY_TRACT | Status: DC
  Administered 2016-10-08 – 2016-10-15 (×13): 0.25 mg via RESPIRATORY_TRACT
  Filled 2016-10-08 (×13): qty 2

## 2016-10-08 MED ORDER — ACETAMINOPHEN 325 MG PO TABS
650.0000 mg | ORAL_TABLET | ORAL | Status: DC | PRN
  Administered 2016-10-09: 650 mg via ORAL
  Filled 2016-10-08: qty 2

## 2016-10-08 MED ORDER — LORATADINE 10 MG PO TABS
10.0000 mg | ORAL_TABLET | Freq: Every day | ORAL | Status: DC
  Administered 2016-10-10 – 2016-10-15 (×5): 10 mg via ORAL
  Filled 2016-10-08 (×6): qty 1

## 2016-10-08 MED ORDER — BENZOCAINE 10 % MT GEL
Freq: Four times a day (QID) | OROMUCOSAL | Status: DC | PRN
  Filled 2016-10-08: qty 9.4

## 2016-10-08 MED ORDER — SODIUM CHLORIDE 0.9 % IV SOLN: 250.0000 mL | INTRAVENOUS | Status: DC | PRN

## 2016-10-08 MED ORDER — FUROSEMIDE 10 MG/ML IJ SOLN
40.0000 mg | Freq: Once | INTRAMUSCULAR | Status: DC
  Filled 2016-10-08: qty 4

## 2016-10-08 MED ORDER — SOTALOL HCL 80 MG PO TABS
80.0000 mg | ORAL_TABLET | Freq: Two times a day (BID) | ORAL | Status: DC
  Administered 2016-10-08 – 2016-10-15 (×11): 80 mg via ORAL
  Filled 2016-10-08 (×14): qty 1

## 2016-10-08 MED ORDER — DILTIAZEM HCL ER COATED BEADS 180 MG PO CP24
300.0000 mg | ORAL_CAPSULE | Freq: Two times a day (BID) | ORAL | Status: DC
  Administered 2016-10-08 – 2016-10-15 (×11): 300 mg via ORAL
  Filled 2016-10-08 (×12): qty 1

## 2016-10-08 MED ORDER — FUROSEMIDE 10 MG/ML IJ SOLN
40.0000 mg | Freq: Two times a day (BID) | INTRAMUSCULAR | Status: DC
  Administered 2016-10-08 – 2016-10-11 (×6): 40 mg via INTRAVENOUS
  Filled 2016-10-08 (×6): qty 4

## 2016-10-08 MED ORDER — IPRATROPIUM-ALBUTEROL 0.5-2.5 (3) MG/3ML IN SOLN: 3.0000 mL | RESPIRATORY_TRACT | Status: DC | PRN

## 2016-10-08 MED ORDER — ALPRAZOLAM 0.25 MG PO TABS
0.2500 mg | ORAL_TABLET | Freq: Three times a day (TID) | ORAL | Status: DC | PRN
  Administered 2016-10-09 – 2016-10-11 (×4): 0.25 mg via ORAL
  Filled 2016-10-08 (×4): qty 1

## 2016-10-08 MED ORDER — IPRATROPIUM-ALBUTEROL 0.5-2.5 (3) MG/3ML IN SOLN: 3.0000 mL | Freq: Four times a day (QID) | RESPIRATORY_TRACT | Status: DC | PRN

## 2016-10-08 MED ORDER — ARFORMOTEROL TARTRATE 15 MCG/2ML IN NEBU
15.0000 ug | INHALATION_SOLUTION | Freq: Two times a day (BID) | RESPIRATORY_TRACT | Status: DC
  Administered 2016-10-08 – 2016-10-12 (×8): 15 ug via RESPIRATORY_TRACT
  Filled 2016-10-08 (×8): qty 2

## 2016-10-08 MED ORDER — METOPROLOL TARTRATE 25 MG PO TABS
25.0000 mg | ORAL_TABLET | Freq: Two times a day (BID) | ORAL | Status: DC
  Administered 2016-10-08 – 2016-10-12 (×7): 25 mg via ORAL
  Filled 2016-10-08 (×8): qty 1

## 2016-10-08 NOTE — Progress Notes (Signed)
Patient only voided 50 ccs after receiving IV Lasix, bladder scan performed reading greater than 500.  Placed patient on bedpan, urinated 250 ccs.  Bladder scan read 504 after patient voided.  Patient arrived on unit with order for foley catheter.  Foley catheter to be placed d/t urinary retention.

## 2016-10-08 NOTE — ED Triage Notes (Signed)
Patient transported to the ED via [facility]. EMS reports the patient had a pacemaker placed 1 month ago and was D/C from the hospital last week.The patient reports to EMS she began feeling progressively weak and fatigued over the last 3 days. She reports increasing shortness of breath over the past 3 days as well. Patient takes Coumadin.

## 2016-10-08 NOTE — Progress Notes (Signed)
ANTICOAGULATION CONSULT NOTE - Initial Consult  Pharmacy Consult for coumadin  Indication: atrial fibrillation   Allergies  Allergen Reactions  . Augmentin [Amoxicillin-Pot Clavulanate] Shortness Of Breath  . Doxycycline Shortness Of Breath  . Nitrofurantoin Shortness Of Breath  . Acyclovir And Related   . Amiodarone Hcl Nausea And Vomiting  . Clarithromycin Other (See Comments)    Crazy feeling, confusion  . Clonazepam Other (See Comments)    Doesn't remember reaction  . Codeine Nausea And Vomiting  . Diltiazem Hcl Nausea And Vomiting  . Fish Oil Nausea And Vomiting  . Flecainide Nausea Only  . Multaq [Dronedarone] Nausea Only  . Rosuvastatin Other (See Comments)    Leg cramps   . Zetia [Ezetimibe] Other (See Comments)    Leg cramps    Patient Measurements: Height: 5\' 2"  (157.5 cm) Weight: 130 lb (59 kg) IBW/kg (Calculated) : 50.1   Vital Signs: Temp: 98.9 F (37.2 C) (02/25 1415) Temp Source: Oral (02/25 1415) BP: 127/61 (02/25 1800) Pulse Rate: 91 (02/25 1800)  Labs:  Recent Labs  10/08/16 1449 10/08/16 1815  HGB 9.5*  --   HCT 32.4*  --   PLT 204  --   LABPROT  --  36.6*  INR  --  3.57  CREATININE 1.02*  --     Estimated Creatinine Clearance: 34.2 mL/min (by C-G formula based on SCr of 1.02 mg/dL (H)).   Medical History: Past Medical History:  Diagnosis Date  . Abdominal pain, other specified site    [place] - negative abdominal ultrasound  . Abnormal chest x-ray   . Chest pain 01/19/2009   myoview stress - LVEF >75% no reversible ischemia or infarction  . COPD (chronic obstructive pulmonary disease) ([place])   . Dyslipidemia   . Hypertension   . Paroxysmal atrial flutter ([place]) 01/19/2009   echo - EF 0000000; grade 1 diastolic dysfunction; mitral valve- calcified annlus, mildly dilated R ventricle and atrium  . Pneumonia    hx  . PONV (postoperative nausea and vomiting)   . Tremor, essential 09/07/2015    [name] admitted for SOB on  PTA warfarin for Afib. Pharmacy consulted to dose warfarin.  INR on admission is elevated at 3.57 with last dose taken on 2/24. CBC stable.  PTA dose: 2 mg on MWF and 1 mg AOD's per last clinic note  Goal of Therapy:  INR 2-3 Monitor platelets by anticoagulation protocol: Yes   Plan:  1. Hold warfarin this evening 2. Daily INR  Claudia [name], PharmD, BCPS 10/08/2016, 8:06 PM

## 2016-10-08 NOTE — ED Provider Notes (Signed)
[facility] DEPT Provider Note   CSN: [id number] Arrival date & time: 10/08/16  1354     History   Chief Complaint Chief Complaint  Patient presents with  . Weakness  . Shortness of Breath    HPI Claudia [name] is a 83 y.o. female.  The history is provided by the patient.  Shortness of Breath  This is a recurrent problem. The average episode lasts 2 days. The current episode started yesterday. The problem has been gradually worsening. Associated symptoms include wheezing and leg swelling. Pertinent negatives include no fever, no rhinorrhea, no sore throat, no ear pain, no cough, no sputum production, no chest pain, no vomiting, no abdominal pain and no rash. It is unknown what precipitated the problem. Risk factors: Recent pacemaker placement, recent PTX. She has tried beta-agonist inhalers for the symptoms. The treatment provided no relief. She has had prior hospitalizations. She has had prior ED visits. Associated medical issues include COPD, chronic lung disease and recent surgery.    Past Medical History:  Diagnosis Date  . Abdominal pain, other specified site    [place] - negative abdominal ultrasound  . Abnormal chest x-ray   . Chest pain 01/19/2009   myoview stress - LVEF >75% no reversible ischemia or infarction  . COPD (chronic obstructive pulmonary disease) ([place])   . Dyslipidemia   . Hypertension   . Paroxysmal atrial flutter ([place]) 01/19/2009   echo - EF 0000000; grade 1 diastolic dysfunction; mitral valve- calcified annlus, mildly dilated R ventricle and atrium  . Pneumonia    hx  . PONV (postoperative nausea and vomiting)   . Tremor, essential 09/07/2015    Patient Active Problem List   Diagnosis Date Noted  . CHF (congestive heart failure) ([place]) 10/08/2016  . CKD (chronic kidney disease), stage III 10/08/2016  . Oral herpes simplex infection   . Oral thrush 09/23/2016  . Leukocytosis 09/21/2016  . Cardiac pacemaker in situ   . S/P chest tube placement ([place])   .  Pneumothorax, traumatic   . Acute delirium   . Acute respiratory failure with hypoxemia ([place])   . Chronic atrial fibrillation ([place])   . Hypoxia   . Acute respiratory distress   . Tachy-brady syndrome ([place])   . COPD exacerbation ([place]) 09/09/2016  . Chronic diastolic CHF (congestive heart failure) ([place]) 09/09/2016  . COPD with acute exacerbation ([place]) 09/09/2016  . Tremor, essential 09/07/2015  . Acute on chronic respiratory failure ([place]) 07/06/2015  . Essential and other specified forms of tremor 06/17/2013  . Disturbance of skin sensation 06/17/2013  . Paroxysmal atrial fibrillation ([place]) 02/01/2013  . HTN (hypertension) 02/01/2013  . Hyperlipidemia 02/01/2013  . Long term current use of anticoagulant therapy 10/30/2012  . Ductal hyperplasia, atypical, breast 09/24/2012  . Dysfunction of eustachian tube 10/16/2011  . SOB (shortness of breath) 05/30/2010  . RHINITIS 05/23/2010  . ATRIAL FLUTTER, PAROXYSMAL 05/24/2007  . COPD with asthma ([place]) 05/24/2007  . Nonspecific (abnormal) findings on radiological and other examination of body structure 05/24/2007    Past Surgical History:  Procedure Laterality Date  . BREAST LUMPECTOMY WITH NEEDLE LOCALIZATION Right 10/02/2012   Procedure: BREAST LUMPECTOMY WITH NEEDLE LOCALIZATION;  Surgeon: Harl Bowie, MD;  Location: [facility];  Service: General;  Laterality: Right;  . CATARACT EXTRACTION    . EYE SURGERY    . PACEMAKER IMPLANT N/A 09/18/2016   Procedure: Pacemaker Implant;  Surgeon: Will Meredith Leeds, MD;  Location: [facility];  Service: Cardiovascular;  Laterality:  N/A;  . TONSILLECTOMY    . TONSILLECTOMY    . TUBAL LIGATION      OB History    No data available       Home Medications    Prior to Admission medications   Medication Sig Start Date End Date Taking? Authorizing Provider  acetaminophen (TYLENOL) 325 MG tablet Take 650 mg by mouth every 6 (six) hours as needed for pain.   Yes Historical Provider, MD    albuterol (PROAIR HFA) 108 (90 Base) MCG/ACT inhaler INHALE 2 PUFFS EVERY 6 HOURS AS NEEDED FOR WHEEZING OR SHORTNESS OF BREATH 01/04/16  Yes Deneise Lever, MD  ALPRAZolam Duanne Moron) 0.25 MG tablet TAKE 1 TABLET BY MOUTH THREE TIMES DAILY AS NEEDED 06/12/16  Yes Kathrynn Ducking, MD  arformoterol (BROVANA) 15 MCG/2ML NEBU Take 2 mLs (15 mcg total) by nebulization 2 (two) times daily. 09/30/16  Yes Dron Tanna Furry, MD  budesonide (PULMICORT) 0.25 MG/2ML nebulizer solution Take 2 mLs (0.25 mg total) by nebulization 2 (two) times daily. 09/30/16  Yes Dron Tanna Furry, MD  carboxymethylcellulose (REFRESH PLUS) 0.5 % SOLN Place 1-2 drops into both eyes 2 (two) times daily.   Yes Historical Provider, MD  diltiazem (CARDIZEM CD) 300 MG 24 hr capsule Take 1 capsule (300 mg total) by mouth 2 (two) times daily. 09/30/16  Yes Dron Tanna Furry, MD  loratadine (CLARITIN) 10 MG tablet Take 1 tablet (10 mg total) by mouth daily. 10/01/16  Yes Dron Tanna Furry, MD  metoprolol tartrate (LOPRESSOR) 25 MG tablet Take 1 tablet (25 mg total) by mouth every 6 (six) hours. Patient taking differently: Take 25 mg by mouth every 6 (six) hours. Take 4 times daily per patient and family 09/30/16  Yes Dron Tanna Furry, MD  OXYGEN Inhale 2 L into the lungs. Use @@ Bedtime   Yes Historical Provider, MD  potassium chloride SA (K-DUR,KLOR-CON) 20 MEQ tablet Take 1 tablet (20 mEq total) by mouth daily. 10/01/16  Yes Dron Tanna Furry, MD  sotalol (BETAPACE) 80 MG tablet Take 1 tablet (80 mg total) by mouth 2 (two) times daily. 09/30/16  Yes Dron Tanna Furry, MD  tiotropium (SPIRIVA HANDIHALER) 18 MCG inhalation capsule 1 inhale daily Patient taking differently: Place 18 mcg into inhaler and inhale daily.  01/04/16  Yes Deneise Lever, MD  valACYclovir (VALTREX) 1000 MG tablet Take 1 tablet (1,000 mg total) by mouth 2 (two) times daily. 09/30/16 10/10/16 Yes Dron Tanna Furry, MD  warfarin (COUMADIN) 1 MG tablet  Take 1 mg by mouth daily.   Yes Historical Provider, MD  benzocaine (ORAJEL) 10 % mucosal gel Use as directed in the mouth or throat 4 (four) times daily as needed for mouth pain (to gums prn). Patient not taking: Reported on 10/08/2016 09/30/16   Dron Tanna Furry, MD  warfarin (COUMADIN) 1 MG tablet TAKE 1 TO 2 TABLETS BY MOUTH EVERY DAY AS DIRECTED BY COUMADIN CLINIC Patient not taking: Reported on 10/08/2016 04/11/16   Sanda Klein, MD    Family History Family History  Problem Relation Age of Onset  . Bone cancer Mother   . Cancer Mother     bone  . Bone cancer Father   . Lung cancer Father   . Cancer Father     Lung  . Hyperlipidemia Father   . Hypertension Father   . Hypertension Sister   . Cancer Sister     breast  . Hyperlipidemia Sister   . Hyperlipidemia Sister   .  Alzheimer's disease Sister   . Hypertension Sister   . Hyperlipidemia Brother   . Hypertension Brother   . Breast cancer Sister     Social History Social History  Substance Use Topics  . Smoking status: Former Smoker    Packs/day: 2.00    Years: 25.00    Types: Cigarettes    Quit date: 08/15/1987  . Smokeless tobacco: Never Used  . Alcohol use No     Allergies   Augmentin [amoxicillin-pot clavulanate]; Doxycycline; Nitrofurantoin; Acyclovir and related; Amiodarone hcl; Clarithromycin; Clonazepam; Codeine; Diltiazem hcl; Fish oil; Flecainide; Multaq [dronedarone]; Rosuvastatin; and Zetia [ezetimibe]   Review of Systems Review of Systems  Constitutional: Negative for chills and fever.  HENT: Negative for ear pain, rhinorrhea and sore throat.   Eyes: Negative for pain and visual disturbance.  Respiratory: Positive for shortness of breath and wheezing. Negative for cough and sputum production.   Cardiovascular: Positive for leg swelling. Negative for chest pain and palpitations.  Gastrointestinal: Negative for abdominal pain and vomiting.  Genitourinary: Negative for dysuria and hematuria.    Musculoskeletal: Negative for arthralgias and back pain.  Skin: Positive for wound. Negative for color change and rash.  Neurological: Positive for weakness. Negative for seizures and syncope.  All other systems reviewed and are negative.    Physical Exam Updated Vital Signs BP 130/71   Pulse 80   Temp 98.9 F (37.2 C) (Oral)   Resp (!) 29   Ht 5\' 2"  (1.575 m)   Wt 59 kg   SpO2 98%   BMI 23.78 kg/m   Physical Exam  Constitutional: She is oriented to person, place, and time. She appears well-developed and well-nourished. No distress.  HENT:  Head: Normocephalic and atraumatic.  Eyes: Conjunctivae are normal.  Neck: Neck supple.  Cardiovascular: Normal rate, regular rhythm, normal heart sounds and intact distal pulses.   No murmur heard. Pulmonary/Chest: She is in respiratory distress. She has no wheezes. She has no rales.  Mild tachypnea. Poor air movement throughout, but symmetric lung sounds  Abdominal: Soft. There is no tenderness.  Musculoskeletal: She exhibits edema. She exhibits no tenderness or deformity.  2+ pitting edema to knees  Neurological: She is alert and oriented to person, place, and time.  Skin: Skin is warm and dry.  Large skin tear to left forearm  Psychiatric: She has a normal mood and affect.  Nursing note and vitals reviewed.    ED Treatments / Results  Labs (all labs ordered are listed, but only abnormal results are displayed) Labs Reviewed  BASIC METABOLIC PANEL - Abnormal; Notable for the following:       Result Value   Potassium 6.0 (*)    Glucose, Bld 119 (*)    Creatinine, Ser 1.02 (*)    GFR calc non Af Amer 50 (*)    GFR calc Af Amer 58 (*)    All other components within normal limits  CBC WITH DIFFERENTIAL/PLATELET - Abnormal; Notable for the following:    RBC 3.36 (*)    Hemoglobin 9.5 (*)    HCT 32.4 (*)    MCHC 29.3 (*)    RDW 16.4 (*)    All other components within normal limits  BRAIN NATRIURETIC PEPTIDE - Abnormal;  Notable for the following:    B Natriuretic Peptide 541.1 (*)    All other components within normal limits  BASIC METABOLIC PANEL  PROTIME-INR  PROTIME-INR  CBC  I-STAT TROPOININ, ED    EKG  EKG Interpretation  Date/Time:  Sunday October 08 2016 14:03:58 EST Ventricular Rate:  75 PR Interval:    QRS Duration: 85 QT Interval:  346 QTC Calculation: 426 R Axis:   55 Text Interpretation:  Afib/flut and V-paced complexes Borderline repolarization abnormality Confirmed by KNOTT MD, DANIEL [id number]) on 10/08/2016 2:29:47 PM       Radiology Dg Chest 2 View  Result Date: 10/08/2016 CLINICAL DATA:  Pt reports shortness of breath for several days; she reports h/o pacemaker insertion earlier this month, HTN, and COPD; former smoker EXAM: CHEST  2 VIEW COMPARISON:  09/23/2016 FINDINGS: Small bilateral pleural effusions, larger on the left. There is associated parenchymal lung base opacity that is likely atelectasis. Pneumonia is not excluded but felt less likely. There is linear opacity in both upper lobes which is new from prior study, consistent with atelectasis. Bilateral interstitial prominence is stable. There is no overt pulmonary edema. No pneumothorax. Cardiac silhouette is mildly enlarged. No mediastinal or hilar masses. Left anterior chest wall sequential pacemaker is stable and well positioned. Skeletal structures are demineralized but grossly intact. IMPRESSION: 1. There small bilateral pleural effusions, larger on the left. There is additional opacity at the lung bases and in the upper lobes that is likely atelectasis. The infection is possible. These findings are new. 2. Mild cardiomegaly.  No convincing pulmonary edema. Electronically Signed   By: Lajean Manes M.D.   On: 10/08/2016 15:36    Procedures .Marland KitchenLaceration Repair Date/Time: 10/08/2016 5:40 PM Performed by: Clifton James Authorized by: Clifton James   Consent:    Consent obtained:  Verbal   Consent given  by:  Patient   Risks discussed:  Poor wound healing and need for additional repair   Alternatives discussed:  No treatment and delayed treatment Anesthesia (see MAR for exact dosages):    Anesthesia method:  None Laceration details:    Location:  Shoulder/arm   Shoulder/arm location:  R lower arm   Length (cm):  10   Depth (mm):  1 Repair type:    Repair type:  Simple Pre-procedure details:    Preparation:  Patient was prepped and draped in usual sterile fashion Exploration:    Wound exploration: entire depth of wound probed and visualized     Contaminated: yes   Treatment:    Amount of cleaning:  Standard   Irrigation solution:  Sterile saline   Irrigation volume:  553ml   Irrigation method:  Syringe   Visualized foreign bodies/material removed: yes   Skin repair:    Repair method:  Tissue adhesive Approximation:    Approximation:  Close   Vermilion border: well-aligned   Post-procedure details:    Dressing:  Open (no dressing)   Patient tolerance of procedure:  Tolerated well, no immediate complications   (including critical care time)  Medications Ordered in ED Medications  ipratropium-albuterol (DUONEB) 0.5-2.5 (3) MG/3ML nebulizer solution 3 mL (not administered)  sodium chloride flush (NS) 0.9 % injection 3 mL (not administered)  sodium chloride flush (NS) 0.9 % injection 3 mL (not administered)  0.9 %  sodium chloride infusion (not administered)  acetaminophen (TYLENOL) tablet 650 mg (not administered)  ondansetron (ZOFRAN) injection 4 mg (not administered)  furosemide (LASIX) injection 40 mg (40 mg Intravenous Given 10/08/16 1733)     Initial Impression / Assessment and Plan / ED Course  I have reviewed the triage vital signs and the nursing notes.  Pertinent labs & imaging results that were available during my care of the patient were  reviewed by me and considered in my medical decision making (see chart for details).    Pt p/w 2 days of worsening SOB and  generalized weakness. Has been home for 1 week after hospitalization where pacemaker was placed, complicated by L ptx, s/p chest tube. No fever, chills, or infectious sx. No chest pain or palpitations. She does have new onset b/l LE edema, concern for new onset heart failure.  Workup revealed b/l pleural effusions. BNP elevated. No hx of heart failure. Skin tear closed as above with Dermabond. Lasix given. Pt admitted to hospitalist for further workup and treatment.  Final Clinical Impressions(s) / ED Diagnoses   Final diagnoses:  Shortness of breath    New Prescriptions New Prescriptions   No medications on file     Clifton James, MD 10/08/16 [place], MD 10/08/16 [phone]

## 2016-10-08 NOTE — H&P (Addendum)
History and Physical    Claudia [name] [id number] DOB: [birth year] DOA: 10/08/2016  PCP: Pcp Not In System  Outpatient Specialists: cardiology, Dr. Sallyanne Kuster Patient coming from: home  Chief Complaint: shortness of breath   HPI: Claudia [name] is a 83 y.o. female with medical history significant of COPD, paroxysmal A. fib, hypertension, hyperlipidemia, tachybradycardia syndrome status post pacemaker placement in the last 2 weeks, was discharged home about a week ago from a prolonged hospitalization, requiring PPM placement which was complicated by pneumothorax, COPD exacerbation, A. fib with RVR, steroid-induced delirium, and acute kidney injury.  For full hospital course, refer to the discharge summary on 09/30/2016.  On discharge, she felt well, she went home and had couple good days, however in the last 3-4 days she has noticed progressively worsening shortness of breath with minimal activity, as well as progressive bilateral lower extremity swelling, which is new.  She also is complaining of generalized weakness, and had a fall today and the laceration of the right forearm.  He denies passing out, and thinks she tripped on something but she is not sure.  She denies any fever or chills, she has no cough or chest congestion.  She denies any sputum production.  She has no abdominal pain, no nausea, vomiting or diarrhea.  She denies any numbness or focal weakness.  ED Course: In the emergency room, patient is tachypneic, hypoxia requiring supplemental oxygenation, she had a chest x-ray which showed small bilateral pleural effusion, cardiomegaly, her blood work has a creatinine of 1.02, BNP is elevated at 541, and she has a potassium of 6.0.  She received IV Lasix 1, and TRH is asked for admission for new onset CHF/diastolic CHF exacerbation.  Review of Systems: As per HPI otherwise 10 point review of systems negative.   Past Medical History:  Diagnosis Date  . Abdominal pain, other specified  site    [place] - negative abdominal ultrasound  . Abnormal chest x-ray   . Chest pain 01/19/2009   myoview stress - LVEF >75% no reversible ischemia or infarction  . COPD (chronic obstructive pulmonary disease) ([place])   . Dyslipidemia   . Hypertension   . Paroxysmal atrial flutter ([place]) 01/19/2009   echo - EF 0000000; grade 1 diastolic dysfunction; mitral valve- calcified annlus, mildly dilated R ventricle and atrium  . Pneumonia    hx  . PONV (postoperative nausea and vomiting)   . Tremor, essential 09/07/2015    Past Surgical History:  Procedure Laterality Date  . BREAST LUMPECTOMY WITH NEEDLE LOCALIZATION Right 10/02/2012   Procedure: BREAST LUMPECTOMY WITH NEEDLE LOCALIZATION;  Surgeon: Harl Bowie, MD;  Location: [facility];  Service: General;  Laterality: Right;  . CATARACT EXTRACTION    . EYE SURGERY    . PACEMAKER IMPLANT N/A 09/18/2016   Procedure: Pacemaker Implant;  Surgeon: Will Meredith Leeds, MD;  Location: [facility];  Service: Cardiovascular;  Laterality: N/A;  . TONSILLECTOMY    . TONSILLECTOMY    . TUBAL LIGATION       reports that she quit smoking about 29 years ago. Her smoking use included Cigarettes. She has a 50.00 pack-year smoking history. She has never used smokeless tobacco. She reports that she does not drink alcohol or use drugs.  Allergies  Allergen Reactions  . Augmentin [Amoxicillin-Pot Clavulanate] Shortness Of Breath  . Doxycycline Shortness Of Breath  . Nitrofurantoin Shortness Of Breath  . Acyclovir And Related   . Amiodarone Hcl Nausea And Vomiting  .  Clarithromycin Other (See Comments)    Crazy feeling, confusion  . Clonazepam Other (See Comments)    Doesn't remember reaction  . Codeine Nausea And Vomiting  . Diltiazem Hcl Nausea And Vomiting  . Fish Oil Nausea And Vomiting  . Flecainide Nausea Only  . Multaq [Dronedarone] Nausea Only  . Rosuvastatin Other (See Comments)    Leg cramps   . Zetia [Ezetimibe] Other (See Comments)     Leg cramps    Family History  Problem Relation Age of Onset  . Bone cancer Mother   . Cancer Mother     bone  . Bone cancer Father   . Lung cancer Father   . Cancer Father     Lung  . Hyperlipidemia Father   . Hypertension Father   . Hypertension Sister   . Cancer Sister     breast  . Hyperlipidemia Sister   . Hyperlipidemia Sister   . Alzheimer's disease Sister   . Hypertension Sister   . Hyperlipidemia Brother   . Hypertension Brother   . Breast cancer Sister     Prior to Admission medications   Medication Sig Start Date End Date Taking? Authorizing Provider  acetaminophen (TYLENOL) 325 MG tablet Take 650 mg by mouth every 6 (six) hours as needed for pain.   Yes Historical Provider, MD  albuterol (PROAIR HFA) 108 (90 Base) MCG/ACT inhaler INHALE 2 PUFFS EVERY 6 HOURS AS NEEDED FOR WHEEZING OR SHORTNESS OF BREATH 01/04/16  Yes Deneise Lever, MD  ALPRAZolam Duanne Moron) 0.25 MG tablet TAKE 1 TABLET BY MOUTH THREE TIMES DAILY AS NEEDED 06/12/16  Yes Kathrynn Ducking, MD  arformoterol (BROVANA) 15 MCG/2ML NEBU Take 2 mLs (15 mcg total) by nebulization 2 (two) times daily. 09/30/16  Yes Dron Tanna Furry, MD  budesonide (PULMICORT) 0.25 MG/2ML nebulizer solution Take 2 mLs (0.25 mg total) by nebulization 2 (two) times daily. 09/30/16  Yes Dron Tanna Furry, MD  carboxymethylcellulose (REFRESH PLUS) 0.5 % SOLN Place 1-2 drops into both eyes 2 (two) times daily.   Yes Historical Provider, MD  diltiazem (CARDIZEM CD) 300 MG 24 hr capsule Take 1 capsule (300 mg total) by mouth 2 (two) times daily. 09/30/16  Yes Dron Tanna Furry, MD  loratadine (CLARITIN) 10 MG tablet Take 1 tablet (10 mg total) by mouth daily. 10/01/16  Yes Dron Tanna Furry, MD  metoprolol tartrate (LOPRESSOR) 25 MG tablet Take 1 tablet (25 mg total) by mouth every 6 (six) hours. Patient taking differently: Take 25 mg by mouth every 6 (six) hours. Take 4 times daily per patient and family 09/30/16  Yes Dron Tanna Furry, MD  OXYGEN Inhale 2 L into the lungs. Use @@ Bedtime   Yes Historical Provider, MD  potassium chloride SA (K-DUR,KLOR-CON) 20 MEQ tablet Take 1 tablet (20 mEq total) by mouth daily. 10/01/16  Yes Dron Tanna Furry, MD  sotalol (BETAPACE) 80 MG tablet Take 1 tablet (80 mg total) by mouth 2 (two) times daily. 09/30/16  Yes Dron Tanna Furry, MD  tiotropium (SPIRIVA HANDIHALER) 18 MCG inhalation capsule 1 inhale daily Patient taking differently: Place 18 mcg into inhaler and inhale daily.  01/04/16  Yes Deneise Lever, MD  valACYclovir (VALTREX) 1000 MG tablet Take 1 tablet (1,000 mg total) by mouth 2 (two) times daily. 09/30/16 10/10/16 Yes Dron Tanna Furry, MD  warfarin (COUMADIN) 1 MG tablet Take 1 mg by mouth daily.   Yes Historical Provider, MD  benzocaine (ORAJEL) 10 % mucosal gel Use  as directed in the mouth or throat 4 (four) times daily as needed for mouth pain (to gums prn). Patient not taking: Reported on 10/08/2016 09/30/16   Dron Tanna Furry, MD  warfarin (COUMADIN) 1 MG tablet TAKE 1 TO 2 TABLETS BY MOUTH EVERY DAY AS DIRECTED BY COUMADIN CLINIC Patient not taking: Reported on 10/08/2016 04/11/16   Sanda Klein, MD    Physical Exam: Vitals:   10/08/16 1416 10/08/16 1417 10/08/16 1430 10/08/16 1445  BP:   124/58 (!) 126/53  Pulse:   71 73  Resp:   (!) 34 (!) 33  Temp:      TempSrc:      SpO2:  100% 99% 99%  Weight: 59 kg (130 lb)     Height: 5\' 2"  (1.575 m)         Constitutional: Appears in mild distress, tachypneic Vitals:   10/08/16 1416 10/08/16 1417 10/08/16 1430 10/08/16 1445  BP:   124/58 (!) 126/53  Pulse:   71 73  Resp:   (!) 34 (!) 33  Temp:      TempSrc:      SpO2:  100% 99% 99%  Weight: 59 kg (130 lb)     Height: 5\' 2"  (1.575 m)      Eyes: PERRL, lids and conjunctivae normal ENMT: Mucous membranes are moist. Posterior pharynx clear of any exudate or lesions.  Wears dentures Neck: normal, supple, no masses Respiratory: Overall  decreased breath sounds, bibasilar crackles, scant end expiratory wheezing Cardiovascular: Regular rate and rhythm, no murmurs / rubs / gallops. 2-3+ pitting LE edema. 2+ pedal pulses. +JVD Abdomen: no tenderness, no masses palpated. Bowel sounds positive.  Musculoskeletal: no clubbing / cyanosis.  Decreased muscle tone.  Skin: no rashes, lesions, ulcers. No induration.  Laceration on the right upper forehead Neurologic: CN 2-12 grossly intact. Strength 5/5 in all 4.  Psychiatric: Normal judgment and insight. Alert and oriented x 3. Normal mood.   Labs on Admission: I have personally reviewed following labs and imaging studies  CBC:  Recent Labs Lab 10/08/16 1449  WBC 6.2  NEUTROABS 4.3  HGB 9.5*  HCT 32.4*  MCV 96.4  PLT 0000000   Basic Metabolic Panel:  Recent Labs Lab 10/08/16 1449  NA 139  K 6.0*  CL 102  CO2 32  GLUCOSE 119*  BUN 17  CREATININE 1.02*  CALCIUM 9.4   GFR: Estimated Creatinine Clearance: 34.2 mL/min (by C-G formula based on SCr of 1.02 mg/dL (H)). Liver Function Tests: No results for input(s): AST, ALT, ALKPHOS, BILITOT, PROT, ALBUMIN in the last 168 hours. No results for input(s): LIPASE, AMYLASE in the last 168 hours. No results for input(s): AMMONIA in the last 168 hours. Coagulation Profile: No results for input(s): INR, PROTIME in the last 168 hours. Cardiac Enzymes: No results for input(s): CKTOTAL, CKMB, CKMBINDEX, TROPONINI in the last 168 hours. BNP (last 3 results) No results for input(s): PROBNP in the last 8760 hours. HbA1C: No results for input(s): HGBA1C in the last 72 hours. CBG: No results for input(s): GLUCAP in the last 168 hours. Lipid Profile: No results for input(s): CHOL, HDL, LDLCALC, TRIG, CHOLHDL, LDLDIRECT in the last 72 hours. Thyroid Function Tests: No results for input(s): TSH, T4TOTAL, FREET4, T3FREE, THYROIDAB in the last 72 hours. Anemia Panel: No results for input(s): VITAMINB12, FOLATE, FERRITIN, TIBC, IRON,  RETICCTPCT in the last 72 hours. Urine analysis:    Component Value Date/Time   COLORURINE YELLOW 09/22/2016 [place] 09/22/2016  0751   LABSPEC 1.018 09/22/2016 0751   PHURINE 6.0 09/22/2016 0751   GLUCOSEU NEGATIVE 09/22/2016 0751   HGBUR NEGATIVE 09/22/2016 0751   BILIRUBINUR NEGATIVE 09/22/2016 0751   KETONESUR NEGATIVE 09/22/2016 0751   PROTEINUR NEGATIVE 09/22/2016 0751   UROBILINOGEN 0.2 01/17/2009 1750   NITRITE NEGATIVE 09/22/2016 0751   LEUKOCYTESUR NEGATIVE 09/22/2016 0751   Sepsis Labs: @LABRCNTIP (procalcitonin:4,lacticidven:4) )No results found for this or any previous visit (from the past 240 hour(s)).   Radiological Exams on Admission: Dg Chest 2 View  Result Date: 10/08/2016 CLINICAL DATA:  Pt reports shortness of breath for several days; she reports h/o pacemaker insertion earlier this month, HTN, and COPD; former smoker EXAM: CHEST  2 VIEW COMPARISON:  09/23/2016 FINDINGS: Small bilateral pleural effusions, larger on the left. There is associated parenchymal lung base opacity that is likely atelectasis. Pneumonia is not excluded but felt less likely. There is linear opacity in both upper lobes which is new from prior study, consistent with atelectasis. Bilateral interstitial prominence is stable. There is no overt pulmonary edema. No pneumothorax. Cardiac silhouette is mildly enlarged. No mediastinal or hilar masses. Left anterior chest wall sequential pacemaker is stable and well positioned. Skeletal structures are demineralized but grossly intact. IMPRESSION: 1. There small bilateral pleural effusions, larger on the left. There is additional opacity at the lung bases and in the upper lobes that is likely atelectasis. The infection is possible. These findings are new. 2. Mild cardiomegaly.  No convincing pulmonary edema. Electronically Signed   By: Lajean Manes M.D.   On: 10/08/2016 15:36    EKG: Independently reviewed.  Paced  rhythm  Assessment/Plan Active Problems:   ATRIAL FLUTTER, PAROXYSMAL   Long term current use of anticoagulant therapy   HTN (hypertension)   Hyperlipidemia   Acute on chronic respiratory failure (HCC)   Tachy-brady syndrome (HCC)   Chronic atrial fibrillation (HCC)   Cardiac pacemaker in situ   CHF (congestive heart failure) (HCC)   CKD (chronic kidney disease), stage III    Acute on chronic diastolic CHF -Patient with clinical evidence of significant fluid overload with pitting lower extremity edema, crackles and JVD.  Most recent 2D echo done on 09/13/2016 showed normal EF of 60-65%, and grade 2 diastolic dysfunction.  She is not on any diuretics. -Admit patient to telemetry, placed on IV Lasix twice daily, strict I's and O's, daily weights -We will repeat a 2D echo tomorrow, if significant changes or changes to the pacemaker will need cardiology consultation  Acute on chronic hypoxic respiratory failure -Patient with increased work of breathing in the ED, oxygen support as needed, Lasix as above  Paroxysmal A. Flutter with tachybradycardia syndrome status post pacemaker placement -EKG shows underlying atrial flutter, with intermittent conduction as well as paced rhythm -Monitor on telemetry -Continue Coumadin per pharmacy -CHADS VASC score > 2 -Continue home medications including diltiazem, metoprolol, sotalol (given IV diuresis I will decrease the metoprolol dose to avoid hypotension) -During pacemaker placement last hospitalization she had a pneumothorax, today the chest x-ray was negative for pneumothorax  COPD -Patient has very minimal wheezing, she had no productive cough, no fever chills, without wheezing is related to COPD exacerbation, will avoid steroids for now given the fact that she has a history of steroid-induced delirium, provide IV Lasix as per #1 -Resume home nebulizers, place patient on duo nebs prn  Hyperkalemia -Likely due to potassium supplementation at  home, IV Lasix now, will repeat potassium in the morning for  Chronic kidney  disease stage III -Creatinine on admission 1.02, which is better than her baseline of 1.2-1.4 -Monitor renal function while diuresing.  Right forearm laceration -Status post glue placement per EDP, consult wound in the morning.  No significant bleeding noted, continue Coumadin for now   DVT prophylaxis: Coumadin Code Status: DNR Family Communication: Discussed with husband and patient's daughter at bedside Disposition Plan: Admit to telemetry Consults called: None Admission status: Inpatient, will likely require at least 2-3 days of IV diuresis, close telemetry monitoring   Marzetta Board, MD [facility] Pager [phone]  If 7PM-7AM, please contact night-coverage [email or web address] Password [facility]  10/08/2016, 4:57 PM

## 2016-10-08 NOTE — Progress Notes (Signed)
On admission patient noted to have bruises on arms, left knee, bilateral legs, bottom of left foot.  Steristrips in place to left chest status post pacemaker placement approximately 2 weeks ago.  MASD noted around anus and buttocks, barrier cream applied.

## 2016-10-09 ENCOUNTER — Inpatient Hospital Stay (HOSPITAL_COMMUNITY): Payer: Medicare Other

## 2016-10-09 ENCOUNTER — Inpatient Hospital Stay (HOSPITAL_COMMUNITY): Admit: 2016-10-09 | Payer: Medicare Other | Admitting: Nurse Practitioner

## 2016-10-09 ENCOUNTER — Encounter (HOSPITAL_COMMUNITY): Payer: Self-pay

## 2016-10-09 DIAGNOSIS — I509 Heart failure, unspecified: Secondary | ICD-10-CM

## 2016-10-09 LAB — ECHOCARDIOGRAM COMPLETE
AOASC: 34 cm
EERAT: 10.36
EWDT: 243 ms
FS: 47 % — AB (ref 28–44)
Height: 62 in
IVS/LV PW RATIO, ED: 1.14
LA diam end sys: 24 mm
LA diam index: 1.47 cm/m2
LA vol index: 23.1 mL/m2
LASIZE: 24 mm
LAVOL: 37.7 mL
LAVOLA4C: 35.6 mL
LV e' LATERAL: 11 cm/s
LVEEAVG: 10.36
LVEEMED: 10.36
LVOT SV: 47 mL
LVOT VTI: 18.4 cm
LVOT area: 2.54 cm2
LVOT diameter: 18 mm
LVOT peak vel: 93.8 cm/s
MV Dec: 243
MV Peak grad: 5 mmHg
MVPKEVEL: 114 m/s
PW: 7.82 mm — AB (ref 0.6–1.1)
RV sys press: 48 mmHg
Reg peak vel: 335 cm/s
TAPSE: 12.6 mm
TDI e' lateral: 11
TDI e' medial: 7.13
TRMAXVEL: 335 cm/s
TVMG: 277 mmHg
WEIGHTICAEL: 2126.4 [oz_av]

## 2016-10-09 LAB — BASIC METABOLIC PANEL
Anion gap: 5 (ref 5–15)
BUN: 16 mg/dL (ref 6–20)
CALCIUM: 8.9 mg/dL (ref 8.9–10.3)
CHLORIDE: 98 mmol/L — AB (ref 101–111)
CO2: 36 mmol/L — ABNORMAL HIGH (ref 22–32)
CREATININE: 0.89 mg/dL (ref 0.44–1.00)
GFR calc non Af Amer: 59 mL/min — ABNORMAL LOW (ref >60)
Glucose, Bld: 91 mg/dL (ref 65–99)
Potassium: 4.7 mmol/L (ref 3.5–5.1)
SODIUM: 139 mmol/L (ref 135–145)

## 2016-10-09 LAB — CBC
HEMATOCRIT: 30.5 % — AB (ref 36.0–46.0)
HEMOGLOBIN: 8.9 g/dL — AB (ref 12.0–15.0)
MCH: 27.9 pg (ref 26.0–34.0)
MCHC: 29.2 g/dL — AB (ref 30.0–36.0)
MCV: 95.6 fL (ref 78.0–100.0)
Platelets: 169 10*3/uL (ref 150–400)
RBC: 3.19 MIL/uL — ABNORMAL LOW (ref 3.87–5.11)
RDW: 16.2 % — AB (ref 11.5–15.5)
WBC: 4.9 10*3/uL (ref 4.0–10.5)

## 2016-10-09 LAB — PROTIME-INR
INR: 3.51
PROTHROMBIN TIME: 36 s — AB (ref 11.4–15.2)

## 2016-10-09 LAB — TROPONIN I

## 2016-10-09 MED ORDER — WARFARIN - PHARMACIST DOSING INPATIENT
Freq: Every day | Status: DC
  Administered 2016-10-11: 1
  Administered 2016-10-12: 18:00:00

## 2016-10-09 NOTE — Progress Notes (Signed)
Per Dr Reesa Chew, ok to continue holding betablockers for now. Per Dr Reesa Chew we can administer a 250cc bolus if pt's pressure continues to run low, or drops [id number] Systolic

## 2016-10-09 NOTE — Progress Notes (Signed)
Pt's blood pressure is running lower than normal this morning. Due to this her beta blockers are being delayed potentially held. Her pressure was higher last night, at which time she received all three cardiac medications, and her pressure became soft through the night. Cardizem has been administered. Will notify MD of situation.

## 2016-10-09 NOTE — Progress Notes (Signed)
Pt is sleepy d/t not getting rest and xanax Gave IV lasix given to help to improve breathing rate, weaning 02 to baseline.

## 2016-10-09 NOTE — Progress Notes (Signed)
Pt is restless with increased resp. Rate. sats 98% on 3 l, repositioned, adjusted the temp in room and gave xanax.

## 2016-10-09 NOTE — Progress Notes (Signed)
Page to Dr Reesa Chew  "[id number]: BP running low 103/54, cardizem given- beta blockers held for now. please advise further."  BP recheck 102/45

## 2016-10-09 NOTE — Progress Notes (Signed)
Asked pt about bath and oral care and the pt stated that she was too tired to do anything

## 2016-10-09 NOTE — Progress Notes (Signed)
Echocardiogram 2D Echocardiogram has been performed.  Claudia [name] 10/09/2016, 10:33 AM

## 2016-10-09 NOTE — Progress Notes (Signed)
Pt is alert and sleepy with stable vitals, no complaints of pain.Foley place at change of shift with 1100cc out since 2000.

## 2016-10-09 NOTE — Care Management Note (Signed)
Case Management Note  Patient Details  Name: Claudia [name] MRN: [id number] Date of Birth: 11-13-1932  Subjective/Objective:                    Action/Plan:  Plan: will likely require at least 2-3 days of IV diuresis  Patient was active with [facility] prior to admission with home health RN,PT,OT,aide and SW. Will need new orders for resumption of care.  Expected Discharge Date:                  Expected Discharge Plan:  [place]  In-House Referral:     Discharge planning Services     Post Acute Care Choice:    Choice offered to:     DME Arranged:    DME Agency:     HH Arranged:    [place] Agency:     Status of Service:  In process, will continue to follow  If discussed at Long Length of Stay Meetings, dates discussed:    Additional Comments:  Marilu Favre, RN 10/09/2016, 10:41 AM

## 2016-10-09 NOTE — Progress Notes (Signed)
Patient's daughter updated by best of RN's ability via notes by MD today.

## 2016-10-09 NOTE — Progress Notes (Signed)
Pt is asleep with no s/s of distress, easily aroused

## 2016-10-09 NOTE — Progress Notes (Signed)
[facility] for Warfarin Indication: atrial fibrillation   Allergies  Allergen Reactions  . Augmentin [Amoxicillin-Pot Clavulanate] Shortness Of Breath  . Doxycycline Shortness Of Breath  . Nitrofurantoin Shortness Of Breath  . Acyclovir And Related   . Amiodarone Hcl Nausea And Vomiting  . Clarithromycin Other (See Comments)    Crazy feeling, confusion  . Clonazepam Other (See Comments)    Doesn't remember reaction  . Codeine Nausea And Vomiting  . Diltiazem Hcl Nausea And Vomiting  . Fish Oil Nausea And Vomiting  . Flecainide Nausea Only  . Multaq [Dronedarone] Nausea Only  . Rosuvastatin Other (See Comments)    Leg cramps   . Zetia [Ezetimibe] Other (See Comments)    Leg cramps    Patient Measurements: Height: 5\' 2"  (157.5 cm) Weight: 132 lb 14.4 oz (60.3 kg) (bed) IBW/kg (Calculated) : 50.1   Vital Signs: Temp: 97.6 F (36.4 C) (02/26 0500) Temp Source: Oral (02/26 0500) BP: 115/68 (02/26 0500) Pulse Rate: 68 (02/26 0500)  Labs:  Recent Labs  10/08/16 1449 10/08/16 1815 10/09/16 0525  HGB 9.5*  --  8.9*  HCT 32.4*  --  30.5*  PLT 204  --  169  LABPROT  --  36.6* 36.0*  INR  --  3.57 3.51  CREATININE 1.02*  --  0.89    Estimated Creatinine Clearance: 42.4 mL/min (by C-G formula based on SCr of 0.89 mg/dL).   Medical History: Past Medical History:  Diagnosis Date  . Abdominal pain, other specified site    [place] - negative abdominal ultrasound  . Abnormal chest x-ray   . Chest pain 01/19/2009   myoview stress - LVEF >75% no reversible ischemia or infarction  . COPD (chronic obstructive pulmonary disease) ([place])   . Dyslipidemia   . Hypertension   . Paroxysmal atrial flutter ([place]) 01/19/2009   echo - EF 0000000; grade 1 diastolic dysfunction; mitral valve- calcified annlus, mildly dilated R ventricle and atrium  . Pneumonia    hx  . PONV (postoperative nausea and vomiting)   . Tremor, essential 09/07/2015     [name] admitted for SOB on PTA warfarin for Afib. Pharmacy consulted to dose warfarin.  INR on admission is elevated at 3.57 with last dose taken on 2/24. CBC stable.  PTA dose: 2 mg on MWF and 1 mg AOD's per last clinic note  Goal of Therapy:  INR 2-3 Monitor platelets by anticoagulation protocol: Yes   Plan:  Hold warfarin tonight Daily INR Monitor s/sx of bleeding  Andrey Cota. Diona Foley, PharmD, BCPS Clinical Pharmacist [phone] 10/09/2016, 10:00 AM

## 2016-10-09 NOTE — Consult Note (Addendum)
[place] Nurse wound consult note Reason for Consult: Consult requested for right arm laceration.  This was closed in the ER with medical glue, according to the EMR. Wound type: Full thickness laceration; 3X8cm Wound bed: Location is closed and well approximated without open wound or odor; scant amt blood-tinged drainage weeping from edge of area Periwound: Dark reddish-purple bruising to skin surrounding Dressing procedure/placement/frequency: Foam dressing to protect and promote healing; discussed plan of care with family member at the bedside. Please re-consult if further assistance is needed.  Thank-you,  Julien Girt MSN, [place], [place], [place], [place]

## 2016-10-09 NOTE — Progress Notes (Signed)
PROGRESS NOTE    Claudia [name]  [id number] DOB: 1933-01-13 DOA: 10/08/2016 PCP: Pcp Not In System   Brief Narrative:  83 y.o. female with medical history significant of COPD, paroxysmal A. fib, hypertension, hyperlipidemia, tachybradycardia syndrome status post pacemaker placement in the last 2 weeks, was discharged home about a week ago from a prolonged hospitalization, requiring PPM placement which was complicated by pneumothorax, COPD exacerbation, A. fib with RVR, steroid-induced delirium, and acute kidney injury. In the ER patient was noted to hypoxic with signs of vol overload.    Assessment & Plan:   Active Problems:   ATRIAL FLUTTER, PAROXYSMAL   Long term current use of anticoagulant therapy   HTN (hypertension)   Hyperlipidemia   Acute on chronic respiratory failure (HCC)   Tachy-brady syndrome (HCC)   Chronic atrial fibrillation (HCC)   Cardiac pacemaker in situ   CHF (congestive heart failure) (HCC)   CKD (chronic kidney disease), stage III    Acute on chronic diastolic CHF - class III -Patient with clinical evidence of significant fluid overload with pitting lower extremity edema, crackles and JVD.  Most recent 2D echo done on 09/13/2016 showed normal EF of 60-65%, and grade 2 diastolic dysfunction.  She is not on any diuretics. -Admit patient to telemetry, placed on IV Lasix twice daily, strict I's and O's, daily weights -repeat echo not much different than the one from 4 weeks ago.  -supplemental O2 as needed, wean as appropriate.   Acute on chronic hypoxic respiratory failure -Patient with increased work of breathing in the ED, oxygen support as needed, Lasix as above  Paroxysmal A. Flutter with tachybradycardia syndrome status post pacemaker placement -EKG shows underlying atrial flutter, with intermittent conduction as well as paced rhythm -Monitor on telemetry -Continue Coumadin per pharmacy -CHADS VASC score > 2 -Continue home medications including  diltiazem, metoprolol, sotalol (given IV diuresis I will decrease the metoprolol dose to avoid hypotension)   COPD -Patient has very minimal wheezing, she had no productive cough, no fever chills, without wheezing is related to COPD exacerbation, will avoid steroids for now given the fact that she has a history of steroid-induced delirium, provide IV Lasix as per #1 -Resume home nebulizers, place patient on duo nebs prn  Hyperkalemia - resolved  -cont to monitor.   Chronic kidney disease stage III -resolved  Right forearm laceration -Status post glue placement per EDP, consult wound in the morning.  No significant bleeding noted, continue Coumadin for now   DVT prophylaxis: Coumadin Code Status: DNR Family Communication: Discussed with husband at bedside Disposition Plan: Admit to telemetry Consults called: None Admission status: Inpatient, will likely require at least 2-3 days of IV diuresis, close telemetry monitoring   Subjective: Patient still reports of slightly sob with movement. Otherwise deneis any other complaints.   Objective: Vitals:   10/09/16 0543 10/09/16 0932 10/09/16 1055 10/09/16 1130  BP:   (!) 103/54 (!) 102/45  Pulse:   75 73  Resp:   18 18  Temp:    97.8 F (36.6 C)  TempSrc:    Oral  SpO2:  100% 92% 94%  Weight: 60.3 kg (132 lb 14.4 oz)     Height:        Intake/Output Summary (Last 24 hours) at 10/09/16 1313 Last data filed at 10/09/16 1130  Gross per 24 hour  Intake              483 ml  Output  3050 ml  Net            -2567 ml   Filed Weights   10/08/16 1416 10/09/16 0543  Weight: 59 kg (130 lb) 60.3 kg (132 lb 14.4 oz)    Examination:  General exam: Appears calm and comfortable  Respiratory system: bibasilar crackles.  Cardiovascular system: S1 & S2 heard, RRR. No JVD, murmurs, rubs, gallops or clicks. No pedal edema. Gastrointestinal system: Abdomen is nondistended, soft and nontender. No organomegaly or masses felt.  Normal bowel sounds heard. Central nervous system: Alert and oriented. No focal neurological deficits. Extremities: Symmetric 5 x 5 power. Skin: No rashes, lesions or ulcers Psychiatry: Judgement and insight appear normal. Mood & affect appropriate.     Data Reviewed:   CBC:  Recent Labs Lab 10/08/16 1449 10/09/16 0525  WBC 6.2 4.9  NEUTROABS 4.3  --   HGB 9.5* 8.9*  HCT 32.4* 30.5*  MCV 96.4 95.6  PLT 204 [id number]   Basic Metabolic Panel:  Recent Labs Lab 10/08/16 1449 10/09/16 0525  NA 139 139  K 6.0* 4.7  CL 102 98*  CO2 32 36*  GLUCOSE 119* 91  BUN 17 16  CREATININE 1.02* 0.89  CALCIUM 9.4 8.9   GFR: Estimated Creatinine Clearance: 42.4 mL/min (by C-G formula based on SCr of 0.89 mg/dL). Liver Function Tests: No results for input(s): AST, ALT, ALKPHOS, BILITOT, PROT, ALBUMIN in the last 168 hours. No results for input(s): LIPASE, AMYLASE in the last 168 hours. No results for input(s): AMMONIA in the last 168 hours. Coagulation Profile:  Recent Labs Lab 10/08/16 1815 10/09/16 0525  INR 3.57 3.51   Cardiac Enzymes:  Recent Labs Lab 10/09/16 0851  TROPONINI <0.03   BNP (last 3 results) No results for input(s): PROBNP in the last 8760 hours. HbA1C: No results for input(s): HGBA1C in the last 72 hours. CBG: No results for input(s): GLUCAP in the last 168 hours. Lipid Profile: No results for input(s): CHOL, HDL, LDLCALC, TRIG, CHOLHDL, LDLDIRECT in the last 72 hours. Thyroid Function Tests: No results for input(s): TSH, T4TOTAL, FREET4, T3FREE, THYROIDAB in the last 72 hours. Anemia Panel: No results for input(s): VITAMINB12, FOLATE, FERRITIN, TIBC, IRON, RETICCTPCT in the last 72 hours. Sepsis Labs: No results for input(s): PROCALCITON, LATICACIDVEN in the last 168 hours.  No results found for this or any previous visit (from the past 240 hour(s)).       Radiology Studies: Dg Chest 2 View  Result Date: 10/08/2016 CLINICAL DATA:  Pt reports  shortness of breath for several days; she reports h/o pacemaker insertion earlier this month, HTN, and COPD; former smoker EXAM: CHEST  2 VIEW COMPARISON:  09/23/2016 FINDINGS: Small bilateral pleural effusions, larger on the left. There is associated parenchymal lung base opacity that is likely atelectasis. Pneumonia is not excluded but felt less likely. There is linear opacity in both upper lobes which is new from prior study, consistent with atelectasis. Bilateral interstitial prominence is stable. There is no overt pulmonary edema. No pneumothorax. Cardiac silhouette is mildly enlarged. No mediastinal or hilar masses. Left anterior chest wall sequential pacemaker is stable and well positioned. Skeletal structures are demineralized but grossly intact. IMPRESSION: 1. There small bilateral pleural effusions, larger on the left. There is additional opacity at the lung bases and in the upper lobes that is likely atelectasis. The infection is possible. These findings are new. 2. Mild cardiomegaly.  No convincing pulmonary edema. Electronically Signed   By: Dedra Skeens.D.  On: 10/08/2016 15:36        Scheduled Meds: . arformoterol  15 mcg Nebulization BID  . budesonide  0.25 mg Nebulization BID  . diltiazem  300 mg Oral BID  . furosemide  40 mg Intravenous Q12H  . loratadine  10 mg Oral Daily  . metoprolol tartrate  25 mg Oral BID  . sodium chloride flush  3 mL Intravenous Q12H  . sotalol  80 mg Oral BID  . [START ON 10/10/2016] Warfarin - Pharmacist Dosing Inpatient   Does not apply q1800   Continuous Infusions:   LOS: 1 day    Time spent: 35 mins     Ankit Arsenio Loader, MD [facility] Pager [phone]   If 7PM-7AM, please contact night-coverage [email or web address] Password [email or web address] 10/09/2016, 1:13 PM

## 2016-10-10 ENCOUNTER — Inpatient Hospital Stay (HOSPITAL_COMMUNITY): Payer: Medicare Other

## 2016-10-10 DIAGNOSIS — I482 Chronic atrial fibrillation: Secondary | ICD-10-CM

## 2016-10-10 LAB — PROTIME-INR
INR: 2.89
Prothrombin Time: 30.8 seconds — ABNORMAL HIGH (ref 11.4–15.2)

## 2016-10-10 LAB — BASIC METABOLIC PANEL
ANION GAP: 10 (ref 5–15)
BUN: 13 mg/dL (ref 6–20)
CALCIUM: 9 mg/dL (ref 8.9–10.3)
CO2: 40 mmol/L — AB (ref 22–32)
Chloride: 88 mmol/L — ABNORMAL LOW (ref 101–111)
Creatinine, Ser: 0.83 mg/dL (ref 0.44–1.00)
GFR calc Af Amer: >60 mL/min (ref >60)
GFR calc non Af Amer: >60 mL/min (ref >60)
GLUCOSE: 97 mg/dL (ref 65–99)
POTASSIUM: 3.8 mmol/L (ref 3.5–5.1)
Sodium: 138 mmol/L (ref 135–145)

## 2016-10-10 MED ORDER — IOPAMIDOL (ISOVUE-370) INJECTION 76%
INTRAVENOUS | Status: AC
  Filled 2016-10-10: qty 100

## 2016-10-10 MED ORDER — IOPAMIDOL (ISOVUE-370) INJECTION 76%
INTRAVENOUS | Status: AC
  Administered 2016-10-10: 100 mL
  Filled 2016-10-10: qty 100

## 2016-10-10 MED ORDER — ENSURE ENLIVE PO LIQD
237.0000 mL | Freq: Three times a day (TID) | ORAL | Status: DC
  Administered 2016-10-10 – 2016-10-15 (×10): 237 mL via ORAL

## 2016-10-10 MED ORDER — WARFARIN 0.5 MG HALF TABLET
0.5000 mg | ORAL_TABLET | Freq: Once | ORAL | Status: AC
  Administered 2016-10-10: 0.5 mg via ORAL
  Filled 2016-10-10 (×2): qty 1

## 2016-10-10 NOTE — Evaluation (Signed)
Physical Therapy Evaluation Patient Details Name: Claudia [name] MRN: [id number] DOB: May 16, 1933 Today's Date: 10/10/2016   History of Present Illness  83 yo admitted for SOB, weakness, fall, CHF exacerbation. PMHx: Afib, COPD, CHF, HTN, pacemaker 09/18/16  Clinical Impression  Pt pleasant but fatigued and limited with activity tolerance. Pt and spouse state pt was doing ok for a couple of days at home and then could barely move for the last few preceding admission. Spouse and pt are concerned she may not be strong enough to return home with family and will need to be able to walk at minimum 81' with minguard assist to return home. Pt and spouse are agreeable to ST-SNF if unable to meet goals for home. Pt with decreased strength, function, gait and mobility who will benefit from acute therapy to maximize mobility, function, strength and walking to decrease burden of care.     Follow Up Recommendations Supervision/Assistance - 24 hour;SNF    Equipment Recommendations  3in1 (PT)    Recommendations for Other Services       Precautions / Restrictions Precautions Precautions: Fall Restrictions Weight Bearing Restrictions: No      Mobility  Bed Mobility Overal bed mobility: Needs Assistance Bed Mobility: Supine to Sit     Supine to sit: HOB elevated;Supervision     General bed mobility comments: increased time with reliance on rail  Transfers Overall transfer level: Needs assistance   Transfers: Sit to/from Stand Sit to Stand: Min assist         General transfer comment: cues for hand placement and safety with assist to rise from surface  Ambulation/Gait Ambulation/Gait assistance: Min assist Ambulation Distance (Feet): 8 Feet Assistive device: Rolling walker (2 wheeled) Gait Pattern/deviations: Step-through pattern;Decreased stride length;Trunk flexed   Gait velocity interpretation: Below normal speed for age/gender General Gait Details: short cautious steps with pt  agreeable only to walk 4' forward and back at chair due to fatigue with cues for position in RW  Stairs            Wheelchair Mobility    Modified Rankin (Stroke Patients Only)       Balance Overall balance assessment: Needs assistance   Sitting balance-Leahy Scale: Good       Standing balance-Leahy Scale: Poor                               Pertinent Vitals/Pain Pain Score: 3  Pain Location: back Pain Descriptors / Indicators: Sore Pain Intervention(s): Limited activity within patient's tolerance;Repositioned    Home Living Family/patient expects to be discharged to:: Private residence Living Arrangements: Spouse/significant other Available Help at Discharge: Family;Available 24 hours/day Type of Home: House Home Access: Stairs to enter   [employer] of Steps: 1 Home Layout: One level Home Equipment: Environmental consultant - 2 wheels      Prior Function Level of Independence: Needs assistance   Gait / Transfers Assistance Needed: assist with RW for walking since return home, independent before admission in january           Hand Dominance        Extremity/Trunk Assessment   Upper Extremity Assessment Upper Extremity Assessment: Generalized weakness    Lower Extremity Assessment Lower Extremity Assessment: Generalized weakness    Cervical / Trunk Assessment Cervical / Trunk Assessment: Kyphotic  Communication   Communication: No difficulties  Cognition Arousal/Alertness: Awake/alert Behavior During Therapy: Flat affect Overall Cognitive Status: Within Functional Limits for  tasks assessed                      General Comments      Exercises General Exercises - Lower Extremity Long Arc Quad: AROM;Strengthening;Both;10 reps;Seated Hip Flexion/Marching: AROM;Both;10 reps;Seated   Assessment/Plan    PT Assessment Patient needs continued PT services  PT Problem List Decreased strength;Decreased mobility;Decreased activity  tolerance;Decreased balance;Decreased knowledge of use of DME       PT Treatment Interventions DME instruction;Gait training;Functional mobility training;Therapeutic activities;Therapeutic exercise;Balance training;Patient/family education;Stair training    PT Goals (Current goals can be found in the Care Plan section)  Acute Rehab PT Goals Patient Stated Goal: to be able to return home PT Goal Formulation: With patient/family Time For Goal Achievement: 10/24/16 Potential to Achieve Goals: Good    Frequency Min 3X/week   Barriers to discharge        Co-evaluation               End of Session Equipment Utilized During Treatment: Oxygen;Gait belt Activity Tolerance: Patient tolerated treatment well;Patient limited by fatigue Patient left: in chair;with call bell/phone within reach;with chair alarm set;with family/visitor present   PT Visit Diagnosis: Muscle weakness (generalized) (M62.81);Difficulty in walking, not elsewhere classified (R26.2)         Time: 1350-1408 PT Time Calculation (min) (ACUTE ONLY): 18 min   Charges:   PT Evaluation $PT Eval Moderate Complexity: 1 Procedure     PT G Codes:         Ledonna Dormer B Tobiah Celestine November 09, 2016, 2:24 PM  Elwyn Reach, [place]

## 2016-10-10 NOTE — Progress Notes (Signed)
Initial Nutrition Assessment  INTERVENTION:  Ensure Enlive po TID, each supplement provides 350 kcal and 20 grams of protein   NUTRITION DIAGNOSIS:   Inadequate oral intake related to poor appetite as evidenced by per patient/family report.   GOAL:   Patient will meet greater than or equal to 90% of their needs   MONITOR:   PO intake, Supplement acceptance, Skin, Weight trends, Labs, I & O's  REASON FOR ASSESSMENT:   Malnutrition Screening Tool    ASSESSMENT:   83 y.o. female with medical history significant of COPD, paroxysmal A. fib, hypertension, hyperlipidemia, tachybradycardia syndrome status post pacemaker placement in the last 2 weeks, was discharged home about a week ago from a prolonged hospitalization, requiring PPM placement which was complicated by pneumothorax, COPD exacerbation, A. fib with RVR, steroid-induced delirium, and acute kidney injury. In the ER patient was noted to hypoxic with signs of vol overload.   Pt states that her thrush and mouth pain have resolved, but her appetite has continued to be poor. Pt ate some french toast for breakfast; pt states that she vomited afterward. Pt reports drinking Ensure at home, but husband states that she only drank 3 shakes in the past 2 weeks. Weight history shows pt has lost 5 lbs in the past 2 weeks-4% wt loss. Pt agreeable to drinking Ensure between meals.   Labs: low chloride, low hemoglobin  Diet Order:  Diet Heart Room service appropriate? Yes; Fluid consistency: Thin  Skin:  Reviewed, no issues  Last BM:  2/26  Height:   Ht Readings from Last 1 Encounters:  10/08/16 5\' 2"  (1.575 m)    Weight:   Wt Readings from Last 1 Encounters:  10/10/16 127 lb 8 oz (57.8 kg)    Ideal Body Weight:  50 kg  BMI:  Body mass index is 23.32 kg/m.  Estimated Nutritional Needs:   Kcal:  1400-1600  Protein:  75-85 grams  Fluid:  1.6 L/day  EDUCATION NEEDS:   No education needs identified at this  time  Scarlette Ar RD, LDN, CSP Inpatient Clinical Dietitian Pager: [phone] After Hours Pager: [phone]

## 2016-10-10 NOTE — Progress Notes (Signed)
PROGRESS NOTE    Claudia [name]  [id number] DOB: 04/05/1933 DOA: 10/08/2016 PCP: Pcp Not In System   Brief Narrative:  83 y.o. female with medical history significant of COPD, paroxysmal A. fib, hypertension, hyperlipidemia, tachybradycardia syndrome status post pacemaker placement in the last 2 weeks, was discharged home about a week ago from a prolonged hospitalization, requiring PPM placement which was complicated by pneumothorax, COPD exacerbation, A. fib with RVR, steroid-induced delirium, and acute kidney injury. In the ER patient was noted to hypoxic with signs of vol overload.    Assessment & Plan:   Active Problems:   ATRIAL FLUTTER, PAROXYSMAL   Long term current use of anticoagulant therapy   HTN (hypertension)   Hyperlipidemia   Acute on chronic respiratory failure (HCC)   Tachy-brady syndrome (HCC)   Chronic atrial fibrillation (HCC)   Cardiac pacemaker in situ   CHF (congestive heart failure) (HCC)   CKD (chronic kidney disease), stage III   Acute on chronic diastolic CHF - class III -Patient with clinical evidence of significant fluid overload with pitting lower extremity edema, crackles and JVD.  Most recent 2D echo done on 09/13/2016 showed normal EF of 60-65%, and grade 2 diastolic dysfunction.  She is not on any diuretics. -n IV Lasix twice daily, strict I's and O's, daily weights -repeat echo not much different than the one from 4 weeks ago.  -supplemental O2 as needed, wean as appropriate.  -I will order CTA chest to rule out PE and to evaluate for any other underlying cause of it.   Acute on chronic hypoxic respiratory failure -Patient with increased work of breathing in the ED, oxygen support as needed, Lasix as above  Paroxysmal A. Flutter with tachybradycardia syndrome status post pacemaker placement -EKG shows underlying atrial flutter, with intermittent conduction as well as paced rhythm -Monitor on telemetry -Continue Coumadin per  pharmacy -CHADS VASC score > 2 -Continue home medications including diltiazem, metoprolol, sotalol (given IV diuresis I will decrease the metoprolol dose to avoid hypotension)  COPD -Patient has very minimal wheezing, she had no productive cough, no fever chills, without wheezing is related to COPD exacerbation, will avoid steroids for now given the fact that she has a history of steroid-induced delirium, provide IV Lasix as per #1 -Resume home nebulizers, place patient on duo nebs prn  Hyperkalemia - resolved  -cont to monitor.   Chronic kidney disease stage III -resolved  Right forearm laceration -Status post glue placement per EDP, consult wound in the morning.  No significant bleeding noted, continue Coumadin for now  Will also get her PT to see her as she is feeling weak.   DVT prophylaxis: Coumadin Code Status: DNR Family Communication: Patient comprehends well.  Disposition Plan: Admit to telemetry Consults called: None Admission status: Inpatient, will likely require at least 2-3 days of IV diuresis, close telemetry monitoring   Subjective: Patient states she feels much weaker today and little sob at rest as well. No other complaints.   Objective: Vitals:   10/09/16 2240 10/10/16 0527 10/10/16 0617 10/10/16 0945  BP: 121/75 116/63  117/60  Pulse:  100  95  Resp:    18  Temp:  97.7 F (36.5 C)  98.7 F (37.1 C)  TempSrc:    Oral  SpO2:  95%  99%  Weight:   57.8 kg (127 lb 8 oz)   Height:        Intake/Output Summary (Last 24 hours) at 10/10/16 1134 Last data filed at 10/10/16  1045  Gross per 24 hour  Intake              540 ml  Output             1325 ml  Net             -785 ml   Filed Weights   10/08/16 1416 10/09/16 0543 10/10/16 0617  Weight: 59 kg (130 lb) 60.3 kg (132 lb 14.4 oz) 57.8 kg (127 lb 8 oz)    Examination:  General exam: Appears calm and comfortable  Respiratory system: bibasilar crackles.  Cardiovascular system: S1 & S2 heard,  RRR. No JVD, murmurs, rubs, gallops or clicks. No pedal edema. Gastrointestinal system: Abdomen is nondistended, soft and nontender. No organomegaly or masses felt. Normal bowel sounds heard. Central nervous system: Alert and oriented. No focal neurological deficits. Extremities: Symmetric 5 x 5 power. Skin: No rashes, lesions or ulcers Psychiatry: Judgement and insight appear normal. Mood & affect appropriate.     Data Reviewed:   CBC:  Recent Labs Lab 10/08/16 1449 10/09/16 0525  WBC 6.2 4.9  NEUTROABS 4.3  --   HGB 9.5* 8.9*  HCT 32.4* 30.5*  MCV 96.4 95.6  PLT 204 [id number]   Basic Metabolic Panel:  Recent Labs Lab 10/08/16 1449 10/09/16 0525 10/10/16 0557  NA 139 139 138  K 6.0* 4.7 3.8  CL 102 98* 88*  CO2 32 36* 40*  GLUCOSE 119* 91 97  BUN 17 16 13   CREATININE 1.02* 0.89 0.83  CALCIUM 9.4 8.9 9.0   GFR: Estimated Creatinine Clearance: 42 mL/min (by C-G formula based on SCr of 0.83 mg/dL). Liver Function Tests: No results for input(s): AST, ALT, ALKPHOS, BILITOT, PROT, ALBUMIN in the last 168 hours. No results for input(s): LIPASE, AMYLASE in the last 168 hours. No results for input(s): AMMONIA in the last 168 hours. Coagulation Profile:  Recent Labs Lab 10/08/16 1815 10/09/16 0525 10/10/16 0557  INR 3.57 3.51 2.89   Cardiac Enzymes:  Recent Labs Lab 10/09/16 0851  TROPONINI <0.03   BNP (last 3 results) No results for input(s): PROBNP in the last 8760 hours. HbA1C: No results for input(s): HGBA1C in the last 72 hours. CBG: No results for input(s): GLUCAP in the last 168 hours. Lipid Profile: No results for input(s): CHOL, HDL, LDLCALC, TRIG, CHOLHDL, LDLDIRECT in the last 72 hours. Thyroid Function Tests: No results for input(s): TSH, T4TOTAL, FREET4, T3FREE, THYROIDAB in the last 72 hours. Anemia Panel: No results for input(s): VITAMINB12, FOLATE, FERRITIN, TIBC, IRON, RETICCTPCT in the last 72 hours. Sepsis Labs: No results for input(s):  PROCALCITON, LATICACIDVEN in the last 168 hours.  No results found for this or any previous visit (from the past 240 hour(s)).       Radiology Studies: Dg Chest 2 View  Result Date: 10/08/2016 CLINICAL DATA:  Pt reports shortness of breath for several days; she reports h/o pacemaker insertion earlier this month, HTN, and COPD; former smoker EXAM: CHEST  2 VIEW COMPARISON:  09/23/2016 FINDINGS: Small bilateral pleural effusions, larger on the left. There is associated parenchymal lung base opacity that is likely atelectasis. Pneumonia is not excluded but felt less likely. There is linear opacity in both upper lobes which is new from prior study, consistent with atelectasis. Bilateral interstitial prominence is stable. There is no overt pulmonary edema. No pneumothorax. Cardiac silhouette is mildly enlarged. No mediastinal or hilar masses. Left anterior chest wall sequential pacemaker is stable and well positioned. Skeletal structures  are demineralized but grossly intact. IMPRESSION: 1. There small bilateral pleural effusions, larger on the left. There is additional opacity at the lung bases and in the upper lobes that is likely atelectasis. The infection is possible. These findings are new. 2. Mild cardiomegaly.  No convincing pulmonary edema. Electronically Signed   By: Lajean Manes M.D.   On: 10/08/2016 15:36        Scheduled Meds: . arformoterol  15 mcg Nebulization BID  . budesonide  0.25 mg Nebulization BID  . diltiazem  300 mg Oral BID  . furosemide  40 mg Intravenous Q12H  . loratadine  10 mg Oral Daily  . metoprolol tartrate  25 mg Oral BID  . sodium chloride flush  3 mL Intravenous Q12H  . sotalol  80 mg Oral BID  . warfarin  0.5 mg Oral ONCE-1800  . Warfarin - Pharmacist Dosing Inpatient   Does not apply q1800   Continuous Infusions:   LOS: 2 days    Time spent: 35 mins     Ka Bench Arsenio Loader, MD [facility] Pager [phone]   If 7PM-7AM, please contact  night-coverage [email or web address] Password [facility] 10/10/2016, 11:34 AM

## 2016-10-10 NOTE — Progress Notes (Signed)
Talked and explained  to the patient for the plan to move her to another floor since her present room is needed for an airborne precaution patient. Pt asked RN to please wait for her daughter to arrive on the floor who is coming early this morning.

## 2016-10-10 NOTE — Progress Notes (Signed)
Was called in by husband was told that patient is complaining of short of breath, check O2Sats 88% on 2Lnc. Placed on 3.5L/[place], O2 Sats 95%.

## 2016-10-10 NOTE — Progress Notes (Signed)
[place] for Warfarin Indication: atrial fibrillation   Allergies  Allergen Reactions  . Augmentin [Amoxicillin-Pot Clavulanate] Shortness Of Breath  . Doxycycline Shortness Of Breath  . Nitrofurantoin Shortness Of Breath  . Acyclovir And Related   . Amiodarone Hcl Nausea And Vomiting  . Clarithromycin Other (See Comments)    Crazy feeling, confusion  . Clonazepam Other (See Comments)    Doesn't remember reaction  . Codeine Nausea And Vomiting  . Diltiazem Hcl Nausea And Vomiting  . Fish Oil Nausea And Vomiting  . Flecainide Nausea Only  . Multaq [Dronedarone] Nausea Only  . Rosuvastatin Other (See Comments)    Leg cramps   . Zetia [Ezetimibe] Other (See Comments)    Leg cramps    Patient Measurements: Height: 5\' 2"  (157.5 cm) Weight: 127 lb 8 oz (57.8 kg) (bed) IBW/kg (Calculated) : 50.1   Vital Signs: Temp: 98.7 F (37.1 C) (02/27 0945) Temp Source: Oral (02/27 0945) BP: 117/60 (02/27 0945) Pulse Rate: 95 (02/27 0945)  Labs:  Recent Labs  10/08/16 1449 10/08/16 1815 10/09/16 0525 10/09/16 0851 10/10/16 0557  HGB 9.5*  --  8.9*  --   --   HCT 32.4*  --  30.5*  --   --   PLT 204  --  169  --   --   LABPROT  --  36.6* 36.0*  --  30.8*  INR  --  3.57 3.51  --  2.89  CREATININE 1.02*  --  0.89  --  0.83  TROPONINI  --   --   --  <0.03  --     Estimated Creatinine Clearance: 42 mL/min (by C-G formula based on SCr of 0.83 mg/dL).   Medical History: Past Medical History:  Diagnosis Date  . Abdominal pain, other specified site    [place] - negative abdominal ultrasound  . Abnormal chest x-ray   . Chest pain 01/19/2009   myoview stress - LVEF >75% no reversible ischemia or infarction  . COPD (chronic obstructive pulmonary disease) ([place])   . Dyslipidemia   . Hypertension   . Paroxysmal atrial flutter ([place]) 01/19/2009   echo - EF 0000000; grade 1 diastolic dysfunction; mitral valve- calcified annlus, mildly dilated R  ventricle and atrium  . Pneumonia    hx  . PONV (postoperative nausea and vomiting)   . Tremor, essential 09/07/2015    [name] admitted for SOB on PTA warfarin for Afib. Pharmacy consulted to dose warfarin.  INR on admission is elevated at 3.57 with last dose taken on 2/24. CBC stable.  PTA dose: 2 mg on MWF and 1 mg AOD's per last clinic note  Goal of Therapy:  INR 2-3 Monitor platelets by anticoagulation protocol: Yes   Plan:  Warfarin 0.5mg  tonight x1 Daily INR Monitor s/sx of bleeding  Andrey Cota. Diona Foley, PharmD, BCPS Clinical Pharmacist [phone] 10/10/2016, 11:08 AM

## 2016-10-10 NOTE — Progress Notes (Signed)
Patient still complains of short of breath O2Sats check 94% on 3.5L. CT Tech called but I informed them that patient is having short of breath at this time and cannot lay flat.. Will monitor accordingly.

## 2016-10-10 NOTE — Consult Note (Signed)
[facility] Inpatient Consult   10/10/2016  Claudia [name] June 07, 1933 [id number]  Patient screened for potential [place] [facility] services. Chart review reveals the patient is an 83 y.o.femalewith medical history significant of COPD, paroxysmal A. fib, hypertension, hyperlipidemia, tachybradycardia syndrome status post pacemaker placement in the last 2 weeks, was discharged home about a week ago from a prolonged hospitalization, requiring PPM placement which was complicated by pneumothorax, COPD exacerbation, A. fib with RVR, steroid-induced delirium, and acute kidney injury. In the ER patient was noted to hypoxic with signs of vol overload.  Met with the patient and her husband.  Patient was able to give her name and date of birth before going back to sleep or resting.  Husband states that the patient 'does not'  Have a primary care provider.  Notified inpatient RNCM that the does not have a primary care provider and is not eligible for [facility] services at this time.  Please place a [facility] Care Management consult or for questions contact:   Natividad Brood, RN BSN [facility] Liaison  [phone] business mobile phone Toll free office [phone]

## 2016-10-11 DIAGNOSIS — I4891 Unspecified atrial fibrillation: Secondary | ICD-10-CM

## 2016-10-11 DIAGNOSIS — I5033 Acute on chronic diastolic (congestive) heart failure: Secondary | ICD-10-CM

## 2016-10-11 DIAGNOSIS — J9620 Acute and chronic respiratory failure, unspecified whether with hypoxia or hypercapnia: Secondary | ICD-10-CM

## 2016-10-11 LAB — BASIC METABOLIC PANEL
Anion gap: 10 (ref 5–15)
BUN: 16 mg/dL (ref 6–20)
CALCIUM: 8.7 mg/dL — AB (ref 8.9–10.3)
CO2: 44 mmol/L — AB (ref 22–32)
CREATININE: 0.92 mg/dL (ref 0.44–1.00)
Chloride: 82 mmol/L — ABNORMAL LOW (ref 101–111)
GFR calc Af Amer: >60 mL/min (ref >60)
GFR calc non Af Amer: 57 mL/min — ABNORMAL LOW (ref >60)
GLUCOSE: 112 mg/dL — AB (ref 65–99)
Potassium: 3.5 mmol/L (ref 3.5–5.1)
Sodium: 136 mmol/L (ref 135–145)

## 2016-10-11 LAB — MAGNESIUM: Magnesium: 1.5 mg/dL — ABNORMAL LOW (ref 1.7–2.4)

## 2016-10-11 LAB — PROTIME-INR
INR: 2.22
Prothrombin Time: 25 seconds — ABNORMAL HIGH (ref 11.4–15.2)

## 2016-10-11 LAB — CBC
HEMATOCRIT: 32.8 % — AB (ref 36.0–46.0)
Hemoglobin: 9.9 g/dL — ABNORMAL LOW (ref 12.0–15.0)
MCH: 28.2 pg (ref 26.0–34.0)
MCHC: 30.2 g/dL (ref 30.0–36.0)
MCV: 93.4 fL (ref 78.0–100.0)
Platelets: 181 10*3/uL (ref 150–400)
RBC: 3.51 MIL/uL — ABNORMAL LOW (ref 3.87–5.11)
RDW: 16.3 % — AB (ref 11.5–15.5)
WBC: 5.4 10*3/uL (ref 4.0–10.5)

## 2016-10-11 MED ORDER — POTASSIUM CHLORIDE CRYS ER 20 MEQ PO TBCR
40.0000 meq | EXTENDED_RELEASE_TABLET | Freq: Once | ORAL | Status: AC
  Administered 2016-10-11: 40 meq via ORAL
  Filled 2016-10-11: qty 2

## 2016-10-11 MED ORDER — MAGNESIUM OXIDE 400 (241.3 MG) MG PO TABS
400.0000 mg | ORAL_TABLET | Freq: Two times a day (BID) | ORAL | Status: AC
  Administered 2016-10-11 (×2): 400 mg via ORAL
  Filled 2016-10-11 (×2): qty 1

## 2016-10-11 MED ORDER — WARFARIN SODIUM 2 MG PO TABS
2.0000 mg | ORAL_TABLET | Freq: Once | ORAL | Status: AC
  Administered 2016-10-11: 2 mg via ORAL
  Filled 2016-10-11: qty 1

## 2016-10-11 MED ORDER — FUROSEMIDE 40 MG PO TABS
40.0000 mg | ORAL_TABLET | Freq: Two times a day (BID) | ORAL | Status: DC
  Administered 2016-10-11: 40 mg via ORAL
  Filled 2016-10-11: qty 1

## 2016-10-11 NOTE — Clinical Social Work Note (Signed)
Clinical Social Work Assessment  Patient Details  Name: Claudia [name] MRN: [id number] Date of Birth: [birth year]  Date of referral:  10/11/16               Reason for consult:  Facility Placement, Discharge Planning                Permission sought to share information with:  Facility Sport and exercise psychologist, Family Supports Permission granted to share information::  Yes, Verbal Permission Granted  Name::     Claudia [name]  Agency::  SNF's  Relationship::  Husband  Contact Information:  [phone]  Housing/Transportation Living arrangements for the past 2 months:  Single Family Home Source of Information:  Patient, Medical Team, Adult Children, Spouse Patient Interpreter Needed:  None Criminal Activity/Legal Involvement Pertinent to Current Situation/Hospitalization:  No - Comment as needed Significant Relationships:  Adult Children, Spouse Lives with:  Spouse Do you feel safe going back to the place where you live?  Yes Need for family participation in patient care:  Yes (Comment)  Care giving concerns:  PT recommending SNF once medically stable for discharge.   Social Worker assessment / plan:  CSW met with patient. Husband and daughter at bedside. CSW introduced role and explained that PT recommendations would be discussed. Patient and her family agreeable to SNF. First preference is [facility], second preference is [facility]. No further concerns. CSW encouraged patient and her family to contact CSW as needed. CSW will continue to follow patient and her family for support and facilitate discharge to SNF once medically stable.  Employment status:  Retired Forensic scientist:  Medicare PT Recommendations:  [place] / Referral to community resources:  [place]  Patient/Family's Response to care:  Patient and her family agreeable to SNF placement. Patient's family supportive and involved in patient's care. Patient and her family  appreciated social work intervention.  Patient/Family's Understanding of and Emotional Response to Diagnosis, Current Treatment, and Prognosis:  Patient and her family appear to have a good understanding of the reason for admission. Patient and her family appear happy with hospital care.  Emotional Assessment Appearance:  Appears stated age Attitude/Demeanor/Rapport:  Lethargic Affect (typically observed):  Accepting, Appropriate, Calm, Pleasant Orientation:  Oriented to Self, Oriented to Place, Oriented to  Time, Oriented to Situation Alcohol / Substance use:  Never Used Psych involvement (Current and /or in the community):  No (Comment)  Discharge Needs  Concerns to be addressed:  Care Coordination Readmission within the last 30 days:  Yes Current discharge risk:  Dependent with Mobility Barriers to Discharge:  Continued Medical Work up   Candie Chroman, LCSW 10/11/2016, 12:07 PM

## 2016-10-11 NOTE — Consult Note (Addendum)
CARDIOLOGY CONSULT NOTE     Primary Care Physician: Pcp Not In System Referring Physician: Reesa Chew  Admit Date: 10/08/2016  Reason for consultation: atrial fibrillation  Claudia [name] is a 83 y.o. female with a h/o COPD, paroxysmal A. fib, hypertension, hyperlipidemia, tachybradycardia syndrome status post pacemaker placement. She presented to the hospital on 2/25 with progressively worsening lower extremity edema, weakness, and shortness of breath. He had a fall the day of admission and with a laceration to her right forearm. She says that she had a mechanical fall. In the emergency room, her BNP was elevated at 541, and her potassium was elevated at 6. She was given IV Lasix. She is since net -4.6 L. She does continue to complain of shortness of breath and fatigue. She says that her lower extremity edema has greatly improved. She was found to be in atrial fibrillation on the day of admission. Currently she is back in sinus rhythm.   Past Medical History:  Diagnosis Date  . Abdominal pain, other specified site    [place] - negative abdominal ultrasound  . Abnormal chest x-ray   . Chest pain 01/19/2009   myoview stress - LVEF >75% no reversible ischemia or infarction  . COPD (chronic obstructive pulmonary disease) ([place])   . Dyslipidemia   . Hypertension   . Paroxysmal atrial flutter ([place]) 01/19/2009   echo - EF 0000000; grade 1 diastolic dysfunction; mitral valve- calcified annlus, mildly dilated R ventricle and atrium  . Pneumonia    hx  . PONV (postoperative nausea and vomiting)   . Tremor, essential 09/07/2015   Past Surgical History:  Procedure Laterality Date  . BREAST LUMPECTOMY WITH NEEDLE LOCALIZATION Right 10/02/2012   Procedure: BREAST LUMPECTOMY WITH NEEDLE LOCALIZATION;  Surgeon: Harl Bowie, MD;  Location: [facility];  Service: General;  Laterality: Right;  . CATARACT EXTRACTION    . EYE SURGERY    . PACEMAKER IMPLANT N/A 09/18/2016   Procedure: Pacemaker Implant;  Surgeon: Orenthal Debski  Meredith Leeds, MD;  Location: [facility];  Service: Cardiovascular;  Laterality: N/A;  . TONSILLECTOMY    . TONSILLECTOMY    . TUBAL LIGATION      . arformoterol  15 mcg Nebulization BID  . budesonide  0.25 mg Nebulization BID  . diltiazem  300 mg Oral BID  . feeding supplement (ENSURE ENLIVE)  237 mL Oral TID BM  . furosemide  40 mg Intravenous Q12H  . loratadine  10 mg Oral Daily  . magnesium oxide  400 mg Oral BID  . metoprolol tartrate  25 mg Oral BID  . potassium chloride  40 mEq Oral Once  . sodium chloride flush  3 mL Intravenous Q12H  . sotalol  80 mg Oral BID  . warfarin  2 mg Oral ONCE-1800  . Warfarin - Pharmacist Dosing Inpatient   Does not apply q1800     Allergies  Allergen Reactions  . Augmentin [Amoxicillin-Pot Clavulanate] Shortness Of Breath  . Doxycycline Shortness Of Breath  . Nitrofurantoin Shortness Of Breath  . Acyclovir And Related   . Amiodarone Hcl Nausea And Vomiting  . Clarithromycin Other (See Comments)    Crazy feeling, confusion  . Clonazepam Other (See Comments)    Doesn't remember reaction  . Codeine Nausea And Vomiting  . Diltiazem Hcl Nausea And Vomiting  . Fish Oil Nausea And Vomiting  . Flecainide Nausea Only  . Multaq [Dronedarone] Nausea Only  . Rosuvastatin Other (See Comments)    Leg cramps   .  Zetia [Ezetimibe] Other (See Comments)    Leg cramps    Social History   Social History  . Marital status: Married    Spouse name: Shanon Brow  . Number of children: 3  . Years of education: 12   Occupational History  . retired    Social History Main Topics  . Smoking status: Former Smoker    Packs/day: 2.00    Years: 25.00    Types: Cigarettes    Quit date: 08/15/1987  . Smokeless tobacco: Never Used  . Alcohol use No  . Drug use: No  . Sexual activity: Not on file   Other Topics Concern  . Not on file   Social History Narrative   Patient lives with her husband Shanon Brow).   Patient has 3 children.   Patient is  right-handed.   Patient drinks 1 16 oz of Coke daily.   Patient is retired.   Patient has a high school education.    Family History  Problem Relation Age of Onset  . Bone cancer Mother   . Cancer Mother     bone  . Bone cancer Father   . Lung cancer Father   . Cancer Father     Lung  . Hyperlipidemia Father   . Hypertension Father   . Hypertension Sister   . Cancer Sister     breast  . Hyperlipidemia Sister   . Hyperlipidemia Sister   . Alzheimer's disease Sister   . Hypertension Sister   . Hyperlipidemia Brother   . Hypertension Brother   . Breast cancer Sister     ROS- All systems are reviewed and negative except as per the HPI above  Physical Exam: Telemetry: atrial fibrillation since converted to A pacing Vitals:   10/10/16 2023 10/10/16 2138 10/11/16 0458 10/11/16 1300  BP: 123/61  (!) 110/50 (!) 91/59  Pulse: (!) 56 81  92  Resp: 18 18 16 18   Temp: 98.6 F (37 C)  97.9 F (36.6 C) 98.7 F (37.1 C)  TempSrc: Oral  Oral Oral  SpO2: 100% 100% 98% 95%  Weight:   125 lb 7.1 oz (56.9 kg)   Height:        GEN- The patient is well appearing, alert and oriented x 3 today.   Head- normocephalic, atraumatic Eyes-  Sclera clear, conjunctiva pink Ears- hearing intact Oropharynx- clear Neck- supple, no JVP Lymph- no cervical lymphadenopathy Lungs- Clear to ausculation bilaterally, normal work of breathing Heart- Regular rate and rhythm, no murmurs, rubs or gallops, PMI not laterally displaced GI- soft, NT, ND, + BS Extremities- no clubbing, cyanosis, or edema MS- no significant deformity or atrophy Skin- no rash or lesion Psych- euthymic mood, full affect Neuro- strength and sensation are intact  EKG: atrial fibrillation, intermittent V pacing  Labs:   Lab Results  Component Value Date   WBC 5.4 10/11/2016   HGB 9.9 (L) 10/11/2016   HCT 32.8 (L) 10/11/2016   MCV 93.4 10/11/2016   PLT 181 10/11/2016    Recent Labs Lab 10/11/16 0356  NA 136  K  3.5  CL 82*  CO2 44*  BUN 16  CREATININE 0.92  CALCIUM 8.7*  GLUCOSE 112*   Lab Results  Component Value Date   CKTOTAL 82 09/18/2010   CKMB 2.2 09/18/2010   TROPONINI <0.03 10/09/2016    Lab Results  Component Value Date   CHOL 224 (H) 07/31/2016   CHOL 202 (H) 01/22/2014   Lab Results  Component Value Date  HDL 56 07/31/2016   HDL 54 01/22/2014   Lab Results  Component Value Date   LDLCALC 134 (H) 07/31/2016   LDLCALC 128 (H) 01/22/2014   Lab Results  Component Value Date   TRIG 168 (H) 07/31/2016   TRIG 100 01/22/2014   Lab Results  Component Value Date   CHOLHDL 4.0 07/31/2016   CHOLHDL 3.7 01/22/2014   No results found for: LDLDIRECT    Radiology: 1. No large central pulmonary embolus. Aortic atherosclerosis with ectatic appearing ascending aorta measuring up to 3.4 cm. 2. Bilateral centrilobular emphysema with moderate bilateral pleural effusions and adjacent compressive atelectasis. Areas of linear scarring are noted in the setting centrilobular emphysema, upper lobe predominant. Nodular opacities are also noted in both upper lobes measuring up to 14 mm on the right and 10 mm on the left. Follow-up to complete resolution is recommended to assure stability or clearance and to exclude pulmonary neoplasm as the cause of these nodular densities. 3. 10 mm cystic nodule of the left thyroid gland. No worrisome features noted.  Echo: - Left ventricle: The cavity size was normal. Systolic function was   normal. The estimated ejection fraction was in the range of 60%   to 65%. Wall motion was normal; there were no regional wall   motion abnormalities. Left ventricular diastolic function   parameters were normal. - Aortic valve: There was mild regurgitation. - Mitral valve: Calcified annulus. - Atrial septum: No defect or patent foramen ovale was identified. - Pulmonary arteries: PA peak pressure: 48 mm Hg (S). - Pericardium, extracardiac: A trivial  pericardial effusion was   identified.  ASSESSMENT AND PLAN:   1. Persistent atrial fibrillation: Fortunately she has reverted to sinus rhythm. She has had multiple episodes of both sinus rhythm in atrial fibrillation during the day today. It is currently unclear whether or not her atrial fibrillation is what caused her acute exacerbation of diastolic heart failure. That being said, Garrett Bowring continue her sotalol at the current dose. Her blood pressure has been low since admission, would continue to hold her metoprolol. Interrogation of her pacemaker does show that she has had atrial fibrillation for 64% of the time since implant. Should she continue to have worsening episodes of atrial fibrillation, she may benefit from an AV nodal ablation for rate control. Now that she is in sinus rhythm, her respiratory status may improve.  2. Diastolic heart failure, acute on chronic: Appears to be improving with improving volume status at -4.6 L. We'll switch her to by mouth Lasix today.  3. Acute on chronic respiratory failure: Status post diuresis with improving respiratory status. Continue diuresis.   Jeanni Allshouse Meredith Leeds, MD 10/11/2016  3:26 PM

## 2016-10-11 NOTE — Progress Notes (Signed)
PROGRESS NOTE    Claudia [name]  [id number] DOB: 07-Jul-1933 DOA: 10/08/2016 PCP: Pcp Not In System   Brief Narrative:  83 y.o. female with medical history significant of COPD, paroxysmal A. fib, hypertension, hyperlipidemia, tachybradycardia syndrome status post pacemaker placement in the last 2 weeks, was discharged home about a week ago from a prolonged hospitalization, requiring PPM placement which was complicated by pneumothorax, COPD exacerbation, A. fib with RVR, steroid-induced delirium, and acute kidney injury. In the ER patient was noted to hypoxic with signs of vol overload.    Assessment & Plan:   Active Problems:   ATRIAL FLUTTER, PAROXYSMAL   Long term current use of anticoagulant therapy   HTN (hypertension)   Hyperlipidemia   Acute on chronic respiratory failure (HCC)   Tachy-brady syndrome (HCC)   Chronic atrial fibrillation (HCC)   Cardiac pacemaker in situ   CHF (congestive heart failure) (HCC)   CKD (chronic kidney disease), stage III   Acute on chronic diastolic CHF - class III -Patient with clinical evidence of significant fluid overload with pitting lower extremity edema, crackles and JVD.  Most recent 2D echo done on 09/13/2016 showed normal EF of 60-65%, and grade 2 diastolic dysfunction.  She is not on any diuretics. -n IV Lasix twice daily, strict I's and O's, daily weights -repeat echo not much different than the one from 4 weeks ago.  -supplemental O2 as needed, wean as appropriate.  -CTA chest is neg for PE but showed b/l centrilobular emphysema and mod bilateral pleural effusions and adjacent atelectasis.  -cardiology consulted.   Acute on chronic hypoxic respiratory failure -Patient with increased work of breathing in the ED, oxygen support as needed, Lasix as above  Paroxysmal A. Flutter with tachybradycardia syndrome status post pacemaker placement- now back in A fib  -EKG shows underlying atrial flutter, with intermittent conduction as well  as paced rhythm -Monitor on telemetry -Continue Coumadin per pharmacy -CHADS VASC score > 2 -Continue home medications including diltiazem, metoprolol, sotalol (given IV diuresis I will decrease the metoprolol dose to avoid hypotension) -Cardiology consulted today in case if she needs any medication adjustments.   COPD -Patient has very minimal wheezing, she had no productive cough, no fever chills, without wheezing is related to COPD exacerbation, will avoid steroids for now given the fact that she has a history of steroid-induced delirium, provide IV Lasix as per #1 -Resume home nebulizers, place patient on duo nebs prn  Hyperkalemia - resolved  -cont to monitor.   Chronic kidney disease stage III -resolved  Right forearm laceration -Status post glue placement per EDP, consult wound in the morning.  No significant bleeding noted, continue Coumadin for now    DVT prophylaxis: Coumadin Code Status: DNR Family Communication: Patient comprehends well.  Disposition Plan: Admit to telemetry Consults called: None Admission status: Inpatient, will likely require at least 2-3 days of IV diuresis, close telemetry monitoring   Subjective: Patient is sleepy this morning as she didn't get much sleep last night. Still reports of sob at rest, no other complaints at this time.   Objective: Vitals:   10/10/16 2023 10/10/16 2138 10/11/16 0458 10/11/16 1300  BP: 123/61  (!) 110/50 (!) 91/59  Pulse: (!) 56 81  92  Resp: 18 18 16 18   Temp: 98.6 F (37 C)  97.9 F (36.6 C) 98.7 F (37.1 C)  TempSrc: Oral  Oral Oral  SpO2: 100% 100% 98% 95%  Weight:   56.9 kg (125 lb 7.1 oz)  Height:        Intake/Output Summary (Last 24 hours) at 10/11/16 1316 Last data filed at 10/11/16 1059  Gross per 24 hour  Intake              180 ml  Output             1500 ml  Net            -1320 ml   Filed Weights   10/09/16 0543 10/10/16 0617 10/11/16 0458  Weight: 60.3 kg (132 lb 14.4 oz) 57.8 kg  (127 lb 8 oz) 56.9 kg (125 lb 7.1 oz)    Examination:  General exam: Appears calm and comfortable. drwosy Respiratory system: diminished BS at the bases.  Cardiovascular system: S1 & S2 heard, RRR. No JVD, murmurs, rubs, gallops or clicks. No pedal edema. Gastrointestinal system: Abdomen is nondistended, soft and nontender. No organomegaly or masses felt. Normal bowel sounds heard. Central nervous system: Alert and oriented. No focal neurological deficits. Extremities: Symmetric 5 x 5 power. Skin: No rashes, lesions or ulcers Psychiatry: Judgement and insight appear normal. Mood & affect appropriate.     Data Reviewed:   CBC:  Recent Labs Lab 10/08/16 1449 10/09/16 0525 10/11/16 0356  WBC 6.2 4.9 5.4  NEUTROABS 4.3  --   --   HGB 9.5* 8.9* 9.9*  HCT 32.4* 30.5* 32.8*  MCV 96.4 95.6 93.4  PLT 204 169 0000000   Basic Metabolic Panel:  Recent Labs Lab 10/08/16 1449 10/09/16 0525 10/10/16 0557 10/11/16 0356  NA 139 139 138 136  K 6.0* 4.7 3.8 3.5  CL 102 98* 88* 82*  CO2 32 36* 40* 44*  GLUCOSE 119* 91 97 112*  BUN 17 16 13 16   CREATININE 1.02* 0.89 0.83 0.92  CALCIUM 9.4 8.9 9.0 8.7*  MG  --   --   --  1.5*   GFR: Estimated Creatinine Clearance: 37.9 mL/min (by C-G formula based on SCr of 0.92 mg/dL). Liver Function Tests: No results for input(s): AST, ALT, ALKPHOS, BILITOT, PROT, ALBUMIN in the last 168 hours. No results for input(s): LIPASE, AMYLASE in the last 168 hours. No results for input(s): AMMONIA in the last 168 hours. Coagulation Profile:  Recent Labs Lab 10/08/16 1815 10/09/16 0525 10/10/16 0557 10/11/16 0356  INR 3.57 3.51 2.89 2.22   Cardiac Enzymes:  Recent Labs Lab 10/09/16 0851  TROPONINI <0.03   BNP (last 3 results) No results for input(s): PROBNP in the last 8760 hours. HbA1C: No results for input(s): HGBA1C in the last 72 hours. CBG: No results for input(s): GLUCAP in the last 168 hours. Lipid Profile: No results for  input(s): CHOL, HDL, LDLCALC, TRIG, CHOLHDL, LDLDIRECT in the last 72 hours. Thyroid Function Tests: No results for input(s): TSH, T4TOTAL, FREET4, T3FREE, THYROIDAB in the last 72 hours. Anemia Panel: No results for input(s): VITAMINB12, FOLATE, FERRITIN, TIBC, IRON, RETICCTPCT in the last 72 hours. Sepsis Labs: No results for input(s): PROCALCITON, LATICACIDVEN in the last 168 hours.  No results found for this or any previous visit (from the past 240 hour(s)).       Radiology Studies: Ct Angio Chest Pe W Or Wo Contrast  Result Date: 10/10/2016 CLINICAL DATA:  Dyspnea, history of hypertension, pneumonia and COPD. EXAM: CT ANGIOGRAPHY CHEST WITH CONTRAST TECHNIQUE: Multidetector CT imaging of the chest was performed using the standard protocol during bolus administration of intravenous contrast. Multiplanar CT image reconstructions and MIPs were obtained to evaluate the vascular anatomy. CONTRAST:  80 cc Isovue 370 IV COMPARISON:  None. FINDINGS: Cardiovascular: No acute central pulmonary embolus. Aortic atherosclerosis with ectasia of the ascending aorta up to 3.4 cm in diameter. No aortic dissection. Cardiac chambers are normal in size. No pericardial effusion or thickening. Right atrial and right ventricular pacing leads are noted with left-sided pacemaker apparatus overlying the anterior chest wall. Mediastinum/Nodes: Mild thyromegaly with hypodense cystic 1 cm nodule in the left lower pole. Patent trachea and mainstem bronchus with minimal secretions noted in the upper trachea. No supraclavicular, axillary nor mediastinal lymphadenopathy. No hilar lymphadenopathy. Esophagus is unremarkable for CT appearance. Lungs/Pleura: Bilateral centrilobular emphysema upper lobe predominant with moderate bilateral pleural effusions and compressive atelectasis. Chronic bilateral areas of parenchymal scarring upper lobe predominant. Patchy ill-defined areas of atelectasis and/or scarring are noted  bilaterally. Patchy airspace opacities are seen in the upper lobes some which are nodular in appearance. Follow-up to resolution is recommended to exclude pulmonary neoplasm, the largest on the right approximately 14 mm and on the left 10 mm. Bronchiectasis noted to both lower lobes some of which contain inspissated mucus. Upper Abdomen: No acute abnormality. Musculoskeletal: Degenerative change along the dorsal spine. No chest wall abnormality. Review of the MIP images confirms the above findings. IMPRESSION: 1. No large central pulmonary embolus. Aortic atherosclerosis with ectatic appearing ascending aorta measuring up to 3.4 cm. 2. Bilateral centrilobular emphysema with moderate bilateral pleural effusions and adjacent compressive atelectasis. Areas of linear scarring are noted in the setting centrilobular emphysema, upper lobe predominant. Nodular opacities are also noted in both upper lobes measuring up to 14 mm on the right and 10 mm on the left. Follow-up to complete resolution is recommended to assure stability or clearance and to exclude pulmonary neoplasm as the cause of these nodular densities. 3. 10 mm cystic nodule of the left thyroid gland. No worrisome features noted. Electronically Signed   By: Ashley Royalty M.D.   On: 10/10/2016 23:32        Scheduled Meds: . arformoterol  15 mcg Nebulization BID  . budesonide  0.25 mg Nebulization BID  . diltiazem  300 mg Oral BID  . feeding supplement (ENSURE ENLIVE)  237 mL Oral TID BM  . furosemide  40 mg Intravenous Q12H  . loratadine  10 mg Oral Daily  . metoprolol tartrate  25 mg Oral BID  . sodium chloride flush  3 mL Intravenous Q12H  . sotalol  80 mg Oral BID  . warfarin  2 mg Oral ONCE-1800  . Warfarin - Pharmacist Dosing Inpatient   Does not apply q1800   Continuous Infusions:   LOS: 3 days    Time spent: 35 mins     Fareeda Downard Arsenio Loader, MD [facility] Pager [phone]   If 7PM-7AM, please contact  night-coverage [email or web address] Password [email or web address] 10/11/2016, 1:16 PM

## 2016-10-11 NOTE — Progress Notes (Signed)
[place] for Warfarin Indication: atrial fibrillation   Allergies  Allergen Reactions  . Augmentin [Amoxicillin-Pot Clavulanate] Shortness Of Breath  . Doxycycline Shortness Of Breath  . Nitrofurantoin Shortness Of Breath  . Acyclovir And Related   . Amiodarone Hcl Nausea And Vomiting  . Clarithromycin Other (See Comments)    Crazy feeling, confusion  . Clonazepam Other (See Comments)    Doesn't remember reaction  . Codeine Nausea And Vomiting  . Diltiazem Hcl Nausea And Vomiting  . Fish Oil Nausea And Vomiting  . Flecainide Nausea Only  . Multaq [Dronedarone] Nausea Only  . Rosuvastatin Other (See Comments)    Leg cramps   . Zetia [Ezetimibe] Other (See Comments)    Leg cramps    Patient Measurements: Height: 5\' 2"  (157.5 cm) Weight: 125 lb 7.1 oz (56.9 kg) IBW/kg (Calculated) : 50.1   Vital Signs: Temp: 97.9 F (36.6 C) (02/28 0458) Temp Source: Oral (02/28 0458) BP: 110/50 (02/28 0458)  Labs:  Recent Labs  10/08/16 1449  10/09/16 0525 10/09/16 0851 10/10/16 0557 10/11/16 0356  HGB 9.5*  --  8.9*  --   --  9.9*  HCT 32.4*  --  30.5*  --   --  32.8*  PLT 204  --  169  --   --  181  LABPROT  --   < > 36.0*  --  30.8* 25.0*  INR  --   < > 3.51  --  2.89 2.22  CREATININE 1.02*  --  0.89  --  0.83 0.92  TROPONINI  --   --   --  <0.03  --   --   < > = values in this interval not displayed.  Estimated Creatinine Clearance: 37.9 mL/min (by C-G formula based on SCr of 0.92 mg/dL).  [name] admitted for SOB on PTA warfarin for Afib. Pharmacy consulted to dose warfarin.  INR on admission is elevated at 3.57 with last dose taken on 2/24.   PTA dose: 2 mg on MWF and 1 mg AOD's per last clinic note  INR today continues to trend down to 2.22. Hgb improved overnight, no bleeding issues noted.   Goal of Therapy:  INR 2-3 Monitor platelets by anticoagulation protocol: Yes   Plan:  Warfarin 2mg  tonight   Daily INR Monitor s/sx of bleeding  Erin Hearing PharmD., BCPS Clinical Pharmacist Pager [phone] 10/11/2016 10:55 AM

## 2016-10-11 NOTE — NC FL2 (Signed)
[place] LEVEL OF CARE SCREENING TOOL     IDENTIFICATION  Patient Name: Claudia [name] Birthdate: 1933/07/08 Sex: female Admission Date (Current Location): 10/08/2016  [facility] and [place] Number:  Herbalist and Address:  [facility], [place] [address] [place], [place] [address]      Provider Number: [id number]  Attending Physician Name and Address:  Ankit Arsenio Loader, MD  Relative Name and Phone Number:       Current Level of Care: Hospital Recommended Level of Care: [place] Prior Approval Number:    Date Approved/Denied:   PASRR Number: [id number]  Discharge Plan: SNF    Current Diagnoses: Patient Active Problem List   Diagnosis Date Noted  . CHF (congestive heart failure) ([place]) 10/08/2016  . CKD (chronic kidney disease), stage III 10/08/2016  . Oral herpes simplex infection   . Oral thrush 09/23/2016  . Leukocytosis 09/21/2016  . Cardiac pacemaker in situ   . S/P chest tube placement ([place])   . Pneumothorax, traumatic   . Acute delirium   . Acute respiratory failure with hypoxemia ([place])   . Chronic atrial fibrillation ([place])   . Hypoxia   . Acute respiratory distress   . Tachy-brady syndrome ([place])   . COPD exacerbation ([place]) 09/09/2016  . Chronic diastolic CHF (congestive heart failure) ([place]) 09/09/2016  . COPD with acute exacerbation ([place]) 09/09/2016  . Tremor, essential 09/07/2015  . Acute on chronic respiratory failure ([place]) 07/06/2015  . Essential and other specified forms of tremor 06/17/2013  . Disturbance of skin sensation 06/17/2013  . Paroxysmal atrial fibrillation ([place]) 02/01/2013  . HTN (hypertension) 02/01/2013  . Hyperlipidemia 02/01/2013  . Long term current use of anticoagulant therapy 10/30/2012  . Ductal hyperplasia, atypical, breast 09/24/2012  . Dysfunction of eustachian tube 10/16/2011  . SOB (shortness of breath) 05/30/2010  . RHINITIS 05/23/2010  . ATRIAL FLUTTER,  PAROXYSMAL 05/24/2007  . COPD with asthma ([place]) 05/24/2007  . Nonspecific (abnormal) findings on radiological and other examination of body structure 05/24/2007    Orientation RESPIRATION BLADDER Height & Weight     Self, Time, Situation, Place  O2 (Nasal Canula 2 L) Incontinent, Indwelling catheter Weight: 125 lb 7.1 oz (56.9 kg) Height:  5\' 2"  (157.5 cm)  BEHAVIORAL SYMPTOMS/MOOD NEUROLOGICAL BOWEL NUTRITION STATUS   (None)  (None) Continent Diet (Heart healthy. Fluid restriction 1500 mL.)  AMBULATORY STATUS COMMUNICATION OF NEEDS Skin   Limited Assist Verbally Skin abrasions, Other (Comment), Bruising, Surgical wounds (MASD, Skin tear)                       Personal Care Assistance Level of Assistance  Bathing, Feeding, Dressing Bathing Assistance: Limited assistance Feeding assistance: Independent Dressing Assistance: Limited assistance     Functional Limitations Info  Sight, Hearing, Speech Sight Info: Adequate Hearing Info: Adequate Speech Info: Adequate    SPECIAL CARE FACTORS FREQUENCY  PT (By licensed PT), Blood pressure, OT (By licensed OT)     PT Frequency: 5 x week OT Frequency: 3 x week            Contractures Contractures Info: Not present    Additional Factors Info  Code Status, Allergies Code Status Info: DNR Allergies Info: Augmentin (Amoxicillin-pot Clavulanate), Doxycycline, Nitrofurantoin, Acyclovir And Related, Amiodarone Hcl, Clarithromycin, Clonazepam, Codeine, Diltiazem Hcl, Fish Oil, Flecainide, Multaq Dronedarone, Rosuvastatin, Zetia Ezetimibe           Current Medications (10/11/2016):  This is  the current hospital active medication list Current Facility-Administered Medications  Medication Dose Route Frequency Provider Last Rate Last Dose  . 0.9 %  sodium chloride infusion  250 mL Intravenous PRN Caren Griffins, MD      . acetaminophen (TYLENOL) tablet 650 mg  650 mg Oral Q4H PRN Caren Griffins, MD   650 mg at 10/09/16 0311   . ALPRAZolam Duanne Moron) tablet 0.25 mg  0.25 mg Oral TID PRN Caren Griffins, MD   0.25 mg at 10/11/16 0137  . arformoterol (BROVANA) nebulizer solution 15 mcg  15 mcg Nebulization BID Caren Griffins, MD   15 mcg at 10/11/16 0948  . benzocaine (ORAJEL) 10 % mucosal gel   Mouth/Throat QID PRN Costin Karlyne Greenspan, MD      . budesonide (PULMICORT) nebulizer solution 0.25 mg  0.25 mg Nebulization BID Caren Griffins, MD   0.25 mg at 10/11/16 0948  . diltiazem (CARDIZEM CD) 24 hr capsule 300 mg  300 mg Oral BID Caren Griffins, MD   300 mg at 10/11/16 1027  . feeding supplement (ENSURE ENLIVE) (ENSURE ENLIVE) liquid 237 mL  237 mL Oral TID BM Ankit Chirag Amin, MD   237 mL at 10/11/16 1028  . furosemide (LASIX) injection 40 mg  40 mg Intravenous Q12H Caren Griffins, MD   40 mg at 10/11/16 0537  . ipratropium-albuterol (DUONEB) 0.5-2.5 (3) MG/3ML nebulizer solution 3 mL  3 mL Nebulization Q5 Min x 2 PRN Clifton James, MD      . ipratropium-albuterol (DUONEB) 0.5-2.5 (3) MG/3ML nebulizer solution 3 mL  3 mL Nebulization Q6H PRN Costin Karlyne Greenspan, MD      . loratadine (CLARITIN) tablet 10 mg  10 mg Oral Daily Costin Karlyne Greenspan, MD   10 mg at 10/11/16 1027  . metoprolol tartrate (LOPRESSOR) tablet 25 mg  25 mg Oral BID Caren Griffins, MD   25 mg at 10/11/16 1027  . ondansetron (ZOFRAN) injection 4 mg  4 mg Intravenous Q6H PRN Costin Karlyne Greenspan, MD      . sodium chloride flush (NS) 0.9 % injection 3 mL  3 mL Intravenous Q12H Costin Karlyne Greenspan, MD   3 mL at 10/11/16 1028  . sodium chloride flush (NS) 0.9 % injection 3 mL  3 mL Intravenous PRN Costin Karlyne Greenspan, MD      . sotalol (BETAPACE) tablet 80 mg  80 mg Oral BID Caren Griffins, MD   80 mg at 10/11/16 1027  . warfarin (COUMADIN) tablet 2 mg  2 mg Oral ONCE-1800 Lyndee Leo, [facility]      . Warfarin - Pharmacist Dosing Inpatient   Does not apply [place], [facility]         Discharge Medications: Please see discharge summary for a list of  discharge medications.  Relevant Imaging Results:  Relevant Lab Results:   Additional Information SS#: [id number]  Candie Chroman, LCSW

## 2016-10-11 NOTE — Progress Notes (Addendum)
Dr. Reesa Chew requests that I remove sutures that supposedly were still in place from pacemaker insertion done on 09/18/16.  I asked Jakeema, AD, to assess wound with me, and found no sutures, but only steri-strips.  Skin around incision had slight bruising, but no drainage, no redness, and healing well.  Placed dry 2x2 gauze and TegaDerm on incision.  MD notified.  After speaking with Dr. Reesa Chew, steri-strips were mistakenly removed from pacemaker incision; therefore, new steri-strips were placed on incision with Tegaderm.  However, sutures were removed form left lateral breast/axillary.  Betadine applied, sutures removed, alcohol applied, dry gauze 2x2 and Tegaderm applied.  Nol drainage, no complications.

## 2016-10-11 NOTE — Clinical Social Work Placement (Signed)
   CLINICAL SOCIAL WORK PLACEMENT  NOTE  Date:  10/11/2016  Patient Details  Name: Claudia [name] MRN: [id number] Date of Birth: February 01, 1933  Clinical Social Work is seeking post-discharge placement for this patient at the [place] level of care (*CSW will initial, date and re-position this form in  chart as items are completed):  Yes   Patient/family provided with [place] Work Department's list of facilities offering this level of care within the geographic area requested by the patient (or if unable, by the patient's family).  Yes   Patient/family informed of their freedom to choose among providers that offer the needed level of care, that participate in Medicare, Medicaid or managed care program needed by the patient, have an available bed and are willing to accept the patient.  Yes   Patient/family informed of [facility]'s ownership interest in [facility] and [facility], as well as of the fact that they are under no obligation to receive care at these facilities.  PASRR submitted to EDS on 10/11/16     PASRR number received on 10/11/16     Existing PASRR number confirmed on       FL2 transmitted to all facilities in geographic area requested by pt/family on 10/11/16     FL2 transmitted to all facilities within larger geographic area on       Patient informed that his/her managed care company has contracts with or will negotiate with certain facilities, including the following:            Patient/family informed of bed offers received.  Patient chooses bed at       Physician recommends and patient chooses bed at      Patient to be transferred to   on  .  Patient to be transferred to facility by       Patient family notified on   of transfer.  Name of family member notified:        PHYSICIAN Please sign FL2, Please sign DNR     Additional Comment:    _______________________________________________ Candie Chroman,  LCSW 10/11/2016, 12:09 PM

## 2016-10-12 ENCOUNTER — Ambulatory Visit: Payer: Medicare Other

## 2016-10-12 DIAGNOSIS — J9622 Acute and chronic respiratory failure with hypercapnia: Secondary | ICD-10-CM

## 2016-10-12 LAB — BASIC METABOLIC PANEL
Anion gap: 8 (ref 5–15)
BUN: 21 mg/dL — ABNORMAL HIGH (ref 6–20)
BUN: 20 mg/dL (ref 6–20)
CALCIUM: 9 mg/dL (ref 8.9–10.3)
CALCIUM: 9.2 mg/dL (ref 8.9–10.3)
CO2: 49 mmol/L — ABNORMAL HIGH (ref 22–32)
Chloride: 83 mmol/L — ABNORMAL LOW (ref 101–111)
Chloride: 82 mmol/L — ABNORMAL LOW (ref 101–111)
Creatinine, Ser: 0.94 mg/dL (ref 0.44–1.00)
Creatinine, Ser: 0.89 mg/dL (ref 0.44–1.00)
GFR calc Af Amer: >60 mL/min (ref >60)
GFR, EST NON AFRICAN AMERICAN: 55 mL/min — AB (ref >60)
GFR, EST NON AFRICAN AMERICAN: 59 mL/min — AB (ref >60)
GLUCOSE: 112 mg/dL — AB (ref 65–99)
Glucose, Bld: 108 mg/dL — ABNORMAL HIGH (ref 65–99)
Potassium: 4.5 mmol/L (ref 3.5–5.1)
Potassium: 4.2 mmol/L (ref 3.5–5.1)
SODIUM: 140 mmol/L (ref 135–145)
Sodium: 139 mmol/L (ref 135–145)

## 2016-10-12 LAB — MAGNESIUM: MAGNESIUM: 1.9 mg/dL (ref 1.7–2.4)

## 2016-10-12 LAB — BLOOD GAS, ARTERIAL
ACID-BASE EXCESS: 27.2 mmol/L — AB (ref 0.0–2.0)
BICARBONATE: 54 mmol/L — AB (ref 20.0–28.0)
Drawn by: 448981
O2 CONTENT: 3 L/min
O2 SAT: 97.4 %
PATIENT TEMPERATURE: 97.8
PCO2 ART: 84.7 mmHg — AB (ref 32.0–48.0)
PO2 ART: 76.5 mmHg — AB (ref 83.0–108.0)
pH, Arterial: 7.418 (ref 7.350–7.450)

## 2016-10-12 LAB — CBC
HEMATOCRIT: 34.5 % — AB (ref 36.0–46.0)
Hemoglobin: 10 g/dL — ABNORMAL LOW (ref 12.0–15.0)
MCH: 27.8 pg (ref 26.0–34.0)
MCHC: 29 g/dL — ABNORMAL LOW (ref 30.0–36.0)
MCV: 95.8 fL (ref 78.0–100.0)
PLATELETS: 173 10*3/uL (ref 150–400)
RBC: 3.6 MIL/uL — ABNORMAL LOW (ref 3.87–5.11)
RDW: 16.5 % — AB (ref 11.5–15.5)
WBC: 4.6 10*3/uL (ref 4.0–10.5)

## 2016-10-12 LAB — PROTIME-INR
INR: 1.93
PROTHROMBIN TIME: 22.3 s — AB (ref 11.4–15.2)

## 2016-10-12 MED ORDER — TAMSULOSIN HCL 0.4 MG PO CAPS
0.4000 mg | ORAL_CAPSULE | Freq: Every day | ORAL | Status: DC
  Administered 2016-10-12 – 2016-10-14 (×3): 0.4 mg via ORAL
  Filled 2016-10-12 (×3): qty 1

## 2016-10-12 MED ORDER — ACETAZOLAMIDE 250 MG PO TABS
250.0000 mg | ORAL_TABLET | Freq: Once | ORAL | Status: AC
  Administered 2016-10-12: 250 mg via ORAL
  Filled 2016-10-12: qty 1

## 2016-10-12 MED ORDER — LEVALBUTEROL HCL 0.63 MG/3ML IN NEBU
0.6300 mg | INHALATION_SOLUTION | Freq: Four times a day (QID) | RESPIRATORY_TRACT | Status: DC
  Administered 2016-10-12 – 2016-10-14 (×10): 0.63 mg via RESPIRATORY_TRACT
  Filled 2016-10-12 (×10): qty 3

## 2016-10-12 MED ORDER — METHYLPREDNISOLONE SODIUM SUCC 125 MG IJ SOLR
60.0000 mg | Freq: Four times a day (QID) | INTRAMUSCULAR | Status: DC
  Administered 2016-10-12: 60 mg via INTRAVENOUS
  Filled 2016-10-12: qty 2

## 2016-10-12 MED ORDER — IPRATROPIUM BROMIDE 0.02 % IN SOLN
0.5000 mg | Freq: Four times a day (QID) | RESPIRATORY_TRACT | Status: DC
  Administered 2016-10-12 – 2016-10-14 (×10): 0.5 mg via RESPIRATORY_TRACT
  Filled 2016-10-12 (×10): qty 2.5

## 2016-10-12 MED ORDER — FUROSEMIDE 40 MG PO TABS
60.0000 mg | ORAL_TABLET | Freq: Every day | ORAL | Status: DC
  Administered 2016-10-12: 12:00:00 60 mg via ORAL
  Filled 2016-10-12: qty 1

## 2016-10-12 MED ORDER — METOPROLOL TARTRATE 5 MG/5ML IV SOLN: 5.0000 mg | INTRAVENOUS | Status: AC | PRN

## 2016-10-12 MED ORDER — FUROSEMIDE 40 MG PO TABS
40.0000 mg | ORAL_TABLET | Freq: Every day | ORAL | Status: DC
  Administered 2016-10-14: 40 mg via ORAL
  Filled 2016-10-12: qty 1

## 2016-10-12 MED ORDER — WARFARIN SODIUM 2 MG PO TABS
2.0000 mg | ORAL_TABLET | Freq: Once | ORAL | Status: AC
  Administered 2016-10-12: 2 mg via ORAL
  Filled 2016-10-12: qty 1

## 2016-10-12 MED ORDER — METHYLPREDNISOLONE SODIUM SUCC 40 MG IJ SOLR
40.0000 mg | Freq: Two times a day (BID) | INTRAMUSCULAR | Status: DC
  Administered 2016-10-13 – 2016-10-15 (×6): 40 mg via INTRAVENOUS
  Filled 2016-10-12 (×6): qty 1

## 2016-10-12 NOTE — Progress Notes (Addendum)
Patient attempted to void again without success.  Bladder scan reveals greater than 380 mls urine in bladder.  Will notify MD.

## 2016-10-12 NOTE — Progress Notes (Signed)
RN walked into room and spoke to patient, patient did not respond.  RN spoke louder and then shook patient again with a moan as only response.  Immediately notified MD of change in level of consciousness.  Order reinstated to transfer patient.  Spoke with rapid response RN regarding the above.  Had spoken to rapid response earlier in shift regarding this patient when initial transfer order placed.

## 2016-10-12 NOTE — Procedures (Signed)
Responded to RN call about pt with decreased respiratory effort.  On arrival pt responded to sternal rub.  Pt transfer immediately to 4e20 on [place] with RN.  Upon arrival unit RT waiting with BiPAP ready.  Pt placed on BiPAP.

## 2016-10-12 NOTE — Progress Notes (Signed)
PT Cancellation Note  Patient Details Name: Claudia [name] MRN: [id number] DOB: 01/05/1933   Cancelled Treatment:    Reason Eval/Treat Not Completed: Medical issues which prohibited therapy (Increased CO2 levels post ABG, patient to transfer to step down unit and treatment deferred by nursing at this time.  )   Cristela Blue 10/12/2016, 4:21 PM Governor Rooks, PTA pager [phone]

## 2016-10-12 NOTE — Progress Notes (Signed)
Notified by charge nurse that transfer had been canceled.

## 2016-10-12 NOTE — Progress Notes (Signed)
In and out cath performed per MD order, 300 ccs yellow urine obtained.

## 2016-10-12 NOTE — Progress Notes (Signed)
Foley removed at 05:20 due to impending discharge and Lasix switch to PO. Will continue to monitor and pass on to day shift RN.

## 2016-10-12 NOTE — Progress Notes (Signed)
Notified patients spouse and daughter that patient had been moved to 4E20 to be placed on bipap.  Voiced understanding.  I did give daughter number to nurses station to call if she has any questions regarding patient during the night.

## 2016-10-12 NOTE — Progress Notes (Signed)
CRITICAL VALUE ALERT  Critical value received:  CO2 84.7   Date of notification:  10/12/16  Time of notification:  1200  Critical value read back: yes Nurse who received alert:  Shelton Silvas, RN   MD notified (1st page):  DR. Posey Pronto   Time of first page: 12:45 pm   MD notified (2nd page):  Time of second page:  Responding MD:  Dr. Posey Pronto, came up to see patient   Time MD responded:  13:00

## 2016-10-12 NOTE — Care Management Important Message (Signed)
Important Message  Patient Details  Name: Claudia [name] MRN: [id number] Date of Birth: [birth year]   Medicare Important Message Given:  Yes    Orbie Pyo 10/12/2016, 11:17 AM

## 2016-10-12 NOTE — Progress Notes (Signed)
I was aware of the patient earlier in the afternoon (1435) when the patient initially had SDU transfer orders because of ABG 7.4/84/76/54.  Later in the day the transfer order was canceled because the patient was doing so well.  Then at [place] called me because she was unable to awake patient.  Respiratory Therapy to bedside to assist with transfer to 4E and placed patient on Bipap. RN to call if assistance needed.

## 2016-10-12 NOTE — Progress Notes (Signed)
Patient voided once arround 8 a.m. after foley removed, no hat to record amount.  Patient has not voided since.  Did place patient on bedside commode where she had approximately 25 ccs of urine.  Once back in bed RN did perform a bladder scan and only read as 70 ccs.  Will continue to monitor and perform bladder scan again if no urine output.

## 2016-10-12 NOTE — Progress Notes (Addendum)
Progress Note  Patient Name: Claudia [name] Date of Encounter: 10/12/2016  Primary Cardiologist: Croitrou  Subjective   Feeling better today with less shortness of breath. Does not know that she is in atrial fibrillation currently.  Inpatient Medications    Scheduled Meds: . acetaZOLAMIDE  250 mg Oral Once  . arformoterol  15 mcg Nebulization BID  . budesonide  0.25 mg Nebulization BID  . diltiazem  300 mg Oral BID  . feeding supplement (ENSURE ENLIVE)  237 mL Oral TID BM  . furosemide  40 mg Oral BID  . loratadine  10 mg Oral Daily  . metoprolol tartrate  25 mg Oral BID  . sodium chloride flush  3 mL Intravenous Q12H  . sotalol  80 mg Oral BID  . Warfarin - Pharmacist Dosing Inpatient   Does not apply q1800   Continuous Infusions:  PRN Meds: sodium chloride, acetaminophen, ALPRAZolam, benzocaine, ipratropium-albuterol, ipratropium-albuterol, ondansetron (ZOFRAN) IV, sodium chloride flush   Vital Signs    Vitals:   10/11/16 2100 10/11/16 2255 10/12/16 0500 10/12/16 0521  BP: (!) 130/59  (!) 98/52   Pulse: 96  98   Resp:   18   Temp: 97.6 F (36.4 C)  97.8 F (36.6 C)   TempSrc: Oral  Oral   SpO2: 96% 98% 98%   Weight:    124 lb 9.6 oz (56.5 kg)  Height:        Intake/Output Summary (Last 24 hours) at 10/12/16 0835 Last data filed at 10/12/16 0343  Gross per 24 hour  Intake              600 ml  Output             1100 ml  Net             -500 ml   Filed Weights   10/10/16 0617 10/11/16 0458 10/12/16 0521  Weight: 127 lb 8 oz (57.8 kg) 125 lb 7.1 oz (56.9 kg) 124 lb 9.6 oz (56.5 kg)    Telemetry    Sinus rhythm with episodes of atrial fibrillaiton - Personally Reviewed  ECG    Atrial fibrillation, intermittent V pacing - Personally Reviewed  Physical Exam   GEN: No acute distress.   Neck: No JVD Cardiac: iRRR, no murmurs, rubs, or gallops.  Respiratory: Clear to auscultation bilaterally. GI: Soft, nontender, non-distended  MS: No edema; No  deformity. Neuro:  Nonfocal  Psych: Normal affect   Labs    Chemistry Recent Labs Lab 10/09/16 0525 10/10/16 0557 10/11/16 0356 10/12/16 0425  NA 139 138 136 140  K 4.7 3.8 3.5 4.5  CL 98* 88* 82* 83*  CO2 36* 40* 44* >50*  GLUCOSE 91 97 112* 108*  BUN 16 13 16  21*  CREATININE 0.89 0.83 0.92 0.94  CALCIUM 8.9 9.0 8.7* 9.0  GFRNONAA 59* >60 57* 55*  GFRAA >60 >60 >60 >60  ANIONGAP 5 10 10   --      Hematology Recent Labs Lab 10/09/16 0525 10/11/16 0356 10/12/16 0425  WBC 4.9 5.4 4.6  RBC 3.19* 3.51* 3.60*  HGB 8.9* 9.9* 10.0*  HCT 30.5* 32.8* 34.5*  MCV 95.6 93.4 95.8  MCH 27.9 28.2 27.8  MCHC 29.2* 30.2 29.0*  RDW 16.2* 16.3* 16.5*  PLT 169 181 173    Cardiac Enzymes Recent Labs Lab 10/09/16 0851  TROPONINI <0.03    Recent Labs Lab 10/08/16 1500  TROPIPOC 0.00     BNP Recent Labs Lab  10/08/16 1449  BNP 541.1*     DDimer No results for input(s): DDIMER in the last 168 hours.   Radiology    Ct Angio Chest Pe W Or Wo Contrast  Result Date: 10/10/2016 CLINICAL DATA:  Dyspnea, history of hypertension, pneumonia and COPD. EXAM: CT ANGIOGRAPHY CHEST WITH CONTRAST TECHNIQUE: Multidetector CT imaging of the chest was performed using the standard protocol during bolus administration of intravenous contrast. Multiplanar CT image reconstructions and MIPs were obtained to evaluate the vascular anatomy. CONTRAST:  80 cc Isovue 370 IV COMPARISON:  None. FINDINGS: Cardiovascular: No acute central pulmonary embolus. Aortic atherosclerosis with ectasia of the ascending aorta up to 3.4 cm in diameter. No aortic dissection. Cardiac chambers are normal in size. No pericardial effusion or thickening. Right atrial and right ventricular pacing leads are noted with left-sided pacemaker apparatus overlying the anterior chest wall. Mediastinum/Nodes: Mild thyromegaly with hypodense cystic 1 cm nodule in the left lower pole. Patent trachea and mainstem bronchus with minimal  secretions noted in the upper trachea. No supraclavicular, axillary nor mediastinal lymphadenopathy. No hilar lymphadenopathy. Esophagus is unremarkable for CT appearance. Lungs/Pleura: Bilateral centrilobular emphysema upper lobe predominant with moderate bilateral pleural effusions and compressive atelectasis. Chronic bilateral areas of parenchymal scarring upper lobe predominant. Patchy ill-defined areas of atelectasis and/or scarring are noted bilaterally. Patchy airspace opacities are seen in the upper lobes some which are nodular in appearance. Follow-up to resolution is recommended to exclude pulmonary neoplasm, the largest on the right approximately 14 mm and on the left 10 mm. Bronchiectasis noted to both lower lobes some of which contain inspissated mucus. Upper Abdomen: No acute abnormality. Musculoskeletal: Degenerative change along the dorsal spine. No chest wall abnormality. Review of the MIP images confirms the above findings. IMPRESSION: 1. No large central pulmonary embolus. Aortic atherosclerosis with ectatic appearing ascending aorta measuring up to 3.4 cm. 2. Bilateral centrilobular emphysema with moderate bilateral pleural effusions and adjacent compressive atelectasis. Areas of linear scarring are noted in the setting centrilobular emphysema, upper lobe predominant. Nodular opacities are also noted in both upper lobes measuring up to 14 mm on the right and 10 mm on the left. Follow-up to complete resolution is recommended to assure stability or clearance and to exclude pulmonary neoplasm as the cause of these nodular densities. 3. 10 mm cystic nodule of the left thyroid gland. No worrisome features noted. Electronically Signed   By: Ashley Royalty M.D.   On: 10/10/2016 23:32    Cardiac Studies   - Left ventricle: The cavity size was normal. Systolic function was   normal. The estimated ejection fraction was in the range of 60%   to 65%. Wall motion was normal; there were no regional wall    motion abnormalities. Left ventricular diastolic function   parameters were normal. - Aortic valve: There was mild regurgitation. - Mitral valve: Calcified annulus. - Atrial septum: No defect or patent foramen ovale was identified. - Pulmonary arteries: PA peak pressure: 48 mm Hg (S). - Pericardium, extracardiac: A trivial pericardial effusion was   identified.  Patient Profile     83 y.o. female with a h/o COPD, paroxysmal A. fib, hypertension, hyperlipidemia, tachybradycardia syndrome status post pacemaker placement who presented to the hospital with shortness of breath.  Assessment & Plan    1. Persistent atrial fibrillation: has been in and out of AF since being admitted. It is likely that this Nichole Neyer continue. Unfortunately, Should be a tolerate many other medications for her atrial fibrillation. She does have  lung disease therefore amiodarone to be a poor choice. She likely has some amount of coronary disease as well as she is a long-term smoker with COPD and therefore class 1 agent would be contraindicated. Would continue her on sotalol. She is much less short of breath today. She is in atrial fibrillation currently. It is possible that the atrial fibrillation is not causing her shortness of breath.Did have a discussion with both her and her daughter about the possibility of increasing sotalol, which would put her at a higher risk of QT prolongation with her kidney dysfunction, or AV nodal ablation, which would make her pacemaker dependent. They both would like to continue with current management.  2. Diastolic heart failure, acute on chronic: Appears to be improving with improving volume status at -4.7 L. would continue her oral Lasix and have outpatient basic metabolic tested to see if her kidney function has remained stable  3. Acute on chronic respiratory failure: Status post diuresis with improving respiratory status. Continue diuresis.  Signed, Jamarie Joplin Meredith Leeds, MD  10/12/2016,  8:35 AM

## 2016-10-12 NOTE — Progress Notes (Addendum)
PROGRESS NOTE    Claudia [name]  [id number] DOB: 1933-03-13 DOA: 10/08/2016 PCP: Pcp Not In System   Brief Narrative:  83 y.o. female with medical history significant of COPD, paroxysmal A. fib, hypertension, hyperlipidemia, tachybradycardia syndrome status post pacemaker placement in the last 2 weeks, was discharged home about a week ago from a prolonged hospitalization, requiring PPM placement which was complicated by pneumothorax, COPD exacerbation, A. fib with RVR, steroid-induced delirium, and acute kidney injury. In the ER patient was noted to hypoxic with signs of vol overload.    Assessment & Plan:   Acute on chronic diastolic CHF - class III Severe metabolic alkalosis. -Patient with clinical evidence of significant fluid overload with pitting lower extremity edema, crackles and JVD.  Most recent 2D echo done on 09/13/2016 showed normal EF of 60-65%, and grade 2 diastolic dysfunction.  She is not on any diuretics. -n IV Lasix twice daily, strict I's and O's, daily weights -repeat echo not much different than the one from 4 weeks ago.  -supplemental O2 as needed, wean as appropriate.  -CTA chest is neg for PE but showed b/l centrilobular emphysema and mod bilateral pleural effusions and adjacent atelectasis.  -cardiology consulted.  - Reduce Lasix to 40 mg daily and also given one dose of Diamox.  Acute on chronic hypoxic and hypercarbic respiratory failure -Patient with increased work of breathing. ABG shows mixed respiratory acidosis as well as metabolic alkalosis. Patient neurologically intake. Patient refusing BiPAP as well. Patient is DO NOT RESUSCITATE. Discussed with pulmonary will place the patient on scheduled nebs as well as IV steroids. Transferred to step down unit for close monitoring. Discontinue Xanax and other psychotropic medications.  Addendum: Transferred to step down order was canceled based on the evaluation by rapid response nurse. Staff on the 3 [facility].  was comfortable managing the patient is well.  Claudia [name] 7:03 PM 10/12/2016    Paroxysmal A. Flutter with tachybradycardia syndrome status post pacemaker placement- now back in A fib  -EKG shows underlying atrial flutter, with intermittent conduction as well as paced rhythm -Monitor on telemetry -Continue Coumadin per pharmacy -CHADS VASC score > 2 -Continue home medications including diltiazem, metoprolol, sotalol (given IV diuresis I will decrease the metoprolol dose to avoid hypotension) -Cardiology consulted, given low blood pressure on discontinuing metoprolol, continue Cardizem.  COPD -Patient has very minimal air movement. History of steroid-induced delirium. Given worsening of her ABG with hypercarbia patient will be transferred to step down unit for close monitoring. Currently refusing BiPAP. Schedule nebs and IV steroids. Monitor for delirium.  Hyperkalemia - resolved  -cont to monitor.   Chronic kidney disease stage III -resolved  Right forearm laceration -Status post glue placement per EDP, consult wound in the morning.  No significant bleeding noted, continue Coumadin for now  Acute urinary retention. Etiology unclear probably worsening mentation. Patient had a Foley catheter which was removed yesterday. Currently I would use in and out catheter and start the patient on Flomax. If the patient requires more than 3 times in and out catheter Foley catheter should be inserted the fourth time.  DVT prophylaxis: Coumadin Code Status: DNR Family Communication: Family at bedside Disposition Plan:  transferred to step down  Subjective: More sleepy this morning. Was given Xanax last night. Denies any acute complaint. Breathing is more heavier today.  Objective: Vitals:   10/12/16 1150 10/12/16 1327 10/12/16 1358 10/12/16 1402  BP: 117/68 110/65    Pulse: (!) 102 (!) 113    Resp:  Marland Kitchen)  22    Temp:  97.8 F (36.6 C)    TempSrc:  Oral    SpO2: 96% 97% 95% 96%    Weight:      Height:        Intake/Output Summary (Last 24 hours) at 10/12/16 1740 Last data filed at 10/12/16 1445  Gross per 24 hour  Intake              660 ml  Output              475 ml  Net              185 ml   Filed Weights   10/10/16 0617 10/11/16 0458 10/12/16 0521  Weight: 57.8 kg (127 lb 8 oz) 56.9 kg (125 lb 7.1 oz) 56.5 kg (124 lb 9.6 oz)    Examination:  General exam: Appears calm and comfortable. drwosy Respiratory system: Minimal air movement. Cardiovascular system: S1 & S2 heard, RRR. No JVD, murmurs, rubs, gallops or clicks. No pedal edema. Gastrointestinal system: Abdomen is nondistended, soft and nontender. No organomegaly or masses felt. Normal bowel sounds heard. Central nervous system: Alert and oriented. No focal neurological deficits. Extremities: Symmetric 5 x 5 power. Skin: No rashes, lesions or ulcers Psychiatry: Judgement and insight appear normal. Mood & affect appropriate.     Data Reviewed:   CBC:  Recent Labs Lab 10/08/16 1449 10/09/16 0525 10/11/16 0356 10/12/16 0425  WBC 6.2 4.9 5.4 4.6  NEUTROABS 4.3  --   --   --   HGB 9.5* 8.9* 9.9* 10.0*  HCT 32.4* 30.5* 32.8* 34.5*  MCV 96.4 95.6 93.4 95.8  PLT 204 169 181 [id number]   Basic Metabolic Panel:  Recent Labs Lab 10/09/16 0525 10/10/16 0557 10/11/16 0356 10/12/16 0425 10/12/16 1208  NA 139 138 136 140 139  K 4.7 3.8 3.5 4.5 4.2  CL 98* 88* 82* 83* 82*  CO2 36* 40* 44* >50* 49*  GLUCOSE 91 97 112* 108* 112*  BUN 16 13 16  21* 20  CREATININE 0.89 0.83 0.92 0.94 0.89  CALCIUM 8.9 9.0 8.7* 9.0 9.2  MG  --   --  1.5* 1.9  --    GFR: Estimated Creatinine Clearance: 39.2 mL/min (by C-G formula based on SCr of 0.89 mg/dL). Liver Function Tests: No results for input(s): AST, ALT, ALKPHOS, BILITOT, PROT, ALBUMIN in the last 168 hours. No results for input(s): LIPASE, AMYLASE in the last 168 hours. No results for input(s): AMMONIA in the last 168 hours. Coagulation  Profile:  Recent Labs Lab 10/08/16 1815 10/09/16 0525 10/10/16 0557 10/11/16 0356 10/12/16 0425  INR 3.57 3.51 2.89 2.22 1.93   Cardiac Enzymes:  Recent Labs Lab 10/09/16 0851  TROPONINI <0.03   BNP (last 3 results) No results for input(s): PROBNP in the last 8760 hours. HbA1C: No results for input(s): HGBA1C in the last 72 hours. CBG: No results for input(s): GLUCAP in the last 168 hours. Lipid Profile: No results for input(s): CHOL, HDL, LDLCALC, TRIG, CHOLHDL, LDLDIRECT in the last 72 hours. Thyroid Function Tests: No results for input(s): TSH, T4TOTAL, FREET4, T3FREE, THYROIDAB in the last 72 hours. Anemia Panel: No results for input(s): VITAMINB12, FOLATE, FERRITIN, TIBC, IRON, RETICCTPCT in the last 72 hours. Sepsis Labs: No results for input(s): PROCALCITON, LATICACIDVEN in the last 168 hours.  No results found for this or any previous visit (from the past 240 hour(s)).       Radiology Studies: Ct Angio Chest Pe W  Or Wo Contrast  Result Date: 10/10/2016 CLINICAL DATA:  Dyspnea, history of hypertension, pneumonia and COPD. EXAM: CT ANGIOGRAPHY CHEST WITH CONTRAST TECHNIQUE: Multidetector CT imaging of the chest was performed using the standard protocol during bolus administration of intravenous contrast. Multiplanar CT image reconstructions and MIPs were obtained to evaluate the vascular anatomy. CONTRAST:  80 cc Isovue 370 IV COMPARISON:  None. FINDINGS: Cardiovascular: No acute central pulmonary embolus. Aortic atherosclerosis with ectasia of the ascending aorta up to 3.4 cm in diameter. No aortic dissection. Cardiac chambers are normal in size. No pericardial effusion or thickening. Right atrial and right ventricular pacing leads are noted with left-sided pacemaker apparatus overlying the anterior chest wall. Mediastinum/Nodes: Mild thyromegaly with hypodense cystic 1 cm nodule in the left lower pole. Patent trachea and mainstem bronchus with minimal secretions  noted in the upper trachea. No supraclavicular, axillary nor mediastinal lymphadenopathy. No hilar lymphadenopathy. Esophagus is unremarkable for CT appearance. Lungs/Pleura: Bilateral centrilobular emphysema upper lobe predominant with moderate bilateral pleural effusions and compressive atelectasis. Chronic bilateral areas of parenchymal scarring upper lobe predominant. Patchy ill-defined areas of atelectasis and/or scarring are noted bilaterally. Patchy airspace opacities are seen in the upper lobes some which are nodular in appearance. Follow-up to resolution is recommended to exclude pulmonary neoplasm, the largest on the right approximately 14 mm and on the left 10 mm. Bronchiectasis noted to both lower lobes some of which contain inspissated mucus. Upper Abdomen: No acute abnormality. Musculoskeletal: Degenerative change along the dorsal spine. No chest wall abnormality. Review of the MIP images confirms the above findings. IMPRESSION: 1. No large central pulmonary embolus. Aortic atherosclerosis with ectatic appearing ascending aorta measuring up to 3.4 cm. 2. Bilateral centrilobular emphysema with moderate bilateral pleural effusions and adjacent compressive atelectasis. Areas of linear scarring are noted in the setting centrilobular emphysema, upper lobe predominant. Nodular opacities are also noted in both upper lobes measuring up to 14 mm on the right and 10 mm on the left. Follow-up to complete resolution is recommended to assure stability or clearance and to exclude pulmonary neoplasm as the cause of these nodular densities. 3. 10 mm cystic nodule of the left thyroid gland. No worrisome features noted. Electronically Signed   By: Ashley Royalty M.D.   On: 10/10/2016 23:32        Scheduled Meds: . budesonide  0.25 mg Nebulization BID  . diltiazem  300 mg Oral BID  . feeding supplement (ENSURE ENLIVE)  237 mL Oral TID BM  . [START ON 10/13/2016] furosemide  40 mg Oral Daily  . ipratropium  0.5 mg  Nebulization Q6H  . levalbuterol  0.63 mg Nebulization Q6H  . loratadine  10 mg Oral Daily  . methylPREDNISolone (SOLU-MEDROL) injection  60 mg Intravenous Q6H  . sodium chloride flush  3 mL Intravenous Q12H  . sotalol  80 mg Oral BID  . tamsulosin  0.4 mg Oral QPC supper  . Warfarin - Pharmacist Dosing Inpatient   Does not apply q1800   Continuous Infusions:   LOS: 4 days    Time spent: 35 mins   Author:  Berle Mull, MD [facility] Pager: [phone] 10/12/2016 5:44 PM     If 7PM-7AM, please contact night-coverage [email or web address] Password [email or web address] 10/12/2016, 5:40 PM

## 2016-10-12 NOTE — Progress Notes (Signed)
[facility] for Warfarin Indication: atrial fibrillation   Allergies  Allergen Reactions  . Augmentin [Amoxicillin-Pot Clavulanate] Shortness Of Breath  . Doxycycline Shortness Of Breath  . Nitrofurantoin Shortness Of Breath  . Acyclovir And Related   . Amiodarone Hcl Nausea And Vomiting  . Clarithromycin Other (See Comments)    Crazy feeling, confusion  . Clonazepam Other (See Comments)    Doesn't remember reaction  . Codeine Nausea And Vomiting  . Diltiazem Hcl Nausea And Vomiting  . Fish Oil Nausea And Vomiting  . Flecainide Nausea Only  . Multaq [Dronedarone] Nausea Only  . Rosuvastatin Other (See Comments)    Leg cramps   . Zetia [Ezetimibe] Other (See Comments)    Leg cramps    Patient Measurements: Height: 5\' 2"  (157.5 cm) Weight: 124 lb 9.6 oz (56.5 kg) (scale c) IBW/kg (Calculated) : 50.1   Vital Signs: Temp: 97.8 F (36.6 C) (03/01 0500) Temp Source: Oral (03/01 0500) BP: 98/52 (03/01 0500) Pulse Rate: 98 (03/01 0500)  Labs:  Recent Labs  10/10/16 0557 10/11/16 0356 10/12/16 0425  HGB  --  9.9* 10.0*  HCT  --  32.8* 34.5*  PLT  --  181 173  LABPROT 30.8* 25.0* 22.3*  INR 2.89 2.22 1.93  CREATININE 0.83 0.92 0.94    Estimated Creatinine Clearance: 37.1 mL/min (by C-G formula based on SCr of 0.94 mg/dL).  [name] admitted for SOB on PTA warfarin for Afib. Pharmacy consulted to dose warfarin.  INR on admission is elevated at 3.57 with last dose taken on 2/24.   PTA dose: 2 mg on MWF and 1 mg AOD's per last clinic note  INR today continues to trend down to 1.9 likely due to holding 2/26 low dose given 2/27. Hgb improved overnight, no bleeding issues noted.   Goal of Therapy:  INR 2-3 Monitor platelets by anticoagulation protocol: Yes   Plan:  Warfarin 2mg  tonight  Daily INR Monitor s/sx of bleeding  Erin Hearing PharmD., BCPS Clinical Pharmacist Pager [phone] 10/12/2016 11:24 AM

## 2016-10-12 NOTE — Progress Notes (Signed)
Gave report to RN on [facility] pending transfer.

## 2016-10-13 DIAGNOSIS — Z515 Encounter for palliative care: Secondary | ICD-10-CM

## 2016-10-13 DIAGNOSIS — I481 Persistent atrial fibrillation: Secondary | ICD-10-CM

## 2016-10-13 LAB — CBC
HEMATOCRIT: 37.4 % (ref 36.0–46.0)
HEMOGLOBIN: 10.4 g/dL — AB (ref 12.0–15.0)
MCH: 27.7 pg (ref 26.0–34.0)
MCHC: 27.8 g/dL — ABNORMAL LOW (ref 30.0–36.0)
MCV: 99.5 fL (ref 78.0–100.0)
Platelets: 192 10*3/uL (ref 150–400)
RBC: 3.76 MIL/uL — ABNORMAL LOW (ref 3.87–5.11)
RDW: 16.9 % — ABNORMAL HIGH (ref 11.5–15.5)
WBC: 7.1 10*3/uL (ref 4.0–10.5)

## 2016-10-13 LAB — BASIC METABOLIC PANEL
BUN: 33 mg/dL — ABNORMAL HIGH (ref 6–20)
CALCIUM: 9.6 mg/dL (ref 8.9–10.3)
CO2: >50 mmol/L — ABNORMAL HIGH (ref 22–32)
Chloride: 80 mmol/L — ABNORMAL LOW (ref 101–111)
Creatinine, Ser: 1.11 mg/dL — ABNORMAL HIGH (ref 0.44–1.00)
GFR, EST AFRICAN AMERICAN: 53 mL/min — AB (ref >60)
GFR, EST NON AFRICAN AMERICAN: 45 mL/min — AB (ref >60)
Glucose, Bld: 158 mg/dL — ABNORMAL HIGH (ref 65–99)
POTASSIUM: 4.6 mmol/L (ref 3.5–5.1)
SODIUM: 140 mmol/L (ref 135–145)

## 2016-10-13 LAB — MRSA PCR SCREENING: MRSA BY PCR: NEGATIVE

## 2016-10-13 LAB — MAGNESIUM: MAGNESIUM: 2.3 mg/dL (ref 1.7–2.4)

## 2016-10-13 LAB — PROTIME-INR
INR: 2.07
PROTHROMBIN TIME: 23.6 s — AB (ref 11.4–15.2)

## 2016-10-13 MED ORDER — WARFARIN SODIUM 2 MG PO TABS
2.0000 mg | ORAL_TABLET | Freq: Once | ORAL | Status: AC
  Administered 2016-10-13: 2 mg via ORAL
  Filled 2016-10-13: qty 1

## 2016-10-13 MED ORDER — MAGNESIUM SULFATE 2 GM/50ML IV SOLN
2.0000 g | Freq: Once | INTRAVENOUS | Status: AC
  Administered 2016-10-14: 2 g via INTRAVENOUS
  Filled 2016-10-13: qty 50

## 2016-10-13 MED ORDER — METOPROLOL TARTRATE 5 MG/5ML IV SOLN
2.5000 mg | INTRAVENOUS | Status: DC | PRN
  Administered 2016-10-13 – 2016-10-15 (×2): 2.5 mg via INTRAVENOUS
  Filled 2016-10-13 (×2): qty 5

## 2016-10-13 NOTE — Progress Notes (Signed)
Progress Note  Patient Name: Claudia [name] Date of Encounter: 10/13/2016  Primary Cardiologist: Cro[place]  Subjective   Became much more short of breath overnight and was moved to 4E and placed on BiPAP. Remains in atrial fibrillation, rates 100-120 bpm.  Inpatient Medications    Scheduled Meds: . budesonide  0.25 mg Nebulization BID  . diltiazem  300 mg Oral BID  . feeding supplement (ENSURE ENLIVE)  237 mL Oral TID BM  . furosemide  40 mg Oral Daily  . ipratropium  0.5 mg Nebulization Q6H  . levalbuterol  0.63 mg Nebulization Q6H  . loratadine  10 mg Oral Daily  . methylPREDNISolone (SOLU-MEDROL) injection  40 mg Intravenous Q12H  . sodium chloride flush  3 mL Intravenous Q12H  . sotalol  80 mg Oral BID  . tamsulosin  0.4 mg Oral QPC supper  . Warfarin - Pharmacist Dosing Inpatient   Does not apply q1800   Continuous Infusions:  PRN Meds: sodium chloride, acetaminophen, benzocaine, ondansetron (ZOFRAN) IV, sodium chloride flush   Vital Signs    Vitals:   10/13/16 0400 10/13/16 0500 10/13/16 0600 10/13/16 0700  BP: 114/63 (!) 101/57 104/61 91/62  Pulse: (!) 119 (!) 112 (!) 101 (!) 110  Resp: (!) 23 17 (!) 24 (!) 24  Temp:    97.9 F (36.6 C)  TempSrc:    Axillary  SpO2: 95% 94% 93% 94%  Weight:      Height:        Intake/Output Summary (Last 24 hours) at 10/13/16 0819 Last data filed at 10/12/16 2245  Gross per 24 hour  Intake              183 ml  Output              325 ml  Net             -142 ml   Filed Weights   10/11/16 0458 10/12/16 0521 10/13/16 0310  Weight: 125 lb 7.1 oz (56.9 kg) 124 lb 9.6 oz (56.5 kg) 126 lb 14.4 oz (57.6 kg)    Telemetry    Sinus rhythm with episodes of atrial fibrillaiton - Personally Reviewed  ECG    Atrial fibrillation, intermittent V pacing - Personally Reviewed  Physical Exam   GEN: No acute distress.   Neck: No JVD Cardiac: iRRR, no murmurs, rubs, or gallops.  Respiratory: Clear to auscultation  bilaterally. GI: Soft, nontender, non-distended  MS: No edema; No deformity. Neuro:  Nonfocal  Psych: Normal affect   Labs    Chemistry  Recent Labs Lab 10/10/16 0557 10/11/16 0356 10/12/16 0425 10/12/16 1208 10/13/16 0231  NA 138 136 140 139 140  K 3.8 3.5 4.5 4.2 4.6  CL 88* 82* 83* 82* 80*  CO2 40* 44* >50* 49* >50*  GLUCOSE 97 112* 108* 112* 158*  BUN 13 16 21* 20 33*  CREATININE 0.83 0.92 0.94 0.89 1.11*  CALCIUM 9.0 8.7* 9.0 9.2 9.6  GFRNONAA >60 57* 55* 59* 45*  GFRAA >60 >60 >60 >60 53*  ANIONGAP 10 10  --  8  --      Hematology  Recent Labs Lab 10/11/16 0356 10/12/16 0425 10/13/16 0231  WBC 5.4 4.6 7.1  RBC 3.51* 3.60* 3.76*  HGB 9.9* 10.0* 10.4*  HCT 32.8* 34.5* 37.4  MCV 93.4 95.8 99.5  MCH 28.2 27.8 27.7  MCHC 30.2 29.0* 27.8*  RDW 16.3* 16.5* 16.9*  PLT 181 173 192    Cardiac  Enzymes  Recent Labs Lab 10/09/16 0851  TROPONINI <0.03     Recent Labs Lab 10/08/16 1500  TROPIPOC 0.00     BNP  Recent Labs Lab 10/08/16 1449  BNP 541.1*     DDimer No results for input(s): DDIMER in the last 168 hours.   Radiology    No results found.  Cardiac Studies   - Left ventricle: The cavity size was normal. Systolic function was   normal. The estimated ejection fraction was in the range of 60%   to 65%. Wall motion was normal; there were no regional wall   motion abnormalities. Left ventricular diastolic function   parameters were normal. - Aortic valve: There was mild regurgitation. - Mitral valve: Calcified annulus. - Atrial septum: No defect or patent foramen ovale was identified. - Pulmonary arteries: PA peak pressure: 48 mm Hg (S). - Pericardium, extracardiac: A trivial pericardial effusion was   identified.  Patient Profile     83 y.o. female with a h/o COPD, paroxysmal A. fib, hypertension, hyperlipidemia, tachybradycardia syndrome status post pacemaker placement who presented to the hospital with shortness of  breath.  Assessment & Plan    1. Persistent atrial fibrillation: has been in and out of AF since being admitted. It is likely that this Adolphe Fortunato continue. Unfortunately, BP is currently low and would not be able to tolerate any further medications for rate control. Would continue her on sotalol, diltiazem. She may be moving towards AV nodal ablation, but would like to see if her rhythm calms down as he respiratory status improves.  2. Diastolic heart failure, acute on chronic: Appears to be improving with improving volume status at -4.8 L. would continue her oral Lasix and have outpatient basic metabolic tested to see if her kidney function has remained stable  3. Acute on chronic respiratory failure likely due to COPD: Respiratory status worsened overnight, now on BiPAP. May also be AF contributing but was feeling well yesterday in AF in the morning. If her respiratory status does not improve, would consider palliative consult.  Signed, Odell Fasching Meredith Leeds, MD  10/13/2016, 8:19 AM

## 2016-10-13 NOTE — Progress Notes (Signed)
Pt unable to participate in [place]. Very somnolent. Called daughter. Plan is to met 3/3 at 10 am for GOC discussion specifically introduction to hospice care, symptom mgt with end stage COPD Romona Curls, ANP

## 2016-10-13 NOTE — Progress Notes (Signed)
PT Cancellation Note  Patient Details Name: Claudia [name] MRN: [id number] DOB: 06/07/1933   Cancelled Treatment:    Reason Eval/Treat Not Completed: Patient's level of consciousness. Despite auditory, tactile, movement and painful stimuli, pt remained asleep and lethargic. She did not open her eyes or respond to any stimuli. PT will f/u with pt as appropriate.   [place] 10/13/2016, 1:52 PM

## 2016-10-13 NOTE — Progress Notes (Signed)
PROGRESS NOTE    Claudia [name]  [id number] DOB: Dec 23, 1932 DOA: 10/08/2016 PCP: Pcp Not In System   Brief Narrative:  83 y.o. female with medical history significant of COPD, paroxysmal A. fib, hypertension, hyperlipidemia, tachybradycardia syndrome status post pacemaker placement in the last 2 weeks, was discharged home about a week ago from a prolonged hospitalization, requiring PPM placement which was complicated by pneumothorax, COPD exacerbation, A. fib with RVR, steroid-induced delirium, and acute kidney injury. In the ER patient was noted to hypoxic with signs of vol overload.    Assessment & Plan:   Acute on chronic diastolic CHF - class III Severe metabolic alkalosis. -Patient with clinical evidence of significant fluid overload with pitting lower extremity edema, crackles and JVD.  Most recent 2D echo done on 09/13/2016 showed normal EF of 60-65%, and grade 2 diastolic dysfunction.  She is not on any diuretics. -n IV Lasix twice daily, strict I's and O's, daily weights -repeat echo not much different than the one from 4 weeks ago.  -supplemental O2 as needed, wean as appropriate.  -CTA chest is neg for PE but showed b/l centrilobular emphysema and mod bilateral pleural effusions and adjacent atelectasis.  -cardiology consulted.  - Reduce Lasix to 40 mg daily  Acute on chronic hypoxic and hypercarbic respiratory failure -Patient with increased work of breathing. ABG shows mixed respiratory acidosis as well as metabolic alkalosis. Patient neurologically intake. Patient does not prefer the use of BiPAP but tolerated it well overnight. Without the BiPAP patient is lethargic. Patient is DO NOT RESUSCITATE. Discussed with pulmonary will place the patient on scheduled nebs as well as IV steroids. Continue in stepdown. Discontinue Xanax and other psychotropic medications.  Paroxysmal A. Flutter with tachybradycardia syndrome status post pacemaker placement- now back in A fib    -EKG shows underlying atrial flutter, with intermittent conduction as well as paced rhythm -Monitor on telemetry -Continue Coumadin per pharmacy -CHADS VASC score > 2 -Continue home medications including diltiazem, metoprolol, sotalol (given IV diuresis I will decrease the metoprolol dose to avoid hypotension) -Cardiology consulted, given low blood pressure on discontinuing metoprolol, continue Cardizem.  Severe COPD -Patient has very minimal air movement. History of steroid-induced delirium. Has developed acute on chronic hypercarbic respiratory failure. Likely from Xanax. Discussed with patient's daughter regarding her prognosis. Patient appears to have end-stage COPD with recurrent admission for exacerbation. At present I'm concerned that controlling her symptoms for anxiety and air hunger are difficult due to worsening of hypercarbia and patient is unable to tolerate BiPAP for prolonged time. Family is currently agreeable to discuss goals of care with palliative care. Patient would ideally benefit from discharge to hospice at SNF or home with hospice.  Hyperkalemia - resolved  -cont to monitor.   Chronic kidney disease stage III -resolved  Right forearm laceration -Status post glue placement per EDP, consult wound in the morning.  No significant bleeding noted, continue Coumadin for now  Acute urinary retention. Etiology unclear probably worsening mentation. Patient had a Foley catheter which was removed 10/11/2016. On Flomax. Monitor for renal output. Bladder scan as needed.  DVT prophylaxis: Coumadin Code Status: DNR Family Communication: Family at bedside Disposition Plan:  transferred to step down  Subjective: Awake on BiPAP but still appears lethargic. No acute complaints.  Objective: Vitals:   10/13/16 1320 10/13/16 1323 10/13/16 1647 10/13/16 1900  BP:   (!) 95/53 101/82  Pulse:   (!) 129   Resp:   20 15  Temp:   97.5  F (36.4 C)   TempSrc:   Axillary    SpO2: 99% 99% 100%   Weight:      Height:        Intake/Output Summary (Last 24 hours) at 10/13/16 1930 Last data filed at 10/12/16 2245  Gross per 24 hour  Intake                3 ml  Output                0 ml  Net                3 ml   Filed Weights   10/11/16 0458 10/12/16 0521 10/13/16 0310  Weight: 56.9 kg (125 lb 7.1 oz) 56.5 kg (124 lb 9.6 oz) 57.6 kg (126 lb 14.4 oz)    Examination:  General exam: Appears calm and comfortable. drwosy Respiratory system: Minimal air movement. Cardiovascular system: S1 & S2 heard, RRR. No JVD, murmurs, rubs, gallops or clicks. No pedal edema. Gastrointestinal system: Abdomen is nondistended, soft and nontender. No organomegaly or masses felt. Normal bowel sounds heard. Central nervous system: Alert and oriented. No focal neurological deficits. Extremities: Symmetric 5 x 5 power. Skin: No rashes, lesions or ulcers Psychiatry: Judgement and insight appear normal. Mood & affect appropriate.     Data Reviewed:   CBC:  Recent Labs Lab 10/08/16 1449 10/09/16 0525 10/11/16 0356 10/12/16 0425 10/13/16 0231  WBC 6.2 4.9 5.4 4.6 7.1  NEUTROABS 4.3  --   --   --   --   HGB 9.5* 8.9* 9.9* 10.0* 10.4*  HCT 32.4* 30.5* 32.8* 34.5* 37.4  MCV 96.4 95.6 93.4 95.8 99.5  PLT 204 169 181 173 [id number]   Basic Metabolic Panel:  Recent Labs Lab 10/10/16 0557 10/11/16 0356 10/12/16 0425 10/12/16 1208 10/13/16 0231  NA 138 136 140 139 140  K 3.8 3.5 4.5 4.2 4.6  CL 88* 82* 83* 82* 80*  CO2 40* 44* >50* 49* >50*  GLUCOSE 97 112* 108* 112* 158*  BUN 13 16 21* 20 33*  CREATININE 0.83 0.92 0.94 0.89 1.11*  CALCIUM 9.0 8.7* 9.0 9.2 9.6  MG  --  1.5* 1.9  --  2.3   GFR: Estimated Creatinine Clearance: 31.4 mL/min (by C-G formula based on SCr of 1.11 mg/dL (H)). Liver Function Tests: No results for input(s): AST, ALT, ALKPHOS, BILITOT, PROT, ALBUMIN in the last 168 hours. No results for input(s): LIPASE, AMYLASE in the last 168 hours. No  results for input(s): AMMONIA in the last 168 hours. Coagulation Profile:  Recent Labs Lab 10/09/16 0525 10/10/16 0557 10/11/16 0356 10/12/16 0425 10/13/16 0231  INR 3.51 2.89 2.22 1.93 2.07   Cardiac Enzymes:  Recent Labs Lab 10/09/16 0851  TROPONINI <0.03   BNP (last 3 results) No results for input(s): PROBNP in the last 8760 hours. HbA1C: No results for input(s): HGBA1C in the last 72 hours. CBG: No results for input(s): GLUCAP in the last 168 hours. Lipid Profile: No results for input(s): CHOL, HDL, LDLCALC, TRIG, CHOLHDL, LDLDIRECT in the last 72 hours. Thyroid Function Tests: No results for input(s): TSH, T4TOTAL, FREET4, T3FREE, THYROIDAB in the last 72 hours. Anemia Panel: No results for input(s): VITAMINB12, FOLATE, FERRITIN, TIBC, IRON, RETICCTPCT in the last 72 hours. Sepsis Labs: No results for input(s): PROCALCITON, LATICACIDVEN in the last 168 hours.  Recent Results (from the past 240 hour(s))  MRSA PCR Screening     Status: None   Collection Time:  10/12/16 10:22 PM  Result Value Ref Range Status   MRSA by PCR NEGATIVE NEGATIVE Final    Comment:        The GeneXpert MRSA Assay (FDA approved for NASAL specimens only), is one component of a comprehensive MRSA colonization surveillance program. It is not intended to diagnose MRSA infection nor to guide or monitor treatment for MRSA infections.          Radiology Studies: No results found.      Scheduled Meds: . budesonide  0.25 mg Nebulization BID  . diltiazem  300 mg Oral BID  . feeding supplement (ENSURE ENLIVE)  237 mL Oral TID BM  . furosemide  40 mg Oral Daily  . ipratropium  0.5 mg Nebulization Q6H  . levalbuterol  0.63 mg Nebulization Q6H  . loratadine  10 mg Oral Daily  . methylPREDNISolone (SOLU-MEDROL) injection  40 mg Intravenous Q12H  . sodium chloride flush  3 mL Intravenous Q12H  . sotalol  80 mg Oral BID  . tamsulosin  0.4 mg Oral QPC supper  . warfarin  2 mg Oral  ONCE-1800  . Warfarin - Pharmacist Dosing Inpatient   Does not apply q1800   Continuous Infusions:   LOS: 5 days    Time spent: 35 mins   Author:  Berle Mull, MD [facility] Pager: [phone] 10/13/2016 7:30 PM     If 7PM-7AM, please contact night-coverage [email or web address] Password [email or web address] 10/13/2016, 7:30 PM

## 2016-10-13 NOTE — Progress Notes (Signed)
Pt taken off BiPAP and placed on 4L N/C.  Pt is resting with no signs of distress.  Will continue to monitor.

## 2016-10-13 NOTE — Progress Notes (Signed)
[place] for Warfarin Indication: atrial fibrillation   Allergies  Allergen Reactions  . Augmentin [Amoxicillin-Pot Clavulanate] Shortness Of Breath  . Doxycycline Shortness Of Breath  . Nitrofurantoin Shortness Of Breath  . Acyclovir And Related   . Amiodarone Hcl Nausea And Vomiting  . Clarithromycin Other (See Comments)    Crazy feeling, confusion  . Clonazepam Other (See Comments)    Doesn't remember reaction  . Codeine Nausea And Vomiting  . Diltiazem Hcl Nausea And Vomiting  . Fish Oil Nausea And Vomiting  . Flecainide Nausea Only  . Multaq [Dronedarone] Nausea Only  . Rosuvastatin Other (See Comments)    Leg cramps   . Zetia [Ezetimibe] Other (See Comments)    Leg cramps    Patient Measurements: Height: 5\' 2"  (157.5 cm) Weight: 126 lb 14.4 oz (57.6 kg) IBW/kg (Calculated) : 50.1   Vital Signs: Temp: 97.9 F (36.6 C) (03/02 0700) Temp Source: Axillary (03/02 0700) BP: 98/57 (03/02 0903) Pulse Rate: 109 (03/02 0903)  Labs:  Recent Labs  10/11/16 0356 10/12/16 0425 10/12/16 1208 10/13/16 0231  HGB 9.9* 10.0*  --  10.4*  HCT 32.8* 34.5*  --  37.4  PLT 181 173  --  192  LABPROT 25.0* 22.3*  --  23.6*  INR 2.22 1.93  --  2.07  CREATININE 0.92 0.94 0.89 1.11*    Estimated Creatinine Clearance: 31.4 mL/min (by C-G formula based on SCr of 1.11 mg/dL (H)).  [name] admitted for SOB on PTA warfarin for Afib. Pharmacy consulted to dose warfarin.  INR on admission is elevated at 3.57 with last dose taken on 2/24.   PTA dose: 2 mg on MWF and 1 mg AOD's per last clinic note  INR therapeutic today at 2.07. CBC stable.   Goal of Therapy:  INR 2-3 Monitor platelets by anticoagulation protocol: Yes   Plan:  Repeat Warfarin 2mg  tonight  Daily INR Monitor s/sx of bleeding  Vincenza Hews, PharmD, BCPS 10/13/2016, 9:28 AM

## 2016-10-13 NOTE — Clinical Social Work Note (Signed)
Patient transferred from [place] to 4E. Handoff information given to 4E CSW.  This CSW signing off.  Day[place] Scrape, [place]

## 2016-10-14 LAB — CBC
HCT: 32.8 % — ABNORMAL LOW (ref 36.0–46.0)
Hemoglobin: 9.5 g/dL — ABNORMAL LOW (ref 12.0–15.0)
MCH: 27.9 pg (ref 26.0–34.0)
MCHC: 29 g/dL — ABNORMAL LOW (ref 30.0–36.0)
MCV: 96.5 fL (ref 78.0–100.0)
PLATELETS: 186 10*3/uL (ref 150–400)
RBC: 3.4 MIL/uL — ABNORMAL LOW (ref 3.87–5.11)
RDW: 16.3 % — AB (ref 11.5–15.5)
WBC: 6.9 10*3/uL (ref 4.0–10.5)

## 2016-10-14 LAB — BASIC METABOLIC PANEL
Anion gap: 11 (ref 5–15)
BUN: 45 mg/dL — ABNORMAL HIGH (ref 6–20)
CO2: 47 mmol/L — ABNORMAL HIGH (ref 22–32)
Calcium: 9.4 mg/dL (ref 8.9–10.3)
Chloride: 79 mmol/L — ABNORMAL LOW (ref 101–111)
Creatinine, Ser: 1.08 mg/dL — ABNORMAL HIGH (ref 0.44–1.00)
GFR calc Af Amer: 54 mL/min — ABNORMAL LOW (ref >60)
GFR calc non Af Amer: 47 mL/min — ABNORMAL LOW (ref >60)
Glucose, Bld: 124 mg/dL — ABNORMAL HIGH (ref 65–99)
Potassium: 4.6 mmol/L (ref 3.5–5.1)
Sodium: 137 mmol/L (ref 135–145)

## 2016-10-14 LAB — PROTIME-INR
INR: 2.32
PROTHROMBIN TIME: 25.9 s — AB (ref 11.4–15.2)

## 2016-10-14 LAB — MAGNESIUM: MAGNESIUM: 3 mg/dL — AB (ref 1.7–2.4)

## 2016-10-14 MED ORDER — ALPRAZOLAM 0.25 MG PO TABS
0.2500 mg | ORAL_TABLET | Freq: Every evening | ORAL | Status: DC | PRN
  Administered 2016-10-14: 0.25 mg via ORAL
  Filled 2016-10-14: qty 1

## 2016-10-14 MED ORDER — WARFARIN SODIUM 1 MG PO TABS
1.0000 mg | ORAL_TABLET | Freq: Once | ORAL | Status: AC
  Administered 2016-10-14: 1 mg via ORAL
  Filled 2016-10-14: qty 1

## 2016-10-14 MED ORDER — METOPROLOL TARTRATE 12.5 MG HALF TABLET
12.5000 mg | ORAL_TABLET | Freq: Two times a day (BID) | ORAL | Status: DC
  Administered 2016-10-14 – 2016-10-15 (×3): 12.5 mg via ORAL
  Filled 2016-10-14 (×3): qty 1

## 2016-10-14 NOTE — Progress Notes (Signed)
PROGRESS NOTE    Claudia [name]  [id number] DOB: 1932-11-02 DOA: 10/08/2016 PCP: Pcp Not In System   Brief Narrative:  83 y.o. female with medical history significant of COPD, paroxysmal A. fib, hypertension, hyperlipidemia, tachybradycardia syndrome status post pacemaker placement in the last 2 weeks, was discharged home about a week ago from a prolonged hospitalization, requiring PPM placement which was complicated by pneumothorax, COPD exacerbation, A. fib with RVR, steroid-induced delirium, and acute kidney injury. In the ER patient was noted to hypoxic with signs of vol overload.   Assessment & Plan:   Acute on chronic diastolic CHF - class III Severe metabolic alkalosis. -Patient with clinical evidence of significant fluid overload with pitting lower extremity edema, crackles and JVD.  Most recent 2D echo done on 09/13/2016 showed normal EF of 60-65%, and grade 2 diastolic dysfunction. -repeat echo not much different than the one from 4 weeks ago.  -supplemental O2 as needed, wean as appropriate.  -CTA chest is neg for PE but showed b/l centrilobular emphysema and mod bilateral pleural effusions and adjacent atelectasis.  -cardiology consulted. Currently due to hypotension I am not continuing Lasix at present monitor and use when necessary.  Acute on chronic hypoxic and hypercarbic respiratory failure -Patient with increased work of breathing. ABG shows mixed respiratory acidosis as well as metabolic alkalosis. Patient neurologically intake. Patient does not prefer the use of BiPAP but tolerated it well overnight. Without the BiPAP patient is lethargic. Patient is DO NOT RESUSCITATE. Discussed with pulmonary, patient on scheduled nebs as well as IV steroids. Continue in stepdown. See goals of care discussion  Paroxysmal A. Flutter with tachybradycardia syndrome status post pacemaker placement- now back in A fib  -EKG shows underlying atrial flutter, with intermittent  conduction as well as paced rhythm -Monitor on telemetry -Continue Coumadin per pharmacy -CHADS VASC score > 2  -Cardiology consulted, given low blood pressure on discontinuing metoprolol, continue Cardizem and Betapace.  Severe COPD  goals of care discussion -Patient has very minimal air movement. History of steroid-induced delirium. Has developed acute on chronic hypercarbic respiratory failure. Likely from Xanax. Discussed with patient's daughter regarding her prognosis. Patient appears to have end-stage COPD with recurrent admission for exacerbation. At present I'm concerned that controlling her symptoms for anxiety and air hunger are difficult due to worsening of hypercarbia and patient is unable to tolerate BiPAP for prolonged time. Family is currently agreeable to discuss goals of care with palliative care. Patient would ideally benefit from discharge to hospice at SNF or home with hospice. Patient has clearly stated that she would like her symptoms to be controlled and she would want Xanax for anxiety as a priority.  Hyperkalemia - resolved  -cont to monitor.   Chronic kidney disease stage III -resolved  Right forearm laceration -Status post glue placement per EDP, No significant bleeding noted, continue Coumadin for now  Acute urinary retention. Etiology unclear probably worsening mentation. Patient had a Foley catheter which was removed 10/11/2016. On Flomax. Monitor for renal output. Bladder scan as needed.  DVT prophylaxis: Coumadin Code Status: DNR Family Communication: Family at bedside Disposition Plan:  to be decided pending discussion with family  Subjective: No acute complaint, seen without BiPAP. Feeling better but still remains short of breath and anxious. Has persistent RVR.  Objective: Vitals:   10/14/16 1000 10/14/16 1100 10/14/16 1200 10/14/16 1459  BP: (!) 105/58 (!) 117/50 (!) 92/53   Pulse: (!) 114 (!) 122 (!) 106   Resp: 19 (!) 23 (!)  22     Temp:   98.2 F (36.8 C)   TempSrc:   Oral   SpO2: 100% 94% 95% 94%  Weight:      Height:        Intake/Output Summary (Last 24 hours) at 10/14/16 1637 Last data filed at 10/14/16 1000  Gross per 24 hour  Intake              240 ml  Output                0 ml  Net              240 ml   Filed Weights   10/12/16 0521 10/13/16 0310 10/14/16 0443  Weight: 56.5 kg (124 lb 9.6 oz) 57.6 kg (126 lb 14.4 oz) 59.7 kg (131 lb 11.2 oz)    Examination:  General exam: Appears calm and comfortable. drwosy Respiratory system: Minimal air movement. Cardiovascular system: S1 & S2 heard, RRR. No JVD, murmurs, rubs, gallops or clicks. No pedal edema. Gastrointestinal system: Abdomen is nondistended, soft and nontender. No organomegaly or masses felt. Normal bowel sounds heard. Central nervous system: Alert and oriented. No focal neurological deficits. Extremities: Symmetric 5 x 5 power. Skin: No rashes, lesions or ulcers Psychiatry: Judgement and insight appear normal. Mood & affect appropriate.     Data Reviewed:   CBC:  Recent Labs Lab 10/08/16 1449 10/09/16 0525 10/11/16 0356 10/12/16 0425 10/13/16 0231 10/14/16 0310  WBC 6.2 4.9 5.4 4.6 7.1 6.9  NEUTROABS 4.3  --   --   --   --   --   HGB 9.5* 8.9* 9.9* 10.0* 10.4* 9.5*  HCT 32.4* 30.5* 32.8* 34.5* 37.4 32.8*  MCV 96.4 95.6 93.4 95.8 99.5 96.5  PLT 204 169 181 173 192 [id number]   Basic Metabolic Panel:  Recent Labs Lab 10/11/16 0356 10/12/16 0425 10/12/16 1208 10/13/16 0231 10/14/16 0310  NA 136 140 139 140 137  K 3.5 4.5 4.2 4.6 4.6  CL 82* 83* 82* 80* 79*  CO2 44* >50* 49* >50* 47*  GLUCOSE 112* 108* 112* 158* 124*  BUN 16 21* 20 33* 45*  CREATININE 0.92 0.94 0.89 1.11* 1.08*  CALCIUM 8.7* 9.0 9.2 9.6 9.4  MG 1.5* 1.9  --  2.3 3.0*   GFR: Estimated Creatinine Clearance: 32.3 mL/min (by C-G formula based on SCr of 1.08 mg/dL (H)). Liver Function Tests: No results for input(s): AST, ALT, ALKPHOS, BILITOT, PROT,  ALBUMIN in the last 168 hours. No results for input(s): LIPASE, AMYLASE in the last 168 hours. No results for input(s): AMMONIA in the last 168 hours. Coagulation Profile:  Recent Labs Lab 10/10/16 0557 10/11/16 0356 10/12/16 0425 10/13/16 0231 10/14/16 0310  INR 2.89 2.22 1.93 2.07 2.32   Cardiac Enzymes:  Recent Labs Lab 10/09/16 0851  TROPONINI <0.03   BNP (last 3 results) No results for input(s): PROBNP in the last 8760 hours. HbA1C: No results for input(s): HGBA1C in the last 72 hours. CBG: No results for input(s): GLUCAP in the last 168 hours. Lipid Profile: No results for input(s): CHOL, HDL, LDLCALC, TRIG, CHOLHDL, LDLDIRECT in the last 72 hours. Thyroid Function Tests: No results for input(s): TSH, T4TOTAL, FREET4, T3FREE, THYROIDAB in the last 72 hours. Anemia Panel: No results for input(s): VITAMINB12, FOLATE, FERRITIN, TIBC, IRON, RETICCTPCT in the last 72 hours. Sepsis Labs: No results for input(s): PROCALCITON, LATICACIDVEN in the last 168 hours.  Recent Results (from the past 240 hour(s))  MRSA  PCR Screening     Status: None   Collection Time: 10/12/16 10:22 PM  Result Value Ref Range Status   MRSA by PCR NEGATIVE NEGATIVE Final    Comment:        The GeneXpert MRSA Assay (FDA approved for NASAL specimens only), is one component of a comprehensive MRSA colonization surveillance program. It is not intended to diagnose MRSA infection nor to guide or monitor treatment for MRSA infections.          Radiology Studies: No results found.      Scheduled Meds: . budesonide  0.25 mg Nebulization BID  . diltiazem  300 mg Oral BID  . feeding supplement (ENSURE ENLIVE)  237 mL Oral TID BM  . ipratropium  0.5 mg Nebulization Q6H  . levalbuterol  0.63 mg Nebulization Q6H  . loratadine  10 mg Oral Daily  . methylPREDNISolone (SOLU-MEDROL) injection  40 mg Intravenous Q12H  . metoprolol tartrate  12.5 mg Oral BID  . sodium chloride flush  3 mL  Intravenous Q12H  . sotalol  80 mg Oral BID  . tamsulosin  0.4 mg Oral QPC supper  . warfarin  1 mg Oral ONCE-1800  . Warfarin - Pharmacist Dosing Inpatient   Does not apply q1800   Continuous Infusions:   LOS: 6 days    Time spent: 35 mins   Author:  Berle Mull, MD [facility] Pager: [phone] 10/14/2016 4:37 PM     If 7PM-7AM, please contact night-coverage [email or web address] Password [email or web address] 10/14/2016, 4:37 PM

## 2016-10-14 NOTE — Progress Notes (Signed)
Progress Note  Patient Name: Claudia [name] Date of Encounter: 10/14/2016  Primary Cardiologist: Croitrou  Subjective   Currently off BiPAP with breathing improved. Remains in atrial fibrillation with rapid rates.   Inpatient Medications    Scheduled Meds: . budesonide  0.25 mg Nebulization BID  . diltiazem  300 mg Oral BID  . feeding supplement (ENSURE ENLIVE)  237 mL Oral TID BM  . furosemide  40 mg Oral Daily  . ipratropium  0.5 mg Nebulization Q6H  . levalbuterol  0.63 mg Nebulization Q6H  . loratadine  10 mg Oral Daily  . methylPREDNISolone (SOLU-MEDROL) injection  40 mg Intravenous Q12H  . sodium chloride flush  3 mL Intravenous Q12H  . sotalol  80 mg Oral BID  . tamsulosin  0.4 mg Oral QPC supper  . Warfarin - Pharmacist Dosing Inpatient   Does not apply q1800   Continuous Infusions:  PRN Meds: sodium chloride, acetaminophen, benzocaine, metoprolol, ondansetron (ZOFRAN) IV, sodium chloride flush   Vital Signs    Vitals:   10/14/16 0356 10/14/16 0443 10/14/16 0500 10/14/16 0600  BP: (!) 102/32  97/62 (!) 96/56  Pulse: (!) 118  (!) 120 (!) 124  Resp: 17  18 16   Temp: 97.8 F (36.6 C)     TempSrc: Oral     SpO2: 96%  96% 97%  Weight:  131 lb 11.2 oz (59.7 kg)    Height:        Intake/Output Summary (Last 24 hours) at 10/14/16 0744 Last data filed at 10/13/16 2112  Gross per 24 hour  Intake                3 ml  Output                0 ml  Net                3 ml   Filed Weights   10/12/16 0521 10/13/16 0310 10/14/16 0443  Weight: 124 lb 9.6 oz (56.5 kg) 126 lb 14.4 oz (57.6 kg) 131 lb 11.2 oz (59.7 kg)    Telemetry    Sinus rhythm with episodes of atrial fibrillation - Personally Reviewed  ECG    Atrial fibrillation, intermittent V pacing - Personally Reviewed  Physical Exam   GEN: No acute distress.   Neck: No JVD Cardiac: iRRR, no murmurs, rubs, or gallops.  Respiratory: Clear to auscultation bilaterally. GI: Soft, nontender,  non-distended  MS: No edema; No deformity. Neuro:  Nonfocal  Psych: Normal affect   Labs    Chemistry  Recent Labs Lab 10/11/16 0356  10/12/16 1208 10/13/16 0231 10/14/16 0310  NA 136  < > 139 140 137  K 3.5  < > 4.2 4.6 4.6  CL 82*  < > 82* 80* 79*  CO2 44*  < > 49* >50* 47*  GLUCOSE 112*  < > 112* 158* 124*  BUN 16  < > 20 33* 45*  CREATININE 0.92  < > 0.89 1.11* 1.08*  CALCIUM 8.7*  < > 9.2 9.6 9.4  GFRNONAA 57*  < > 59* 45* 47*  GFRAA >60  < > >60 53* 54*  ANIONGAP 10  --  8  --  11  < > = values in this interval not displayed.   Hematology  Recent Labs Lab 10/12/16 0425 10/13/16 0231 10/14/16 0310  WBC 4.6 7.1 6.9  RBC 3.60* 3.76* 3.40*  HGB 10.0* 10.4* 9.5*  HCT 34.5* 37.4 32.8*  MCV 95.8 99.5 96.5  MCH 27.8 27.7 27.9  MCHC 29.0* 27.8* 29.0*  RDW 16.5* 16.9* 16.3*  PLT 173 192 186    Cardiac Enzymes  Recent Labs Lab 10/09/16 0851  TROPONINI <0.03     Recent Labs Lab 10/08/16 1500  TROPIPOC 0.00     BNP  Recent Labs Lab 10/08/16 1449  BNP 541.1*     DDimer No results for input(s): DDIMER in the last 168 hours.   Radiology    No results found.  Cardiac Studies   - Left ventricle: The cavity size was normal. Systolic function was   normal. The estimated ejection fraction was in the range of 60%   to 65%. Wall motion was normal; there were no regional wall   motion abnormalities. Left ventricular diastolic function   parameters were normal. - Aortic valve: There was mild regurgitation. - Mitral valve: Calcified annulus. - Atrial septum: No defect or patent foramen ovale was identified. - Pulmonary arteries: PA peak pressure: 48 mm Hg (S). - Pericardium, extracardiac: A trivial pericardial effusion was   identified.  Patient Profile     83 y.o. female with a h/o COPD, paroxysmal A. fib, hypertension, hyperlipidemia, tachybradycardia syndrome status post pacemaker placement who presented to the hospital with shortness of  breath.  Assessment & Plan    1. Persistent atrial fibrillation: has been in and out of AF since being admitted. It is likely that this Claudia [name] continue. Her HR is elevated but BP is currently low. I am worreid that her respiratory status is driving some of her HR issues. Claudia [name] add metoprolol 25 mg BID which may help improve her HR. She may ultimately require AV nodal ablation, but would prefer waiting until she leaves the hospital for that procedure.  2. Diastolic heart failure, acute on chronic: Appears to be improving with improving volume status at -4.8 L. would continue her oral Lasix.  3. Acute on chronic respiratory failure likely due to COPD: Respiratory status improving overnight. Off BiPAP currently, per primary team. Plan to meet with palliative today.  Signed, Claudia Cartmell Meredith Leeds, MD  10/14/2016, 7:44 AM

## 2016-10-14 NOTE — Progress Notes (Signed)
Export for Warfarin Indication: atrial fibrillation   Allergies  Allergen Reactions  . Augmentin [Amoxicillin-Pot Clavulanate] Shortness Of Breath  . Doxycycline Shortness Of Breath  . Nitrofurantoin Shortness Of Breath  . Acyclovir And Related   . Amiodarone Hcl Nausea And Vomiting  . Clarithromycin Other (See Comments)    Crazy feeling, confusion  . Clonazepam Other (See Comments)    Doesn't remember reaction  . Codeine Nausea And Vomiting  . Diltiazem Hcl Nausea And Vomiting  . Fish Oil Nausea And Vomiting  . Flecainide Nausea Only  . Multaq [Dronedarone] Nausea Only  . Rosuvastatin Other (See Comments)    Leg cramps   . Zetia [Ezetimibe] Other (See Comments)    Leg cramps    Patient Measurements: Height: 5\' 2"  (157.5 cm) Weight: 131 lb 11.2 oz (59.7 kg) IBW/kg (Calculated) : 50.1   Vital Signs: Temp: 98.2 F (36.8 C) (03/03 1200) Temp Source: Oral (03/03 1200) BP: 92/53 (03/03 1200) Pulse Rate: 106 (03/03 1200)  Labs:  Recent Labs  10/12/16 0425 10/12/16 1208 10/13/16 0231 10/14/16 0310  HGB 10.0*  --  10.4* 9.5*  HCT 34.5*  --  37.4 32.8*  PLT 173  --  192 186  LABPROT 22.3*  --  23.6* 25.9*  INR 1.93  --  2.07 2.32  CREATININE 0.94 0.89 1.11* 1.08*    Estimated Creatinine Clearance: 32.3 mL/min (by C-G formula based on SCr of 1.08 mg/dL (H)).  [name] admitted for SOB on PTA warfarin for Afib. Pharmacy consulted to dose warfarin.  INR on admission is elevated at 3.57 with last dose taken on 2/24.   PTA dose: 2 mg on MWF and 1 mg AOD's per last clinic note  INR therapeutic today at 2.32 after 2mg  daily for the last 3 days. No bleeding noted. CBC stable. Give home dose today.  Goal of Therapy:  INR 2-3 Monitor platelets by anticoagulation protocol: Yes   Plan:  Give Warfarin 1mg  tonight Daily INR Monitor s/sx of bleeding  Dierdre [name], BS, PharmD Clinical Pharmacy  Resident [phone] (Pager) 10/14/2016 12:43 PM

## 2016-10-14 NOTE — Consult Note (Signed)
Consultation Note Date: 10/14/2016   Patient Name: Claudia [name]  DOB: Oct 21, 1932  MRN: [id number]  Age / Sex: 83 y.o., female  PCP: Pcp Not In System Referring Physician: Lavina Hamman, MD  Reason for Consultation: Establishing goals of care, Hospice Evaluation, Non pain symptom management, Pain control and Psychosocial/spiritual support  HPI/Patient Profile: 83 y.o. female  with past medical history of COPD, atrial A. fib with RVR, hypertension, hyperlipidemia, tachybradycardia syndrome status post pacemaker placement in the past 2 weeks. This was complicated by a pneumothorax. She was discharged home about a week ago but was readmitted on 10/08/2016 with A. fib and was in RVR, mental status changes hypoxia and volume overload . She has persisted to be in A. fib with RVR which has been difficult to control as her blood pressure has remained low. Not only is she dealing with hypoxia she is retaining CO2. She has used BiPAP while in the hospital but tells her husband, her daughter as well as myself that she doesn't wish to use that going forward. We talked at length about the disease process related to COPD heart failure fluid overload. She is less volume overloaded than upon admission but she is still dealing with some of this she continues on Lasix.   Clinical Assessment and Goals of Care: Patient shares that "she is tired". She feels that she will be dying soon and is prepared. we discussed two possible tracks to pursue, the first being symptom and comfort focused which would include no further BiPAP CPAP medications for comfort such as Xanax and morphine. Patient has had very high CO2 levels even without any sedating drugs on board but was very sensitive to Xanax and had a CO2 level of 84.7. The other tract we discussed his home with hospice but they feel that they cannot manage this as her husband states he is too  frail to be able to care for her at this point. She is bedbound. Patient herself does not feel that she is capable of rehabilitation and would like to avoid dying in nursing home I did prepare them that for a comfort tract and no longer using BiPAP in treating symptoms, she would become sedated retaining CO2 and could potentially have a prognosis of just weeks to live. Her daughter becomes tearful at this point as does her husband; patient states she herself is ready.  Patient is able to speak for herself at this point but I do not anticipate that she will be able to do this for very long. Her husband Shanon Brow, and daughter Jackelyn Poling are her surrogate decision makers    SUMMARY OF RECOMMENDATIONS   DO NOT RESUSCITATE DO NOT INTUBATE continue with BiPAP while in the hospital We will be meeting with family again on 10/15/2016 to solidify plans of either facility with hospice or inpatient hospice ode Status/Advance Care Planning:  DNR    Symptom Management:    Dyspnea; continue with targeted pulmonary treatments as ordered patient would be a good candidate for low-dose opioids and  would recommend morphine concentrate at 2.5 mg to 5 mg every 4 hours as needed. She is opioid nave   Anxiety: Patient has associated anxiety with dyspnea. Continue with Xanax 0.25 every 6 when necessary Palliative Prophylaxis:   Aspiration, Bowel Regimen, Delirium Protocol, Frequent Pain Assessment, Oral Care and Turn Reposition  Additional Recommendations (Limitations, Scope, Preferences):  No Artificial Feeding, No Blood Transfusions, No Chemotherapy, No Hemodialysis, No Lab Draws, No Radiation, No Surgical Procedures and No Tracheostomy  Psycho-social/Spiritual:   Desire for further Chaplaincy support:no  Additional Recommendations: Grief/Bereavement Support  Prognosis:   < 4 weeks in the setting of markedly fluid overload, intolerance to Lasix, CO2 retention and desire for no further BiPAP and for better  symptom control. Patient is aware that these medications could make her sleepy or and does cause her to retaining more CO2 and she could die sooner. She reports that she is ready to read family in agreement with the plan   Discharge Planning: inpatient hospice versus a skilled nursing facility with hospice. I am meeting with the family again on 10/15/2016 at 10:00.     Primary Diagnoses: Present on Admission: . Acute on chronic respiratory failure ([place]) . ATRIAL FLUTTER, PAROXYSMAL . Cardiac pacemaker in situ . Chronic atrial fibrillation ([place]) . HTN (hypertension) . Hyperlipidemia . Tachy-brady syndrome ([place])   I have reviewed the medical record, interviewed the patient and family, and examined the patient. The following aspects are pertinent.  Past Medical History:  Diagnosis Date  . Abdominal pain, other specified site    [place] - negative abdominal ultrasound  . Abnormal chest x-ray   . Chest pain 01/19/2009   myoview stress - LVEF >75% no reversible ischemia or infarction  . COPD (chronic obstructive pulmonary disease) ([place])   . Dyslipidemia   . Hypertension   . Paroxysmal atrial flutter ([place]) 01/19/2009   echo - EF 0000000; grade 1 diastolic dysfunction; mitral valve- calcified annlus, mildly dilated R ventricle and atrium  . Pneumonia    hx  . PONV (postoperative nausea and vomiting)   . Tremor, essential 09/07/2015   Social History   Social History  . Marital status: Married    Spouse name: Shanon Brow  . Number of children: 3  . Years of education: 12   Occupational History  . retired    Social History Main Topics  . Smoking status: Former Smoker    Packs/day: 2.00    Years: 25.00    Types: Cigarettes    Quit date: 08/15/1987  . Smokeless tobacco: Never Used  . Alcohol use No  . Drug use: No  . Sexual activity: Not Asked   Other Topics Concern  . None   Social History Narrative   Patient lives with her husband Shanon Brow).   Patient has 3 children.   Patient is  right-handed.   Patient drinks 1 16 oz of Coke daily.   Patient is retired.   Patient has a high school education.   Family History  Problem Relation Age of Onset  . Bone cancer Mother   . Cancer Mother     bone  . Bone cancer Father   . Lung cancer Father   . Cancer Father     Lung  . Hyperlipidemia Father   . Hypertension Father   . Hypertension Sister   . Cancer Sister     breast  . Hyperlipidemia Sister   . Hyperlipidemia Sister   . Alzheimer's disease Sister   . Hypertension Sister   .  Hyperlipidemia Brother   . Hypertension Brother   . Breast cancer Sister    Scheduled Meds: . budesonide  0.25 mg Nebulization BID  . diltiazem  300 mg Oral BID  . feeding supplement (ENSURE ENLIVE)  237 mL Oral TID BM  . ipratropium  0.5 mg Nebulization Q6H  . levalbuterol  0.63 mg Nebulization Q6H  . loratadine  10 mg Oral Daily  . methylPREDNISolone (SOLU-MEDROL) injection  40 mg Intravenous Q12H  . metoprolol tartrate  12.5 mg Oral BID  . sodium chloride flush  3 mL Intravenous Q12H  . sotalol  80 mg Oral BID  . tamsulosin  0.4 mg Oral QPC supper  . Warfarin - Pharmacist Dosing Inpatient   Does not apply q1800   Continuous Infusions: PRN Meds:.sodium chloride, acetaminophen, benzocaine, metoprolol, ondansetron (ZOFRAN) IV, sodium chloride flush Medications Prior to Admission:  Prior to Admission medications   Medication Sig Start Date End Date Taking? Authorizing Provider  acetaminophen (TYLENOL) 325 MG tablet Take 650 mg by mouth every 6 (six) hours as needed for pain.   Yes Historical Provider, MD  albuterol (PROAIR HFA) 108 (90 Base) MCG/ACT inhaler INHALE 2 PUFFS EVERY 6 HOURS AS NEEDED FOR WHEEZING OR SHORTNESS OF BREATH 01/04/16  Yes Deneise Lever, MD  ALPRAZolam Duanne Moron) 0.25 MG tablet TAKE 1 TABLET BY MOUTH THREE TIMES DAILY AS NEEDED 06/12/16  Yes Kathrynn Ducking, MD  arformoterol (BROVANA) 15 MCG/2ML NEBU Take 2 mLs (15 mcg total) by nebulization 2 (two) times daily.  09/30/16  Yes Dron Tanna Furry, MD  budesonide (PULMICORT) 0.25 MG/2ML nebulizer solution Take 2 mLs (0.25 mg total) by nebulization 2 (two) times daily. 09/30/16  Yes Dron Tanna Furry, MD  carboxymethylcellulose (REFRESH PLUS) 0.5 % SOLN Place 1-2 drops into both eyes 2 (two) times daily.   Yes Historical Provider, MD  diltiazem (CARDIZEM CD) 300 MG 24 hr capsule Take 1 capsule (300 mg total) by mouth 2 (two) times daily. 09/30/16  Yes Dron Tanna Furry, MD  loratadine (CLARITIN) 10 MG tablet Take 1 tablet (10 mg total) by mouth daily. 10/01/16  Yes Dron Tanna Furry, MD  metoprolol tartrate (LOPRESSOR) 25 MG tablet Take 1 tablet (25 mg total) by mouth every 6 (six) hours. Patient taking differently: Take 25 mg by mouth every 6 (six) hours. Take 4 times daily per patient and family 09/30/16  Yes Dron Tanna Furry, MD  OXYGEN Inhale 2 L into the lungs. Use @@ Bedtime   Yes Historical Provider, MD  potassium chloride SA (K-DUR,KLOR-CON) 20 MEQ tablet Take 1 tablet (20 mEq total) by mouth daily. 10/01/16  Yes Dron Tanna Furry, MD  sotalol (BETAPACE) 80 MG tablet Take 1 tablet (80 mg total) by mouth 2 (two) times daily. 09/30/16  Yes Dron Tanna Furry, MD  tiotropium (SPIRIVA HANDIHALER) 18 MCG inhalation capsule 1 inhale daily Patient taking differently: Place 18 mcg into inhaler and inhale daily.  01/04/16  Yes Deneise Lever, MD  warfarin (COUMADIN) 1 MG tablet Take 1 mg by mouth daily.   Yes Historical Provider, MD  benzocaine (ORAJEL) 10 % mucosal gel Use as directed in the mouth or throat 4 (four) times daily as needed for mouth pain (to gums prn). Patient not taking: Reported on 10/08/2016 09/30/16   Dron Tanna Furry, MD  warfarin (COUMADIN) 1 MG tablet TAKE 1 TO 2 TABLETS BY MOUTH EVERY DAY AS DIRECTED BY COUMADIN CLINIC Patient not taking: Reported on 10/08/2016 04/11/16   Sanda Klein, MD  Allergies  Allergen Reactions  . Augmentin [Amoxicillin-Pot Clavulanate]  Shortness Of Breath  . Doxycycline Shortness Of Breath  . Nitrofurantoin Shortness Of Breath  . Acyclovir And Related   . Amiodarone Hcl Nausea And Vomiting  . Clarithromycin Other (See Comments)    Crazy feeling, confusion  . Clonazepam Other (See Comments)    Doesn't remember reaction  . Codeine Nausea And Vomiting  . Diltiazem Hcl Nausea And Vomiting  . Fish Oil Nausea And Vomiting  . Flecainide Nausea Only  . Multaq [Dronedarone] Nausea Only  . Rosuvastatin Other (See Comments)    Leg cramps   . Zetia [Ezetimibe] Other (See Comments)    Leg cramps   Review of Systems  Unable to perform ROS: Mental status change    Physical Exam  Constitutional:  Cachetic frail elderly female; somnalent  Cardiovascular:  Irrg, tachy  Pulmonary/Chest:  Increased work of breathing  Abdominal: Soft.  Neurological:  somnalent at times but able to participate in discussion   Skin:  Cool pale  Psychiatric:  Affect constricted  Nursing note and vitals reviewed.   Vital Signs: BP (!) 105/58   Pulse (!) 114   Temp 98.2 F (36.8 C) (Oral)   Resp 19   Ht 5\' 2"  (1.575 m)   Wt 59.7 kg (131 lb 11.2 oz)   SpO2 100%   BMI 24.09 kg/m  Pain Assessment: No/denies pain POSS *See Group Information*: S-Acceptable,Sleep, easy to arouse Pain Score: 0-No pain   SpO2: SpO2: 100 % O2 Device:SpO2: 100 % O2 Flow Rate: .O2 Flow Rate (L/min): 4 L/min  IO: Intake/output summary:  Intake/Output Summary (Last 24 hours) at 10/14/16 1134 Last data filed at 10/14/16 1000  Gross per 24 hour  Intake              240 ml  Output                0 ml  Net              240 ml    LBM: Last BM Date: 10/11/16 Baseline Weight: Weight: 59 kg (130 lb) Most recent weight: Weight: 59.7 kg (131 lb 11.2 oz)     Palliative Assessment/Data:   Flowsheet Rows   Flowsheet Row Most Recent Value  Intake Tab  Referral Department  Hospitalist  Unit at Time of Referral  Intermediate Care Unit  Palliative Care  Primary Diagnosis  Pulmonary  Date Notified  10/13/16  Palliative Care Type  New Palliative care  Reason for referral  Non-pain Symptom, Pain, Clarify Goals of Care, Counsel Regarding Hospice  Date of Admission  10/08/16  Date first seen by Palliative Care  10/14/16  # of days Palliative referral response time  1 Day(s)  # of days IP prior to Palliative referral  5  Clinical Assessment  Palliative Performance Scale Score  30%  Pain Max last 24 hours  Not able to report  Pain Min Last 24 hours  Not able to report  Dyspnea Max Last 24 Hours  Not able to report  Dyspnea Min Last 24 hours  Not able to report  Nausea Max Last 24 Hours  Not able to report  Nausea Min Last 24 Hours  Not able to report  Anxiety Max Last 24 Hours  Not able to report  Anxiety Min Last 24 Hours  Not able to report  Other Max Last 24 Hours  Not able to report  Psychosocial & Spiritual Assessment  Palliative Care Outcomes  Patient/Family meeting held?  Yes  Who was at the meeting?  daughter and husband  Palliative Care Outcomes  Clarified goals of care, Provided end of life care assistance, Provided psychosocial or spiritual support, Transitioned to hospice, Changed to focus on comfort, Counseled regarding hospice  Patient/Family wishes: Interventions discontinued/not started   Mechanical Ventilation, Tube feedings/TPN, Hemodialysis, Transfusion, NIPPV, Vasopressors, Trach, PEG  Palliative Care follow-up planned  Yes, Facility      Time In: 1000 Time Out: 1120 Time Total: 8o min Greater than 50%  of this time was spent counseling and coordinating care related to the above assessment and plan. Staffed with Dr. Posey Pronto  Signed by: Dory Horn, NP   Please contact Palliative Medicine Team phone at [phone] for questions and concerns.  For individual provider: See Shea Evans

## 2016-10-15 LAB — PROTIME-INR
INR: 2.82
PROTHROMBIN TIME: 30.2 s — AB (ref 11.4–15.2)

## 2016-10-15 MED ORDER — MORPHINE SULFATE (PF) 2 MG/ML IV SOLN: 2.0000 mg | INTRAVENOUS | 0 refills | Status: AC | PRN

## 2016-10-15 MED ORDER — MORPHINE SULFATE (CONCENTRATE) 10 MG/0.5ML PO SOLN: 5.0000 mg | ORAL | Status: AC | PRN

## 2016-10-15 MED ORDER — LEVALBUTEROL HCL 0.63 MG/3ML IN NEBU
0.6300 mg | INHALATION_SOLUTION | Freq: Four times a day (QID) | RESPIRATORY_TRACT | Status: DC
  Administered 2016-10-15: 0.63 mg via RESPIRATORY_TRACT
  Filled 2016-10-15: qty 3

## 2016-10-15 MED ORDER — LORAZEPAM 2 MG/ML IJ SOLN: 1.0000 mg | INTRAMUSCULAR | Status: DC | PRN

## 2016-10-15 MED ORDER — ATROPINE SULFATE 1 % OP SOLN: 4.0000 [drp] | OPHTHALMIC | 12 refills | Status: AC | PRN

## 2016-10-15 MED ORDER — ATROPINE SULFATE 1 % OP SOLN
4.0000 [drp] | OPHTHALMIC | Status: DC | PRN
  Filled 2016-10-15: qty 2

## 2016-10-15 MED ORDER — LEVALBUTEROL HCL 0.63 MG/3ML IN NEBU: 0.6300 mg | INHALATION_SOLUTION | Freq: Two times a day (BID) | RESPIRATORY_TRACT | 12 refills | Status: AC

## 2016-10-15 MED ORDER — ONDANSETRON HCL 4 MG/2ML IJ SOLN: 4.0000 mg | Freq: Four times a day (QID) | INTRAMUSCULAR | Status: DC | PRN

## 2016-10-15 MED ORDER — MORPHINE SULFATE (PF) 2 MG/ML IV SOLN: 1.0000 mg | INTRAVENOUS | Status: DC | PRN

## 2016-10-15 MED ORDER — ACETAMINOPHEN 650 MG RE SUPP: 650.0000 mg | Freq: Four times a day (QID) | RECTAL | Status: DC | PRN

## 2016-10-15 MED ORDER — ACETAMINOPHEN 325 MG PO TABS: 650.0000 mg | ORAL_TABLET | Freq: Four times a day (QID) | ORAL | Status: DC | PRN

## 2016-10-15 MED ORDER — ONDANSETRON 4 MG PO TBDP
4.0000 mg | ORAL_TABLET | Freq: Four times a day (QID) | ORAL | Status: DC | PRN
  Filled 2016-10-15: qty 1

## 2016-10-15 MED ORDER — WARFARIN SODIUM 1 MG PO TABS: 1.0000 mg | ORAL_TABLET | Freq: Once | ORAL | Status: DC

## 2016-10-15 MED ORDER — IPRATROPIUM BROMIDE 0.02 % IN SOLN
0.5000 mg | Freq: Four times a day (QID) | RESPIRATORY_TRACT | Status: DC
  Administered 2016-10-15: 0.5 mg via RESPIRATORY_TRACT
  Filled 2016-10-15: qty 2.5

## 2016-10-15 MED ORDER — POLYVINYL ALCOHOL 1.4 % OP SOLN: 1.0000 [drp] | Freq: Four times a day (QID) | OPHTHALMIC | Status: DC | PRN

## 2016-10-15 MED ORDER — NYSTATIN 100000 UNIT/GM EX POWD: Freq: Three times a day (TID) | CUTANEOUS | 0 refills | Status: AC | PRN

## 2016-10-15 MED ORDER — MORPHINE SULFATE (PF) 2 MG/ML IV SOLN
2.0000 mg | INTRAVENOUS | Status: DC | PRN
Start: 2016-10-15 — End: 2016-10-15

## 2016-10-15 MED ORDER — NYSTATIN 100000 UNIT/GM EX POWD: Freq: Three times a day (TID) | CUTANEOUS | Status: DC | PRN

## 2016-10-15 MED ORDER — IPRATROPIUM BROMIDE 0.02 % IN SOLN: 0.5000 mg | Freq: Two times a day (BID) | RESPIRATORY_TRACT | 12 refills | Status: AC

## 2016-10-15 MED ORDER — IPRATROPIUM BROMIDE 0.02 % IN SOLN: 0.5000 mg | Freq: Two times a day (BID) | RESPIRATORY_TRACT | Status: DC

## 2016-10-15 MED ORDER — ENSURE ENLIVE PO LIQD: 237.0000 mL | Freq: Three times a day (TID) | ORAL | 12 refills | Status: AC

## 2016-10-15 MED ORDER — MORPHINE SULFATE (CONCENTRATE) 10 MG/0.5ML PO SOLN: 5.0000 mg | ORAL | Status: DC | PRN

## 2016-10-15 MED ORDER — LEVALBUTEROL HCL 0.63 MG/3ML IN NEBU: 0.6300 mg | INHALATION_SOLUTION | Freq: Two times a day (BID) | RESPIRATORY_TRACT | Status: DC

## 2016-10-15 MED ORDER — ALPRAZOLAM 0.25 MG PO TABS: 0.2500 mg | ORAL_TABLET | Freq: Three times a day (TID) | ORAL | Status: DC | PRN

## 2016-10-15 NOTE — Progress Notes (Signed)
Daily Progress Note   Patient Name: Claudia [name]       Date: 10/15/2016 DOB: May 17, 1933  Age: 83 y.o. MRN#: [id number] Attending Physician: Lavina Hamman, MD Primary Care Physician: Pcp Not In System Admit Date: 10/08/2016  Reason for Consultation/Follow-up: Inpatient hospice referral, Non pain symptom management and Pain control  Subjective: Patient last night had an episode of confusion where she pulled out her IV, pulled off her cardiac leads. Her concentration seems poor this morning and slightly confused. Her husband and daughter at the bedside and verbalizing readiness to move to [facility]. for end-of-life care. Patient refused BiPAP last night and does not wish to continue with this  Length of Stay: 7  Current Medications: Scheduled Meds:  . budesonide  0.25 mg Nebulization BID  . diltiazem  300 mg Oral BID  . feeding supplement (ENSURE ENLIVE)  237 mL Oral TID BM  . ipratropium  0.5 mg Nebulization BID  . levalbuterol  0.63 mg Nebulization BID  . loratadine  10 mg Oral Daily  . methylPREDNISolone (SOLU-MEDROL) injection  40 mg Intravenous Q12H  . sodium chloride flush  3 mL Intravenous Q12H  . sotalol  80 mg Oral BID  . tamsulosin  0.4 mg Oral QPC supper    Continuous Infusions:   PRN Meds: sodium chloride, acetaminophen **OR** acetaminophen, ALPRAZolam, atropine, benzocaine, LORazepam, metoprolol, morphine injection, morphine CONCENTRATE **OR** morphine CONCENTRATE, nystatin, ondansetron (ZOFRAN) IV, ondansetron **OR** ondansetron (ZOFRAN) IV, polyvinyl alcohol, sodium chloride flush  Physical Exam  Constitutional:  Acutely ill appearing female, confused and restless  HENT:  Head: Normocephalic and atraumatic.  Eyes: Pupils are equal, round, and  reactive to light.  Neck: Normal range of motion.  Cardiovascular:  Irregular  Pulmonary/Chest:  Increased work of breathing  Abdominal: Soft.  Musculoskeletal: Normal range of motion.  Very weak. Not getting out of bed  Neurological:  She is alert but more confused than yesterday  Skin:  Cool No mottling noted  Psychiatric:  Concentration poor; verbalizing anxiety; appears restless  Nursing note and vitals reviewed.           Vital Signs: BP 97/61   Pulse (!) 118   Temp 97.1 F (36.2 C) (Axillary)   Resp 18   Ht 5\' 2"  (1.575 m)   Wt 58.6  kg (129 lb 3.2 oz)   SpO2 93%   BMI 23.63 kg/m  SpO2: SpO2: 93 % O2 Device: O2 Device: Nasal Cannula O2 Flow Rate: O2 Flow Rate (L/min): 3 L/min  Intake/output summary:  Intake/Output Summary (Last 24 hours) at 10/15/16 1132 Last data filed at 10/15/16 0545  Gross per 24 hour  Intake                3 ml  Output              120 ml  Net             -117 ml   LBM: Last BM Date: 11/08/16 Baseline Weight: Weight: 59 kg (130 lb) Most recent weight: Weight: 58.6 kg (129 lb 3.2 oz)       Palliative Assessment/Data:    Flowsheet Rows   Flowsheet Row Most Recent Value  Intake Tab  Referral Department  Hospitalist  Unit at Time of Referral  Intermediate Care Unit  Palliative Care Primary Diagnosis  Pulmonary  Date Notified  10/13/16  Palliative Care Type  New Palliative care  Reason for referral  Non-pain Symptom, Pain, Clarify Goals of Care, Counsel Regarding Hospice  Date of Admission  10/08/16  Date first seen by Palliative Care  10/14/16  # of days Palliative referral response time  1 Day(s)  # of days IP prior to Palliative referral  5  Clinical Assessment  Palliative Performance Scale Score  30%  Pain Max last 24 hours  Not able to report  Pain Min Last 24 hours  Not able to report  Dyspnea Max Last 24 Hours  Not able to report  Dyspnea Min Last 24 hours  Not able to report  Nausea Max Last 24 Hours  Not able to  report  Nausea Min Last 24 Hours  Not able to report  Anxiety Max Last 24 Hours  Not able to report  Anxiety Min Last 24 Hours  Not able to report  Other Max Last 24 Hours  Not able to report  Psychosocial & Spiritual Assessment  Palliative Care Outcomes  Patient/Family meeting held?  Yes  Who was at the meeting?  daughter and husband  Palliative Care Outcomes  Clarified goals of care, Provided end of life care assistance, Provided psychosocial or spiritual support, Transitioned to hospice, Changed to focus on comfort, Counseled regarding hospice  Patient/Family wishes: Interventions discontinued/not started   Mechanical Ventilation, Tube feedings/TPN, Hemodialysis, Transfusion, NIPPV, Vasopressors, Trach, PEG  Palliative Care follow-up planned  Yes, Facility      Patient Active Problem List   Diagnosis Date Noted  . Palliative care encounter   . CHF (congestive heart failure) ([place]) 10/08/2016  . CKD (chronic kidney disease), stage III 10/08/2016  . Oral herpes simplex infection   . Oral thrush 09/23/2016  . Leukocytosis 09/21/2016  . Cardiac pacemaker in situ   . S/P chest tube placement ([place])   . Pneumothorax, traumatic   . Acute delirium   . Acute respiratory failure with hypoxemia ([place])   . Chronic atrial fibrillation ([place])   . Hypoxia   . Acute respiratory distress   . Tachy-brady syndrome ([place])   . COPD exacerbation ([place]) 09/09/2016  . Chronic diastolic CHF (congestive heart failure) ([place]) 09/09/2016  . COPD with acute exacerbation ([place]) 09/09/2016  . Tremor, essential 09/07/2015  . Acute on chronic respiratory failure ([place]) 07/06/2015  . Essential and other specified forms of tremor 06/17/2013  . Disturbance of skin sensation 06/17/2013  .  Paroxysmal atrial fibrillation ([place]) 02/01/2013  . HTN (hypertension) 02/01/2013  . Hyperlipidemia 02/01/2013  . Long term current use of anticoagulant therapy 10/30/2012  . Ductal hyperplasia, atypical, breast 09/24/2012  .  Dysfunction of eustachian tube 10/16/2011  . SOB (shortness of breath) 05/30/2010  . RHINITIS 05/23/2010  . ATRIAL FLUTTER, PAROXYSMAL 05/24/2007  . COPD with asthma ([place]) 05/24/2007  . Nonspecific (abnormal) findings on radiological and other examination of body structure 05/24/2007    Palliative Care Assessment & Plan   Patient Profile: 83 y.o. female  with past medical history of COPD, atrial A. fib with RVR, hypertension, hyperlipidemia, tachybradycardia syndrome status post pacemaker placement in the past 2 weeks. This was complicated by a pneumothorax. She was discharged home about a week ago but was readmitted on 10/08/2016 with A. fib and was in RVR, mental status changes hypoxia and volume overload . She has persisted to be in A. fib with RVR which has been difficult to control as her blood pressure has remained low. Not only is she dealing with hypoxia she is retaining CO2. She has used BiPAP while in the hospital but tells her husband, her daughter as well as myself that she doesn't wish to use that going forward. We talked at length about the disease process related to COPD heart failure fluid overload. She is less volume overloaded than upon admission but she is still dealing with some of this she continues on Lasix.   Assessment: Spoke with patient, husband, daughter about transfer to hospice [facility] today, all are in agreement. They verbalized readiness to see her more comfortable in terms of her anxiety and dyspnea. They do understand that once medication is started that she will become somnolent, retain carbon dioxide. Notified [facility] as well as social work  Recommendations/Plan:  Nurse, learning disability to hospice [facility] today. [facility] liaison has been notified and is planning to meet the family at 52 PM.    Goals of Care and Additional Recommendations:  Limitations on Scope of Treatment: Minimize Medications, Initiate  Comfort Feeding, No Artificial Feeding, No Blood Transfusions, No Chemotherapy, No Diagnostics, No Glucose Monitoring, No Hemodialysis, No Radiation, No Surgical Procedures and No Tracheostomy  Code Status:    Code Status Orders        Start     Ordered   10/15/16 1017  Do not attempt resuscitation (DNR)  Continuous    Question Answer Comment  In the event of cardiac or respiratory ARREST Do not call a "code blue"   In the event of cardiac or respiratory ARREST Do not perform Intubation, CPR, defibrillation or ACLS   In the event of cardiac or respiratory ARREST Use medication by any route, position, wound care, and other measures to relive pain and suffering. May use oxygen, suction and manual treatment of airway obstruction as needed for comfort.      10/15/16 1020    Code Status History    Date Active Date Inactive Code Status Order ID Comments User Context   10/08/2016  4:50 PM 10/15/2016 10:20 AM DNR [id number]  Caren Griffins, MD ED   09/14/2016  9:00 AM 09/30/2016  3:46 PM DNR [id number]  Baldwin Jamaica, PA-C Inpatient   09/13/2016  1:24 PM 09/14/2016  9:00 AM DNR [id number]  Rush Farmer, MD Inpatient   09/09/2016  9:54 PM 09/13/2016  1:24 PM Full Code [id number]  Vianne Bulls, MD Inpatient  Advance Directive Documentation   Flowsheet Row Most Recent Value  Type of Advance Directive  Healthcare Power of Attorney, Living will  Pre-existing out of facility DNR order (yellow form or pink MOST form)  No data  "MOST" Form in Place?  No data       Prognosis:   < 2 weeks in the setting of end-stage COPD, hypercapnic respiratory failure, acute on chronic diastolic heart failure with fluid overload, atrial flutter with tachycardia bradycardia syndrome status post pacemaker (no AICD), chronic kidney disease stage III  Discharge Planning:  Hospice facility  Care plan was discussed with Dr. Posey Pronto  Thank you for allowing the Palliative Medicine Team to assist in the care of this  patient.   Time In: 1000 Time Out: 1035 Total Time 35 min Prolonged Time Billed  no       Greater than 50%  of this time was spent counseling and coordinating care related to the above assessment and plan.  Dory Horn, NP  Please contact Palliative Medicine Team phone at [phone] for questions and concerns.

## 2016-10-15 NOTE — Progress Notes (Signed)
[place] for Warfarin Indication: atrial fibrillation   Allergies  Allergen Reactions  . Augmentin [Amoxicillin-Pot Clavulanate] Shortness Of Breath  . Doxycycline Shortness Of Breath  . Nitrofurantoin Shortness Of Breath  . Acyclovir And Related   . Amiodarone Hcl Nausea And Vomiting  . Clarithromycin Other (See Comments)    Crazy feeling, confusion  . Clonazepam Other (See Comments)    Doesn't remember reaction  . Codeine Nausea And Vomiting  . Diltiazem Hcl Nausea And Vomiting  . Fish Oil Nausea And Vomiting  . Flecainide Nausea Only  . Multaq [Dronedarone] Nausea Only  . Rosuvastatin Other (See Comments)    Leg cramps   . Zetia [Ezetimibe] Other (See Comments)    Leg cramps    Patient Measurements: Height: 5\' 2"  (157.5 cm) Weight: 129 lb 3.2 oz (58.6 kg) IBW/kg (Calculated) : 50.1   Vital Signs: Temp: 97.1 F (36.2 C) (03/04 0800) Temp Source: Axillary (03/04 0800) BP: 97/61 (03/04 0934) Pulse Rate: 118 (03/04 0900)  Labs:  Recent Labs  10/12/16 1208 10/13/16 0231 10/14/16 0310 10/15/16 0300  HGB  --  10.4* 9.5*  --   HCT  --  37.4 32.8*  --   PLT  --  192 186  --   LABPROT  --  23.6* 25.9* 30.2*  INR  --  2.07 2.32 2.82  CREATININE 0.89 1.11* 1.08*  --     Estimated Creatinine Clearance: 32.3 mL/min (by C-G formula based on SCr of 1.08 mg/dL (H)).  [name] admitted for SOB on PTA warfarin for Afib. Pharmacy consulted to dose warfarin.  INR on admission is elevated at 3.57 with last dose taken on 2/24.   PTA dose: 2 mg on MWF and 1 mg AOD's per last clinic note  INR therapeutic today at 2.82 after 1mg  last night. No bleeding per nurse. Of note, patient is going comfort care, but now goals of care placed yet. Repeat yesterday's dose tonight.   Goal of Therapy:  INR 2-3 Monitor platelets by anticoagulation protocol: Yes   Plan:  Give Warfarin 1mg  tonight Daily INR Monitor s/sx of  bleeding Follow up comfort care [place], BS, PharmD Clinical Pharmacy Resident [phone] (Pager) 10/15/2016 9:39 AM

## 2016-10-15 NOTE — Discharge Summary (Signed)
[facility] Discharge Summary   Patient: Claudia [name] [id number]   PCP: Pcp Not In System DOB: Dec 30, 1932   Date of admission: 10/08/2016   Date of discharge:  10/15/2016    Discharge Diagnoses:  Active Problems:   ATRIAL FLUTTER, PAROXYSMAL   Long term current use of anticoagulant therapy   HTN (hypertension)   Hyperlipidemia   Acute on chronic respiratory failure (HCC)   Tachy-brady syndrome (HCC)   Chronic atrial fibrillation (HCC)   Cardiac pacemaker in situ   CHF (congestive heart failure) (HCC)   CKD (chronic kidney disease), stage III   Palliative care encounter   Admitted From: home Disposition:  Inpatient hospice  Recommendations for Outpatient Follow-up:   Diet recommendation: comfort feeds  Activity: The patient is advised to gradually reintroduce usual activities.  Discharge Condition: fair  Code Status: DNR DNI inpatient hospice  History of present illness: As per the H and P dictated on admission, " Claudia [name] is a 83 y.o. female with medical history significant of COPD, paroxysmal A. fib, hypertension, hyperlipidemia, tachybradycardia syndrome status post pacemaker placement in the last 2 weeks, was discharged home about a week ago from a prolonged hospitalization, requiring PPM placement which was complicated by pneumothorax, COPD exacerbation, A. fib with RVR, steroid-induced delirium, and acute kidney injury.  For full hospital course, refer to the discharge summary on 09/30/2016.  On discharge, she felt well, she went home and had couple good days, however in the last 3-4 days she has noticed progressively worsening shortness of breath with minimal activity, as well as progressive bilateral lower extremity swelling, which is new.  She also is complaining of generalized weakness, and had a fall today and the laceration of the right forearm.  He denies passing out, and thinks she tripped on something but she is not sure.  She denies any fever or chills,  she has no cough or chest congestion.  She denies any sputum production.  She has no abdominal pain, no nausea, vomiting or diarrhea.  She denies any numbness or focal weakness."  Hospital Course:   Summary of her active problems in the hospital is as following. Acute on chronic diastolic CHF - class III Severe metabolic alkalosis. -Patient with clinical evidence of significant fluid overload with pitting lower extremity edema,crackles and JVD. Most recent 2D echo done on 09/13/2016 showed normal EF of 60-65%, and grade 2 diastolic dysfunction. -repeat echo not much different than the one from 4 weeks ago.  -supplemental O2 as needed, wean as appropriate.  -CTA chest is neg for PE but showed b/l centrilobular emphysema and mod bilateral pleural effusions and adjacent atelectasis.  -cardiology consulted. Currently due to hypotension I am not continuing Lasix at present monitor and use when necessary.  Acute on chronic hypoxic and hypercarbic respiratory failure -Patient with increased work of breathing. ABG shows mixed respiratory acidosis as well as metabolic alkalosis. Patient neurologically intake. Patient does not prefer the use of BiPAP but tolerated it well overnight. Without the BiPAP patient is lethargic. Patient is DO NOT RESUSCITATE. Discussed with pulmonary, patient on scheduled nebs. See goals of care discussion  Paroxysmal A. Flutter with tachybradycardia syndrome status post pacemaker placement- now back in A fib  -EKG shows underlying atrial flutter, with intermittent conduction as well as paced rhythm - Cardiology consulted, given low blood pressure on discontinuing metoprolol, continue Cardizem and Betapace.  Severe COPD  goals of care discussion -Patient has very minimal air movement. History of steroid-induced delirium. Has  developed acute on chronic hypercarbic respiratory failure. Likely from Xanax. Discussed with patient's daughter regarding her  prognosis. Patient appears to have end-stage COPD with recurrent admission for exacerbation. At present I'm concerned that controlling her symptoms for anxiety and air hunger are difficult due to worsening of hypercarbia and patient is unable to tolerate BiPAP for prolonged time. Family is currently agreeable for inpatient hospice. Patient has clearly stated that she would like her symptoms to be controlled and she would want Xanax for anxiety as a priority.  Hyperkalemia - resolved   Chronic kidney disease stage III -stable  Right forearm laceration -Status post glue placement per EDP, No significant bleeding noted  Acute urinary retention. Etiology unclear probably worsening mentation. Patient had a Foley catheter which was removed 10/11/2016. Monitor for renal output. Bladder scan as needed.  All other chronic medical condition were stable during the hospitalization.  Inpatient hospice was arranged by social worker and case Freight forwarder. On the day of the discharge the patient's symptoms were stable, and no other acute medical condition were reported by patient. the patient was felt safe to be discharge at inpatient hospice  Procedures and Results:  bipap   Consultations:  Electrophysiology   Palliative care  DISCHARGE MEDICATION: Current Discharge Medication List    START taking these medications   Details  atropine 1 % ophthalmic solution Place 4 drops under the tongue every 4 (four) hours as needed (excessive secretions). Qty: 2 mL, Refills: 12    feeding supplement, ENSURE ENLIVE, (ENSURE ENLIVE) LIQD Take 237 mLs by mouth 3 (three) times daily between meals. Qty: 237 mL, Refills: 12    ipratropium (ATROVENT) 0.02 % nebulizer solution Take 2.5 mLs (0.5 mg total) by nebulization 2 (two) times daily. Qty: 75 mL, Refills: 12    levalbuterol (XOPENEX) 0.63 MG/3ML nebulizer solution Take 3 mLs (0.63 mg total) by nebulization 2 (two) times daily. Qty: 3 mL, Refills: 12     morphine 2 MG/ML injection Inject 1-2 mLs (2-4 mg total) into the vein every 2 (two) hours as needed (or dyspnea). Qty: 1 mL, Refills: 0    Morphine Sulfate (MORPHINE CONCENTRATE) 10 MG/0.5ML SOLN concentrated solution Take 0.25 mLs (5 mg total) by mouth every 2 (two) hours as needed for moderate pain (or dyspnea). Qty: 180 mL    nystatin (MYCOSTATIN/NYSTOP) powder Apply topically 3 (three) times daily as needed (affected skin). Qty: 15 g, Refills: 0      CONTINUE these medications which have NOT CHANGED   Details  acetaminophen (TYLENOL) 325 MG tablet Take 650 mg by mouth every 6 (six) hours as needed for pain.    albuterol (PROAIR HFA) 108 (90 Base) MCG/ACT inhaler INHALE 2 PUFFS EVERY 6 HOURS AS NEEDED FOR WHEEZING OR SHORTNESS OF BREATH Qty: 8.5 g, Refills: 12    ALPRAZolam (XANAX) 0.25 MG tablet TAKE 1 TABLET BY MOUTH THREE TIMES DAILY AS NEEDED Qty: 90 tablet, Refills: 5    arformoterol (BROVANA) 15 MCG/2ML NEBU Take 2 mLs (15 mcg total) by nebulization 2 (two) times daily. Qty: 120 mL, Refills: 0    budesonide (PULMICORT) 0.25 MG/2ML nebulizer solution Take 2 mLs (0.25 mg total) by nebulization 2 (two) times daily. Qty: 120 mL, Refills: 0    carboxymethylcellulose (REFRESH PLUS) 0.5 % SOLN Place 1-2 drops into both eyes 2 (two) times daily.    diltiazem (CARDIZEM CD) 300 MG 24 hr capsule Take 1 capsule (300 mg total) by mouth 2 (two) times daily. Qty: 60 capsule, Refills: 0  loratadine (CLARITIN) 10 MG tablet Take 1 tablet (10 mg total) by mouth daily. Qty: 30 tablet, Refills: 0    OXYGEN Inhale 2 L into the lungs. Use @@ Bedtime    potassium chloride SA (K-DUR,KLOR-CON) 20 MEQ tablet Take 1 tablet (20 mEq total) by mouth daily. Qty: 30 tablet, Refills: 0    sotalol (BETAPACE) 80 MG tablet Take 1 tablet (80 mg total) by mouth 2 (two) times daily. Qty: 60 tablet, Refills: 0    tiotropium (SPIRIVA HANDIHALER) 18 MCG inhalation capsule 1 inhale daily Qty: 30  capsule, Refills: 12    benzocaine (ORAJEL) 10 % mucosal gel Use as directed in the mouth or throat 4 (four) times daily as needed for mouth pain (to gums prn). Qty: 5.3 g, Refills: 0      STOP taking these medications     metoprolol tartrate (LOPRESSOR) 25 MG tablet      valACYclovir (VALTREX) 1000 MG tablet      warfarin (COUMADIN) 1 MG tablet      warfarin (COUMADIN) 1 MG tablet        Allergies  Allergen Reactions  . Augmentin [Amoxicillin-Pot Clavulanate] Shortness Of Breath  . Doxycycline Shortness Of Breath  . Nitrofurantoin Shortness Of Breath  . Acyclovir And Related   . Amiodarone Hcl Nausea And Vomiting  . Clarithromycin Other (See Comments)    Crazy feeling, confusion  . Clonazepam Other (See Comments)    Doesn't remember reaction  . Codeine Nausea And Vomiting  . Diltiazem Hcl Nausea And Vomiting  . Fish Oil Nausea And Vomiting  . Flecainide Nausea Only  . Multaq [Dronedarone] Nausea Only  . Rosuvastatin Other (See Comments)    Leg cramps   . Zetia [Ezetimibe] Other (See Comments)    Leg cramps   Discharge Instructions    Diet general    Complete by:  As directed    Increase activity slowly    Complete by:  As directed      Discharge Exam: Filed Weights   10/13/16 0310 10/14/16 0443 10/15/16 0409  Weight: 57.6 kg (126 lb 14.4 oz) 59.7 kg (131 lb 11.2 oz) 58.6 kg (129 lb 3.2 oz)   Vitals:   10/15/16 0934 10/15/16 1200  BP: 97/61 104/68  Pulse:  (!) 110  Resp:    Temp:  98.1 F (36.7 C)   General: Appear in mild distress, no Rash; Oral Mucosa moist. Cardiovascular: S1 and S2 Present, no Murmur, no JVD Respiratory: Bilateral Air entry present and no Crackles, Occasional wheezes Abdomen: Bowel Sound present, Soft and no tenderness Extremities: no Pedal edema, no calf tenderness Neurology: Grossly no focal neuro deficit.  The results of significant diagnostics from this hospitalization (including imaging, microbiology, ancillary and  laboratory) are listed below for reference.    Significant Diagnostic Studies: Dg Chest 2 View  Result Date: 10/08/2016 CLINICAL DATA:  Pt reports shortness of breath for several days; she reports h/o pacemaker insertion earlier this month, HTN, and COPD; former smoker EXAM: CHEST  2 VIEW COMPARISON:  09/23/2016 FINDINGS: Small bilateral pleural effusions, larger on the left. There is associated parenchymal lung base opacity that is likely atelectasis. Pneumonia is not excluded but felt less likely. There is linear opacity in both upper lobes which is new from prior study, consistent with atelectasis. Bilateral interstitial prominence is stable. There is no overt pulmonary edema. No pneumothorax. Cardiac silhouette is mildly enlarged. No mediastinal or hilar masses. Left anterior chest wall sequential pacemaker is stable and well  positioned. Skeletal structures are demineralized but grossly intact. IMPRESSION: 1. There small bilateral pleural effusions, larger on the left. There is additional opacity at the lung bases and in the upper lobes that is likely atelectasis. The infection is possible. These findings are new. 2. Mild cardiomegaly.  No convincing pulmonary edema. Electronically Signed   By: Lajean Manes M.D.   On: 10/08/2016 15:36   Dg Chest 2 View  Result Date: 09/23/2016 CLINICAL DATA:  Hypoxia EXAM: CHEST  2 VIEW COMPARISON:  09/21/2016 FINDINGS: Linear subsegmental atelectasis at the right lung base. There is hyperinflation of the lungs compatible with COPD. Left pacer is unchanged. Heart is normal size. No confluent opacity on the left. No effusions. IMPRESSION: Right basilar subsegmental atelectasis. COPD. Electronically Signed   By: Rolm Baptise M.D.   On: 09/23/2016 15:11   Dg Chest 2 View  Result Date: 09/19/2016 CLINICAL DATA:  Status post pacemaker placement. History of COPD, hypertension, former smoker. EXAM: CHEST  2 VIEW COMPARISON:  Portable chest x-ray of September 18, 2016 and  September 15, 2016 FINDINGS: The lungs are mildly hyperinflated. No left-sided pneumothorax is visible today. The left-sided chest tube overlies the interspace between the left fourth and fifth rib. There is a trace of blunting of the left lateral costophrenic angle. There is hazy density in the left infrahilar region which likely reflects subsegmental atelectasis. The heart is normal in size. There is calcification in the wall of the aortic arch. The ICD electrodes are in stable position with the generator in the left pectoral region. There is stable scarring in the right upper lobe. IMPRESSION: No residual pneumothorax on the left. Hazy increased density in the left infrahilar region may reflect subsegmental atelectasis. No CHF. Thoracic aortic atherosclerosis. Electronically Signed   By: David  Martinique M.D.   On: 09/19/2016 07:29   Ct Angio Chest Pe W Or Wo Contrast  Result Date: 10/10/2016 CLINICAL DATA:  Dyspnea, history of hypertension, pneumonia and COPD. EXAM: CT ANGIOGRAPHY CHEST WITH CONTRAST TECHNIQUE: Multidetector CT imaging of the chest was performed using the standard protocol during bolus administration of intravenous contrast. Multiplanar CT image reconstructions and MIPs were obtained to evaluate the vascular anatomy. CONTRAST:  80 cc Isovue 370 IV COMPARISON:  None. FINDINGS: Cardiovascular: No acute central pulmonary embolus. Aortic atherosclerosis with ectasia of the ascending aorta up to 3.4 cm in diameter. No aortic dissection. Cardiac chambers are normal in size. No pericardial effusion or thickening. Right atrial and right ventricular pacing leads are noted with left-sided pacemaker apparatus overlying the anterior chest wall. Mediastinum/Nodes: Mild thyromegaly with hypodense cystic 1 cm nodule in the left lower pole. Patent trachea and mainstem bronchus with minimal secretions noted in the upper trachea. No supraclavicular, axillary nor mediastinal lymphadenopathy. No hilar  lymphadenopathy. Esophagus is unremarkable for CT appearance. Lungs/Pleura: Bilateral centrilobular emphysema upper lobe predominant with moderate bilateral pleural effusions and compressive atelectasis. Chronic bilateral areas of parenchymal scarring upper lobe predominant. Patchy ill-defined areas of atelectasis and/or scarring are noted bilaterally. Patchy airspace opacities are seen in the upper lobes some which are nodular in appearance. Follow-up to resolution is recommended to exclude pulmonary neoplasm, the largest on the right approximately 14 mm and on the left 10 mm. Bronchiectasis noted to both lower lobes some of which contain inspissated mucus. Upper Abdomen: No acute abnormality. Musculoskeletal: Degenerative change along the dorsal spine. No chest wall abnormality. Review of the MIP images confirms the above findings. IMPRESSION: 1. No large central pulmonary embolus. Aortic atherosclerosis  with ectatic appearing ascending aorta measuring up to 3.4 cm. 2. Bilateral centrilobular emphysema with moderate bilateral pleural effusions and adjacent compressive atelectasis. Areas of linear scarring are noted in the setting centrilobular emphysema, upper lobe predominant. Nodular opacities are also noted in both upper lobes measuring up to 14 mm on the right and 10 mm on the left. Follow-up to complete resolution is recommended to assure stability or clearance and to exclude pulmonary neoplasm as the cause of these nodular densities. 3. 10 mm cystic nodule of the left thyroid gland. No worrisome features noted. Electronically Signed   By: Ashley Royalty M.D.   On: 10/10/2016 23:32   Dg Chest Port 1 View  Result Date: 09/21/2016 CLINICAL DATA:  Pneumothorax continued surveillance. EXAM: PORTABLE CHEST 1 VIEW COMPARISON:  09/20/2016. FINDINGS: Unchanged pacing device with cardiac leads from LEFT subclavian approach. LEFT-sided pneumothorax remains resolved post chest tube removal. No infiltrates or failure.  Cardiac enlargement. No acute osseous findings. Cardiac enlargement. No acute osseous findings. IMPRESSION: No evidence for recurrent pneumothorax. Electronically Signed   By: Staci Righter M.D.   On: 09/21/2016 08:05   Dg Chest Port 1 View  Result Date: 09/20/2016 CLINICAL DATA:  History of pneumothorax, followup EXAM: PORTABLE CHEST 1 VIEW COMPARISON:  Chest x-ray of 09/20/2016 FINDINGS: No active infiltrate or effusion is seen. No pneumothorax is seen. The left chest tube has been removed. Mediastinal and hilar contours are unremarkable. Cardiomegaly is stable and dual lead permanent pacemaker remains. The bones are osteopenic. There are degenerative changes noted in both shoulders. IMPRESSION: 1. Left chest tube removed.  No pneumothorax. 2. No active lung disease. Stable cardiomegaly with permanent pacemaker. Electronically Signed   By: Ivar Drape M.D.   On: 09/20/2016 15:19   Dg Chest Port 1 View  Result Date: 09/20/2016 CLINICAL DATA:  Recent pneumothorax with chest tube placement EXAM: PORTABLE CHEST 1 VIEW COMPARISON:  September 19, 2016 FINDINGS: Chest tube remains on the left with the chest tube tip slightly more laterally positioned compared to 1 day prior. There is no evident pneumothorax currently. There is mild left base atelectasis. There is also atelectasis in the right upper lobe. Lungs elsewhere clear. Heart size and pulmonary vascularity are normal. Pacemaker leads are attached to the right atrium and right ventricle. There is atherosclerotic calcification in the aorta and right carotid artery. No adenopathy. No bone lesions. IMPRESSION: Chest tube remains on the left without pneumothorax. Mild left base and right upper lobe atelectatic change. Lungs elsewhere clear. Stable cardiac silhouette. There is aortic atherosclerosis. There is also calcification in the right carotid artery. Electronically Signed   By: Lowella Grip III M.D.   On: 09/20/2016 07:12   Dg Chest Port 1  View  Result Date: 09/18/2016 CLINICAL DATA:  Follow-up left pneumothorax after chest tube placement. EXAM: PORTABLE CHEST 1 VIEW 2:49 p.m.: COMPARISON:  Portable chest x-ray earlier today 2:13 p.m. and previously. FINDINGS: Left chest tube in place with resolution of the left pneumothorax. Mild atelectasis at the left lung base. Lungs otherwise clear. Cardiac silhouette upper normal in size to slightly enlarged, unchanged. Left subclavian dual lead transvenous pacemaker unchanged and intact. IMPRESSION: 1. Resolution of left pneumothorax after chest tube placement. 2. Mild left basilar atelectasis. No acute cardiopulmonary disease otherwise. Electronically Signed   By: Evangeline Dakin M.D.   On: 09/18/2016 15:00   Dg Chest Port 1 View  Result Date: 09/18/2016 CLINICAL DATA:  Increased shortness of breath, COPD, hypertension EXAM: PORTABLE CHEST 1  VIEW COMPARISON:  Portable exam 1413 hours compared to 09/16/2016 FINDINGS: LEFT subclavian transvenous pacemaker with leads projecting over RIGHT atrium and RIGHT ventricle. Normal heart size. Large LEFT pneumothorax with near complete collapse of the LEFT lung and mediastinal shift LEFT-to-RIGHT compatible with tension pneumothorax. Scarring in RIGHT upper lobe with underlying emphysematous changes. Atherosclerotic calcification aorta. No pleural effusion or acute osseous findings. IMPRESSION: LEFT tension pneumothorax with near complete collapse of LEFT lung and LEFT-to-RIGHT mediastinal shift. New LEFT subclavian sequential pacemaker. Underlying COPD changes. Critical Value/emergent results were called by telephone at the time of interpretation on 09/18/2016 at 1422 hours to Dr. Allegra Lai , who verbally acknowledged these results. Electronically Signed   By: Lavonia Dana M.D.   On: 09/18/2016 14:25   Dg Chest Port 1 View  Result Date: 09/16/2016 CLINICAL DATA:  Shortness of breath, respiratory failure, hypoxia and COPD exacerbation. Atrial fibrillation with  rapid ventricular response. EXAM: PORTABLE CHEST 1 VIEW COMPARISON:  09/15/2016 FINDINGS: The heart size and mediastinal contours are within normal limits. Stable emphysematous lung disease. There is no evidence of pulmonary edema, consolidation, pneumothorax, nodule or pleural fluid. The visualized skeletal structures are unremarkable. IMPRESSION: Stable emphysema. Electronically Signed   By: Aletta Edouard M.D.   On: 09/16/2016 14:11    Microbiology: Recent Results (from the past 240 hour(s))  MRSA PCR Screening     Status: None   Collection Time: 10/12/16 10:22 PM  Result Value Ref Range Status   MRSA by PCR NEGATIVE NEGATIVE Final    Comment:        The GeneXpert MRSA Assay (FDA approved for NASAL specimens only), is one component of a comprehensive MRSA colonization surveillance program. It is not intended to diagnose MRSA infection nor to guide or monitor treatment for MRSA infections.      Labs: CBC:  Recent Labs Lab 10/08/16 1449 10/09/16 0525 10/11/16 0356 10/12/16 0425 10/13/16 0231 10/14/16 0310  WBC 6.2 4.9 5.4 4.6 7.1 6.9  NEUTROABS 4.3  --   --   --   --   --   HGB 9.5* 8.9* 9.9* 10.0* 10.4* 9.5*  HCT 32.4* 30.5* 32.8* 34.5* 37.4 32.8*  MCV 96.4 95.6 93.4 95.8 99.5 96.5  PLT 204 169 181 173 192 [id number]   Basic Metabolic Panel:  Recent Labs Lab 10/11/16 0356 10/12/16 0425 10/12/16 1208 10/13/16 0231 10/14/16 0310  NA 136 140 139 140 137  K 3.5 4.5 4.2 4.6 4.6  CL 82* 83* 82* 80* 79*  CO2 44* >50* 49* >50* 47*  GLUCOSE 112* 108* 112* 158* 124*  BUN 16 21* 20 33* 45*  CREATININE 0.92 0.94 0.89 1.11* 1.08*  CALCIUM 8.7* 9.0 9.2 9.6 9.4  MG 1.5* 1.9  --  2.3 3.0*   Liver Function Tests: No results for input(s): AST, ALT, ALKPHOS, BILITOT, PROT, ALBUMIN in the last 168 hours. No results for input(s): LIPASE, AMYLASE in the last 168 hours. No results for input(s): AMMONIA in the last 168 hours. Cardiac Enzymes:  Recent Labs Lab 10/09/16 0851   TROPONINI <0.03   BNP (last 3 results)  Recent Labs  09/09/16 0934 10/08/16 1449  BNP 257.1* 541.1*   CBG: No results for input(s): GLUCAP in the last 168 hours. Time spent: 30 minutes  Signed:  Berle Mull  [facility]  10/15/2016  , 1:37 PM

## 2016-10-15 NOTE — Progress Notes (Signed)
Claudia [name] has decided to transition her goals of care to more comfort measures. Plan for hospice care per palliative. Electrophysiology to sign off at this time. Please do not hesitate to call back if necessary.  Claudia Imparato Curt Bears, MD 10/15/2016 7:46 AM

## 2016-10-15 NOTE — Progress Notes (Signed)
CSW received notification that pt has bed at [facility]-referral was placed by Romona Curls, Palliative Care NP.   Spoke with [facility] liaison who has completed paperwork with pt family and agreeable to transition to [place]. Request for pt to arrive to facility around 4pm.  CSW arranged transport for pt to transition to [facility] [place]. RN aware.  Alison Murray, MSW, LCSW Clinical Social Worker [phone]

## 2016-11-07 ENCOUNTER — Telehealth: Payer: Self-pay | Admitting: Internal Medicine

## 2016-11-07 NOTE — Telephone Encounter (Signed)
Spoke with Arbie Cookey, hospice nurse, forwarding info that pt passed away this morning.  Arbie Cookey states pt was comfortable.   Forwarding to CY as fyi.  Nothing further needed.

## 2016-11-09 ENCOUNTER — Ambulatory Visit: Payer: Medicare Other | Admitting: Cardiology

## 2016-11-12 DEATH — deceased

## 2016-12-13 ENCOUNTER — Encounter: Payer: Medicare Other | Admitting: Cardiology

## 2017-01-03 ENCOUNTER — Ambulatory Visit: Payer: Medicare Other | Admitting: Internal Medicine

## 2017-08-25 IMAGING — DX DG CHEST 2V
2 series · 2 of 2 positions shown · non-contrast
Comparison: Portable chest x-ray September 18, 2016 and September 15, 2016

CLINICAL DATA: Status post pacemaker placement. History of COPD,
hypertension, former smoker.

EXAM:
CHEST  2 VIEW

[chest lat]
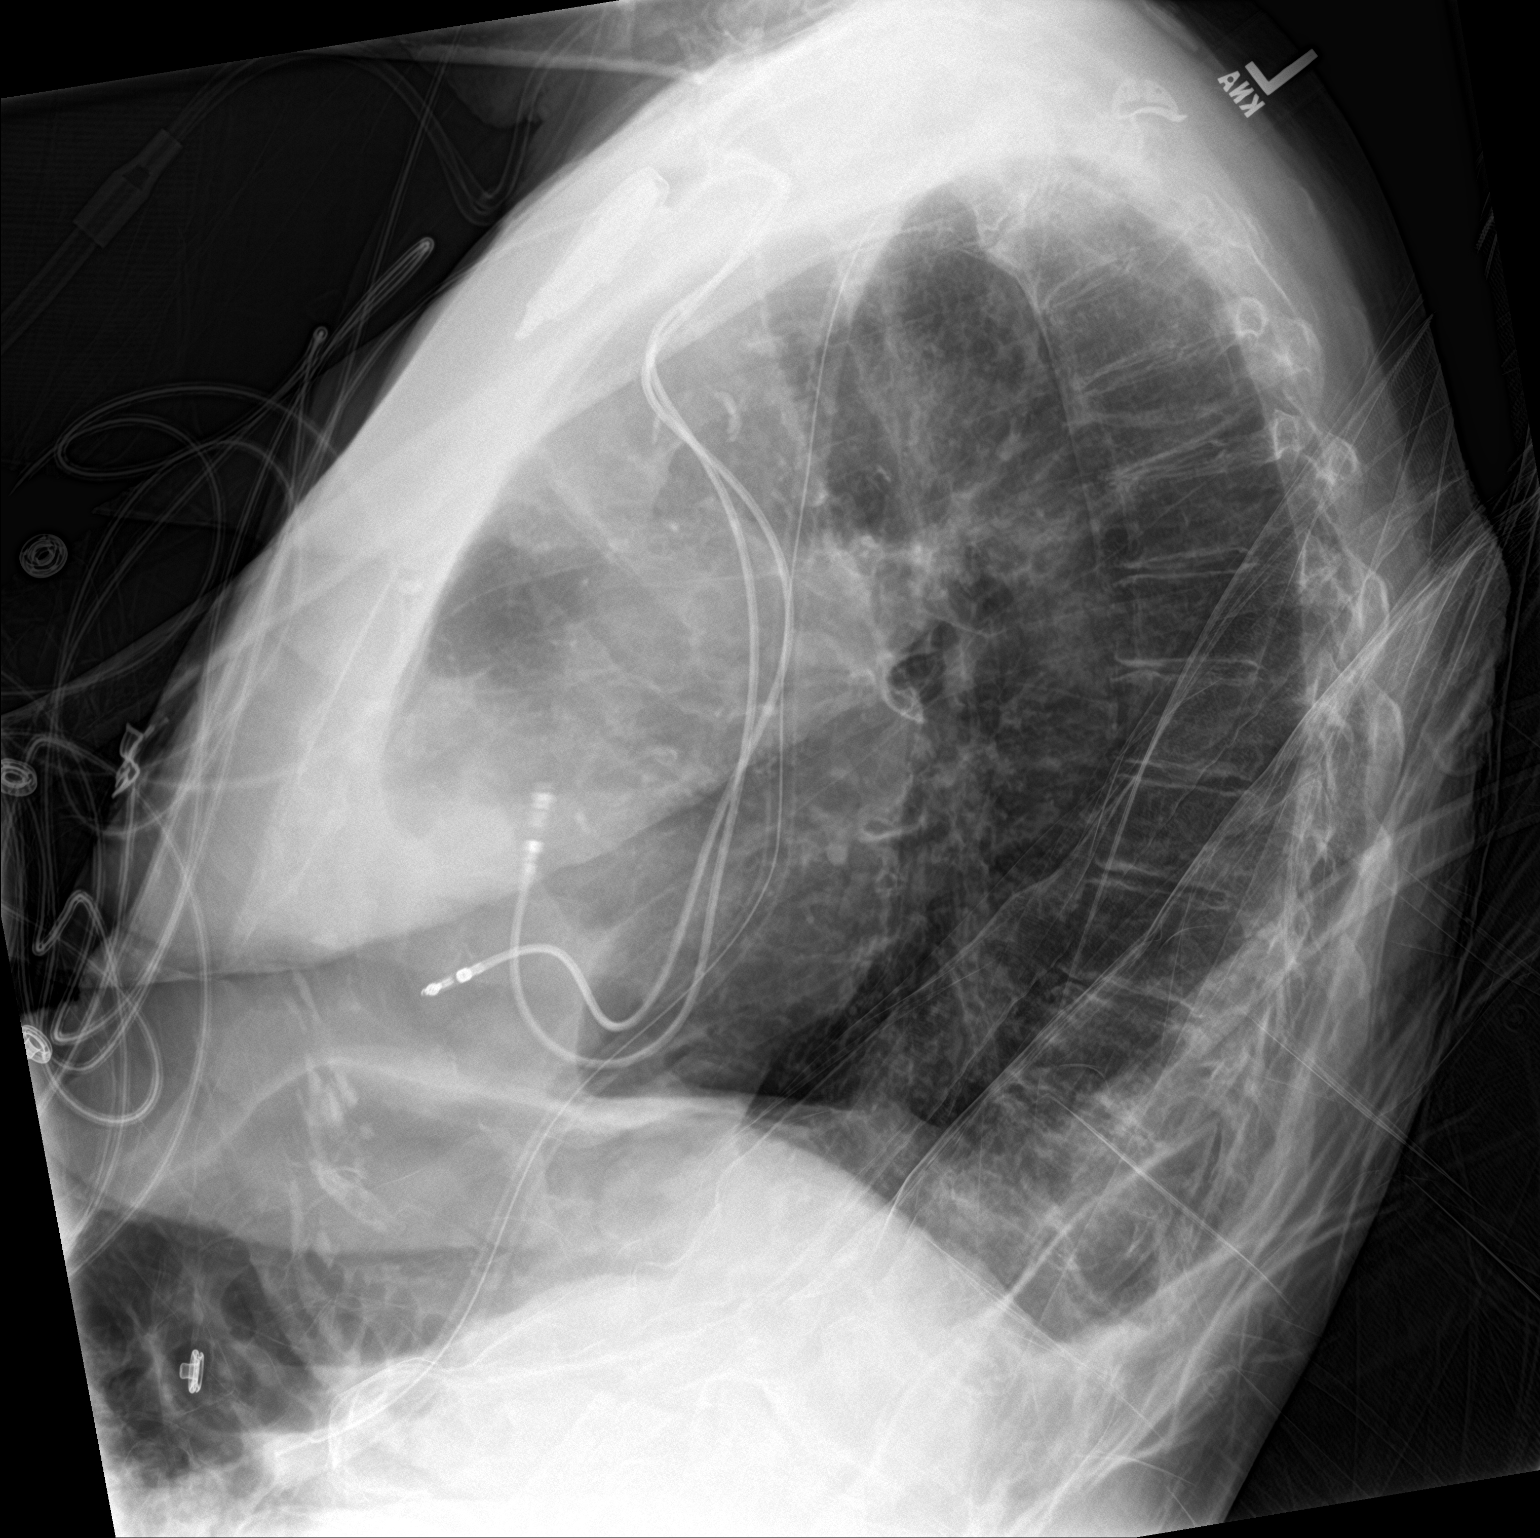

[chest ap]
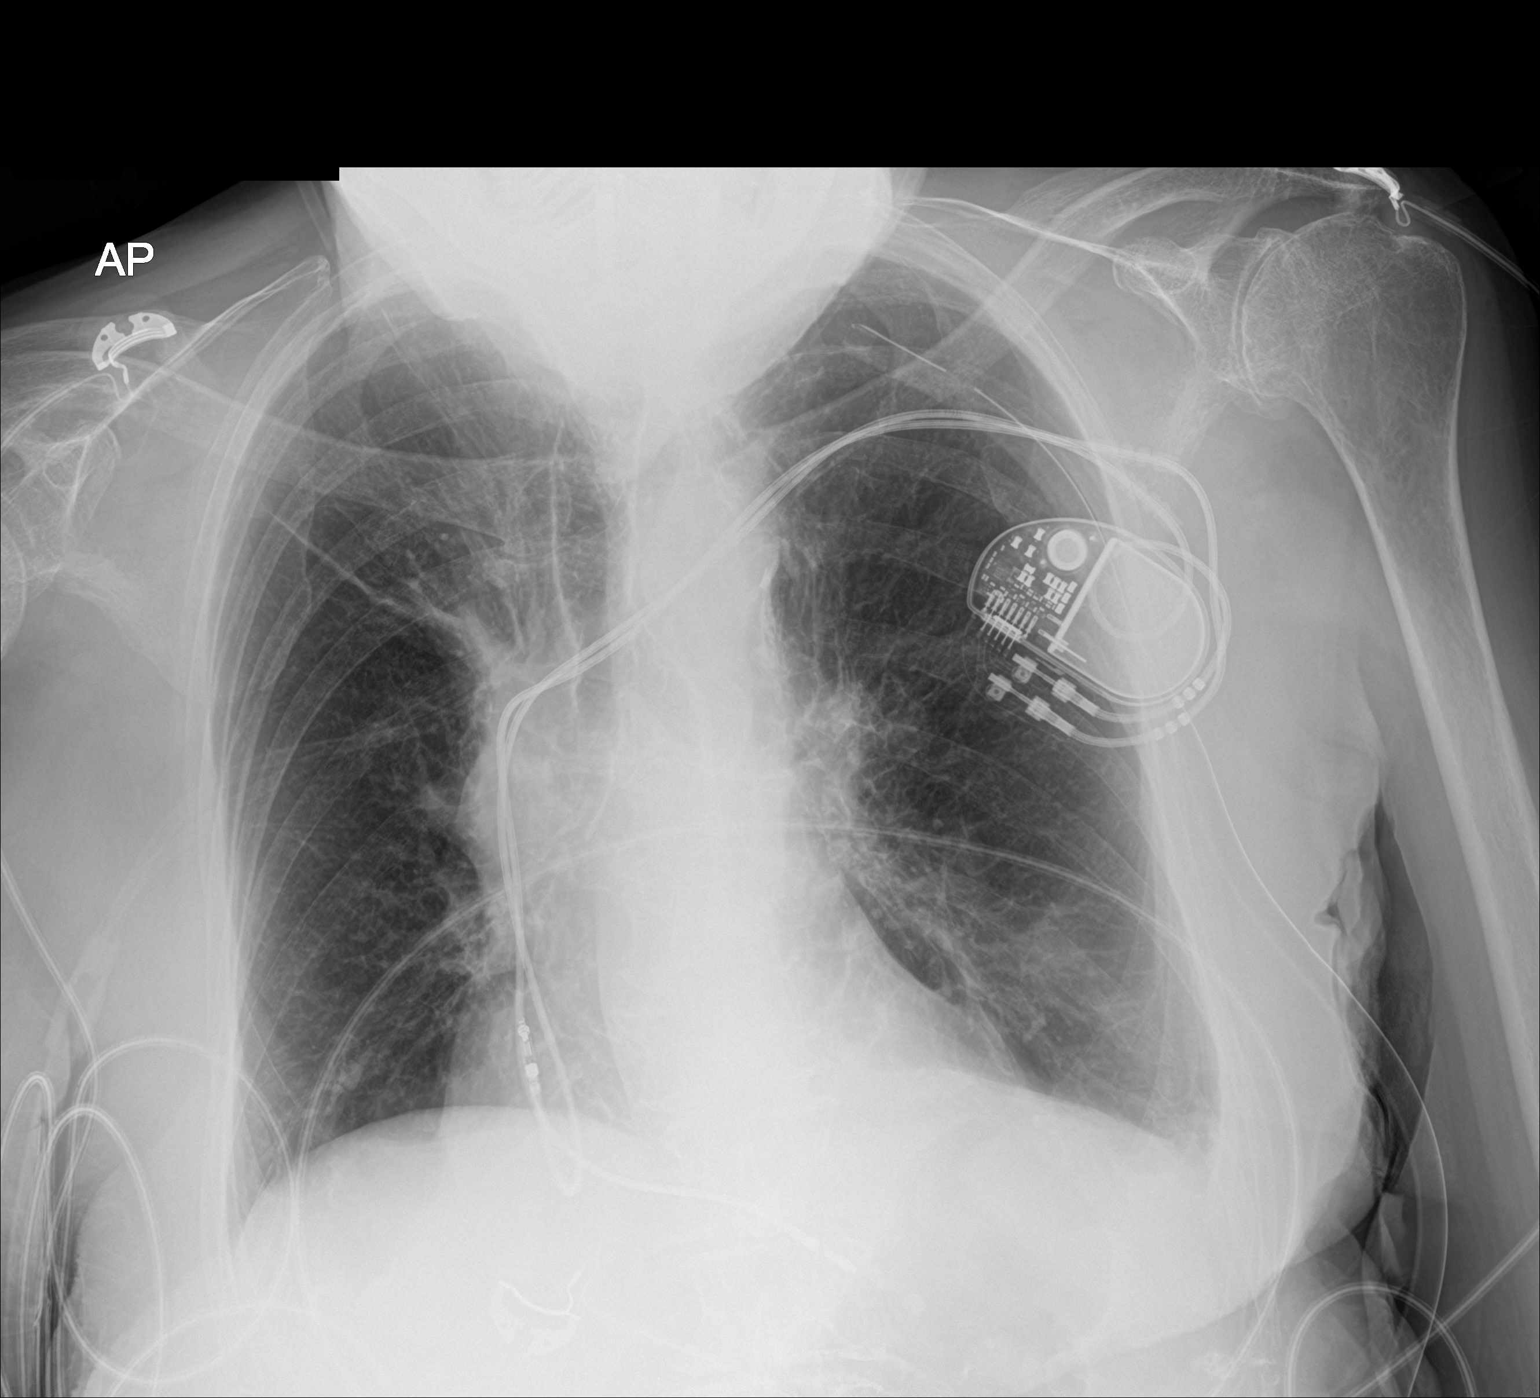

[2 of 2 positions shown; findings below may reference images not displayed]

FINDINGS: The lungs are mildly hyperinflated. No left-sided pneumothorax is
visible today. The left-sided chest tube overlies the interspace
between the left fourth and fifth rib. There is a trace of blunting
of the left lateral costophrenic angle. There is hazy density in the
left infrahilar region which likely reflects subsegmental
atelectasis. The heart is normal in size. There is calcification in
the wall of the aortic arch. The ICD electrodes are in stable
position with the generator in the left pectoral region. There is
stable scarring in the right upper lobe.
IMPRESSION: No residual pneumothorax on the left. Hazy increased density in the
left infrahilar region may reflect subsegmental atelectasis. No CHF.

Thoracic aortic atherosclerosis.

## 2017-08-26 IMAGING — CR DG CHEST 1V PORT
1 series · 1 of 1 positions shown · non-contrast
Comparison: September 19, 2016

CLINICAL DATA: Recent pneumothorax with chest tube placement

EXAM:
PORTABLE CHEST 1 VIEW

[AP]
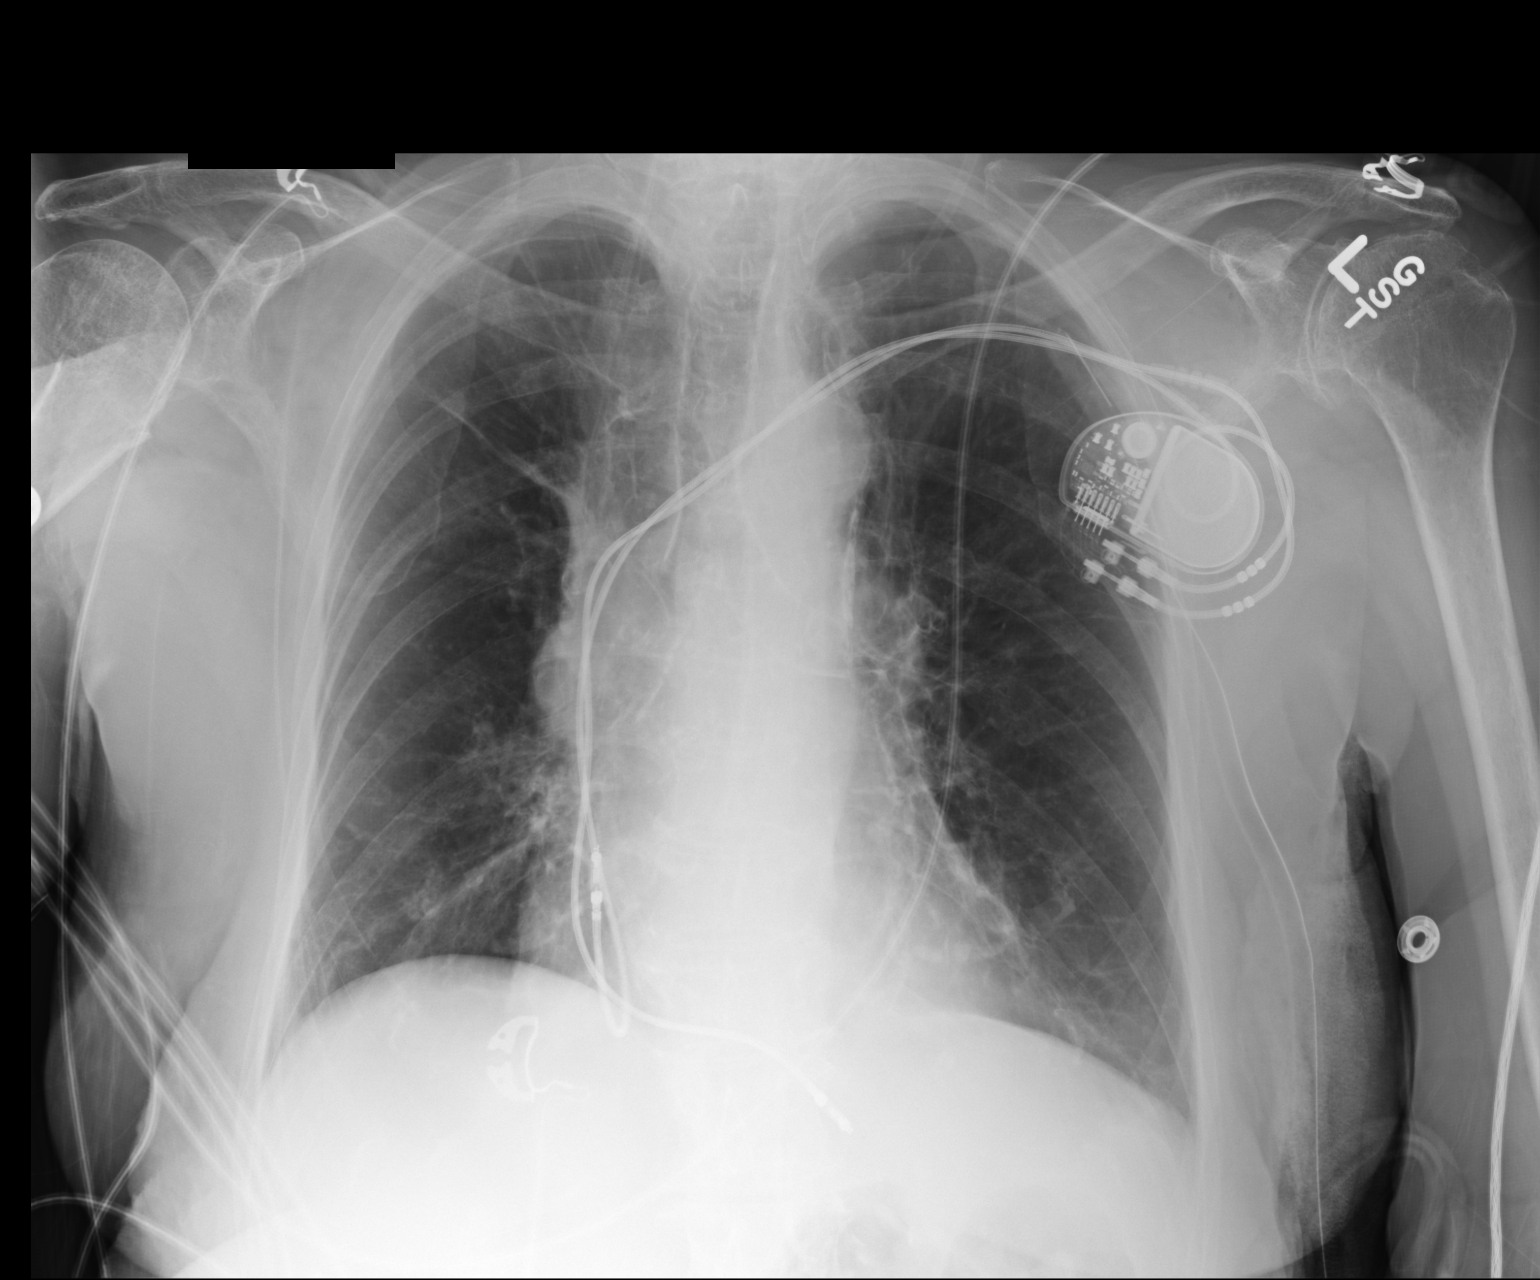

[1 of 1 positions shown; findings below may reference images not displayed]

FINDINGS: Chest tube remains on the left with the chest tube tip slightly more
laterally positioned compared to 1 day prior. There is no evident
pneumothorax currently. There is mild left base atelectasis. There
is also atelectasis in the right upper lobe. Lungs elsewhere clear.
Heart size and pulmonary vascularity are normal. Pacemaker leads are
attached to the right atrium and right ventricle. There is
atherosclerotic calcification in the aorta and right carotid artery.
No adenopathy. No bone lesions.
IMPRESSION: Chest tube remains on the left without pneumothorax. Mild left base
and right upper lobe atelectatic change. Lungs elsewhere clear.
Stable cardiac silhouette. There is aortic atherosclerosis. There is
also calcification in the right carotid artery.

## 2017-08-26 IMAGING — DX DG CHEST 1V PORT
1 series · 1 of 1 positions shown · non-contrast
Comparison: Chest x-ray of 09/20/2016

CLINICAL DATA: History of pneumothorax, followup

EXAM:
PORTABLE CHEST 1 VIEW

[chest ap]
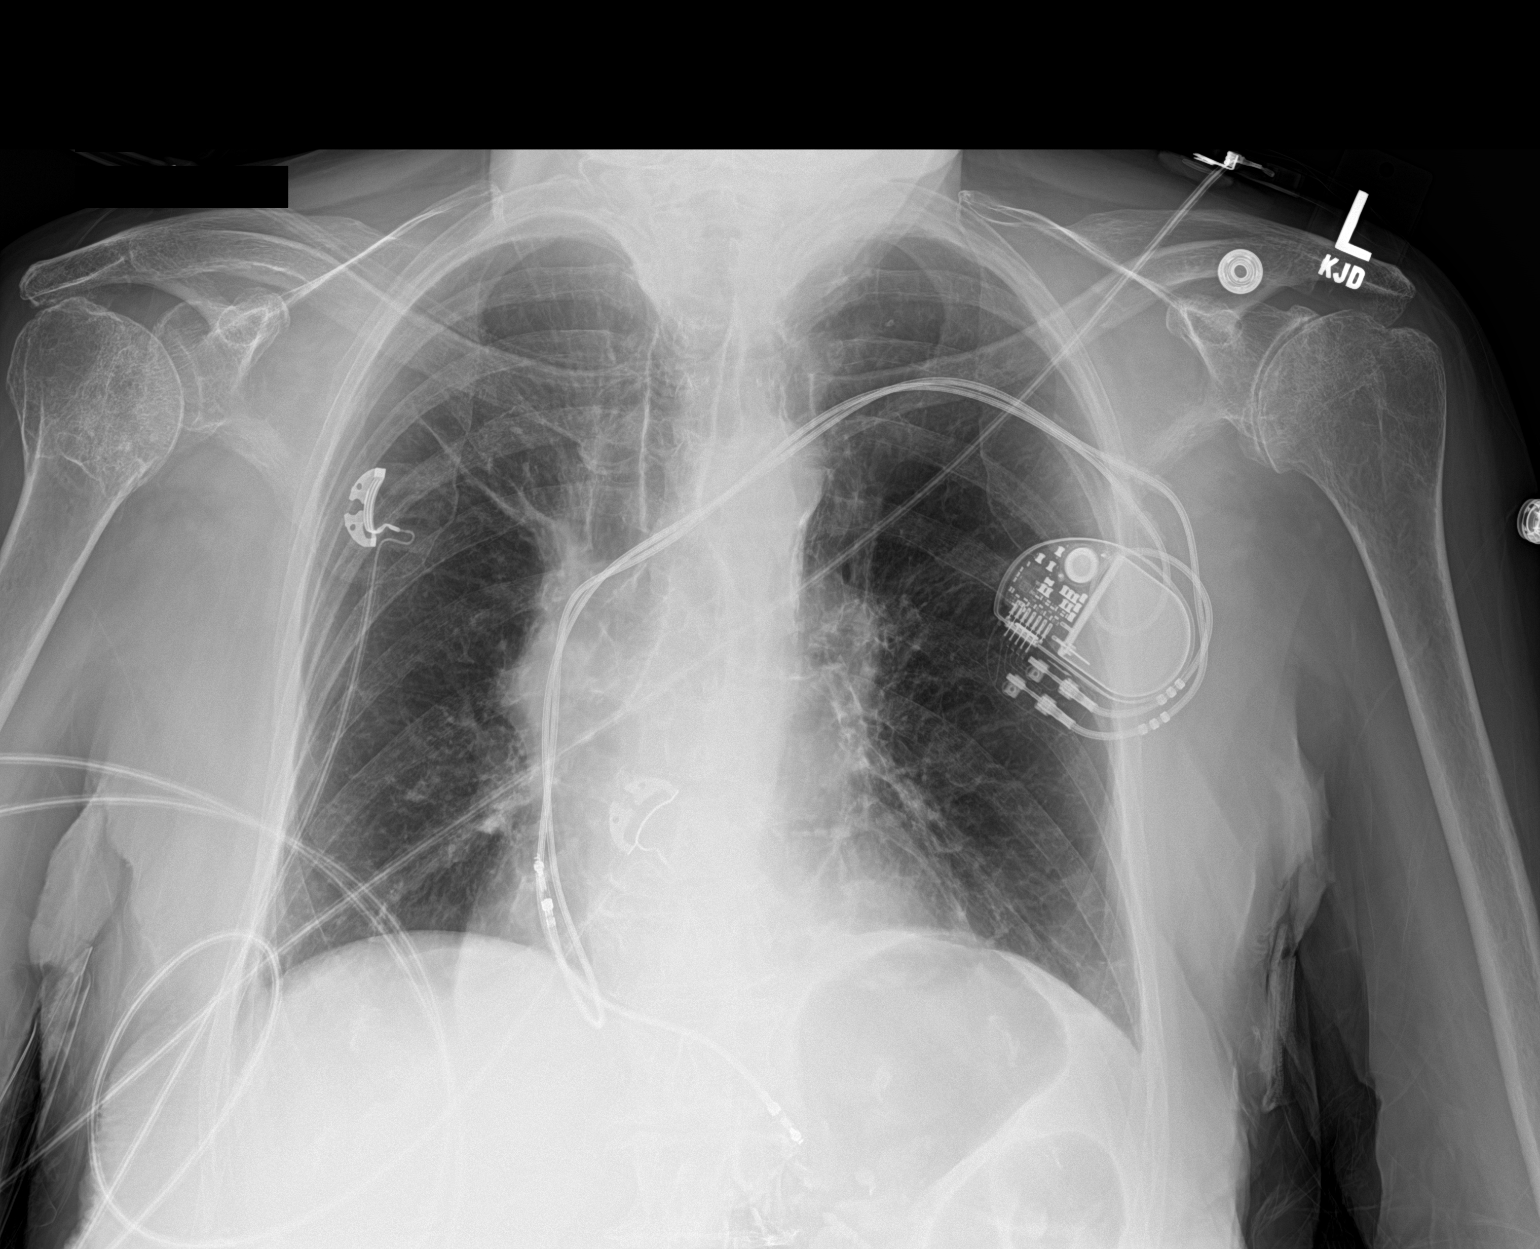

[1 of 1 positions shown; findings below may reference images not displayed]

FINDINGS: No active infiltrate or effusion is seen. No pneumothorax is seen.
The left chest tube has been removed. Mediastinal and hilar contours
are unremarkable. Cardiomegaly is stable and dual lead permanent
pacemaker remains. The bones are osteopenic. There are degenerative
changes noted in both shoulders.
IMPRESSION: 1. Left chest tube removed.  No pneumothorax.
2. No active lung disease. Stable cardiomegaly with permanent
pacemaker.

## 2017-08-29 IMAGING — DX DG CHEST 2V
3 series · 3 of 3 positions shown · non-contrast
Comparison: 09/21/2016

CLINICAL DATA: Hypoxia

EXAM:
CHEST  2 VIEW

[chest lat (1 of 2)]
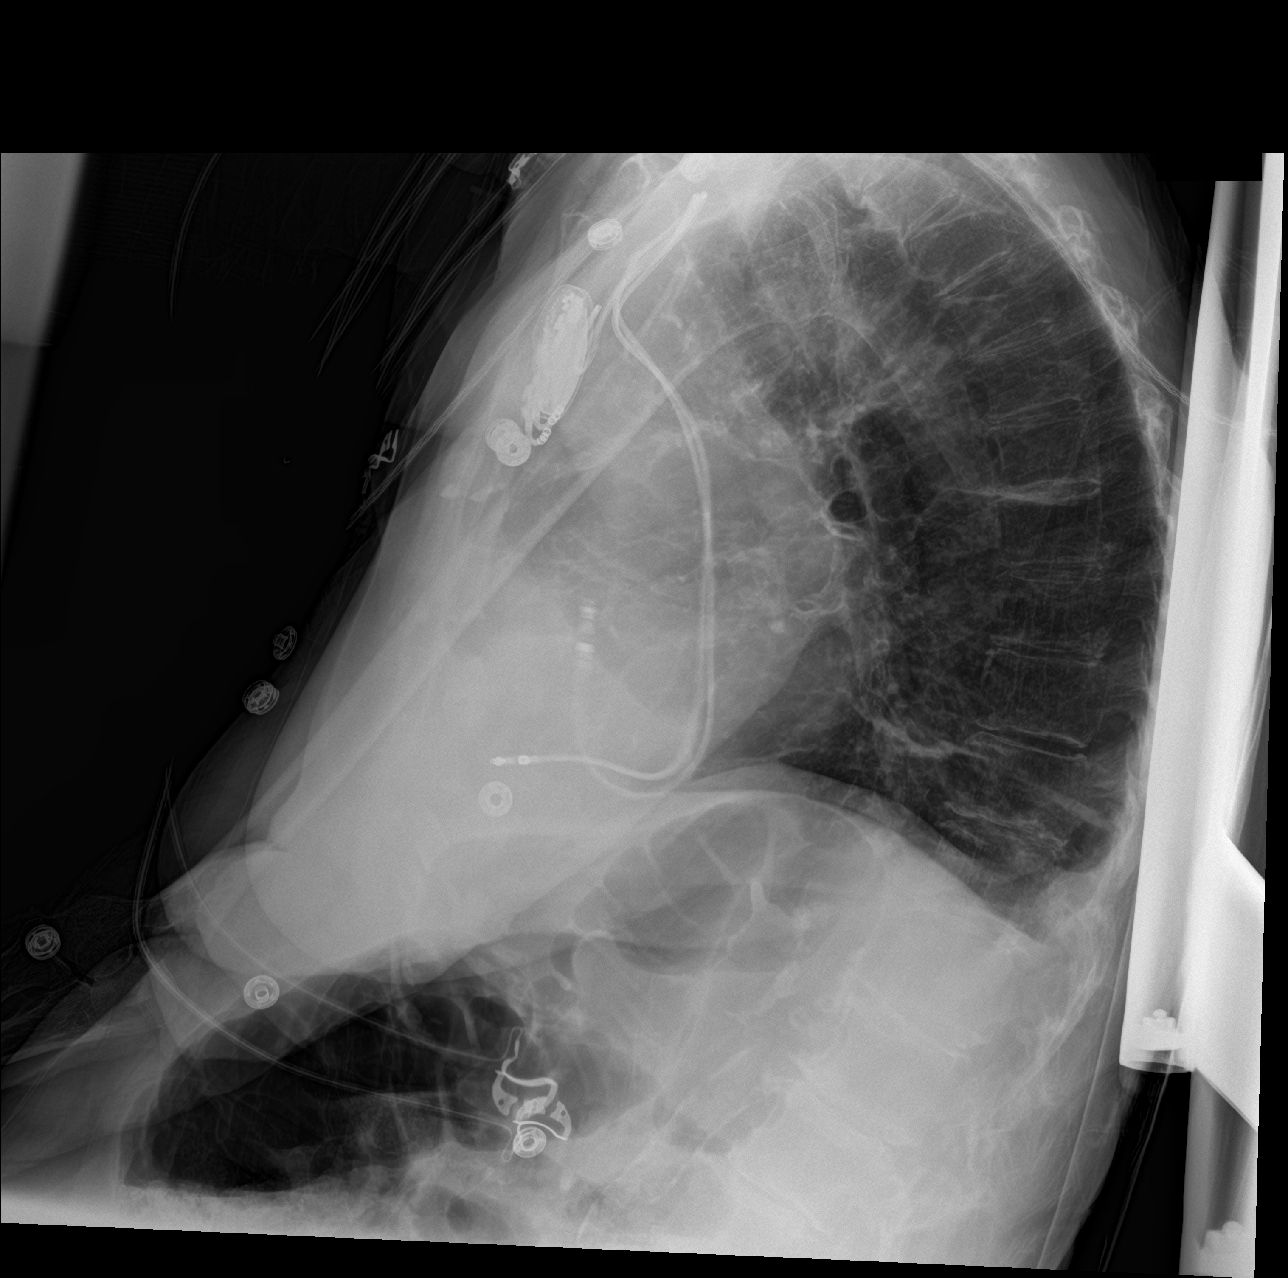

[chest ap]
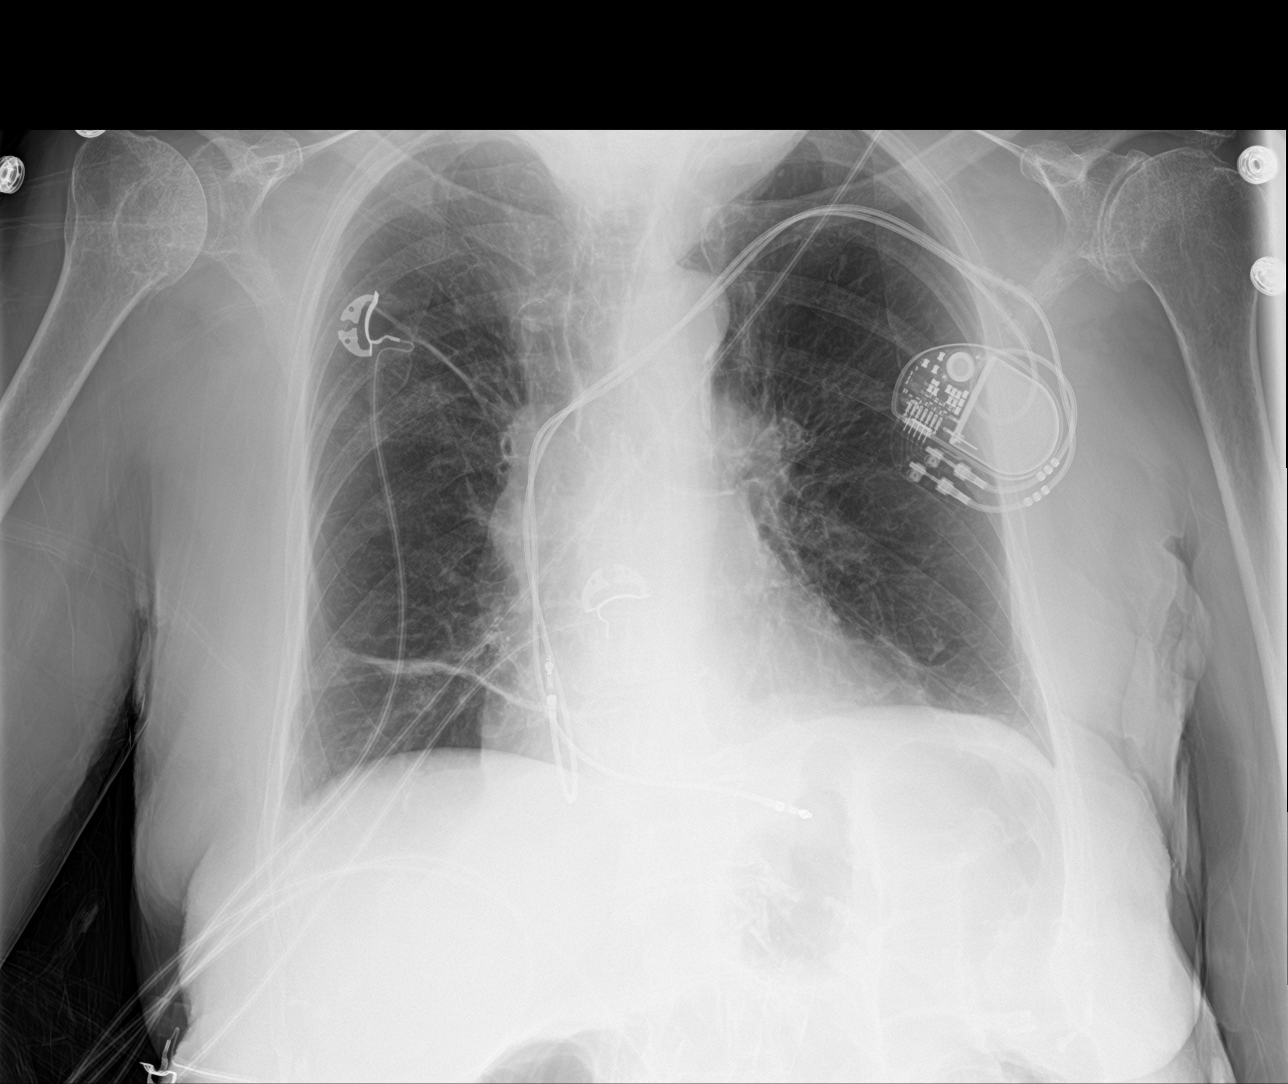

[chest lat (2 of 2)]
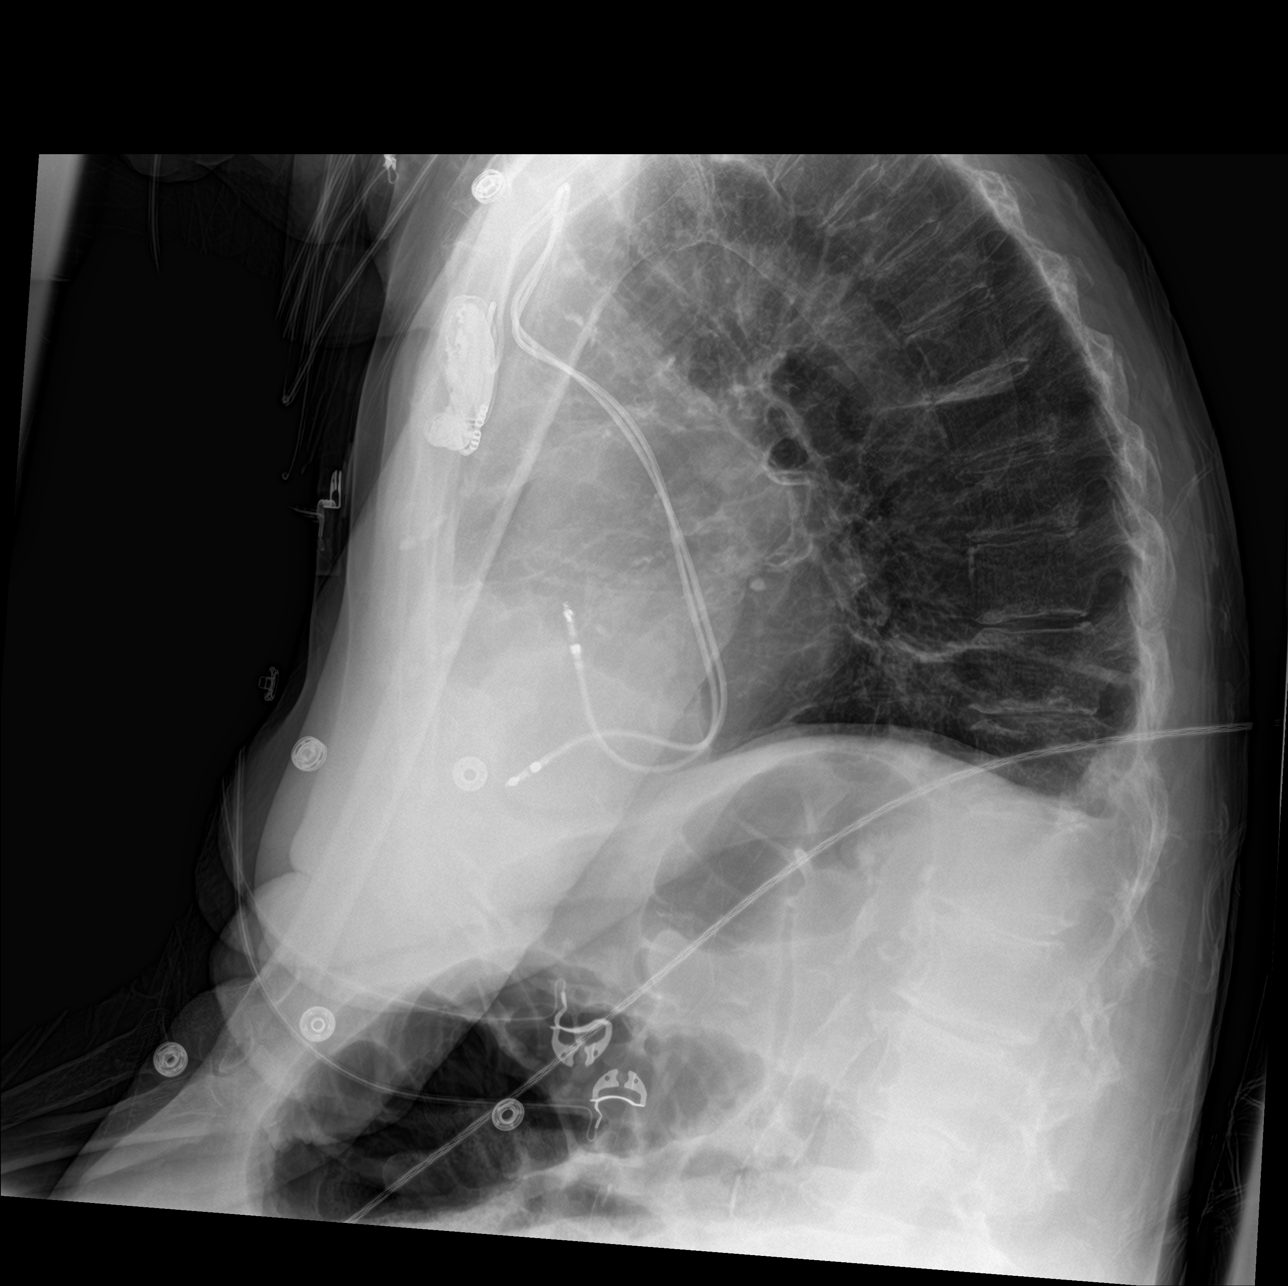

[3 of 3 positions shown; findings below may reference images not displayed]

FINDINGS: Linear subsegmental atelectasis at the right lung base. There is
hyperinflation of the lungs compatible with COPD. Left pacer is
unchanged. Heart is normal size. No confluent opacity on the left.
No effusions.
IMPRESSION: Right basilar subsegmental atelectasis.

COPD.

## 2017-09-06 ENCOUNTER — Ambulatory Visit: Payer: Medicare Other | Admitting: Nurse Practitioner
